# Patient Record
Sex: Female | Born: 1935
Health system: Southern US, Community
[De-identification: ages and names within clinical notes are randomized; demographics above are authoritative.]

## PROBLEM LIST (undated history)

## (undated) DIAGNOSIS — K219 Gastro-esophageal reflux disease without esophagitis: Secondary | ICD-10-CM

## (undated) DIAGNOSIS — R7302 Impaired glucose tolerance (oral): Secondary | ICD-10-CM

## (undated) DIAGNOSIS — E785 Hyperlipidemia, unspecified: Secondary | ICD-10-CM

## (undated) DIAGNOSIS — I639 Cerebral infarction, unspecified: Secondary | ICD-10-CM

## (undated) DIAGNOSIS — M1710 Unilateral primary osteoarthritis, unspecified knee: Secondary | ICD-10-CM

## (undated) DIAGNOSIS — M109 Gout, unspecified: Secondary | ICD-10-CM

## (undated) DIAGNOSIS — E739 Lactose intolerance, unspecified: Secondary | ICD-10-CM

## (undated) DIAGNOSIS — N959 Unspecified menopausal and perimenopausal disorder: Secondary | ICD-10-CM

## (undated) DIAGNOSIS — I10 Essential (primary) hypertension: Secondary | ICD-10-CM

## (undated) DIAGNOSIS — R509 Fever, unspecified: Secondary | ICD-10-CM

## (undated) DIAGNOSIS — Q019 Encephalocele, unspecified: Secondary | ICD-10-CM

## (undated) DIAGNOSIS — E7800 Pure hypercholesterolemia, unspecified: Secondary | ICD-10-CM

## (undated) DIAGNOSIS — J309 Allergic rhinitis, unspecified: Secondary | ICD-10-CM

## (undated) HISTORY — DX: Essential (primary) hypertension: I10

## (undated) HISTORY — DX: Encephalocele, unspecified: Q01.9

## (undated) HISTORY — DX: Pure hypercholesterolemia, unspecified: E78.00

## (undated) HISTORY — DX: Unspecified menopausal and perimenopausal disorder: N95.9

## (undated) HISTORY — DX: Impaired glucose tolerance (oral): R73.02

## (undated) HISTORY — DX: Lactose intolerance, unspecified: E73.9

## (undated) HISTORY — DX: Allergic rhinitis, unspecified: J30.9

## (undated) HISTORY — DX: Fever, unspecified: R50.9

## (undated) HISTORY — DX: Hyperlipidemia, unspecified: E78.5

## (undated) HISTORY — DX: Gastro-esophageal reflux disease without esophagitis: K21.9

## (undated) HISTORY — DX: Gout, unspecified: M10.9

## (undated) HISTORY — DX: Unilateral primary osteoarthritis, unspecified knee: M17.10

---

## 2001-01-01 ENCOUNTER — Inpatient Hospital Stay (HOSPITAL_COMMUNITY): Admission: EM | Admit: 2001-01-01 | Discharge: 2001-01-19 | Payer: Self-pay | Admitting: Emergency Medicine

## 2001-01-01 ENCOUNTER — Encounter: Payer: Self-pay | Admitting: Emergency Medicine

## 2001-01-02 ENCOUNTER — Encounter: Payer: Self-pay | Admitting: Neurology

## 2001-01-03 ENCOUNTER — Encounter: Payer: Self-pay | Admitting: Neurology

## 2001-01-04 ENCOUNTER — Encounter: Payer: Self-pay | Admitting: Critical Care Medicine

## 2001-01-04 ENCOUNTER — Encounter: Payer: Self-pay | Admitting: Neurology

## 2001-01-05 ENCOUNTER — Encounter: Payer: Self-pay | Admitting: Critical Care Medicine

## 2001-01-06 ENCOUNTER — Encounter: Payer: Self-pay | Admitting: Neurology

## 2001-01-07 ENCOUNTER — Encounter: Payer: Self-pay | Admitting: Critical Care Medicine

## 2001-01-08 ENCOUNTER — Encounter: Payer: Self-pay | Admitting: Neurology

## 2001-01-10 ENCOUNTER — Encounter: Payer: Self-pay | Admitting: Neurology

## 2001-01-12 ENCOUNTER — Encounter: Payer: Self-pay | Admitting: Neurology

## 2001-01-13 ENCOUNTER — Encounter (HOSPITAL_COMMUNITY): Payer: Self-pay | Admitting: Dentistry

## 2001-01-13 ENCOUNTER — Encounter: Payer: Self-pay | Admitting: Critical Care Medicine

## 2001-01-14 ENCOUNTER — Encounter: Payer: Self-pay | Admitting: Oral and Maxillofacial Surgery

## 2001-02-03 ENCOUNTER — Ambulatory Visit (HOSPITAL_COMMUNITY): Admission: RE | Admit: 2001-02-03 | Discharge: 2001-02-03 | Payer: Self-pay | Admitting: *Deleted

## 2001-02-03 ENCOUNTER — Encounter: Payer: Self-pay | Admitting: *Deleted

## 2001-04-13 HISTORY — PX: OTHER SURGICAL HISTORY: SHX169

## 2005-01-16 ENCOUNTER — Ambulatory Visit: Payer: Self-pay | Admitting: Internal Medicine

## 2005-01-27 ENCOUNTER — Ambulatory Visit: Payer: Self-pay | Admitting: Internal Medicine

## 2005-02-24 ENCOUNTER — Ambulatory Visit: Payer: Self-pay | Admitting: Internal Medicine

## 2006-01-12 LAB — HM MAMMOGRAPHY: HM Mammogram: NORMAL

## 2006-03-12 ENCOUNTER — Ambulatory Visit: Payer: Self-pay | Admitting: Internal Medicine

## 2006-03-23 ENCOUNTER — Ambulatory Visit: Payer: Self-pay | Admitting: Family Medicine

## 2006-04-14 ENCOUNTER — Ambulatory Visit: Payer: Self-pay | Admitting: Internal Medicine

## 2006-04-14 LAB — CONVERTED CEMR LAB
ALT: 23 units/L (ref 0–40)
AST: 24 units/L (ref 0–37)
Albumin: 3.4 g/dL — ABNORMAL LOW (ref 3.5–5.2)
Alkaline Phosphatase: 62 units/L (ref 39–117)
Bilirubin, Direct: 0.2 mg/dL (ref 0.0–0.3)
Chol/HDL Ratio, serum: 3.9
Cholesterol: 193 mg/dL (ref 0–200)
HDL: 49.2 mg/dL (ref >39.0)
LDL Cholesterol: 128 mg/dL — ABNORMAL HIGH (ref 0–99)
Total Bilirubin: 0.9 mg/dL (ref 0.3–1.2)
Total CK: 96 units/L (ref 7–177)
Total Protein: 7.4 g/dL (ref 6.0–8.3)
Triglyceride fasting, serum: 81 mg/dL (ref 0–149)
VLDL: 16 mg/dL (ref 0–40)

## 2006-05-20 ENCOUNTER — Ambulatory Visit: Payer: Self-pay | Admitting: Internal Medicine

## 2006-06-02 ENCOUNTER — Ambulatory Visit: Payer: Self-pay | Admitting: Internal Medicine

## 2006-06-02 LAB — CONVERTED CEMR LAB
BUN: 13 mg/dL (ref 6–23)
Creatinine, Ser: 0.8 mg/dL (ref 0.4–1.2)

## 2006-06-07 ENCOUNTER — Encounter: Admission: RE | Admit: 2006-06-07 | Discharge: 2006-06-07 | Payer: Self-pay | Admitting: Internal Medicine

## 2006-12-09 ENCOUNTER — Encounter: Payer: Self-pay | Admitting: Internal Medicine

## 2006-12-09 DIAGNOSIS — I10 Essential (primary) hypertension: Secondary | ICD-10-CM

## 2006-12-09 DIAGNOSIS — M109 Gout, unspecified: Secondary | ICD-10-CM

## 2006-12-09 DIAGNOSIS — J309 Allergic rhinitis, unspecified: Secondary | ICD-10-CM | POA: Insufficient documentation

## 2006-12-09 DIAGNOSIS — E7800 Pure hypercholesterolemia, unspecified: Secondary | ICD-10-CM | POA: Insufficient documentation

## 2006-12-09 DIAGNOSIS — K219 Gastro-esophageal reflux disease without esophagitis: Secondary | ICD-10-CM

## 2006-12-09 HISTORY — DX: Gout, unspecified: M10.9

## 2006-12-09 HISTORY — DX: Allergic rhinitis, unspecified: J30.9

## 2006-12-09 HISTORY — DX: Essential (primary) hypertension: I10

## 2006-12-09 HISTORY — DX: Pure hypercholesterolemia, unspecified: E78.00

## 2006-12-09 HISTORY — DX: Gastro-esophageal reflux disease without esophagitis: K21.9

## 2006-12-18 DIAGNOSIS — E785 Hyperlipidemia, unspecified: Secondary | ICD-10-CM

## 2006-12-18 HISTORY — DX: Hyperlipidemia, unspecified: E78.5

## 2007-01-21 ENCOUNTER — Ambulatory Visit: Payer: Self-pay | Admitting: Internal Medicine

## 2007-01-23 ENCOUNTER — Encounter: Payer: Self-pay | Admitting: Internal Medicine

## 2007-01-23 DIAGNOSIS — Q019 Encephalocele, unspecified: Secondary | ICD-10-CM

## 2007-01-23 DIAGNOSIS — IMO0002 Reserved for concepts with insufficient information to code with codable children: Secondary | ICD-10-CM | POA: Insufficient documentation

## 2007-01-23 DIAGNOSIS — M1710 Unilateral primary osteoarthritis, unspecified knee: Secondary | ICD-10-CM

## 2007-01-23 HISTORY — DX: Reserved for concepts with insufficient information to code with codable children: IMO0002

## 2007-01-23 HISTORY — DX: Encephalocele, unspecified: Q01.9

## 2007-02-16 LAB — CONVERTED CEMR LAB
ALT: 17 units/L (ref 0–35)
AST: 20 units/L (ref 0–37)
Albumin: 3.4 g/dL — ABNORMAL LOW (ref 3.5–5.2)
Alkaline Phosphatase: 64 units/L (ref 39–117)
BUN: 13 mg/dL (ref 6–23)
Basophils Absolute: 0.1 10*3/uL (ref 0.0–0.1)
Basophils Relative: 1 % (ref 0.0–1.0)
Bilirubin Urine: NEGATIVE
Bilirubin, Direct: 0.1 mg/dL (ref 0.0–0.3)
CO2: 30 meq/L (ref 19–32)
Calcium: 9.2 mg/dL (ref 8.4–10.5)
Chloride: 106 meq/L (ref 96–112)
Cholesterol: 251 mg/dL (ref 0–200)
Creatinine, Ser: 0.9 mg/dL (ref 0.4–1.2)
Crystals: NEGATIVE
Direct LDL: 177.5 mg/dL
Eosinophils Absolute: 0.3 10*3/uL (ref 0.0–0.6)
Eosinophils Relative: 5 % (ref 0.0–5.0)
GFR calc Af Amer: 78 mL/min
GFR calc non Af Amer: 65 mL/min
Glucose, Bld: 117 mg/dL — ABNORMAL HIGH (ref 70–99)
HCT: 40.4 % (ref 36.0–46.0)
HDL: 48 mg/dL (ref >39.0)
Hemoglobin, Urine: NEGATIVE
Hemoglobin: 13.7 g/dL (ref 12.0–15.0)
Ketones, ur: NEGATIVE mg/dL
Lymphocytes Relative: 35 % (ref 12.0–46.0)
MCHC: 33.8 g/dL (ref 30.0–36.0)
MCV: 86.5 fL (ref 78.0–100.0)
Monocytes Absolute: 0.7 10*3/uL (ref 0.2–0.7)
Monocytes Relative: 11.7 % — ABNORMAL HIGH (ref 3.0–11.0)
Mucus, UA: NEGATIVE
Neutro Abs: 2.9 10*3/uL (ref 1.4–7.7)
Neutrophils Relative %: 47.3 % (ref 43.0–77.0)
Nitrite: NEGATIVE
Platelets: 308 10*3/uL (ref 150–400)
Potassium: 3.7 meq/L (ref 3.5–5.1)
RBC: 4.67 M/uL (ref 3.87–5.11)
RDW: 13.9 % (ref 11.5–14.6)
Sodium: 142 meq/L (ref 135–145)
Specific Gravity, Urine: 1.01 (ref 1.000–1.03)
Squamous Epithelial / LPF: NEGATIVE /lpf
TSH: 2.2 microintl units/mL (ref 0.35–5.50)
Total Bilirubin: 1 mg/dL (ref 0.3–1.2)
Total CHOL/HDL Ratio: 5.2
Total Protein, Urine: NEGATIVE mg/dL
Total Protein: 7.5 g/dL (ref 6.0–8.3)
Triglycerides: 129 mg/dL (ref 0–149)
Urine Glucose: NEGATIVE mg/dL
Urobilinogen, UA: 0.2 (ref 0.0–1.0)
VLDL: 26 mg/dL (ref 0–40)
WBC: 6.1 10*3/uL (ref 4.5–10.5)
pH: 6 (ref 5.0–8.0)

## 2008-01-25 ENCOUNTER — Ambulatory Visit: Payer: Self-pay | Admitting: Internal Medicine

## 2008-01-25 DIAGNOSIS — N959 Unspecified menopausal and perimenopausal disorder: Secondary | ICD-10-CM

## 2008-01-25 HISTORY — DX: Unspecified menopausal and perimenopausal disorder: N95.9

## 2008-04-17 ENCOUNTER — Ambulatory Visit: Payer: Self-pay | Admitting: Internal Medicine

## 2008-04-17 ENCOUNTER — Encounter: Payer: Self-pay | Admitting: Internal Medicine

## 2009-01-24 ENCOUNTER — Ambulatory Visit: Payer: Self-pay | Admitting: Internal Medicine

## 2009-01-24 DIAGNOSIS — E739 Lactose intolerance, unspecified: Secondary | ICD-10-CM

## 2009-01-24 HISTORY — DX: Lactose intolerance, unspecified: E73.9

## 2009-01-24 LAB — HM COLONOSCOPY

## 2009-01-24 LAB — CONVERTED CEMR LAB
ALT: 19 units/L (ref 0–35)
AST: 21 units/L (ref 0–37)
Albumin: 3.4 g/dL — ABNORMAL LOW (ref 3.5–5.2)
Alkaline Phosphatase: 70 units/L (ref 39–117)
BUN: 16 mg/dL (ref 6–23)
Basophils Absolute: 0 10*3/uL (ref 0.0–0.1)
Basophils Relative: 0.5 % (ref 0.0–3.0)
Bilirubin Urine: NEGATIVE
Bilirubin, Direct: 0.1 mg/dL (ref 0.0–0.3)
CO2: 29 meq/L (ref 19–32)
Calcium: 9 mg/dL (ref 8.4–10.5)
Chloride: 102 meq/L (ref 96–112)
Cholesterol: 260 mg/dL — ABNORMAL HIGH (ref 0–200)
Creatinine, Ser: 1 mg/dL (ref 0.4–1.2)
Direct LDL: 187.4 mg/dL
Eosinophils Absolute: 0.3 10*3/uL (ref 0.0–0.7)
Eosinophils Relative: 4.2 % (ref 0.0–5.0)
GFR calc non Af Amer: 68.89 mL/min (ref >60)
Glucose, Bld: 108 mg/dL — ABNORMAL HIGH (ref 70–99)
HCT: 42.6 % (ref 36.0–46.0)
HDL: 45.8 mg/dL (ref >39.00)
Hemoglobin: 13.9 g/dL (ref 12.0–15.0)
Hgb A1c MFr Bld: 6.3 % (ref 4.6–6.5)
Ketones, ur: NEGATIVE mg/dL
Leukocytes, UA: NEGATIVE
Lymphocytes Relative: 34.4 % (ref 12.0–46.0)
Lymphs Abs: 2.1 10*3/uL (ref 0.7–4.0)
MCHC: 32.7 g/dL (ref 30.0–36.0)
MCV: 88.1 fL (ref 78.0–100.0)
Monocytes Absolute: 0.7 10*3/uL (ref 0.1–1.0)
Monocytes Relative: 11 % (ref 3.0–12.0)
Neutro Abs: 2.9 10*3/uL (ref 1.4–7.7)
Neutrophils Relative %: 49.9 % (ref 43.0–77.0)
Nitrite: NEGATIVE
Platelets: 239 10*3/uL (ref 150.0–400.0)
Potassium: 4.3 meq/L (ref 3.5–5.1)
RBC: 4.83 M/uL (ref 3.87–5.11)
RDW: 14 % (ref 11.5–14.6)
Sodium: 139 meq/L (ref 135–145)
Specific Gravity, Urine: 1.025 (ref 1.000–1.030)
TSH: 2.43 microintl units/mL (ref 0.35–5.50)
Total Bilirubin: 1.1 mg/dL (ref 0.3–1.2)
Total CHOL/HDL Ratio: 6
Total Protein, Urine: NEGATIVE mg/dL
Total Protein: 7.3 g/dL (ref 6.0–8.3)
Triglycerides: 148 mg/dL (ref 0.0–149.0)
Uric Acid, Serum: 7.3 mg/dL — ABNORMAL HIGH (ref 2.4–7.0)
Urine Glucose: NEGATIVE mg/dL
Urobilinogen, UA: 0.2 (ref 0.0–1.0)
VLDL: 29.6 mg/dL (ref 0.0–40.0)
WBC: 6 10*3/uL (ref 4.5–10.5)
pH: 5.5 (ref 5.0–8.0)

## 2009-06-17 ENCOUNTER — Encounter (INDEPENDENT_AMBULATORY_CARE_PROVIDER_SITE_OTHER): Payer: Self-pay | Admitting: *Deleted

## 2009-07-26 ENCOUNTER — Ambulatory Visit: Payer: Self-pay | Admitting: Internal Medicine

## 2009-07-26 LAB — CONVERTED CEMR LAB
BUN: 17 mg/dL (ref 6–23)
CO2: 30 meq/L (ref 19–32)
Calcium: 9.6 mg/dL (ref 8.4–10.5)
Chloride: 102 meq/L (ref 96–112)
Cholesterol: 253 mg/dL — ABNORMAL HIGH (ref 0–200)
Creatinine, Ser: 1 mg/dL (ref 0.4–1.2)
Direct LDL: 173.5 mg/dL
GFR calc non Af Amer: 68.8 mL/min (ref >60)
Glucose, Bld: 108 mg/dL — ABNORMAL HIGH (ref 70–99)
HDL: 53.6 mg/dL (ref >39.00)
Hgb A1c MFr Bld: 6.2 % (ref 4.6–6.5)
Potassium: 4 meq/L (ref 3.5–5.1)
Sodium: 141 meq/L (ref 135–145)
Total CHOL/HDL Ratio: 5
Triglycerides: 153 mg/dL — ABNORMAL HIGH (ref 0.0–149.0)
VLDL: 30.6 mg/dL (ref 0.0–40.0)

## 2010-01-21 ENCOUNTER — Ambulatory Visit: Payer: Self-pay | Admitting: Internal Medicine

## 2010-01-28 ENCOUNTER — Ambulatory Visit: Payer: Self-pay | Admitting: Internal Medicine

## 2010-01-28 DIAGNOSIS — R509 Fever, unspecified: Secondary | ICD-10-CM | POA: Insufficient documentation

## 2010-01-28 HISTORY — DX: Fever, unspecified: R50.9

## 2010-01-28 LAB — CONVERTED CEMR LAB
ALT: 13 units/L (ref 0–35)
AST: 17 units/L (ref 0–37)
Albumin: 3.3 g/dL — ABNORMAL LOW (ref 3.5–5.2)
Alkaline Phosphatase: 59 units/L (ref 39–117)
BUN: 14 mg/dL (ref 6–23)
Basophils Absolute: 0.1 10*3/uL (ref 0.0–0.1)
Basophils Relative: 1.1 % (ref 0.0–3.0)
Bilirubin Urine: NEGATIVE
Bilirubin, Direct: 0.1 mg/dL (ref 0.0–0.3)
CO2: 31 meq/L (ref 19–32)
Calcium: 9.1 mg/dL (ref 8.4–10.5)
Chloride: 105 meq/L (ref 96–112)
Cholesterol: 245 mg/dL — ABNORMAL HIGH (ref 0–200)
Creatinine, Ser: 0.8 mg/dL (ref 0.4–1.2)
Direct LDL: 166.2 mg/dL
Eosinophils Absolute: 0.3 10*3/uL (ref 0.0–0.7)
Eosinophils Relative: 4.7 % (ref 0.0–5.0)
GFR calc non Af Amer: 85.19 mL/min (ref >60)
Glucose, Bld: 106 mg/dL — ABNORMAL HIGH (ref 70–99)
HCT: 39.7 % (ref 36.0–46.0)
HDL: 54 mg/dL (ref >39.00)
Hemoglobin, Urine: NEGATIVE
Hemoglobin: 13 g/dL (ref 12.0–15.0)
Hgb A1c MFr Bld: 6.5 % (ref 4.6–6.5)
Ketones, ur: NEGATIVE mg/dL
Lymphocytes Relative: 34.8 % (ref 12.0–46.0)
Lymphs Abs: 2 10*3/uL (ref 0.7–4.0)
MCHC: 32.7 g/dL (ref 30.0–36.0)
MCV: 87.8 fL (ref 78.0–100.0)
Monocytes Absolute: 0.7 10*3/uL (ref 0.1–1.0)
Monocytes Relative: 12.6 % — ABNORMAL HIGH (ref 3.0–12.0)
Neutro Abs: 2.7 10*3/uL (ref 1.4–7.7)
Neutrophils Relative %: 46.8 % (ref 43.0–77.0)
Nitrite: NEGATIVE
Platelets: 264 10*3/uL (ref 150.0–400.0)
Potassium: 3.8 meq/L (ref 3.5–5.1)
RBC: 4.52 M/uL (ref 3.87–5.11)
RDW: 15.5 % — ABNORMAL HIGH (ref 11.5–14.6)
Sodium: 143 meq/L (ref 135–145)
Specific Gravity, Urine: 1.02 (ref 1.000–1.030)
TSH: 1.84 microintl units/mL (ref 0.35–5.50)
Total Bilirubin: 0.9 mg/dL (ref 0.3–1.2)
Total CHOL/HDL Ratio: 5
Total Protein, Urine: NEGATIVE mg/dL
Total Protein: 6.6 g/dL (ref 6.0–8.3)
Triglycerides: 131 mg/dL (ref 0.0–149.0)
Urine Glucose: NEGATIVE mg/dL
Urobilinogen, UA: 0.2 (ref 0.0–1.0)
VLDL: 26.2 mg/dL (ref 0.0–40.0)
WBC: 5.8 10*3/uL (ref 4.5–10.5)
pH: 6 (ref 5.0–8.0)

## 2010-05-04 ENCOUNTER — Encounter: Payer: Self-pay | Admitting: Internal Medicine

## 2010-05-11 LAB — CONVERTED CEMR LAB
ALT: 21 units/L (ref 0–35)
AST: 23 units/L (ref 0–37)
Albumin: 3.6 g/dL (ref 3.5–5.2)
Alkaline Phosphatase: 69 units/L (ref 39–117)
BUN: 14 mg/dL (ref 6–23)
Basophils Absolute: 0 10*3/uL (ref 0.0–0.1)
Basophils Relative: 0.8 % (ref 0.0–3.0)
Bilirubin Urine: NEGATIVE
Bilirubin, Direct: 0.2 mg/dL (ref 0.0–0.3)
CO2: 30 meq/L (ref 19–32)
Calcium: 9.4 mg/dL (ref 8.4–10.5)
Chloride: 103 meq/L (ref 96–112)
Cholesterol: 249 mg/dL (ref 0–200)
Creatinine, Ser: 0.9 mg/dL (ref 0.4–1.2)
Crystals: NEGATIVE
Direct LDL: 153 mg/dL
Eosinophils Absolute: 0.2 10*3/uL (ref 0.0–0.7)
Eosinophils Relative: 4.1 % (ref 0.0–5.0)
GFR calc Af Amer: 78 mL/min
GFR calc non Af Amer: 65 mL/min
Glucose, Bld: 114 mg/dL — ABNORMAL HIGH (ref 70–99)
HCT: 43.6 % (ref 36.0–46.0)
HDL: 47 mg/dL (ref >39.0)
Hemoglobin, Urine: NEGATIVE
Hemoglobin: 14.7 g/dL (ref 12.0–15.0)
Ketones, ur: NEGATIVE mg/dL
Lymphocytes Relative: 38.5 % (ref 12.0–46.0)
MCHC: 33.7 g/dL (ref 30.0–36.0)
MCV: 87.9 fL (ref 78.0–100.0)
Monocytes Absolute: 0.7 10*3/uL (ref 0.1–1.0)
Monocytes Relative: 11 % (ref 3.0–12.0)
Mucus, UA: NEGATIVE
Neutro Abs: 2.8 10*3/uL (ref 1.4–7.7)
Neutrophils Relative %: 45.6 % (ref 43.0–77.0)
Nitrite: NEGATIVE
Platelets: 267 10*3/uL (ref 150–400)
Potassium: 3.7 meq/L (ref 3.5–5.1)
RBC / HPF: NONE SEEN
RBC: 4.96 M/uL (ref 3.87–5.11)
RDW: 13.9 % (ref 11.5–14.6)
Sodium: 140 meq/L (ref 135–145)
Specific Gravity, Urine: 1.01 (ref 1.000–1.03)
Squamous Epithelial / LPF: NEGATIVE /lpf
TSH: 2.09 microintl units/mL (ref 0.35–5.50)
Total Bilirubin: 1 mg/dL (ref 0.3–1.2)
Total CHOL/HDL Ratio: 5.3
Total Protein, Urine: NEGATIVE mg/dL
Total Protein: 7.9 g/dL (ref 6.0–8.3)
Triglycerides: 147 mg/dL (ref 0–149)
Urine Glucose: NEGATIVE mg/dL
Urobilinogen, UA: 0.2 (ref 0.0–1.0)
VLDL: 29 mg/dL (ref 0–40)
Vit D, 1,25-Dihydroxy: 19 — ABNORMAL LOW (ref 30–89)
WBC: 6 10*3/uL (ref 4.5–10.5)
pH: 5 (ref 5.0–8.0)

## 2010-05-15 NOTE — Assessment & Plan Note (Signed)
Vital Signs:  Patient Profile:   77 Years Old Female Weight:      266 pounds Temp:     99.4 degrees F Pulse rate:   69 / minute BP sitting:   158 / 88  Vitals Entered By: Corwin Levins MD (January 23, 2007 9:54 PM)                 Preventive Care Screening  Mammogram:    Date:  01/12/2006    Next Due:  01/2007    Results:  normal     History of Present Illness: BP at home 133-140 per pt, only occasionally higher; not taking the pravachol - ?why; c/o left knee swelling and mild discomfort, o/w doing well  Current Allergies (reviewed today): ! CODEINE ! MEVACOR ! ZOCOR Updated/Current Medications (including changes made in today's visit):  ECOTRIN 325 MG  TBEC (ASPIRIN) 1 by mouth qd BENICAR 40 MG  TABS (OLMESARTAN MEDOXOMIL) 1 by mouth bid CARVEDILOL 25 MG  TABS (CARVEDILOL) 1 by mouth bid FUROSEMIDE 40 MG  TABS (FUROSEMIDE) 1 by mouth qd PRAVACHOL 40 MG  TABS (PRAVASTATIN SODIUM) 2 by mouth qd   Past Medical History:    Reviewed history from 12/09/2006 and no changes required:       Allergic rhinitis       GERD       Gout       Hypertension       Hyperlipidemia       knee djd  Past Surgical History:    Reviewed history and no changes required:       s/p anterior encephalocele repair and craniotomy - 2003   Family History:    Reviewed history and no changes required:       Family History Hypertension  Social History:    Reviewed history and no changes required:       Never Smoked       Alcohol use-no   Risk Factors:  Tobacco use:  never Alcohol use:  no  Mammogram History:     Date of Last Mammogram:  01/12/2006    Results:  normal    Review of Systems  The patient denies anorexia, fever, weight loss, vision loss, decreased hearing, hoarseness, chest pain, syncope, dyspnea on exhertion, peripheral edema, prolonged cough, hemoptysis, abdominal pain, melena, hematochezia, severe indigestion/heartburn, hematuria, incontinence, genital sores,  muscle weakness, suspicious skin lesions, transient blindness, difficulty walking, depression, unusual weight change, abnormal bleeding, enlarged lymph nodes, angioedema, breast masses, and testicular masses.     Physical Exam  General:     overweight-appearing.   Head:     Normocephalic and atraumatic without obvious abnormalities. No apparent alopecia or balding. Eyes:     No corneal or conjunctival inflammation noted. EOMI. Perrla. Ears:     External ear exam shows no significant lesions or deformities.  Otoscopic examination reveals clear canals, tympanic membranes are intact bilaterally without bulging, retraction, inflammation or discharge. Hearing is grossly normal bilaterally. Nose:     External nasal examination shows no deformity or inflammation. Nasal mucosa are pink and moist without lesions or exudates. Mouth:     Oral mucosa and oropharynx without lesions or exudates.  Teeth in good repair. Neck:     No deformities, masses, or tenderness noted. Lungs:     Normal respiratory effort, chest expands symmetrically. Lungs are clear to auscultation, no crackles or wheezes. Heart:     Normal rate and regular rhythm. S1 and S2 normal  without gallop, murmur, click, rub or other extra sounds. Abdomen:     Bowel sounds positive,abdomen soft and non-tender without masses, organomegaly or hernias noted. Msk:     left knee small effusion and creitus noted Extremities:     No clubbing, cyanosis, edema, or deformity noted with normal full range of motion of all joints.   Neurologic:     No cranial nerve deficits noted. Station and gait are normal. Plantar reflexes are down-going bilaterally. DTRs are symmetrical throughout. Sensory, motor and coordinative functions appear intact. Skin:     Intact without suspicious lesions or rashes    Impression & Recommendations:  Problem # 1:  PREVENTIVE HEALTH CARE (ICD-V70.0) Assessment: New overall doing well, up to date, counseled on  routine health matters, will check routine labs today  Problem # 2:  HYPERTENSION (ICD-401.9) Assessment: Unchanged  Her updated medication list for this problem includes:    Benicar 40 Mg Tabs (Olmesartan medoxomil) .Marland Kitchen... 1 by mouth bid    Carvedilol 25 Mg Tabs (Carvedilol) .Marland Kitchen... 1 by mouth bid    Furosemide 40 Mg Tabs (Furosemide) .Marland Kitchen... 1 by mouth once daily  cont same meds  Her updated medication list for this problem includes:    Benicar 40 Mg Tabs (Olmesartan medoxomil) .Marland Kitchen... 1 by mouth bid    Carvedilol 25 Mg Tabs (Carvedilol) .Marland Kitchen... 1 by mouth bid    Furosemide 40 Mg Tabs (Furosemide) .Marland Kitchen... 1 by mouth qd   Problem # 3:  DEGENERATIVE JOINT DISEASE, LEFT KNEE (ICD-715.96)  Her updated medication list for this problem includes:    Ecotrin 325 Mg Tbec (Aspirin) .Marland Kitchen... 1 by mouth once daily  cont meds as is, tylenol prn  Her updated medication list for this problem includes:    Ecotrin 325 Mg Tbec (Aspirin) .Marland Kitchen... 1 by mouth qd   Complete Medication List: 1)  Ecotrin 325 Mg Tbec (Aspirin) .Marland Kitchen.. 1 by mouth qd 2)  Benicar 40 Mg Tabs (Olmesartan medoxomil) .Marland Kitchen.. 1 by mouth bid 3)  Carvedilol 25 Mg Tabs (Carvedilol) .Marland Kitchen.. 1 by mouth bid 4)  Furosemide 40 Mg Tabs (Furosemide) .Marland Kitchen.. 1 by mouth qd 5)  Pravachol 40 Mg Tabs (Pravastatin sodium) .... 2 by mouth qd   Patient Instructions: 1)  Please schedule a follow-up appointment in 4 months.    ]

## 2010-05-15 NOTE — Assessment & Plan Note (Signed)
Summary: FU---$50---STC   Vital Signs:  Patient Profile:   77 Years Old Female Weight:      251 pounds Temp:     99.8 degrees F oral Pulse rate:   80 / minute BP sitting:   140 / 82  (left arm) Cuff size:   large  Vitals Entered By: Payton Spark CMA (January 25, 2008 9:23 AM)                 Preventive Care Screening  Bone Density:    Date:  03/25/2006    Next Due:  03/2008    Results:  abnormal std dev  Colonoscopy:    Date:  01/25/2008    Results:  declined  Last Pneumovax:    Next Due:  03/2011  Last Flu Shot:    Date:  01/25/2008    Results:  Fluvax Non-MCR  Last Tetanus Booster:    Date:  04/14/1995    Results:  given    Chief Complaint:  f/u.  History of Present Illness: wont use her cane, but no falls or other complaints, not taking the statin as much lately    Updated Prior Medication List: ECOTRIN 325 MG  TBEC (ASPIRIN) 1 by mouth qd BENICAR 40 MG  TABS (OLMESARTAN MEDOXOMIL) 1 by mouth once daily CARVEDILOL 25 MG  TABS (CARVEDILOL) 1 by mouth two times a day FUROSEMIDE 40 MG  TABS (FUROSEMIDE) 1 by mouth once daily PRAVACHOL 40 MG  TABS (PRAVASTATIN SODIUM) 2 by mouth once daily  Current Allergies (reviewed today): ! CODEINE ! MEVACOR ! ZOCOR  Past Medical History:    Reviewed history from 01/23/2007 and no changes required:       Allergic rhinitis       GERD       Gout       Hypertension       Hyperlipidemia       knee djd  Past Surgical History:    Reviewed history from 01/23/2007 and no changes required:       s/p anterior encephalocele repair and craniotomy - 2003   Family History:    Reviewed history from 01/23/2007 and no changes required:       Family History Hypertension  Social History:    Reviewed history from 01/23/2007 and no changes required:       Never Smoked       Alcohol use-no       Married       7 children       work - Marketing executive stylist   Risk Factors:  Colonoscopy History:     Date of  Last Colonoscopy:  01/25/2008    Results:  declined   Review of Systems  The patient denies anorexia, fever, weight loss, weight gain, vision loss, decreased hearing, hoarseness, chest pain, syncope, dyspnea on exertion, peripheral edema, prolonged cough, headaches, hemoptysis, abdominal pain, melena, hematochezia, severe indigestion/heartburn, hematuria, incontinence, muscle weakness, suspicious skin lesions, transient blindness, difficulty walking, depression, unusual weight change, abnormal bleeding, enlarged lymph nodes, angioedema, and breast masses.         all otherwise negative  - just puts up with the knee pains - overall stable ; conts to decline the colonoscopy   Physical Exam  General:     alert and overweight-appearing.   Head:     Normocephalic and atraumatic without obvious abnormalities. No apparent alopecia or balding. Eyes:     No corneal or conjunctival inflammation noted. EOMI. Perrla. Ears:  External ear exam shows no significant lesions or deformities.  Otoscopic examination reveals clear canals, tympanic membranes are intact bilaterally without bulging, retraction, inflammation or discharge. Hearing is grossly normal bilaterally. Nose:     External nasal examination shows no deformity or inflammation. Nasal mucosa are pink and moist without lesions or exudates. Mouth:     Oral mucosa and oropharynx without lesions or exudates.  Teeth in good repair. Neck:     No deformities, masses, or tenderness noted. Lungs:     Normal respiratory effort, chest expands symmetrically. Lungs are clear to auscultation, no crackles or wheezes. Heart:     Normal rate and regular rhythm. S1 and S2 normal without gallop, murmur, click, rub or other extra sounds. Abdomen:     Bowel sounds positive,abdomen soft and non-tender without masses, organomegaly or hernias noted. Msk:     no joint tenderness and no joint swelling.  , although there is knee crepitus Extremities:     no  edema, no ulcers  Neurologic:     cranial nerves II-XII intact and strength normal in all extremities.      Impression & Recommendations:  Problem # 1:  Preventive Health Care (ICD-V70.0) Overall doing well, up to date, counseled on routine health concerns for screening and prevention, immunizations up to date or declined, labs ordered, ecg reviewed, to get flu shot today, to schedule DXA wheh she is due, declines colonoscopy again   Orders: EKG w/ Interpretation (93000) T-Vitamin D (25-Hydroxy) (16[address]-60454) TLB-Udip ONLY (81003-UDIP) TLB-BMP (Basic Metabolic Panel-BMET) (80048-METABOL) TLB-CBC Platelet - w/Differential (85025-CBCD) TLB-Hepatic/Liver Function Pnl (80076-HEPATIC) TLB-Lipid Panel (80061-LIPID) TLB-TSH (Thyroid Stimulating Hormone) (84443-TSH) TLB-Udip w/ Micro (81001-URINE)    Problem # 2:  HYPERTENSION (ICD-401.9)  Her updated medication list for this problem includes:    Benicar 40 Mg Tabs (Olmesartan medoxomil) .Marland Kitchen... 1 by mouth once daily    Carvedilol 25 Mg Tabs (Carvedilol) .Marland Kitchen... 1 by mouth two times a day    Furosemide 40 Mg Tabs (Furosemide) .Marland Kitchen... 1 by mouth once daily  BP today: 140/82 Prior BP: 158/88 (01/23/2007)  Labs Reviewed: Creat: 0.9 (01/21/2007) Chol: 251 (01/21/2007)   HDL: 48.0 (01/21/2007)   LDL: DEL (01/21/2007)   TG: 129 (01/21/2007) stable overall by hx and exam, ok to continue meds/tx as is   Problem # 3:  HYPERLIPIDEMIA (ICD-272.4)  Her updated medication list for this problem includes:    Pravachol 40 Mg Tabs (Pravastatin sodium) .Marland Kitchen... 2 by mouth once daily check lipids  - goal ldl   Complete Medication List: 1)  Ecotrin 325 Mg Tbec (Aspirin) .Marland Kitchen.. 1 by mouth qd 2)  Benicar 40 Mg Tabs (Olmesartan medoxomil) .Marland Kitchen.. 1 by mouth once daily 3)  Carvedilol 25 Mg Tabs (Carvedilol) .Marland Kitchen.. 1 by mouth two times a day 4)  Furosemide 40 Mg Tabs (Furosemide) .Marland Kitchen.. 1 by mouth once daily 5)  Pravachol 40 Mg Tabs (Pravastatin sodium) .... 2 by  mouth once daily  Other Orders: Influenza Vaccine NON MCR (09811) T-Bone Densitometry [phone])   Patient Instructions: 1)  your EKG today was good 2)  you received the flu shot today 3)  Please go to the Lab in the basement for your blood tests today 4)  Continue all medications that you may have been taking previously (they were sent to the pharmacy on the computer) 5)  please schedule the next bone density test for Jan 2010 when you are due 6)  please call for your yearly mammogram 7)  Please schedule a follow-up  appointment in 1 year or sooner if needed   Prescriptions: PRAVACHOL 40 MG  TABS (PRAVASTATIN SODIUM) 2 by mouth once daily  #60 x 11   Entered and Authorized by:   Corwin Levins MD   Signed by:   Corwin Levins MD on 01/25/2008   Method used:   Electronically to        [facility] (retail)       [address]       [place], [place]  [address]       Ph: [phone]       Fax: [phone]   RxID:   [id number] FUROSEMIDE 40 MG  TABS (FUROSEMIDE) 1 by mouth once daily  #30 x 11   Entered and Authorized by:   Corwin Levins MD   Signed by:   Corwin Levins MD on 01/25/2008   Method used:   Electronically to        [facility] (retail)       [address]       [place], [place]  [address]       Ph: [phone]       Fax: [phone]   RxID:   [id number] CARVEDILOL 25 MG  TABS (CARVEDILOL) 1 by mouth two times a day  #60 x 11   Entered and Authorized by:   Corwin Levins MD   Signed by:   Corwin Levins MD on 01/25/2008   Method used:   Electronically to        [facility] (retail)       [address]       [place], [place]  [address]       Ph: [phone]       Fax: [phone]   RxID:   [id number] BENICAR 40 MG  TABS (OLMESARTAN MEDOXOMIL) 1 by mouth once daily  #30 x 11   Entered and Authorized by:   Corwin Levins MD   Signed by:   Corwin Levins MD on 01/25/2008    Method used:   Electronically to        [facility] (retail)       [address]       [place], [place]  [address]       Ph: [phone]       Fax: [phone]   RxID:   [id number]  ]  Influenza Vaccine    Vaccine Type: Fluvax Non-MCR    Site: left deltoid    Mfr: GlaxoSmithKline    Dose: 0.5 ml    Route: IM    Given by: Payton Spark CMA    Exp. Date: 10/10/2008    Lot #: [id number]    VIS given: 11/04/06 version given January 25, 2008.  Flu Vaccine Consent Questions    Do you have a history of severe allergic reactions to this vaccine? no    Any prior history of allergic reactions to egg and/or gelatin? no    Do you have a sensitivity to the preservative Thimersol? no    Do you have a past history of Guillan-Barre Syndrome? no    Do you currently have an acute febrile illness? no    Have you ever had a severe reaction  to latex? no    Vaccine information given and explained to patient? yes    Are you currently pregnant? no

## 2010-05-15 NOTE — Assessment & Plan Note (Signed)
Summary: FU Natale Milch  $50   Vital Signs:  Patient profile:   77 year old female Height:      60 inches Weight:      250.25 pounds BMI:     49.05 O2 Sat:      96 % Temp:     97.3 degrees F oral Pulse rate:   81 / minute BP sitting:   201 / 102  (left arm) Cuff size:   large  Preventive Care Screening  Bone Density:    Date:  04/17/2008    Next Due:  04/2010    Results:  abnormal std dev  Colonoscopy:    Date:  01/24/2009    Results:  declined  CC: F/U   CC:  F/U.  History of Present Illness: here with more stress lately - soninlaw just told her recently her daughter is suicidal with hx of bipolar (now on suicide watch at [facility] health);  wt overall stable;  BP recently has been < 140/90;  Pt denies CP, sob, doe, wheezing, orthopnea, pnd, worsening LE edema, palps, dizziness or syncope Pt denies new neuro symptoms such as headache, facial or extremity weakness  .  Overall doing well, good complaince iwth meds.    Problems Prior to Update: 1)  Glucose Intolerance  (ICD-271.3) 2)  Menopausal Disorder  (ICD-627.9) 3)  Preventive Health Care  (ICD-V70.0) 4)  Preventive Health Care  (ICD-V70.0) 5)  Encephalocele  (ICD-742.0) 6)  Degenerative Joint Disease, Left Knee  (ICD-715.96) 7)  Hyperlipidemia  (ICD-272.4) 8)  Hypertension  (ICD-401.9) 9)  Gout  (ICD-274.9) 10)  Gerd  (ICD-530.81) 11)  Allergic Rhinitis  (ICD-477.9) 12)  Hypercholesterolemia  (ICD-272.0)  Medications Prior to Update: 1)  Ecotrin 325 Mg  Tbec (Aspirin) .Marland Kitchen.. 1 By Mouth Qd 2)  Benicar 40 Mg  Tabs (Olmesartan Medoxomil) .Marland Kitchen.. 1 By Mouth Once Daily 3)  Carvedilol 25 Mg  Tabs (Carvedilol) .Marland Kitchen.. 1 By Mouth Two Times A Day 4)  Furosemide 40 Mg  Tabs (Furosemide) .Marland Kitchen.. 1 By Mouth Once Daily 5)  Pravachol 40 Mg  Tabs (Pravastatin Sodium) .... 2 By Mouth Once Daily  Current Medications (verified): 1)  Ecotrin 325 Mg  Tbec (Aspirin) .Marland Kitchen.. 1 By Mouth Qd 2)  Benazepril Hcl 40 Mg Tabs (Benazepril Hcl) .Marland Kitchen.. 1po Once  Daily 3)  Carvedilol 25 Mg  Tabs (Carvedilol) .Marland Kitchen.. 1 By Mouth Two Times A Day 4)  Furosemide 40 Mg  Tabs (Furosemide) .Marland Kitchen.. 1 By Mouth Once Daily 5)  Pravachol 40 Mg  Tabs (Pravastatin Sodium) .... 2 By Mouth Once Daily 6)  Amlodipine Besylate 5 Mg Tabs (Amlodipine Besylate) .Marland Kitchen.. 1 By Mouth Once Daily  Allergies (verified): 1)  ! Codeine 2)  ! Mevacor 3)  ! 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  Risk Factors: Smoking Status: never (01/23/2007)  Past Medical History: Allergic rhinitis GERD Gout Hypertension Hyperlipidemia knee djd glucose intolerance  Review of Systems  The patient denies anorexia, fever, weight loss, weight gain, vision loss, decreased hearing, hoarseness, chest pain, syncope, dyspnea on exertion, peripheral edema, prolonged cough, headaches, hemoptysis, abdominal pain, melena, hematochezia, severe indigestion/heartburn, hematuria, incontinence, muscle weakness, suspicious skin lesions, transient blindness, difficulty walking, depression, unusual weight change, abnormal bleeding, and enlarged lymph nodes.  all otherwise negative per pt  Physical Exam  General:  alert and overweight-appearing.   Head:  normocephalic and atraumatic.   Eyes:  vision grossly intact, pupils equal, and pupils round.   Ears:  R ear normal and L ear normal.   Nose:  no external deformity and no nasal discharge.   Mouth:  no gingival abnormalities and pharynx pink and moist.   Neck:  supple and no masses.   Lungs:  normal respiratory effort and normal breath sounds.   Heart:  normal rate and regular rhythm.   Abdomen:  soft, non-tender, and normal bowel sounds.   Msk:  no joint  tenderness and no joint swelling.   Extremities:  no edema, no erythema  Neurologic:  cranial nerves II-XII intact and strength normal in all extremities.     Impression & Recommendations:  Problem # 1:  Preventive Health Care (ICD-V70.0) Overall doing well, up to date, counseled on routine health concerns for screening and prevention, immunizations up to date or declined, labs ordered  Orders: TLB-BMP (Basic Metabolic Panel-BMET) (80048-METABOL) TLB-CBC Platelet - w/Differential (85025-CBCD) TLB-Hepatic/Liver Function Pnl (80076-HEPATIC) TLB-Lipid Panel (80061-LIPID) TLB-TSH (Thyroid Stimulating Hormone) (84443-TSH) TLB-Udip ONLY (81003-UDIP)  Problem # 2:  GLUCOSE INTOLERANCE (ICD-271.3) asympt - to check a1c Orders: TLB-A1C / Hgb A1C (Glycohemoglobin) (83036-A1C)  Problem # 3:  HYPERTENSION (ICD-401.9)  Her updated medication list for this problem includes:    Benazepril Hcl 40 Mg Tabs (Benazepril hcl) .Marland Kitchen... 1po once daily    Carvedilol 25 Mg Tabs (Carvedilol) .Marland Kitchen... 1 by mouth two times a day    Furosemide 40 Mg Tabs (Furosemide) .Marland Kitchen... 1 by mouth once daily    Amlodipine Besylate 5 Mg Tabs (Amlodipine besylate) .Marland Kitchen... 1 by mouth once daily BP at home on talking to the pt again, is about 150 she says - ok to add admlodipine 5 once daily   Complete Medication List: 1)  Ecotrin 325 Mg Tbec (Aspirin) .Marland Kitchen.. 1 by mouth qd 2)  Benazepril Hcl 40 Mg Tabs (Benazepril hcl) .Marland Kitchen.. 1po once daily 3)  Carvedilol 25 Mg Tabs (Carvedilol) .Marland Kitchen.. 1 by mouth two times a day 4)  Furosemide 40 Mg Tabs (Furosemide) .Marland Kitchen.. 1 by mouth once daily 5)  Pravachol 40 Mg Tabs (Pravastatin sodium) .... 2 by mouth once daily 6)  Amlodipine Besylate 5 Mg Tabs (Amlodipine besylate) .Marland Kitchen.. 1 by mouth once daily  Other Orders: Flu Vaccine 59yrs + (30865) Administration Flu vaccine - MCR (G0008) TD Toxoids IM 7 YR + (78469) Admin 1st Vaccine (62952) Admin of Any Addtl Vaccine (84132) TLB-Uric Acid, Blood  (84550-URIC)  Patient Instructions: 1)  you had the flu shot today, and the tetanus shot today 2)  plesae call for your yearly mammogram 3)  Please go to the Lab in the basement for your blood and/or urine tests today  4)  start the amlodipine 5 mg per day 5)  Continue all previous medications as before this visit  6)  Please schedule a follow-up appointment in 6 months with : 7)  BMP prior to visit, ICD-9: 790.2 8)  Lipid Panel prior to visit, ICD-9: 9)  HbgA1C prior to visit, ICD-9: 10)  Check your Blood Pressure regularly. If it is above 140/90: you should make an appointment. Prescriptions: AMLODIPINE BESYLATE 5 MG TABS (AMLODIPINE BESYLATE) 1 by mouth once daily  #90 x 3   Entered and Authorized by:   Corwin Levins MD   Signed by:   Corwin Levins MD on 01/24/2009  Method used:   [facility] to        [facility]       [address]       [place], [place]  [address]       Ph: [phone]       Fax: [phone]   RxID:   [id number] AMLODIPINE BESYLATE 5 MG TABS (AMLODIPINE BESYLATE) 1 by mouth once daily  #30 x 11   Entered and Authorized by:   Corwin Levins MD   Signed by:   Corwin Levins MD on 01/24/2009   Method used:   Print then Give to Patient   RxID:   [id number] PRAVACHOL 40 MG  TABS (PRAVASTATIN SODIUM) 2 by mouth once daily  #180 x 3   Entered and Authorized by:   Corwin Levins MD   Signed by:   Corwin Levins MD on 01/24/2009   Method used:   Electronically to        [facility] (retail)       [address]       [place], [place]  [address]       Ph: [phone]       Fax: [phone]   RxID:   [id number] FUROSEMIDE 40 MG  TABS (FUROSEMIDE) 1 by mouth once daily  #90 x 3   Entered and Authorized by:   Corwin Levins MD   Signed by:   Corwin Levins MD on 01/24/2009   Method used:   Electronically to        [facility] (retail)       [address]       [place], [place]  [address]       Ph: [phone]       Fax: [phone]   RxID:   [id number] CARVEDILOL 25 MG  TABS (CARVEDILOL) 1 by mouth two times a day  #180 x 3   Entered and Authorized by:   Corwin Levins MD   Signed by:   Corwin Levins MD on 01/24/2009   Method used:   Electronically to        [facility] (retail)       [address]       [place], [place]  [address]       Ph: [phone]       Fax: [phone]   RxID:   [id number] BENAZEPRIL HCL 40 MG TABS (BENAZEPRIL HCL) 1po once daily  #90 x 3   Entered and Authorized by:   Corwin Levins MD   Signed by:   Corwin Levins MD on 01/24/2009   Method used:   Electronically to        [facility] (retail)       [address]       [place], [place]  [address]       Ph: [phone]       Fax: [phone]   RxID:   [id number]  Flu Vaccine Consent Questions     Do you have a history of severe allergic reactions to this vaccine? no    Any prior history of allergic reactions to egg and/or gelatin? no  Do you have a sensitivity to the preservative Thimersol? no    Do you have a past history of Guillan-Barre Syndrome? no    Do you currently have an acute febrile illness? no    Have you ever had a severe reaction to latex? no    Vaccine information given and explained to patient? yes    Are you currently pregnant? no    Lot Number:[id number]   Exp Date:10/10/2009   Site Given  Left Deltoid [id number]    Immunizations Administered:  Tetanus Vaccine:    Vaccine Type: Td    Site: right deltoid    Mfr: Sanofi Pasteur    Dose: 0.5 ml    Route: IM    Given by: Margaret Pyle, CMA    Exp. Date: 07/25/2010    Lot #: [id number]

## 2010-05-15 NOTE — Letter (Signed)
Summary: Primary Care Appointment Letter  [facility]  [address] [place], [place] [address]   Phone: [phone]  Fax: [phone]    06/17/2009 MRN: [id number]  [facility] [address] [place], [place]  [address]  Dear Ms. Laban,   Your Primary Care Physician Corwin Levins MD has indicated that:    _______it is time to schedule an appointment.    _______you missed your appointment on______ and need to call and          reschedule.    _______you need to have lab work done.    _______you need to schedule an appointment discuss lab or test results.    _X_____you need to call to reschedule your appointment that is                       scheduled on 07/25/09 @ 8A w/Dr Jonny Ruiz.     Please call our office as soon as possible. Our phone number is [phone]. Please press option 1. Our office is open 8a-12noon and 1p-5p, Monday through Friday.     Thank you,    [facility] Scheduler

## 2010-05-15 NOTE — Miscellaneous (Signed)
Summary: BONE DENSITY  Clinical Lists Changes  Orders: Added new Test order of T-Lumbar Vertebral Assessment (77082) - Signed 

## 2010-05-15 NOTE — Assessment & Plan Note (Signed)
Summary: 6 mo rov /nws #     Vital Signs:  Patient profile:   77 year old female Height:      61 inches Weight:      242.25 pounds BMI:     45.94 O2 Sat:      95 % on Room air Temp:     98.9 degrees F oral Pulse rate:   70 / minute BP sitting:   164 / 100  (left arm) Cuff size:   large  Vitals Entered ByZella Ball Ewing (July 26, 2009 8:09 AM)  O2 Flow:  Room air  CC: 6 Mo  ROV/RE   CC:  6 Mo  ROV/RE.  History of Present Illness: overall doing well;  lost 8 lbs with better diet, trying to be more active;  Pt denies CP, sob, doe, wheezing, orthopnea, pnd, worsening LE edema, palps, dizziness or syncope  Pt denies new neuro symptoms such as headache, facial or extremity weakness  Pt denies polydipsia, polyuria, or low sugar symptoms such as shakiness improved with eating.  Overall good compliance with meds, trying to follow low chol, DM diet, wt stable, little excercise however   Has ongoing family social stressors but dealing with it ok, without increased depressive symptoms, suicidal ideation, or panic.   Note:  did not take BP med this AM as she thought :"fasting" meant no meds as well.  BP at home most often average about 135/72 per pt.  Had myalgias with the pravastatin so not taking very often, is willing to try the simvastatin again since it is  generic.   No new complaints o/w.  Still due for yearly mammogram.  Problems Prior to Update: 1)  Glucose Intolerance  (ICD-271.3) 2)  Menopausal Disorder  (ICD-627.9) 3)  Preventive Health Care  (ICD-V70.0) 4)  Preventive Health Care  (ICD-V70.0) 5)  Encephalocele  (ICD-742.0) 6)  Degenerative Joint Disease, Left Knee  (ICD-715.96) 7)  Hyperlipidemia  (ICD-272.4) 8)  Hypertension  (ICD-401.9) 9)  Gout  (ICD-274.9) 10)  Gerd  (ICD-530.81) 11)  Allergic Rhinitis  (ICD-477.9) 12)  Hypercholesterolemia  (ICD-272.0)  Medications Prior to Update: 1)  Ecotrin 325 Mg  Tbec (Aspirin) .Marland Kitchen.. 1 By Mouth Qd 2)  Benazepril Hcl 40 Mg Tabs  (Benazepril Hcl) .Marland Kitchen.. 1po Once Daily 3)  Carvedilol 25 Mg  Tabs (Carvedilol) .Marland Kitchen.. 1 By Mouth Two Times A Day 4)  Furosemide 40 Mg  Tabs (Furosemide) .Marland Kitchen.. 1 By Mouth Once Daily 5)  Pravachol 40 Mg  Tabs (Pravastatin Sodium) .... 2 By Mouth Once Daily 6)  Amlodipine Besylate 5 Mg Tabs (Amlodipine Besylate) .Marland Kitchen.. 1 By Mouth Once Daily  Current Medications (verified): 1)  Ecotrin 325 Mg  Tbec (Aspirin) .Marland Kitchen.. 1 By Mouth Qd 2)  Benazepril Hcl 40 Mg Tabs (Benazepril Hcl) .Marland Kitchen.. 1po Once Daily 3)  Carvedilol 25 Mg  Tabs (Carvedilol) .Marland Kitchen.. 1 By Mouth Two Times A Day 4)  Furosemide 40 Mg  Tabs (Furosemide) .Marland Kitchen.. 1 By Mouth Once Daily 5)  Simvastatin 40 Mg Tabs (Simvastatin) .Marland Kitchen.. 1 By Mouth Once Daily 6)  Amlodipine Besylate 5 Mg Tabs (Amlodipine Besylate) .Marland Kitchen.. 1 By Mouth Once Daily  Allergies (verified): 1)  ! Codeine 2)  ! Mevacor 3)  ! Pravachol  Past History:  Past Medical History: Last updated: 01/24/2009 Allergic rhinitis GERD Gout Hypertension Hyperlipidemia knee djd glucose intolerance  Past Surgical History: Last updated: 01/23/2007 s/p anterior encephalocele repair and craniotomy - 2003  Social History: Last updated: 01/25/2008 Never Smoked Alcohol  use-no Married 7 children work - Marketing executive stylist  Risk Factors: Smoking Status: never (01/23/2007)  Review of Systems       all otherwise negative per pt -    Physical Exam  General:  alert and overweight-appearing.   Head:  normocephalic and atraumatic.   Eyes:  vision grossly intact, pupils equal, and pupils round.   Ears:  R ear normal and L ear normal.   Nose:  no external deformity and no nasal discharge.   Mouth:  no gingival abnormalities and pharynx pink and moist.   Neck:  supple and no masses.   Lungs:  normal respiratory effort and normal breath sounds.   Heart:  normal rate and regular rhythm.   Extremities:  no edema, no erythema    Impression & Recommendations:  Problem # 1:   HYPERCHOLESTEROLEMIA (ICD-272.0)  Her updated medication list for this problem includes:    Simvastatin 40 Mg Tabs (Simvastatin) .Marland Kitchen... 1 by mouth once daily ok to try again the simvastatin 40 mg - to call if has myalgias, Pt to continue diet efforts, ; to check labs next visit  - goal LDL less than 70   Labs Reviewed: SGOT: 21 (01/24/2009)   SGPT: 19 (01/24/2009)   HDL:45.80 (01/24/2009), 47.0 (01/25/2008)  LDL:DEL (01/25/2008), DEL (01/21/2007)  Chol:260 (01/24/2009), 249 (01/25/2008)  Trig:148.0 (01/24/2009), 147 (01/25/2008)  Problem # 2:  HYPERTENSION (ICD-401.9)  Her updated medication list for this problem includes:    Benazepril Hcl 40 Mg Tabs (Benazepril hcl) .Marland Kitchen... 1po once daily    Carvedilol 25 Mg Tabs (Carvedilol) .Marland Kitchen... 1 by mouth two times a day    Furosemide 40 Mg Tabs (Furosemide) .Marland Kitchen... 1 by mouth once daily    Amlodipine Besylate 5 Mg Tabs (Amlodipine besylate) .Marland Kitchen... 1 by mouth once daily elev today due to no taking meds - informed pt Ok to take meds even though fasting;  Continue all previous medications as before this visit,  to cont check BP at home and [facility], f/u next visit   BP today: 164/100 Prior BP: 201/102 (01/24/2009)  Labs Reviewed: K+: 4.3 (01/24/2009) Creat: : 1.0 (01/24/2009)   Chol: 260 (01/24/2009)   HDL: 45.80 (01/24/2009)   LDL: DEL (01/25/2008)   TG: 148.0 (01/24/2009)  Problem # 3:  GLUCOSE INTOLERANCE (ICD-271.3) mild, asympt  - most recetn labs reviewed, ok to follow  Complete Medication List: 1)  Ecotrin 325 Mg Tbec (Aspirin) .Marland Kitchen.. 1 by mouth qd 2)  Benazepril Hcl 40 Mg Tabs (Benazepril hcl) .Marland Kitchen.. 1po once daily 3)  Carvedilol 25 Mg Tabs (Carvedilol) .Marland Kitchen.. 1 by mouth two times a day 4)  Furosemide 40 Mg Tabs (Furosemide) .Marland Kitchen.. 1 by mouth once daily 5)  Simvastatin 40 Mg Tabs (Simvastatin) .Marland Kitchen.. 1 by mouth once daily 6)  Amlodipine Besylate 5 Mg Tabs (Amlodipine besylate) .Marland Kitchen.. 1 by mouth once daily  Patient Instructions: 1)  please call for your  yearly mammogram  2)  stop the pravachol 3)  starr the simvastatin 40 mg per day for cholesterol 4)  Continue all previous medications as before this visit  5)  Please keep up the good work with losing wt! 6)  Please schedule a follow-up appointment in 6 months with CPX labs and: 7)  HbgA1C prior to visit, ICD-9: 790.2 Prescriptions: SIMVASTATIN 40 MG TABS (SIMVASTATIN) 1 by mouth once daily  #90 x 3   Entered and Authorized by:   Corwin Levins MD   Signed by:   Corwin Levins  MD on 07/26/2009   Method used:   Electronically to        [facility]       [address]       [place], [place]  [address]       Ph: [phone]       Fax: [phone]   RxID:   [id number]

## 2010-05-15 NOTE — Assessment & Plan Note (Signed)
Summary: 6 MO ROV /NWS  #   Vital Signs:  Patient profile:   77 year old female Height:      62 inches Weight:      251.25 pounds BMI:     46.12 O2 Sat:      98 % on Room air Temp:     99.2 degrees F oral Pulse rate:   73 / minute BP sitting:   160 / 90  (left arm) Cuff size:   large  Vitals Entered By: Zella Ball Ewing CMA (AAMA) (January 28, 2010 8:15 AM)  O2 Flow:  Room air CC: 6 month ROV/Re   CC:  6 month ROV/Re.  History of Present Illness: here for wellneess;  overall doing well;  tolerating the simvastatin no significant myalgias;  Pt denies CP, worsening sob, doe, wheezing, orthopnea, pnd, worsening LE edema, palps, dizziness or syncope  Pt denies new neuro symptoms such as headache, facial or extremity weakness  Pt denies polydipsia, polyuria  Overall good compliance with meds, trying to follow low chol diet, wt stable, little excercise however.  Checks cbgs with husband on occasion  -  usualyly 100 in the am .  [facility] with cane today for staibility as she can get off balance somewhat with standing, but not to walking. No signficinat orthopedic pain except for chronic knee pains intermittent as before, stable.   BP at home usually 140 or less at home.  Unaware of any fever todyay - denies ST, cough, wheezing, dysuria, freq, urgency  Preventive Screening-Counseling & Management      Drug Use:  no.    Problems Prior to Update: 1)  Glucose Intolerance  (ICD-271.3) 2)  Menopausal Disorder  (ICD-627.9) 3)  Preventive Health Care  (ICD-V70.0) 4)  Preventive Health Care  (ICD-V70.0) 5)  Encephalocele  (ICD-742.0) 6)  Degenerative Joint Disease, Left Knee  (ICD-715.96) 7)  Hyperlipidemia  (ICD-272.4) 8)  Hypertension  (ICD-401.9) 9)  Gout  (ICD-274.9) 10)  Gerd  (ICD-530.81) 11)  Allergic Rhinitis  (ICD-477.9) 12)  Hypercholesterolemia  (ICD-272.0)  Medications Prior to Update: 1)  Ecotrin 325 Mg  Tbec (Aspirin) .Marland Kitchen.. 1 By Mouth Qd 2)  Benazepril Hcl 40 Mg Tabs (Benazepril  Hcl) .Marland Kitchen.. 1po Once Daily 3)  Carvedilol 25 Mg  Tabs (Carvedilol) .Marland Kitchen.. 1 By Mouth Two Times A Day 4)  Furosemide 40 Mg  Tabs (Furosemide) .Marland Kitchen.. 1 By Mouth Once Daily 5)  Simvastatin 40 Mg Tabs (Simvastatin) .Marland Kitchen.. 1 By Mouth Once Daily 6)  Amlodipine Besylate 5 Mg Tabs (Amlodipine Besylate) .Marland Kitchen.. 1 By Mouth Once Daily  Current Medications (verified): 1)  Ecotrin 325 Mg  Tbec (Aspirin) .Marland Kitchen.. 1 By Mouth Qd 2)  Benazepril Hcl 40 Mg Tabs (Benazepril Hcl) .Marland Kitchen.. 1po Once Daily 3)  Carvedilol 25 Mg  Tabs (Carvedilol) .Marland Kitchen.. 1 By Mouth Two Times A Day 4)  Furosemide 40 Mg  Tabs (Furosemide) .Marland Kitchen.. 1 By Mouth Once Daily 5)  Simvastatin 40 Mg Tabs (Simvastatin) .Marland Kitchen.. 1 By Mouth Once Daily 6)  Amlodipine Besylate 5 Mg Tabs (Amlodipine Besylate) .Marland Kitchen.. 1 By Mouth Once Daily  Allergies (verified): 1)  ! Codeine 2)  ! Mevacor 3)  ! 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 Family History Hypertension  Social History: Reviewed history from 01/25/2008 and no changes required. 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         all otherwise negative per pt -    Physical Exam  General:  alert and overweight-appearing.   Head:  normocephalic and atraumatic.   Eyes:  vision grossly intact, pupils equal, and pupils round.   Ears:  R ear normal and L ear normal.   Nose:  no external deformity and no nasal discharge.   Mouth:  no gingival abnormalities and pharynx pink and moist.   Neck:  supple and no masses.   Lungs:  normal respiratory effort and normal breath sounds.   Heart:  normal rate and regular rhythm.   Abdomen:  soft, non-tender, and normal bowel sounds.   Msk:  no joint tenderness and no joint swelling.   Extremities:  chronic trace edema bilat to mid calves/venous insufficiency Neurologic:  cranial nerves II-XII intact, strength normal in all extremities, and DTRs symmetrical and normal.     Impression & Recommendations:  Problem # 1:  Preventive Health Care (ICD-V70.0) Overall doing well, age appropriate education and counseling updated, referral for preventive services and immunizations addressed, dietary counseling and smoking status adressed , most recent labs reviewed with pt, ecg reviewed or declined per pt ; had flu shot today, needs to call for mammogram Orders: TLB-BMP (Basic Metabolic Panel-BMET) (80048-METABOL) TLB-CBC Platelet - w/Differential (85025-CBCD) TLB-Hepatic/Liver Function Pnl (80076-HEPATIC) TLB-Lipid Panel (80061-LIPID) TLB-TSH (Thyroid Stimulating Hormone) (84443-TSH)  Problem # 2:  GLUCOSE INTOLERANCE (ICD-271.3)  asympt - for a1c, cont diet as above, Pt to cont DM diet, increased excercise/walkiing, wt loss efforts; to check labs today   Orders: TLB-A1C / Hgb A1C (Glycohemoglobin) (83036-A1C)  Problem # 3:  FEVER UNSPECIFIED  (ICD-780.60)  Orders: TLB-Udip w/ Micro (81001-URINE) T-Culture, Urine ([id number])   exam benign  - to check urine tests given age and cop-morbidities  Problem # 4:  HYPERTENSION (ICD-401.9)  Her updated medication list for this problem includes:    Benazepril Hcl 40 Mg Tabs (Benazepril hcl) .Marland Kitchen... 1po once daily    Carvedilol 25 Mg Tabs (Carvedilol) .Marland Kitchen... 1 by mouth two times a day    Furosemide 40 Mg Tabs (Furosemide) .Marland Kitchen... 1 by mouth once daily    Amlodipine Besylate 5 Mg Tabs (Amlodipine besylate) .Marland Kitchen... 1 by mouth once daily  BP today: 160/90 Prior BP: 164/100 (07/26/2009)  Labs Reviewed: K+: 4.0 (07/26/2009) Creat: : 1.0 (07/26/2009)   Chol: 253 (07/26/2009)   HDL: 53.60 (07/26/2009)   LDL: DEL (01/25/2008)   TG: 153.0 (07/26/2009) stable overall by hx and exam, ok to continue meds/tx as is   Problem # 5:  HYPERLIPIDEMIA (ICD-272.4)  Her updated medication list for this problem includes:    Simvastatin 40 Mg Tabs (Simvastatin) .Marland Kitchen... 1 by mouth once daily  Labs Reviewed: SGOT: 21 (01/24/2009)   SGPT: 19 (01/24/2009)   HDL:53.60 (07/26/2009), 45.80 (01/24/2009)  LDL:DEL (01/25/2008), DEL (01/21/2007)  Chol:253 (07/26/2009), 260 (01/24/2009)  Trig:153.0 (07/26/2009), 148.0 (01/24/2009) on new statin, tolerating well - Pt to continue diet efforts, good med tolerance; to check labs - goal LDL less than 100  Complete Medication List: 1)  Ecotrin 325 Mg Tbec (Aspirin) .Marland Kitchen.. 1 by mouth qd 2)  Benazepril Hcl 40 Mg Tabs (Benazepril hcl) .Marland Kitchen.. 1po once daily 3)  Carvedilol 25 Mg Tabs (Carvedilol) .Marland Kitchen.. 1 by mouth two times a day 4)  Furosemide 40 Mg Tabs (Furosemide) .Marland Kitchen.. 1 by mouth once daily 5)  Simvastatin 40 Mg Tabs (Simvastatin) .Marland Kitchen.. 1 by mouth once daily 6)  Amlodipine Besylate 5 Mg Tabs (Amlodipine besylate) .Marland Kitchen.. 1 by mouth once daily  Other Orders: Flu Vaccine 81yrs + MEDICARE PATIENTS (E4540) Administration Flu vaccine - MCR (J8119)  Patient Instructions: 1)  you had  your flu shot today 2)  Please go to the Lab in the basement for your blood and/or urine tests today 3)  Please call the number on the [facility] Card for results of your testing 4)  please call for your yearly mammogram 5)  Please schedule a follow-up appointment in 6 months with: 6)  BMP prior to visit, ICD-9: 790.2 7)  Lipid Panel prior to visit, ICD-9: 8)  HbgA1C prior to visit, ICD-9: Prescriptions: AMLODIPINE BESYLATE 5 MG TABS (AMLODIPINE BESYLATE) 1 by mouth once daily  #90 x 3   Entered and Authorized by:   Corwin Levins MD   Signed by:   Corwin Levins MD on 01/28/2010   Method used:   Print then Give to Patient   RxID:   [id number] SIMVASTATIN 40 MG TABS (SIMVASTATIN) 1 by mouth once daily  #90 x 3   Entered and Authorized by:   Corwin Levins MD   Signed by:   Corwin Levins MD on 01/28/2010   Method used:   Print then Give to Patient   RxID:   [id number] FUROSEMIDE 40 MG  TABS (FUROSEMIDE) 1 by mouth once daily  #90 x 3   Entered and Authorized by:   Corwin Levins MD   Signed by:   Corwin Levins MD on 01/28/2010   Method used:   Print then Give to Patient   RxID:   [id number] CARVEDILOL 25 MG  TABS (CARVEDILOL) 1 by mouth two times a day  #180 x 3   Entered and Authorized by:   Corwin Levins MD   Signed by:   Corwin Levins MD on 01/28/2010   Method used:   Print then Give to Patient   RxID:   [id number] BENAZEPRIL HCL 40 MG TABS (BENAZEPRIL HCL) 1po once daily  #90 x 3   Entered and Authorized by:   Corwin Levins MD   Signed by:   Corwin Levins MD on 01/28/2010   Method used:   Print then Give to Patient   RxID:   [id number]    Orders Added: 1)  Flu Vaccine 68yrs + MEDICARE PATIENTS [Q2039] 2)  Administration Flu vaccine - MCR [G0008] 3)  TLB-BMP (Basic Metabolic Panel-BMET) [80048-METABOL] 4)  TLB-CBC Platelet - w/Differential [85025-CBCD] 5)  TLB-Hepatic/Liver Function Pnl [80076-HEPATIC] 6)  TLB-Lipid Panel [80061-LIPID] 7)  TLB-TSH (Thyroid  Stimulating Hormone) [84443-TSH] 8)  TLB-Udip w/ Micro [81001-URINE] 9)  T-Culture, Urine [[id number]] 10)  TLB-A1C / Hgb A1C (Glycohemoglobin) [83036-A1C] 11)  Est. Patient 65& > [20254]  Flu Vaccine Consent Questions     Do you have a  history of severe allergic reactions to this vaccine? no    Any prior history of allergic reactions to egg and/or gelatin? no    Do you have a sensitivity to the preservative Thimersol? no    Do you have a past history of Guillan-Barre Syndrome? no    Do you currently have an acute febrile illness? no    Have you ever had a severe reaction to latex? no    Vaccine information given and explained to patient? yes    Are you currently pregnant? no    Lot Number:[id number]   Exp Date:10/11/2010   Site Given  Left Deltoid IM       [email or web address]   Preventive Care Screening     still declines colonoscopy

## 2010-07-23 ENCOUNTER — Other Ambulatory Visit: Payer: Self-pay

## 2010-07-23 ENCOUNTER — Other Ambulatory Visit: Payer: Self-pay | Admitting: Internal Medicine

## 2010-07-24 DIAGNOSIS — Q019 Encephalocele, unspecified: Secondary | ICD-10-CM

## 2010-07-26 ENCOUNTER — Encounter: Payer: Self-pay | Admitting: Internal Medicine

## 2010-07-26 DIAGNOSIS — R7302 Impaired glucose tolerance (oral): Secondary | ICD-10-CM

## 2010-07-26 DIAGNOSIS — Z0000 Encounter for general adult medical examination without abnormal findings: Secondary | ICD-10-CM | POA: Insufficient documentation

## 2010-07-26 HISTORY — DX: Impaired glucose tolerance (oral): R73.02

## 2010-07-30 ENCOUNTER — Other Ambulatory Visit (INDEPENDENT_AMBULATORY_CARE_PROVIDER_SITE_OTHER): Payer: Medicare Other | Admitting: Internal Medicine

## 2010-07-30 ENCOUNTER — Other Ambulatory Visit (INDEPENDENT_AMBULATORY_CARE_PROVIDER_SITE_OTHER): Payer: Medicare Other

## 2010-07-30 ENCOUNTER — Ambulatory Visit (INDEPENDENT_AMBULATORY_CARE_PROVIDER_SITE_OTHER): Payer: Medicare Other | Admitting: Internal Medicine

## 2010-07-30 ENCOUNTER — Encounter: Payer: Self-pay | Admitting: Internal Medicine

## 2010-07-30 VITALS — BP 166/110 | HR 83 | Temp 99.0°F | Ht 62.0 in | Wt 255.1 lb

## 2010-07-30 DIAGNOSIS — E785 Hyperlipidemia, unspecified: Secondary | ICD-10-CM

## 2010-07-30 DIAGNOSIS — R7302 Impaired glucose tolerance (oral): Secondary | ICD-10-CM

## 2010-07-30 DIAGNOSIS — I10 Essential (primary) hypertension: Secondary | ICD-10-CM

## 2010-07-30 DIAGNOSIS — M1710 Unilateral primary osteoarthritis, unspecified knee: Secondary | ICD-10-CM

## 2010-07-30 DIAGNOSIS — Z0000 Encounter for general adult medical examination without abnormal findings: Secondary | ICD-10-CM

## 2010-07-30 DIAGNOSIS — R7309 Other abnormal glucose: Secondary | ICD-10-CM

## 2010-07-30 DIAGNOSIS — IMO0002 Reserved for concepts with insufficient information to code with codable children: Secondary | ICD-10-CM

## 2010-07-30 LAB — BASIC METABOLIC PANEL
BUN: 16 mg/dL (ref 6–23)
CO2: 29 mEq/L (ref 19–32)
Calcium: 9 mg/dL (ref 8.4–10.5)
Chloride: 107 mEq/L (ref 96–112)
Creatinine, Ser: 0.8 mg/dL (ref 0.4–1.2)
GFR: 86.28 mL/min (ref >60.00)
Glucose, Bld: 102 mg/dL — ABNORMAL HIGH (ref 70–99)
Potassium: 4 mEq/L (ref 3.5–5.1)
Sodium: 143 mEq/L (ref 135–145)

## 2010-07-30 LAB — LIPID PANEL
Cholesterol: 219 mg/dL — ABNORMAL HIGH (ref 0–200)
HDL: 58 mg/dL (ref >39.00)
Total CHOL/HDL Ratio: 4
Triglycerides: 104 mg/dL (ref 0.0–149.0)
VLDL: 20.8 mg/dL (ref 0.0–40.0)

## 2010-07-30 LAB — HEMOGLOBIN A1C: Hgb A1c MFr Bld: 6.5 % (ref 4.6–6.5)

## 2010-07-30 LAB — LDL CHOLESTEROL, DIRECT: Direct LDL: 137.9 mg/dL

## 2010-07-30 MED ORDER — AMLODIPINE BESYLATE 5 MG PO TABS: 5.0000 mg | ORAL_TABLET | Freq: Every day | ORAL | Status: DC

## 2010-07-30 MED ORDER — FUROSEMIDE 40 MG PO TABS: 40.0000 mg | ORAL_TABLET | Freq: Every day | ORAL | Status: DC

## 2010-07-30 MED ORDER — BENAZEPRIL HCL 40 MG PO TABS: 40.0000 mg | ORAL_TABLET | Freq: Every day | ORAL | Status: DC

## 2010-07-30 MED ORDER — SIMVASTATIN 40 MG PO TABS: 40.0000 mg | ORAL_TABLET | Freq: Every day | ORAL | Status: DC

## 2010-07-30 MED ORDER — CARVEDILOL 25 MG PO TABS: 25.0000 mg | ORAL_TABLET | Freq: Two times a day (BID) | ORAL | Status: DC

## 2010-07-30 NOTE — Assessment & Plan Note (Signed)
stable overall by hx and exam, most recent lab reviewed with pt, and pt to continue medical treatment as before  Lab Results  Component Value Date   HGBA1C 6.5 07/30/2010

## 2010-07-30 NOTE — Assessment & Plan Note (Signed)
stable overall by hx and exam, most recent lab reviewed with pt, and pt to continue medical treatment as before, again I think she would benefit from change to lipitor but she has been intol of 2 statins so far, and wants to cont the simvastatin as seems to tolerate   Lab Results  Component Value Date   LDLCALC 128* 04/14/2006

## 2010-07-30 NOTE — Patient Instructions (Signed)
Continue all other medications as before Please continue to monitor your Blood Pressure as you do Please go to LAB in the Basement for the blood and/or urine tests to be done today Please call the number on the [facility] Card (the [employer] System) for results of testing in 2-3 days Please return in 6 mo with Lab testing done 3-5 days before

## 2010-07-30 NOTE — Assessment & Plan Note (Signed)
stable overall by hx and exam, most recent lab reviewed with pt, and pt to continue medical treatment as before, I suspect she would benefit from incrased med regimen but she demurs at this time  BP Readings from Last 3 Encounters:  07/30/10 166/110  01/28/10 160/90  07/26/09 164/100

## 2010-07-30 NOTE — Progress Notes (Signed)
Subjective:    Patient ID: Heather [name], female    DOB: 1928/07/02, 77 y.o.   MRN: [id number]  HPI  Here to f/u;  Overall doing ok;  Pt denies chest pain, increased sob or doe, wheezing, orthopnea, PND, increased LE swelling, palpitations, dizziness or syncope. Pt denies new neurological symptoms such as new headache, or facial or extremity weakness or numbness.   Pt denies polydipsia, polyuria, or low sugar symptoms such as weakness or confusion improved with po intake.  Pt states overall good compliance with meds, trying to follow lower cholesterol, diabetic diet, wt overall stable but little exercise however.  Overall good compliance with treatment, and good medicine tolerability. Denies worsening depressive symptoms, suicidal ideation, or panic, though has ongoing anxiety, not increased recently, though has had ongoing left knee DJD pain, as well as mult family stressors recently.  SBP at home < 140 regularly, did not take med yet today. Needs refills meds today.   Past Medical History  Diagnosis Date  . GLUCOSE INTOLERANCE 01/24/2009  . HYPERCHOLESTEROLEMIA 12/09/2006  . HYPERLIPIDEMIA 12/18/2006  . GOUT 12/09/2006  . HYPERTENSION 12/09/2006  . ALLERGIC RHINITIS 12/09/2006  . GERD 12/09/2006  . MENOPAUSAL DISORDER 01/25/2008  . DEGENERATIVE JOINT DISEASE, LEFT KNEE 01/23/2007  . Encephalocele 01/23/2007  . FEVER UNSPECIFIED 01/28/2010  . Impaired glucose tolerance 07/26/2010   Past Surgical History  Procedure Date  . S/p anterior encephalocele repair and craniotomy 2003    reports that she has never smoked. She does not have any smokeless tobacco history on file. She reports that she does not drink alcohol or use illicit drugs. family history includes Hypertension in her other. Allergies  Allergen Reactions  . Codeine   . Lovastatin   . Pravastatin Sodium    Current Outpatient Prescriptions on File Prior to Visit  Medication Sig Dispense Refill  . aspirin 325 MG EC tablet Take 325  mg by mouth daily.        [facility] DISCONTD: amLODipine (NORVASC) 5 MG tablet Take 5 mg by mouth daily.        Marland Kitchen DISCONTD: benazepril (LOTENSIN) 40 MG tablet Take 40 mg by mouth daily.        [facility] DISCONTD: carvedilol (COREG) 25 MG tablet Take 25 mg by mouth 2 (two) times daily.        [facility] DISCONTD: furosemide (LASIX) 40 MG tablet Take 40 mg by mouth daily.        Marland Kitchen DISCONTD: simvastatin (ZOCOR) 40 MG tablet Take 40 mg by mouth at bedtime.         Review of Systems All otherwise neg per pt     Objective:   Physical Exam BP 166/110  Pulse 83  Temp(Src) 99 F (37.2 C) (Oral)  Ht 5\' 2"  (1.575 m)  Wt 255 lb 2 oz (115.724 kg)  BMI 46.66 kg/m2  SpO2 97% Physical Exam  VS noted Constitutional: Pt appears well-developed and well-nourished.  HENT: Head: Normocephalic.  Right Ear: External ear normal.  Left Ear: External ear normal.  Eyes: Conjunctivae and EOM are normal. Pupils are equal, round, and reactive to light.  Neck: Normal range of motion. Neck supple.  Cardiovascular: Normal rate and regular rhythm.   Pulmonary/Chest: Effort normal and breath sounds normal.  Abd:  Soft, NT, non-distended, + BS Neurological: Pt is alert. No cranial nerve deficit.  Skin: Skin is warm. No erythema.  Psychiatric: Pt behavior is normal. Thought content normal. 1+ nervous Left knee without effusion but marked bony  change and crepitus, mild decreased ROM, walks with cane       Assessment & Plan:

## 2010-07-30 NOTE — Assessment & Plan Note (Signed)
stable overall by hx and exam,  and pt to continue medical treatment as before, tylenol prn

## 2010-07-31 NOTE — Progress Notes (Signed)
Quick Note:  Voice message left on [employer] system - lab is negative, normal or otherwise stable, pt to continue same tx ______ 

## 2010-08-29 NOTE — Discharge Summary (Signed)
[place]  Patient:    Heather [name], Heather [name] Visit Number: [id number] MRN: [id number]          Service Type: MED Location: 5000 [id number] Attending Physician:  Fenton Malling Dictated by:   Kelli Hope, M.D. Admit Date:  01/01/2001 Disc. Date: 01/19/01   CC:         Corwin Levins, M.D. [facility]  Danice Goltz, M.D.  Charlcie Cradle Delford Field, M.D. [facility]  Tinnie Gens C. Ninetta Lights, M.D.  Lyndal Pulley Aquilla Hacker., M.D.   Discharge Summary  DATE OF BIRTH:  1928-06-22  ADMISSION DIAGNOSES: 1. Presumed bacterial meningitis. 2. Hypertension.  DISCHARGE DIAGNOSES: 1. Bacterial meningitis, etiologic agent unclear, presumed Pneumococcus. 2. Pneumococcal bacteremia with sepsis. 3. Adult respiratory distress syndrome. 4. Ventilator dependent respiratory failure. 5. Ethmoid and sphenoid sinusitis. 6. Poor dentition with multiple periodontal abscesses. 7. Hypertension. 8. Deconditioning secondary to prolonged hospitalization.  CONDITION AT DISCHARGE:  Greatly improved.  DIET AT DISCHARGE:  Soft, low fat, low salt, and low cholesterol.  ACTIVITY:  Ad lib per home health.  MEDICATIONS AT DISCHARGE: 1. Augmentin 875 mg one p.o. b.i.d. 2. Saline nasal spray to each nostril t.i.d. 3. Nasonex two sprays q.nostril once a day. 4. Peridex oral rinse swish and spit twice a day. 5. Oral saline rinse swish and spit every two hours while awake. 6. Resume Lasix and Toprol at previous doses. 7. Imodium over-the-counter as needed for diarrhea.  SURGERIES:  Full mouth extraction, four quadrant alveoloplasty, and bilateral mandibular tori reduction by Dr. Chales Salmon and Dr. Kristin Bruins on January 14, 2001, no complications.  Panorex on January 13, 2001, revealing periapical lutency about the inferior teeth, approximately #20 and #27, likely abscesses.  PROCEDURES: 1. Fluoroscopic guided lumbar puncture, January 01, 2001, revealing white    cells of 11,750 with 94% ______, 3%  monocytes, 3% lymphs, 254 red blood    cells, protein 648, glucose less than 5.  Gram stain revealing no organism    and culture negative for growth. 2. PICC line placement, January 02, 2001. 3. Intubation, January 03, 2001, with mechanical ventilation through January 11, 2001. 4. EEG, January 03, 2001, revealing low amplitude and excessive beta    activity, likely related to sedative use. 5. Transthoracic echocardiogram performed January 03, 2001, essentially    normal. 6. Multiple CTs of the head revealing minimal atrophy and opacified ethmoid    and sphenoid sinuses. 7. Multiple chest x-rays revealed cardiomegaly with no active disease at    baseline.  Interval development of right upper lobe pneumonia with    subsequent resolution.  There was also interval development of left lower    lobe and eventually bibasilar atelectasis. 8. MRI of the brain, January 04, 2001, revealing high signal CHF, consistent    with meningitis and no focal abnormality. 9. Fluoroscopic-guided PANDA placement, January 03, 2001.  HOSPITAL COURSE:  Please see admission H&P for full details.  Briefly, this is a 77 year old woman who presented to the emergency room with several day history of fever and approximately four hour history of alteration in mental status.  She was noted to have a fever of 104 on admission.  CT of the head was negative and lumbar puncture performed in the ER was strongly suggestive of bacterial meningitis.  She was given Rocephin in the emergency room and admitted to the ICU.  Her initial prognosis is felt to be quite grave, and this was discussed extensively with the patients family.  A critical care consult was obtained by Dr. Danice Goltz who evaluated the patient and assisted with issues.  She remained afebrile and altered over the next two days.  While in radiology, replacement of a PANDA tube, she suffered increased respiratory distress necessitating  intubation.  An infectious disease consult was obtained and on their recommendation, the ceftriaxone, ampicillin, and acyclovir that she had been receiving was discontinued and she was placed on ________ and vancomycin.  Because of her ventilator dependent respiratory failure and sepsis picture, Xigris therapy was initiated in the medical ICU.  MRI was obtained to further rule out acute stroke and was negative.  She did not have any bleeding complications on the Xigris.  She remained intermittently febrile over the next several days.  However, her overall medical and neurologic status remained adequate.  By January 09, 2001 and January 10, 2001, the patient was felt to be showing considerable signs of neurologic improvement.  She was waking up a little bit and able to follow some commands.  Ventilator wean was initiated at that time.  She progressed well with this and was successfully extubated on January 11, 2001.  At that point, she was awake, alert, and able to answer some simple questions.  ENT and oral surgery evaluations were obtained at that time.  Dr. Arletha Grippe evaluated the patient and noted sinusitis, but recommended no surgical intervention at this time and ongoing medical therapy.  Dr. Chales Salmon and Dr. Kristin Bruins evaluated the patient and agreed that with her very poor dentition, she may be at risk for another episode, and with the patients consent, they proceeded with full extraction of all her teeth.  She did very well with this and only minimal pain following the procedure and continues to receive oral care.  She was deemed stable for transfer to the floor on January 13, 2001, although transfer was not accomplished until a couple of days later.   After her teeth were all successfully removed, she was brought to the floor where she did well.  Physical and occupational therapy were consulted and assisted in the patients rehabilitation.  She was fairly deconditioned,  but recovered quickly, and rehab consultants felt that she was too high level for inpatient rehab.  Therefore, arrangements were made for her to go home with home health assistance.  On advice of ID consultants, _______ was discontinued and she was placed on oral Augmentin.  She did have some diarrhea with this, but this was well-controlled with Imodium.  Arrangements were made for discharge on January 19, 2001, and she was successfully discharged to home.  FOLLOWUP:  She will follow up with her primary physician, Dr. Jonny Ruiz at some point next week as well as Dr. Arletha Grippe in ENT.  In approximately two or three weeks, she will need a follow up CT scan of her sinuses.  She will follow up with [facility] as needed. Dictated by:   Kelli Hope, M.D. Attending Physician:  Fenton Malling DD:  01/19/01 TD:  01/19/01 Job: [id number]

## 2010-08-29 NOTE — Consult Note (Signed)
[place]. [facility]  Patient:    Heather, [name] Visit Number: [id number] MRN: [id number]          Service Type: MED Location: MICU 2111 01 Attending Physician:  Fenton Malling Proc. Date: 01/12/01 Admit Date:  01/01/2001   CC:         Kelli Hope, M.D.  Corwin Levins, M.D. [facility]  Luisa Hart E. Delford Field, M.D. [facility]   Consultation Report  I was asked to see this 77 year old black female who was admitted on January 01, 2001, with bacterial meningitis with a history of altered mental status changes and significant headache and head pressure.  Lumbar puncture performed showed cloudy fluid that grew out Pneumococcal pneumoniae.  She has had multiple head CTs which have shown some sinusitis and some mucosal thickening in the mastoid air cells.  She does not give a history of a current sinus disease, double vision at this point, and actually she has recently been extubated and mental status is returning to normal.  She has had no high spiking fevers.  Her white count is actually decreased and she is doing well per family history, after this very appropriate medical therapy by the internal medicine critical care team and infectious disease team at [facility].  PHYSICAL EXAMINATION:  GENERAL:  Well-developed, well-nourished 77 year old white female on the 2100 intensive care unit in no acute distress.  HEENT:  Head is normocephalic and atraumatic.  Eyes are PERRL.  Extraocular muscles are intact.  Facial nerves intact bilaterally.  Ears, external canals appear normal.  TMs are intact without fluid or other abnormalities.  It appears that both middle ear spaces are well ventilated.  NECK:  Supple without mass, tenderness, or thyromegaly.  ORAL CAVITY:  Poor dentition, otherwise normal mucosa.  Gums without lesions.  Nasal examination does show an S-shaped septal deformity with some boggy congestion in the inferior and middle  turbinates and right nasal chamber. There is a Panda tube involving the left nasal chamber.  I did review extensively the latest head CT which is obtained on January 08, 2001, that does show some opacification of the right posterior ethmoid air cells in the right sphenoid sinus with a small air/fluid level involving the right maxillary sinus.  Minimal disease is noted in the right frontal sinus, but the left frontal, left ethmoid, and left maxillary and sphenoid sinus appears normal.  There is some minimal clouding of the mastoid air cells as noted on few cups of the head CT.  ASSESSMENT:  A 77 year old black female with history of bacterial meningitis. Improvement is noted on very aggressive medical therapy including IV antibiotics.  CT scan has revealed some right-sided ethmoid, maxillary, and sphenoid sinusitis.  Clinically it appears that she is doing much better and is improving well with appropriate medical therapy.  The question of mastoiditis on the head CT is not confirmed by my physical examination in that both middle ear spaces and TMs appear normal without signs of active fluid or infection.  RECOMMENDATIONS:  At this point it is very amazing how she has recovered with appropriate medical therapy.  There is no doubt at this point since she is doing well that surgical intervention would not be indicated.  However, if she should take a turn worse or start doing worse clinically, then an emergent endoscopic drainage of the sinusitis, especially on the right side would be appropriate.  However, since she is doing well on current medical therapy, I will hold  off at this time.  There is no need for ventilation tubes based through the tympanic membrane, because there is no sign of active infection at this time.  At this point, I would definitely continue with the antibiotics as you are currently doing per infectious disease recommendations.  I would hold off on any nasogastric  tube feedings or any type of nasogastric tube at all to promote drainage of the sinus cavities naturally.  I have also recommended some Afrin or papillonasal decongestant sprays for about 5 to 7 days and then stop.  She may also use some Flonase or Nasonex as a topical nasal steroid spray as directed for one month.  In addition to this, I would probably hold off on doing any nasal cannula oxygen, because that may dry out the nasal mucosa even further and that probably a face tent humidified oxygen would be a better option.  At this point, I will follow her from a distance, but if she starts having more difficulty or you are concerned with any further progression of her symptoms or further change in altered mental status, I will be available to help anyway that I can, but I would hold off on any surgical intervention at this time.  I can follow up with her on an outpatient basis. We will probably need to get a posttreatment CT scan of the sinuses at some point in the future once she has completed her medical therapy.  Thank you for allowing me to take part in the care of Heather [name].  If you have any further questions or concerns, please feel free to contact me at any time. Attending Physician:  Fenton Malling DD:  01/12/01 TD:  01/12/01 Job: [id number]

## 2010-08-29 NOTE — Discharge Summary (Signed)
[place]. [facility]  Patient:    Heather [name], Heather [name] Visit Number: [id number] MRN: [id number]          Service Type: MED Location: 5000 5014 [id number] Attending Physician:  Fenton Malling Dictated by:   Kelli Hope, M.D. Admit Date:  01/01/2001                             Discharge Summary  There was no dictation on this report. Dictated by:   Kelli Hope, M.D. Attending Physician:  Fenton Malling DD:  01/19/01 TD:  01/19/01 Job: [id number]

## 2010-08-29 NOTE — H&P (Signed)
[place]. [facility]  Patient:    Heather [name], Heather [name] Visit Number: [id number] MRN: [id number]          Service Type: MED Location: MICU 2111 01 Attending Physician:  Fenton Malling Dictated by:   Kelli Hope, M.D. Admit Date:  01/01/2001   CC:         Corwin Levins, M.D. [facility]   History and Physical  DATE OF BIRTH:  May 29, 2028  CHIEF COMPLAINT:  Altered mental status.  PRIMARY CARE PHYSICIAN:  Corwin Levins, M.D.  HISTORY OF PRESENT ILLNESS:  This is the first [place]. [facility] inpatient neurology service admission for this 77 year old woman with a past medical history including hypertension and coronary artery disease, who was brought to the emergency room with the above complaint.  Her family reports that she was able to drive to church this morning, but about 12:30 p.m. seemed to become more lethargic and confused.  She had trouble using the remote control and figuring out how to work things with her hands.  She gradually stopped talking and became less responsive.  She was brought to the emergency room at about 1600 hours.  Per family report, she had complained of a headache about three days prior.  There was no clear history of fever.  They also report that she had an abscessed tooth two or three weeks ago which "burst" and she did not seek attention for this and was not placed on antibiotics at any point.  PAST MEDICAL HISTORY:  Remarkable for hypertension, which is reportedly fairly poorly controlled.  The family reports that her diastolic blood pressures usually greater than 100.  She has coronary artery disease with a history of a PTCA 10 years ago and has had no heart problems since.  The family denies any history of diabetes, CHF, or renal disease.  FAMILY HISTORY:  Unavailable.  SOCIAL HISTORY:  She lives with her husband and is normally independent in her activities of daily living.  She is active outside, including  working in her garden.  It is not clear if she has had any recent mosquito bites.  There is no history of tobacco, alcohol, or illicit drug use.  ALLERGIES:  No known drug allergies.  MEDICATIONS:  Lasix and Toprol.  REVIEW OF SYSTEMS:  Unavailable secondary to the patients mental status. There was some recent ______ per the family.  Otherwise per HPI and ER notes.  PHYSICAL EXAMINATION:  VITAL SIGNS:  Temperature 103.9 degrees rectally on arrival to the ER and down to 95.8 after Tylenol.  Blood pressure 150/75 on arrival and down to 103/73 at 1900 hours.  Heart rate 70s and respirations 20.  GENERAL APPEARANCE:  Mental Status:  She is stuporous.  She does arouse to voice and turn toward to the examiner, but does not speak apart from occasional grunting noises.  She is unable to follow commands.  HEENT:  Head:  The cranium is normocephalic and atraumatic.  The oropharynx is benign.  She has very poor dentition with actively carious and abscessed teeth.  SKIN:  No rash.  NECK:  Stiff.  There are no carotid bruits.  HEART:  Regular rate and rhythm without murmurs.  CHEST:  Clear to auscultation.  ABDOMEN:  Obese, soft.  Normoactive bowel sounds.  EXTREMITIES:  No edema.  Pulses 2+.  NEUROLOGIC:  Mental Status:  As above.  Cranial Nerves:  Pupils are 4 mm and briskly reactive to 3 mm.  Ocular, cephalic,  corneal, and gag reflexes are all present.  It is not clear whether she blinks to threat consistently. Sensory/Motor:  There is increased tone in the upper extremities.  She does withdrawal to pain and localized pain well in all extremities.  Toes are downgoing.  Reflexes are all decreased throughout.  LABORATORY REVIEW:  CBC:  White cells 18.6, hemoglobin 12.2, platelets 330,000.  The CMET is remarkable for a low potassium of 3.2 and an elevated glucose of 177.  The urinalysis is negative.  Coagulation times are normal.  The CT of the head is personally reviewed and  reveals no acute finding in the brain.  There is ethmoid and sphenoid sinusitis.  Lumbar puncture was attempted at the bedside in the ER unsuccessfully.  It was subsequently performed under fluoroscopy with return of thick cloudy fluid and increased pressure.  The final results of the fluid were pending at the time that the H&P was written, but a preliminary white blood cell count was 11,750.  IMPRESSION:  Bacterial meningitis probably due to venous hematology spread related to the tooth abscess or possibly direct spread from sinusitis.  PLAN: 1. Antibiotics to include ampicillin and Rocephin.  She received her first    dose of Rocephin in the ER after the unsuccessful attempt at bedside lumbar    puncture and prior to fluoroscopic-guided lumbar puncture. 2. Admit to ICU.  Will obtain critical evaluation tonight as she may well need    intubation for airway protection and ventilatory support. 3. Neurologic check q.1h. tonight. 4. The severity of the patients condition and the guarded prognosis were    discussed at length with the family. Dictated by:   Kelli Hope, M.D. Attending Physician:  Fenton Malling DD:  01/02/01 TD:  01/02/01 Job: [id number]

## 2010-08-29 NOTE — Consult Note (Signed)
[place]. [facility]  Patient:    Heather [name], Heather [name] Visit Number: [id number] MRN: [id number]          Service Type: MED Location: 5000 5014 [id number] Attending Physician:  Fenton Malling Dictated by:   Cindra Eves, D.D.S. Proc. Date: 01/12/01 Admit Date:  01/01/2001   CC:         Charlcie Cradle. Delford Field, M.D. [facility]  Lyndal Pulley. Aquilla Hacker., M.D.   Consultation Report  DATE OF BIRTH:  [birth year]  HISTORY OF PRESENT ILLNESS:  Heather [name] is a 77 year old African-American female referred by Dr. Shan Levans for dental consultation.  The patient was admitted with altered mental status on January 01, 2001.  The patient was subsequently diagnosed with bacterial meningitis and has been on antibiotic therapy since then.  Hospital course has been complicated by ventilator dependent respiratory failure on January 03, 2001 with an extubation finally on January 11, 2001.  The patient has improved rapidly since then, and a dental consultation was requested for evaluation of dentition to rule out dental infection.  PAST MEDICAL HISTORY: 1. Bacterial meningitis - Strep pneumoniae. 2. Sinusitis - mastoiditis. 3. History of right upper lobe pneumonia. 4. Coronary artery disease - status post PTCA remote, approximately 10 years    ago. 5. Obesity. 6. Hypertension. 7. Acute respiratory failure with ventilator dependent respiratory failure    from January 03, 2001, to January 11, 2001. 8. History of GERD.  ALLERGIES/ADVERSE DRUG REACTIONS:  None known.  MEDICATIONS: 1. Dilantin 100 mg every eight hours IV. 2. Ventolin 2.5 mg every six hours for inhalation therapy. 3. Chlorhexidine rinse 0.12% rinsing three times daily. 4. Normal saline nasal spray three times daily. 5. Beconase two puffs twice daily. 6. Prevacid 30 mg daily. 7. Meropenem 1 g IV every eight hours. 8. Jevity Plus. 9. Afrin nasal spray two squirts each nostril twice daily.  SOCIAL HISTORY:   The patient is married with seven grown children.  The patient denies ever having smoked.  The patient is a nondrinker.  FAMILY HISTORY:  Noncontributory.  FUNCTIONAL ASSESSMENT:  The patient was independent for ADLs prior to this admission.  DENTAL HISTORY:  CHIEF COMPLAINT:  Dental consultation was requested to rule out dental infection.  HISTORY OF PRESENT ILLNESS:  Patient with a history of "abscess" approximately 2-3 weeks ago from an upper left canine area (tooth #11).  This resolved spontaneously.  The patient was last seen by a dentist for a dental extraction.  The patient denies complications from that extraction.  The patient does not seek regular dental care.  The patient denies the presence of dentures at this time.  DENTAL EXAMINATION:  GENERAL:  The patient is a well-developed, obese, African-American female in no acute distress.  VITAL SIGNS:  Blood pressure is 140/70, pulse 85, respirations are 22, temperature is 99.2.  HEENT/NECK:  There is no palpable lymphadenopathy.  There are no acute TMJ symptoms.  INTRAORAL EXAMINATION:  The patient has normal saliva.  The patient has bilateral mandibular lingual tori.  The patient has an upper right buccal exostosis in area #1-2.  There is no evidence of soft tissue abscesses.  PERIODONTAL:  Patient with chronic periodontitis with plaque and calculus accumulations, generalized gingival recession, tooth mobility, and moderate to severe bone loss.  DENTITION:  The patient is missing multiple teeth.  The patient has multiple root segments.  DENTAL CARIES:  There are rampant dental caries as per dental charting form.  ENDODONTIC:  There are  no acute pulpitis symptoms.  There is a history of an abscess formation of the upper left area, #11.  This was approximately 2-3 weeks ago.  The patient has a periapical radiolucency noted at the apices of tooth #10, 11, 18, 19, 22, 26, and 31.  There are no crown or  bridge restorations seen.  PROSTHODONTIC:  There is no history of dentures.  The patient will nee preprosthetic surgery prior to the fabrication of an upper and lower complete denture in the future.  OCCLUSION:  The patient has a poor occlusive scheme secondary to multiple missing teeth, multiple root segments, multiple malpositioned teeth, and lack of replacement of the missing teeth with dental restorations.  RADIOGRAPHIC INTERPRETATION:  A suboptimal panoramic x-ray was taken on January 13, 2001, and supplemented with periapical x-rays in the hospital dental clinic on January 13, 2001.  There are multiple missing teeth, #1, 2, 3, 4, 5, 12, 13, 14, 15, 16, 17, 23, 24, 25, 27, 30, and 32.  There are multiple root segments, #6, 8, 11, 18, 26, and 31.  There is super eruption drifting of the adipose teeth into the edentulous areas.  There is periapical radiolucency noted at the apices of tooth #10, 11, 18, 19, 22, 26, and 31.  There are multiple dental caries noted.  There is super eruption drifting of the adipose teeth in the edentulous areas.  ASSESSMENT:  1. Plaque and calculus accumulations.  2. Gingival recession.  3. Tooth mobility.  4. Chronic periodontitis with bone loss.  5. Dental caries.  6. Chronic apical periodontitis.  7. History of abscess formation approximately 2-3 weeks ago from the area of     #10-11.  8. Multiple missing teeth.  9. Multiple root segments. 10. Super eruption and drifting of the adipose teeth in the edentulous     areas. 11. Lack of replacement of the missing teeth with dentures. 12. Poor occlusive scheme. 13. Need for preprosthetic surgery prior to future denture fabrication.  PLAN/RECOMMENDATIONS:  1. I discussed the risks, benefits, and complications of various treatment     options with the patient and the relationship to her medical and dental      conditions.  We discussed total and subtotal extractions with     alveoloplasty,  bilateral tori reductions, upper right exostosis reduction,     and the fabrication of complete dentures by the private dentist of her     choice after adequate healing.  The patient wishes to proceed with the     removal of all remaining teeth at this time with the alveoloplasty,     bilateral tori reduction, and upper right exostosis removal in the     operating room with monitored anesthesia care or general anesthesia as     indicated.  2. Will refer to patient to Dr. Chales Salmon (oral surgeon) for these procedures     due to the complexity of the case.  He has been contacted and will consult     shortly.  3. Discussion of items with Dr. Shan Levans to ensure the     ability/stability of the patient to proceed with dental procedures as     planned at this time.  We will discuss extensively the use of general     anesthesia if indicated at this time.  4. Follow up with the private dentist of her choice in 4-6 weeks after the     dental extractions for the fabrication of upper and lower complete  dentures as indicated. Dictated by:   Cindra Eves, D.D.S. Attending Physician:  Fenton Malling DD:  01/13/01 TD:  01/13/01 Job: [id number]

## 2010-08-29 NOTE — Op Note (Signed)
[place]. [facility]  Patient:    Heather [name], Heather [name] Visit Number: [id number] MRN: [id number]          Service Type: MED Location: 5000 [id number] Attending Physician:  Fenton Malling Dictated by:   Lyndal Pulley Aquilla Hacker., M.D. Admit Date:  01/01/2001                             Operative Report  PREOPERATIVE DIAGNOSES: 1. Nonrestorable remaining maxillary and mandibular teeth. 2. Numerous abscessed and severely periodontally involved teeth. 3. Bilateral mandibular tori. 4. Multiple maxillary and mandibular exostoses.  POSTOPERATIVE DIAGNOSES: 1. Nonrestorable remaining maxillary and mandibular teeth. 2. Numerous abscessed and severely periodontally involved teeth. 3. Bilateral mandibular tori. 4. Multiple maxillary and mandibular exostoses.  OPERATION PERFORMED: 1. Removal of all remaining maxillary and mandibular teeth.  This includes    #6, 7, 8, 9, 10, 11, 18, 19, 20, 21, 22, 28, 29, and 31. 2. Removal of bilateral mandibular tori. 3. A four-quadrant alveoloplasty.  SURGEON:  Lyndal Pulley. Aquilla Hacker., M.D.  ANESTHESIA:  General via nasal endotracheal tube.  INDICATIONS:  The patient is a 77 year old lady who was initially admitted to the hospital with strep meningitis.  It was discovered on her admission and through her hospital course that she had numerous abscessed teeth.  DESCRIPTION OF PROCEDURE:  The patient was identified in the holding area and taken to the operating room where she was placed supine on the operating room table.  Following intravenous induction of anesthesia, the patient was intubated through the left nares without difficulty.  The nasal endotracheal tube was secured.  Attention was directed intraorally where ultimately 24 cc of 2% lidocaine with 1:100,000 epinephrine was infiltrated along the entire maxillary vestibule, bilateral palatal blocks were given, bilateral inferior alveolar nerve blocks were given and  additional anesthesia injected along the mandibular vestibule areas.  Attention initially was directed to the maxillary ridge where a #15 scalpel was used to make a sulcular incision around all the remaining maxillary teeth.  A periosteal elevator was used to reflect a full-thickness mucoperiosteal flap exposing the maxillary alveolar ridge.  All remaining maxillary teeth were then removed using both an elevator and forceps delivery technique.  An alveoloplasty was completed both from the right and left maxillary areas, using a fine rasp to refine the alveoloplasty.  The operative site was irrigated with copious amounts sterile saline and closed with 3-0 gut suture in a running baseball fashion.  Attention was then directed to the mandibular area where again a #15 scalpel was used to make a sulcular incision around all of the remaining mandibular teeth.  The #9 periosteal elevator again was used to reflect the full-thickness mucoperiosteum from around the mandibular teeth and over the mandibular tori bilaterally.  All remaining mandibular teeth again were the removed surgically using elevator and forceps delivery technique.  An alveoloplasty again was completed in the mandibular left and right sides.  The mandibular tori was then identified and removed with an alveoloplasty bur on a Striker handpiece.  Again, the tori reduction was refined with a fine rasp. The entire operative site was again irrigated with copious amounts sterile saline and the wound closed with 3-0 gut suture in running baseball fashion. The throat pack was removed and bilateral gauze packs were placed.  The patient was then allowed to awaken from the anesthesia, extubated in the operating room and transferred to the post anesthesia care  unit in stable condition having tolerated the procedure well.  Estimated blood loss was less than was less than 50 cc. Dictated by:   Lyndal Pulley Aquilla Hacker., M.D. Attending Physician:   Fenton Malling DD:  01/14/01 TD:  01/15/01 Job: [id number]

## 2010-08-29 NOTE — Discharge Summary (Signed)
[place]. [facility]  Patient:    Heather [name], Heather [name] Visit Number: [id number] MRN: [id number]          Service Type: MED Location: 5000 5014 01 Attending Physician:  Fenton Malling Dictated by:   Kelli Hope, M.D. Admit Date:  01/01/2001                             Discharge Summary  There was no dictation on this report. Dictated by:   Kelli Hope, M.D. Attending Physician:  Fenton Malling DD:  01/19/01 TD:  01/19/01 Job: [id number]

## 2010-08-29 NOTE — Consult Note (Signed)
[place]. [facility]  Patient:    Heather [name], Heather [name] Visit Number: [id number] MRN: [id number]          Service Type: MED Location: MICU 2111 01 Attending Physician:  Fenton Malling Dictated by:   Veverly Fells. Arletha Grippe, M.D. Proc. Date: 01/12/01 Admit Date:  01/01/2001   CC:         Kelli Hope, M.D.  Corwin Levins, M.D. [facility]  Luisa Hart E. Delford Field, M.D. [facility]   Consultation Report  HISTORY OF PRESENT ILLNESS:  I was asked to see this 77 year old black female who was admitted on January 01, 2001, with bacterial meningitis with a history of altered mental status changes and significant headache and head pressure. Lumbar puncture performed which showed cloudy fluid that grew out pneumococcal pneumonia. She has had multiple head CTs which have showed some sinusitis and some mucosal thickening in the mastoid air cells. She does not give a history of her current sinus disease, double vision at this point, and actually she has recently been extubated, and her mental status is returning to normal. She has had no high-spiking fevers. Her white count is actually decreased, and she is doing well per family history, after this very appropriate medical therapy by the internal medicine critical care team and infectious disease team at [facility].  PHYSICAL EXAMINATION:  GENERAL:  Well-developed, well-nourished, 77 year old white female in the 2100 intensive care unit, in no acute distress.  HEENT:  Head is normocephalic, atraumatic. Eyes are PERRLA. Extraocular movements are intact. Facial nerves are tight bilaterally. Ears: External canals appear normal. TMs are intact without fluid or other abnormalities. It appears that both middle ear spaces are well ventilated. Neck is supple. No mass, tenderness, or thyromegaly.  Oral cavity: Poor dentition, otherwise normal mucosa and gums. No lesions. Nasal examination does show an S-shaped septal deformity with some  boggy congestion of the inferior middle turbinates in the right nasal chamber. There is a Panda tube involved in the left nasal chamber. I did review extensively the latest head CT which was obtained on, I believe, January 08, 2001. It does show a small opacification of the right posterior ethmoid air cells in the right sphenoid sinus with a small air fluid level involving the right maxillary sinus. Minimal disease is noted in the right frontal sinus, but the left frontal, left ethmoid, and left maxillary and sphenoid sinus appear normal. There is some minimal clouding of the mastoid air cells as noted on few cuts of the head CT.  ASSESSMENT:  A 77 year old black female with a history of bacterial meningitis. Improvement is noted on very aggressive medical therapy, including intravenous antibiotics. CT scan of the head has revealed some right-sided ethmoid maxillary and sphenoid sinusitis. Clinically appears that she is doing much better and is improving well with appropriate medical therapy. The question of mastoiditis on the head CT is not confirmed by my physical examination and that both middle ear spaces and tympanic membranes appear normal rule out signs of active fluid or infection.  RECOMMENDATIONS:  At this point, it is very amazing how she has recovered with appropriate medical therapy. There is no doubt at this point, since she is doing well that surgical intervention would not be indicated. However, if she should take a turn for the worse or start doing worse clinically, then an emergent endoscopic drainage of the sinusitis, especially on the right side, would be appropriate. However, since she is doing well on current medical therapy, I will  hold off at this time. There is no need for ventilation tube space through the tympanic membrane because there are no signs of active infection at this time. At this point, I would definitely continue with the antibiotics as you are  currently doing per infectious disease recommendations. I would hold off on any nasogastric tube feedings or any type of nasogastric tube at all to promote drainage of the sinus cavities naturally. I have also recommended some Afrin or topical nasal decongestant sprays for about 5-7 days and then stop. She may also use some Flonase or Nasonex as a topical nasal steroid spray as directed for 1 month. In addition to this, I would probably hold off on doing any nasal cannula oxygen because that may ____the nasal mucosa even further and that probably a face tent humidified oxygen would be a better option. At this point, I will follow from a distance, but if she starts having more difficulties or you are concerned with any further progression of her symptoms or further change in altered mental status, I would be available to help in any way that I can, but I would hold off on any surgical intervention at this time. I can follow up with her on an outpatient basis. We will probably need to get a post-treatment CT scan of the sinuses at some point in the future once she has completed her medical therapy. I thank you for allowing me to take part in the care of Ms. Levenhagen. If you have any further questions or concerns, please feel free to contact me. Dictated by:   Veverly Fells. Arletha Grippe, M.D. Attending Physician:  Fenton Malling DD:  01/12/01 TD:  01/12/01 Job: [id number]

## 2011-01-29 ENCOUNTER — Encounter: Payer: Self-pay | Admitting: Internal Medicine

## 2011-01-29 ENCOUNTER — Other Ambulatory Visit: Payer: Self-pay | Admitting: Internal Medicine

## 2011-01-29 ENCOUNTER — Ambulatory Visit (INDEPENDENT_AMBULATORY_CARE_PROVIDER_SITE_OTHER): Payer: Medicare Other | Admitting: Internal Medicine

## 2011-01-29 ENCOUNTER — Other Ambulatory Visit (INDEPENDENT_AMBULATORY_CARE_PROVIDER_SITE_OTHER): Payer: Medicare Other

## 2011-01-29 VITALS — BP 154/100 | HR 65 | Temp 98.8°F | Ht 62.0 in | Wt 253.6 lb

## 2011-01-29 DIAGNOSIS — M25562 Pain in left knee: Secondary | ICD-10-CM

## 2011-01-29 DIAGNOSIS — E785 Hyperlipidemia, unspecified: Secondary | ICD-10-CM

## 2011-01-29 DIAGNOSIS — M25569 Pain in unspecified knee: Secondary | ICD-10-CM

## 2011-01-29 DIAGNOSIS — R7302 Impaired glucose tolerance (oral): Secondary | ICD-10-CM

## 2011-01-29 DIAGNOSIS — Z79899 Other long term (current) drug therapy: Secondary | ICD-10-CM

## 2011-01-29 DIAGNOSIS — R7309 Other abnormal glucose: Secondary | ICD-10-CM

## 2011-01-29 DIAGNOSIS — Z0000 Encounter for general adult medical examination without abnormal findings: Secondary | ICD-10-CM

## 2011-01-29 DIAGNOSIS — I10 Essential (primary) hypertension: Secondary | ICD-10-CM

## 2011-01-29 DIAGNOSIS — Z23 Encounter for immunization: Secondary | ICD-10-CM

## 2011-01-29 LAB — URINALYSIS, ROUTINE W REFLEX MICROSCOPIC
Bilirubin Urine: NEGATIVE
Ketones, ur: NEGATIVE
Leukocytes, UA: NEGATIVE
Nitrite: NEGATIVE
Specific Gravity, Urine: 1.01 (ref 1.000–1.030)
Total Protein, Urine: NEGATIVE
Urine Glucose: NEGATIVE
Urobilinogen, UA: 0.2 (ref 0.0–1.0)
pH: 5.5 (ref 5.0–8.0)

## 2011-01-29 LAB — CBC WITH DIFFERENTIAL/PLATELET
Basophils Absolute: 0.1 10*3/uL (ref 0.0–0.1)
Basophils Relative: 1.3 % (ref 0.0–3.0)
Eosinophils Absolute: 0.3 10*3/uL (ref 0.0–0.7)
Eosinophils Relative: 4.5 % (ref 0.0–5.0)
HCT: 42.5 % (ref 36.0–46.0)
Hemoglobin: 13.7 g/dL (ref 12.0–15.0)
Lymphocytes Relative: 32.9 % (ref 12.0–46.0)
Lymphs Abs: 2.1 10*3/uL (ref 0.7–4.0)
MCHC: 32.3 g/dL (ref 30.0–36.0)
MCV: 88.6 fl (ref 78.0–100.0)
Monocytes Absolute: 0.8 10*3/uL (ref 0.1–1.0)
Monocytes Relative: 11.9 % (ref 3.0–12.0)
Neutro Abs: 3.2 10*3/uL (ref 1.4–7.7)
Neutrophils Relative %: 49.4 % (ref 43.0–77.0)
Platelets: 258 10*3/uL (ref 150.0–400.0)
RBC: 4.79 Mil/uL (ref 3.87–5.11)
RDW: 15.4 % — ABNORMAL HIGH (ref 11.5–14.6)
WBC: 6.4 10*3/uL (ref 4.5–10.5)

## 2011-01-29 LAB — LIPID PANEL
Cholesterol: 262 mg/dL — ABNORMAL HIGH (ref 0–200)
HDL: 50.5 mg/dL (ref >39.00)
Total CHOL/HDL Ratio: 5
Triglycerides: 185 mg/dL — ABNORMAL HIGH (ref 0.0–149.0)
VLDL: 37 mg/dL (ref 0.0–40.0)

## 2011-01-29 LAB — BASIC METABOLIC PANEL
BUN: 19 mg/dL (ref 6–23)
CO2: 30 mEq/L (ref 19–32)
Calcium: 9 mg/dL (ref 8.4–10.5)
Chloride: 104 mEq/L (ref 96–112)
Creatinine, Ser: 1 mg/dL (ref 0.4–1.2)
GFR: 66.99 mL/min (ref >60.00)
Glucose, Bld: 107 mg/dL — ABNORMAL HIGH (ref 70–99)
Potassium: 4.1 mEq/L (ref 3.5–5.1)
Sodium: 141 mEq/L (ref 135–145)

## 2011-01-29 LAB — HEPATIC FUNCTION PANEL
ALT: 15 U/L (ref 0–35)
AST: 19 U/L (ref 0–37)
Albumin: 3.6 g/dL (ref 3.5–5.2)
Alkaline Phosphatase: 60 U/L (ref 39–117)
Bilirubin, Direct: 0.1 mg/dL (ref 0.0–0.3)
Total Bilirubin: 1 mg/dL (ref 0.3–1.2)
Total Protein: 7.4 g/dL (ref 6.0–8.3)

## 2011-01-29 LAB — HEMOGLOBIN A1C: Hgb A1c MFr Bld: 6.5 % (ref 4.6–6.5)

## 2011-01-29 LAB — TSH: TSH: 2.78 u[IU]/mL (ref 0.35–5.50)

## 2011-01-29 LAB — LDL CHOLESTEROL, DIRECT: Direct LDL: 175.2 mg/dL

## 2011-01-29 MED ORDER — FUROSEMIDE 40 MG PO TABS: 40.0000 mg | ORAL_TABLET | Freq: Every day | ORAL | Status: DC

## 2011-01-29 MED ORDER — AMLODIPINE BESYLATE 5 MG PO TABS: 5.0000 mg | ORAL_TABLET | Freq: Every day | ORAL | Status: DC

## 2011-01-29 MED ORDER — CARVEDILOL 25 MG PO TABS: 25.0000 mg | ORAL_TABLET | Freq: Two times a day (BID) | ORAL | Status: DC

## 2011-01-29 MED ORDER — SIMVASTATIN 40 MG PO TABS: 40.0000 mg | ORAL_TABLET | Freq: Every day | ORAL | Status: DC

## 2011-01-29 MED ORDER — ATORVASTATIN CALCIUM 40 MG PO TABS: 40.0000 mg | ORAL_TABLET | Freq: Every day | ORAL | Status: DC

## 2011-01-29 MED ORDER — PNEUMOCOCCAL VAC POLYVALENT 25 MCG/0.5ML IJ INJ: 0.5000 mL | INJECTION | Freq: Once | INTRAMUSCULAR | Status: DC

## 2011-01-29 MED ORDER — BENAZEPRIL HCL 40 MG PO TABS: 40.0000 mg | ORAL_TABLET | Freq: Every day | ORAL | Status: DC

## 2011-01-29 NOTE — Assessment & Plan Note (Signed)
stable overall by hx and exam, most recent data reviewed with pt, and pt to continue medical treatment as before  Lab Results  Component Value Date   HGBA1C 6.5 07/30/2010

## 2011-01-29 NOTE — Progress Notes (Signed)
Subjective:    Patient ID: Heather [name], female    DOB: Jan 14, 1929, 77 y.o.   MRN: [id number]  HPI Here for wellness and f/u;  Overall doing ok;  Pt denies CP, worsening SOB, DOE, wheezing, orthopnea, PND, worsening LE edema, palpitations, dizziness or syncope.  Pt denies neurological change such as new Headache, facial or extremity weakness.  Pt denies polydipsia, polyuria, or low sugar symptoms. Pt states overall good compliance with treatment and medications, good tolerability, and trying to follow lower cholesterol diet.  Pt denies worsening depressive symptoms, suicidal ideation or panic. No fever, wt loss, night sweats, loss of appetite, or other constitutional symptoms.  Pt states good ability with ADL's, low fall risk, home safety reviewed and adequate, no significant changes in hearing or vision, and occasionally active with exercise.  Ran out of BP meds 6 days ago. Has not had mammogram in several yrs.  Due for shots today.  Also states 2 days ago twisted the left knee with some increased swelling and pain, walking with cane today, but mild, no giveaway or fall, and does not want tx today or referral Past Medical History  Diagnosis Date  . GLUCOSE INTOLERANCE 01/24/2009  . HYPERCHOLESTEROLEMIA 12/09/2006  . HYPERLIPIDEMIA 12/18/2006  . GOUT 12/09/2006  . HYPERTENSION 12/09/2006  . ALLERGIC RHINITIS 12/09/2006  . GERD 12/09/2006  . MENOPAUSAL DISORDER 01/25/2008  . DEGENERATIVE JOINT DISEASE, LEFT KNEE 01/23/2007  . Encephalocele 01/23/2007  . FEVER UNSPECIFIED 01/28/2010  . Impaired glucose tolerance 07/26/2010   Past Surgical History  Procedure Date  . S/p anterior encephalocele repair and craniotomy 2003    reports that she has never smoked. She does not have any smokeless tobacco history on file. She reports that she does not drink alcohol or use illicit drugs. family history includes Hypertension in her other. Allergies  Allergen Reactions  . Codeine   . Lovastatin   .  Pravastatin Sodium    Current Outpatient Prescriptions on File Prior to Visit  Medication Sig Dispense Refill  . aspirin 325 MG EC tablet Take 325 mg by mouth daily.         No current facility-administered medications on file prior to visit.   Review of Systems Review of Systems  Constitutional: Negative for diaphoresis, activity change, appetite change and unexpected weight change.  HENT: Negative for hearing loss, ear pain, facial swelling, mouth sores and neck stiffness.   Eyes: Negative for pain, redness and visual disturbance.  Respiratory: Negative for shortness of breath and wheezing.   Cardiovascular: Negative for chest pain and palpitations.  Gastrointestinal: Negative for diarrhea, blood in stool, abdominal distention and rectal pain.  Genitourinary: Negative for hematuria, flank pain and decreased urine volume.  Musculoskeletal: Negative for myalgias and joint swelling.  Skin: Negative for color change and wound.  Neurological: Negative for syncope and numbness.  Hematological: Negative for adenopathy.  Psychiatric/Behavioral: Negative for hallucinations, self-injury, decreased concentration and agitation.      Objective:   Physical Exam BP 154/100  Pulse 65  Temp(Src) 98.8 F (37.1 C) (Oral)  Ht 5\' 2"  (1.575 m)  Wt 253 lb 9.6 oz (115.032 kg)  BMI 46.38 kg/m2  SpO2 99% Physical Exam  VS noted Constitutional: Pt is oriented to person, place, and time. Appears well-developed and well-nourished.  HENT:  Head: Normocephalic and atraumatic.  Right Ear: External ear normal.  Left Ear: External ear normal.  Nose: Nose normal.  Mouth/Throat: Oropharynx is clear and moist.  Eyes: Conjunctivae and EOM are normal.  Pupils are equal, round, and reactive to light.  Neck: Normal range of motion. Neck supple. No JVD present. No tracheal deviation present.  Cardiovascular: Normal rate, regular rhythm, normal heart sounds and intact distal pulses.   Pulmonary/Chest: Effort  normal and breath sounds normal.  Abdominal: Soft. Bowel sounds are normal. There is no tenderness.  Musculoskeletal: Normal range of motion. Exhibits no edema.  Lymphadenopathy:  Has no cervical adenopathy.  Left Knee with mild medial warmth, 1+effusion, decreased mild ROM, NT  Neurological: Pt is alert and oriented to person, place, and time. Pt has normal reflexes. No cranial nerve deficit.  Skin: Skin is warm and dry. No rash noted.  Psychiatric:  Has  normal mood and affect. Behavior is normal.     Assessment & Plan:

## 2011-01-29 NOTE — Patient Instructions (Addendum)
You had the flu shot today, and the pneumonia shot Please remember to followup with your yearly mammogram Please go to LAB in the Basement for the blood and/or urine tests to be done today Please call the phone number [phone] (the [employer]Tree System) for results of testing in 2-3 days;  When calling, simply dial the number, and when prompted enter the MRN number above (the Medical Record Number) and the # key, then the message should start. Please call if your knee does not improve for referral to orthopedic All medications were refilled to the pharmacy today Please return in 6 mo with Lab testing done 3-5 days before

## 2011-01-29 NOTE — Assessment & Plan Note (Signed)
Ran out of BP med 6 days ago and was "waiting for today" for refills; encouraged pt to have pharmacy call if this occurs in the future  BP Readings from Last 3 Encounters:  01/29/11 154/100  07/30/10 166/110  01/28/10 160/90

## 2011-01-29 NOTE — Assessment & Plan Note (Signed)
As of last LDL 6 mo ago she has finally decided to do better with diet, has been taking the zocor all along;  Today declines change of zocor to lipitor for better control, to cont to work on better diet  Last LDL 6 mo ago- 137

## 2011-01-29 NOTE — Assessment & Plan Note (Signed)
With mild effusion - ? DJD flare vs cartilage, ok to follow for now, consider ortho referral, declines for now

## 2011-01-29 NOTE — Assessment & Plan Note (Signed)
Overall doing well, age appropriate education and counseling updated, referrals for preventative services and immunizations addressed, dietary and smoking counseling addressed, most recent labs and ECG reviewed.  I have personally reviewed and have noted: 1) the patient's medical and social history 2) The pt's use of alcohol, tobacco, and illicit drugs 3) The patient's current medications and supplements 4) Functional ability including ADL's, fall risk, home safety risk, hearing and visual impairment 5) Diet and physical activities 6) Evidence for depression or mood disorder 7) The patient's height, weight, and BMI have been recorded in the chart I have made referrals, and provided counseling and education based on review of the above For flu shot, and pneumovax.  Encouraged again to get mammogram, which she has deferred for several yrs

## 2011-07-27 ENCOUNTER — Other Ambulatory Visit (INDEPENDENT_AMBULATORY_CARE_PROVIDER_SITE_OTHER): Payer: Medicare Other

## 2011-07-27 DIAGNOSIS — E039 Hypothyroidism, unspecified: Secondary | ICD-10-CM

## 2011-07-27 DIAGNOSIS — R7302 Impaired glucose tolerance (oral): Secondary | ICD-10-CM

## 2011-07-27 DIAGNOSIS — R7309 Other abnormal glucose: Secondary | ICD-10-CM

## 2011-07-27 LAB — LIPID PANEL
Cholesterol: 143 mg/dL (ref 0–200)
HDL: 53.7 mg/dL (ref >39.00)
LDL Cholesterol: 69 mg/dL (ref 0–99)
Total CHOL/HDL Ratio: 3
Triglycerides: 102 mg/dL (ref 0.0–149.0)
VLDL: 20.4 mg/dL (ref 0.0–40.0)

## 2011-07-27 LAB — BASIC METABOLIC PANEL
BUN: 13 mg/dL (ref 6–23)
CO2: 28 mEq/L (ref 19–32)
Calcium: 9 mg/dL (ref 8.4–10.5)
Chloride: 103 mEq/L (ref 96–112)
Creatinine, Ser: 0.8 mg/dL (ref 0.4–1.2)
GFR: 87.3 mL/min (ref >60.00)
Glucose, Bld: 106 mg/dL — ABNORMAL HIGH (ref 70–99)
Potassium: 3.5 mEq/L (ref 3.5–5.1)
Sodium: 141 mEq/L (ref 135–145)

## 2011-07-27 LAB — HEMOGLOBIN A1C: Hgb A1c MFr Bld: 6.4 % (ref 4.6–6.5)

## 2011-07-30 ENCOUNTER — Encounter: Payer: Self-pay | Admitting: Internal Medicine

## 2011-07-30 ENCOUNTER — Ambulatory Visit (INDEPENDENT_AMBULATORY_CARE_PROVIDER_SITE_OTHER): Payer: Medicare Other | Admitting: Internal Medicine

## 2011-07-30 VITALS — BP 138/100 | HR 75 | Temp 98.1°F | Ht 62.0 in | Wt 259.4 lb

## 2011-07-30 DIAGNOSIS — R7302 Impaired glucose tolerance (oral): Secondary | ICD-10-CM

## 2011-07-30 DIAGNOSIS — I10 Essential (primary) hypertension: Secondary | ICD-10-CM

## 2011-07-30 DIAGNOSIS — R7309 Other abnormal glucose: Secondary | ICD-10-CM

## 2011-07-30 DIAGNOSIS — Z0000 Encounter for general adult medical examination without abnormal findings: Secondary | ICD-10-CM

## 2011-07-30 DIAGNOSIS — E785 Hyperlipidemia, unspecified: Secondary | ICD-10-CM

## 2011-07-30 MED ORDER — AMLODIPINE BESYLATE 10 MG PO TABS: 10.0000 mg | ORAL_TABLET | Freq: Every day | ORAL | Status: DC

## 2011-07-30 NOTE — Assessment & Plan Note (Addendum)
BP Readings from Last 3 Encounters:  07/30/11 138/100  01/29/11 154/100  07/30/10 166/110   Uncontrolled, to increase the amlod to 10 qd,  to f/u any worsening symptoms or concerns

## 2011-07-30 NOTE — Progress Notes (Signed)
Subjective:    Patient ID: Heather [name], female    DOB: 04/03/1929, 78 y.o.   MRN: [id number]  HPI  Here to f/u; overall doing ok,  Pt denies chest pain, increased sob or doe, wheezing, orthopnea, PND, increased LE swelling, palpitations, dizziness or syncope.  Pt denies new neurological symptoms such as new headache, or facial or extremity weakness or numbness   Pt denies polydipsia, polyuria,   Pt states overall good compliance with meds, trying to follow lower cholesterol, diabetic diet, wt overall stable but little exercise however. No other new complaints.  Unable to lose wt.   Pt denies fever, wt loss, night sweats, loss of appetite, or other constitutional symptoms Past Medical History  Diagnosis Date  . GLUCOSE INTOLERANCE 01/24/2009  . HYPERCHOLESTEROLEMIA 12/09/2006  . HYPERLIPIDEMIA 12/18/2006  . GOUT 12/09/2006  . HYPERTENSION 12/09/2006  . ALLERGIC RHINITIS 12/09/2006  . GERD 12/09/2006  . MENOPAUSAL DISORDER 01/25/2008  . DEGENERATIVE JOINT DISEASE, LEFT KNEE 01/23/2007  . Encephalocele 01/23/2007  . FEVER UNSPECIFIED 01/28/2010  . Impaired glucose tolerance 07/26/2010   Past Surgical History  Procedure Date  . S/p anterior encephalocele repair and craniotomy 2003    reports that she has never smoked. She does not have any smokeless tobacco history on file. She reports that she does not drink alcohol or use illicit drugs. family history includes Hypertension in her other. Allergies  Allergen Reactions  . Codeine   . Lovastatin   . Pravastatin Sodium    Current Outpatient Prescriptions on File Prior to Visit  Medication Sig Dispense Refill  . aspirin 325 MG EC tablet Take 325 mg by mouth daily.        Marland Kitchen atorvastatin (LIPITOR) 40 MG tablet Take 1 tablet (40 mg total) by mouth daily.  30 tablet  11  . benazepril (LOTENSIN) 40 MG tablet Take 1 tablet (40 mg total) by mouth daily.  90 tablet  3  . carvedilol (COREG) 25 MG tablet Take 1 tablet (25 mg total) by mouth 2 (two)  times daily.  180 tablet  3  . furosemide (LASIX) 40 MG tablet Take 1 tablet (40 mg total) by mouth daily.  90 tablet  3   Current Facility-Administered Medications on File Prior to Visit  Medication Dose Route Frequency Provider Last Rate Last Dose  . pneumococcal 23 valent vaccine (PNU-IMMUNE) injection 0.5 mL  0.5 mL Intramuscular Once Corwin Levins, MD       Review of Systems Review of Systems  Constitutional: Negative for diaphoresis and unexpected weight change.  Respiratory: Negative for choking and stridor.   Gastrointestinal: Negative for vomiting and blood in stool.  Genitourinary: Negative for hematuria and decreased urine volume.  Musculoskeletal: Negative for gait problem.  Skin: Negative for color change and wound.  Neurological: Negative for tremors and numbness.  Psychiatric/Behavioral: Negative for decreased concentration. The patient is not hyperactive.      Objective:   Physical Exam BP 138/100  Pulse 75  Temp(Src) 98.1 F (36.7 C) (Oral)  Ht 5\' 2"  (1.575 m)  Wt 259 lb 6 oz (117.652 kg)  BMI 47.44 kg/m2  SpO2 98% Physical Exam  VS noted Constitutional: Pt appears well-developed and well-nourished. Lavella Lemons HENT: Head: Normocephalic.  Right Ear: External ear normal.  Left Ear: External ear normal.  Eyes: Conjunctivae and EOM are normal. Pupils are equal, round, and reactive to light.  Neck: Normal range of motion. Neck supple.  Cardiovascular: Normal rate and regular rhythm.   Pulmonary/Chest: Effort  normal and breath sounds normal.  Abd:  Soft, NT, non-distended, + BS Neurological: Pt is alert. No cranial nerve deficit.  Skin: Skin is warm. No erythema.  Psychiatric: Pt behavior is normal. Thought content normal.     Assessment & Plan:

## 2011-07-30 NOTE — Patient Instructions (Signed)
Please increase the amlodipine to 10 mg per day Continue all other medications as before Please have the pharmacy call with any refills you may need. Please return in 6 mo with Lab testing done 3-5 days before

## 2011-08-02 ENCOUNTER — Encounter: Payer: Self-pay | Admitting: Internal Medicine

## 2011-08-02 NOTE — Assessment & Plan Note (Signed)
stable overall by hx and exam, most recent data reviewed with pt, and pt to continue medical treatment as before  Lab Results  Component Value Date   LDLCALC 69 07/27/2011

## 2011-08-02 NOTE — Assessment & Plan Note (Signed)
stable overall by hx and exam, most recent data reviewed with pt, and pt to continue medical treatment as before  Lab Results  Component Value Date   HGBA1C 6.4 07/27/2011

## 2012-01-28 ENCOUNTER — Other Ambulatory Visit (INDEPENDENT_AMBULATORY_CARE_PROVIDER_SITE_OTHER): Payer: Medicare Other

## 2012-01-28 ENCOUNTER — Ambulatory Visit (INDEPENDENT_AMBULATORY_CARE_PROVIDER_SITE_OTHER): Payer: Medicare Other | Admitting: Internal Medicine

## 2012-01-28 ENCOUNTER — Encounter: Payer: Self-pay | Admitting: Internal Medicine

## 2012-01-28 VITALS — BP 150/100 | HR 82 | Temp 98.4°F | Ht 62.0 in | Wt 255.2 lb

## 2012-01-28 DIAGNOSIS — Z23 Encounter for immunization: Secondary | ICD-10-CM

## 2012-01-28 DIAGNOSIS — R7309 Other abnormal glucose: Secondary | ICD-10-CM

## 2012-01-28 DIAGNOSIS — I10 Essential (primary) hypertension: Secondary | ICD-10-CM

## 2012-01-28 DIAGNOSIS — R7302 Impaired glucose tolerance (oral): Secondary | ICD-10-CM

## 2012-01-28 DIAGNOSIS — Z0000 Encounter for general adult medical examination without abnormal findings: Secondary | ICD-10-CM

## 2012-01-28 DIAGNOSIS — E785 Hyperlipidemia, unspecified: Secondary | ICD-10-CM

## 2012-01-28 LAB — CBC WITH DIFFERENTIAL/PLATELET
Basophils Absolute: 0 10*3/uL (ref 0.0–0.1)
Basophils Relative: 0.5 % (ref 0.0–3.0)
Eosinophils Absolute: 0.2 10*3/uL (ref 0.0–0.7)
Eosinophils Relative: 2.4 % (ref 0.0–5.0)
HCT: 41.6 % (ref 36.0–46.0)
Hemoglobin: 13.1 g/dL (ref 12.0–15.0)
Lymphocytes Relative: 28.1 % (ref 12.0–46.0)
Lymphs Abs: 2.1 10*3/uL (ref 0.7–4.0)
MCHC: 31.6 g/dL (ref 30.0–36.0)
MCV: 89 fl (ref 78.0–100.0)
Monocytes Absolute: 0.8 10*3/uL (ref 0.1–1.0)
Monocytes Relative: 11 % (ref 3.0–12.0)
Neutro Abs: 4.4 10*3/uL (ref 1.4–7.7)
Neutrophils Relative %: 58 % (ref 43.0–77.0)
Platelets: 277 10*3/uL (ref 150.0–400.0)
RBC: 4.67 Mil/uL (ref 3.87–5.11)
RDW: 15.2 % — ABNORMAL HIGH (ref 11.5–14.6)
WBC: 7.6 10*3/uL (ref 4.5–10.5)

## 2012-01-28 LAB — URINALYSIS, ROUTINE W REFLEX MICROSCOPIC
Bilirubin Urine: NEGATIVE
Ketones, ur: NEGATIVE
Nitrite: NEGATIVE
Specific Gravity, Urine: 1.015 (ref 1.000–1.030)
Total Protein, Urine: NEGATIVE
Urine Glucose: NEGATIVE
Urobilinogen, UA: 0.2 (ref 0.0–1.0)
pH: 5.5 (ref 5.0–8.0)

## 2012-01-28 LAB — BASIC METABOLIC PANEL
BUN: 14 mg/dL (ref 6–23)
CO2: 30 mEq/L (ref 19–32)
Calcium: 9 mg/dL (ref 8.4–10.5)
Chloride: 106 mEq/L (ref 96–112)
Creatinine, Ser: 0.9 mg/dL (ref 0.4–1.2)
GFR: 77.2 mL/min (ref >60.00)
Glucose, Bld: 114 mg/dL — ABNORMAL HIGH (ref 70–99)
Potassium: 3.9 mEq/L (ref 3.5–5.1)
Sodium: 143 mEq/L (ref 135–145)

## 2012-01-28 LAB — HEMOGLOBIN A1C: Hgb A1c MFr Bld: 6.4 % (ref 4.6–6.5)

## 2012-01-28 LAB — LIPID PANEL
Cholesterol: 133 mg/dL (ref 0–200)
HDL: 47.3 mg/dL (ref >39.00)
LDL Cholesterol: 63 mg/dL (ref 0–99)
Total CHOL/HDL Ratio: 3
Triglycerides: 116 mg/dL (ref 0.0–149.0)
VLDL: 23.2 mg/dL (ref 0.0–40.0)

## 2012-01-28 LAB — HEPATIC FUNCTION PANEL
ALT: 27 U/L (ref 0–35)
AST: 30 U/L (ref 0–37)
Albumin: 3.3 g/dL — ABNORMAL LOW (ref 3.5–5.2)
Alkaline Phosphatase: 66 U/L (ref 39–117)
Bilirubin, Direct: 0.2 mg/dL (ref 0.0–0.3)
Total Bilirubin: 0.8 mg/dL (ref 0.3–1.2)
Total Protein: 7.2 g/dL (ref 6.0–8.3)

## 2012-01-28 LAB — TSH: TSH: 2.75 u[IU]/mL (ref 0.35–5.50)

## 2012-01-28 MED ORDER — FUROSEMIDE 40 MG PO TABS: 40.0000 mg | ORAL_TABLET | Freq: Every day | ORAL | Status: DC

## 2012-01-28 MED ORDER — BENAZEPRIL HCL 40 MG PO TABS: 40.0000 mg | ORAL_TABLET | Freq: Every day | ORAL | Status: DC

## 2012-01-28 MED ORDER — AMLODIPINE BESYLATE 5 MG PO TABS: 5.0000 mg | ORAL_TABLET | Freq: Every day | ORAL | Status: DC

## 2012-01-28 MED ORDER — CARVEDILOL 25 MG PO TABS: 25.0000 mg | ORAL_TABLET | Freq: Two times a day (BID) | ORAL | Status: DC

## 2012-01-28 MED ORDER — ATORVASTATIN CALCIUM 40 MG PO TABS: 40.0000 mg | ORAL_TABLET | Freq: Every day | ORAL | Status: DC

## 2012-01-28 NOTE — Progress Notes (Signed)
Subjective:    Patient ID: Heather [name], female    DOB: December 15, 1928, 78 y.o.   MRN: [id number]  HPI  Here for wellness and f/u;  Overall doing ok;  Pt denies CP, worsening SOB, DOE, wheezing, orthopnea, PND,  palpitations, dizziness or syncope, although did have worsening LE edema with the amolod 10 mg so stopped it.   Pt denies neurological change such as new Headache, facial or extremity weakness.  Pt denies polydipsia, polyuria, or low sugar symptoms. Pt states overall good compliance with treatment and medications, good tolerability, and trying to follow lower cholesterol diet.  Pt denies worsening depressive symptoms, suicidal ideation or panic. No fever, wt loss, night sweats, loss of appetite, or other constitutional symptoms.  Pt states good ability with ADL's, low fall risk, home safety reviewed and adequate, no significant changes in hearing or vision, and occasionally active with exercise.  Walks with cane. Cognition remains very good for age.  Did have a "pimple" near left ear opening and wants looked at, drained a few days ago.  Was taking ibuprofen recently for the pain.   Past Medical History  Diagnosis Date  . GLUCOSE INTOLERANCE 01/24/2009  . HYPERCHOLESTEROLEMIA 12/09/2006  . HYPERLIPIDEMIA 12/18/2006  . GOUT 12/09/2006  . HYPERTENSION 12/09/2006  . ALLERGIC RHINITIS 12/09/2006  . GERD 12/09/2006  . MENOPAUSAL DISORDER 01/25/2008  . DEGENERATIVE JOINT DISEASE, LEFT KNEE 01/23/2007  . Encephalocele 01/23/2007  . FEVER UNSPECIFIED 01/28/2010  . Impaired glucose tolerance 07/26/2010   Past Surgical History  Procedure Date  . S/p anterior encephalocele repair and craniotomy 2003    reports that she has never smoked. She does not have any smokeless tobacco history on file. She reports that she does not drink alcohol or use illicit drugs. family history includes Hypertension in her other. Allergies  Allergen Reactions  . Codeine   . Lovastatin   . Pravastatin Sodium    Current  Outpatient Prescriptions on File Prior to Visit  Medication Sig Dispense Refill  . aspirin 325 MG EC tablet Take 325 mg by mouth daily.        Marland Kitchen atorvastatin (LIPITOR) 40 MG tablet Take 1 tablet (40 mg total) by mouth daily.  90 tablet  3  . benazepril (LOTENSIN) 40 MG tablet Take 1 tablet (40 mg total) by mouth daily.  90 tablet  3  . carvedilol (COREG) 25 MG tablet Take 1 tablet (25 mg total) by mouth 2 (two) times daily.  180 tablet  3  . furosemide (LASIX) 40 MG tablet Take 1 tablet (40 mg total) by mouth daily.  90 tablet  3   Review of Systems Review of Systems  Constitutional: Negative for diaphoresis, activity change, appetite change and unexpected weight change.  HENT: Negative for hearing loss, ear pain, facial swelling, mouth sores and neck stiffness.   Eyes: Negative for pain, redness and visual disturbance.  Respiratory: Negative for shortness of breath and wheezing.   Cardiovascular: Negative for chest pain and palpitations.  Gastrointestinal: Negative for diarrhea, blood in stool, abdominal distention and rectal pain.  Genitourinary: Negative for hematuria, flank pain and decreased urine volume.  Musculoskeletal: Negative for myalgias and joint swelling.  Skin: Negative for color change and wound.  Neurological: Negative for syncope and numbness.  Hematological: Negative for adenopathy.  Psychiatric/Behavioral: Negative for hallucinations, self-injury, decreased concentration and agitation.      Objective:   Physical Exam BP 150/100  Pulse 82  Temp 98.4 F (36.9 C) (Oral)  Ht 5'  2" (1.575 m)  Wt 255 lb 4 oz (115.781 kg)  BMI 46.69 kg/m2  SpO2 98% Physical Exam  VS noted Constitutional: Pt is oriented to person, place, and time. Appears well-developed and well-nourished.  HENT:  Head: Normocephalic and atraumatic.  Right Ear: External ear normal.  Left Ear: External ear normal.  Nose: Nose normal.  Mouth/Throat: Oropharynx is clear and moist.  Eyes:  Conjunctivae and EOM are normal. Pupils are equal, round, and reactive to light.  Neck: Normal range of motion. Neck supple. No JVD present. No tracheal deviation present.  Cardiovascular: Normal rate, regular rhythm, normal heart sounds and intact distal pulses.   Pulmonary/Chest: Effort normal and breath sounds normal.  Abdominal: Soft. Bowel sounds are normal. There is no tenderness.  Musculoskeletal: Normal range of motion. Exhibits no edema.  Lymphadenopathy:  Has no cervical adenopathy.  Neurological: Pt is alert and oriented to person, place, and time. Pt has normal reflexes. No cranial nerve deficit.  Skin: Skin is warm and dry. No rash noted. trace LE edema bilat Psychiatric:  Has  normal mood and affect. Behavior is normal.     Assessment & Plan:

## 2012-01-28 NOTE — Patient Instructions (Addendum)
You had the flu shot today Please re-start the amlodipine at 5 mg per day due to the swelling with the 10 mg Your medications were all refilled today Please go to LAB in the Basement for the blood and/or urine tests to be done today You will be contacted by phone if any changes need to be made immediately.  Otherwise, you will receive a letter about your results with an explanation Please remember to sign up for My Chart at your earliest convenience, as this will be important to you in the future with finding out test results. Please return in 6 mo with Lab testing done 3-5 days before

## 2012-01-30 ENCOUNTER — Encounter: Payer: Self-pay | Admitting: Internal Medicine

## 2012-01-30 NOTE — Assessment & Plan Note (Signed)
stable overall by hx and exam, most recent data reviewed with pt, and pt to continue medical treatment as before Lab Results  Component Value Date   HGBA1C 6.4 01/28/2012

## 2012-01-30 NOTE — Assessment & Plan Note (Addendum)
stable overall by hx and exam, most recent data reviewed with pt, and pt to res-tart amloipdine at 5mg , as the 10 mg caused increased LE edema BP Readings from Last 3 Encounters:  01/28/12 150/100  07/30/11 138/100  01/29/11 154/100

## 2012-01-30 NOTE — Assessment & Plan Note (Signed)

## 2012-07-28 ENCOUNTER — Ambulatory Visit (INDEPENDENT_AMBULATORY_CARE_PROVIDER_SITE_OTHER): Payer: Medicare Other | Admitting: Internal Medicine

## 2012-07-28 ENCOUNTER — Ambulatory Visit (INDEPENDENT_AMBULATORY_CARE_PROVIDER_SITE_OTHER): Payer: Medicare Other

## 2012-07-28 ENCOUNTER — Encounter: Payer: Self-pay | Admitting: Internal Medicine

## 2012-07-28 VITALS — BP 152/100 | HR 73 | Temp 98.0°F | Ht 64.0 in | Wt 253.5 lb

## 2012-07-28 DIAGNOSIS — I10 Essential (primary) hypertension: Secondary | ICD-10-CM

## 2012-07-28 DIAGNOSIS — R7309 Other abnormal glucose: Secondary | ICD-10-CM

## 2012-07-28 DIAGNOSIS — E785 Hyperlipidemia, unspecified: Secondary | ICD-10-CM

## 2012-07-28 DIAGNOSIS — R7302 Impaired glucose tolerance (oral): Secondary | ICD-10-CM

## 2012-07-28 LAB — HEPATIC FUNCTION PANEL
ALT: 16 U/L (ref 0–35)
AST: 19 U/L (ref 0–37)
Albumin: 3.5 g/dL (ref 3.5–5.2)
Alkaline Phosphatase: 59 U/L (ref 39–117)
Bilirubin, Direct: 0.1 mg/dL (ref 0.0–0.3)
Total Bilirubin: 1 mg/dL (ref 0.3–1.2)
Total Protein: 7.3 g/dL (ref 6.0–8.3)

## 2012-07-28 LAB — LIPID PANEL
Cholesterol: 224 mg/dL — ABNORMAL HIGH (ref 0–200)
HDL: 44.8 mg/dL (ref >39.00)
Total CHOL/HDL Ratio: 5
Triglycerides: 131 mg/dL (ref 0.0–149.0)
VLDL: 26.2 mg/dL (ref 0.0–40.0)

## 2012-07-28 LAB — BASIC METABOLIC PANEL
BUN: 13 mg/dL (ref 6–23)
CO2: 33 mEq/L — ABNORMAL HIGH (ref 19–32)
Calcium: 9.1 mg/dL (ref 8.4–10.5)
Chloride: 102 mEq/L (ref 96–112)
Creatinine, Ser: 0.9 mg/dL (ref 0.4–1.2)
GFR: 80.18 mL/min (ref >60.00)
Glucose, Bld: 107 mg/dL — ABNORMAL HIGH (ref 70–99)
Potassium: 3.5 mEq/L (ref 3.5–5.1)
Sodium: 141 mEq/L (ref 135–145)

## 2012-07-28 LAB — HEMOGLOBIN A1C: Hgb A1c MFr Bld: 6.4 % (ref 4.6–6.5)

## 2012-07-28 NOTE — Progress Notes (Signed)
Subjective:    Patient ID: Heather [name], female    DOB: Sep 19, 1928, 79 y.o.   MRN: [id number]  HPI  Here to f/u; overall doing ok,  Pt denies chest pain, increased sob or doe, wheezing, orthopnea, PND, increased LE swelling, palpitations, dizziness or syncope.  Pt denies polydipsia, polyuria, or low sugar symptoms such as weakness or confusion improved with po intake.  Pt denies new neurological symptoms such as new headache, or facial or extremity weakness or numbness.   Pt states overall good compliance with meds, has been trying to follow lower cholesterol, diabetic diet, with wt overall stable,  but little exercise however.  No new complaints Past Medical History  Diagnosis Date  . GLUCOSE INTOLERANCE 01/24/2009  . HYPERCHOLESTEROLEMIA 12/09/2006  . HYPERLIPIDEMIA 12/18/2006  . GOUT 12/09/2006  . HYPERTENSION 12/09/2006  . ALLERGIC RHINITIS 12/09/2006  . GERD 12/09/2006  . MENOPAUSAL DISORDER 01/25/2008  . DEGENERATIVE JOINT DISEASE, LEFT KNEE 01/23/2007  . Encephalocele 01/23/2007  . FEVER UNSPECIFIED 01/28/2010  . Impaired glucose tolerance 07/26/2010   Past Surgical History  Procedure Laterality Date  . S/p anterior encephalocele repair and craniotomy  2003    reports that she has never smoked. She does not have any smokeless tobacco history on file. She reports that she does not drink alcohol or use illicit drugs. family history includes Hypertension in her other. Allergies  Allergen Reactions  . Codeine   . Lovastatin   . Pravastatin Sodium    Current Outpatient Prescriptions on File Prior to Visit  Medication Sig Dispense Refill  . amLODipine (NORVASC) 5 MG tablet Take 1 tablet (5 mg total) by mouth daily.  90 tablet  3  . aspirin 325 MG EC tablet Take 325 mg by mouth daily.        Marland Kitchen atorvastatin (LIPITOR) 40 MG tablet Take 1 tablet (40 mg total) by mouth daily.  90 tablet  3  . benazepril (LOTENSIN) 40 MG tablet Take 1 tablet (40 mg total) by mouth daily.  90 tablet  3  .  carvedilol (COREG) 25 MG tablet Take 1 tablet (25 mg total) by mouth 2 (two) times daily.  180 tablet  3  . furosemide (LASIX) 40 MG tablet Take 1 tablet (40 mg total) by mouth daily.  90 tablet  3   No current facility-administered medications on file prior to visit.   Review of Systems  Constitutional: Negative for unexpected weight change, or unusual diaphoresis  HENT: Negative for tinnitus.   Eyes: Negative for photophobia and visual disturbance.  Respiratory: Negative for choking and stridor.   Gastrointestinal: Negative for vomiting and blood in stool.  Genitourinary: Negative for hematuria and decreased urine volume.  Musculoskeletal: Negative for acute joint swelling Skin: Negative for color change and wound.  Neurological: Negative for tremors and numbness other than noted  Psychiatric/Behavioral: Negative for decreased concentration or  hyperactivity.       Objective:   Physical Exam BP 152/100  Pulse 73  Temp(Src) 98 F (36.7 C) (Oral)  Ht 5\' 4"  (1.626 m)  Wt 253 lb 8 oz (114.987 kg)  BMI 43.49 kg/m2  SpO2 92% VS noted,  Constitutional: Pt appears well-developed and well-nourished.  HENT: Head: NCAT.  Right Ear: External ear normal.  Left Ear: External ear normal.  Eyes: Conjunctivae and EOM are normal. Pupils are equal, round, and reactive to light.  Neck: Normal range of motion. Neck supple.  Cardiovascular: Normal rate and regular rhythm.   Pulmonary/Chest: Effort normal and  breath sounds normal.  Abd:  Soft, NT, non-distended, + BS Neurological: Pt is alert. Not confused  Skin: Skin is warm. No erythema.  Psychiatric: Pt behavior is normal. Thought content normal.     Assessment & Plan:

## 2012-07-28 NOTE — Assessment & Plan Note (Signed)
stable overall by history and exam, recent data reviewed with pt, and pt to continue medical treatment as before,  to f/u any worsening symptoms or concerns Lab Results  Component Value Date   HGBA1C 6.4 01/28/2012

## 2012-07-28 NOTE — Assessment & Plan Note (Signed)
stable overall by history and exam, recent data reviewed with pt, and pt to continue medical treatment as before,  to f/u any worsening symptoms or concerns Lab Results  Component Value Date   LDLCALC 63 01/28/2012

## 2012-07-28 NOTE — Patient Instructions (Addendum)
Please continue all other medications as before, and refills have been done if requested. Please have the pharmacy call with any other refills you may need. Please go to the LAB in the Basement (turn left off the elevator) for the tests to be done today You will be contacted by phone if any changes need to be made immediately.  Otherwise, you will receive a letter about your results with an explanation Please remember to sign up for My Chart if you have not done so, as this will be important to you in the future with finding out test results, communicating by private email, and scheduling acute appointments online when needed. Please return in 6 months, or sooner if needed

## 2012-07-28 NOTE — Assessment & Plan Note (Signed)
Mild elev today but o/w stable overall by history and exam, recent data reviewed with pt, and pt to continue medical treatment as before, declines med change,  to f/u any worsening symptoms or concerns BP Readings from Last 3 Encounters:  07/28/12 152/100  01/28/12 150/100  07/30/11 138/100

## 2012-07-29 ENCOUNTER — Encounter: Payer: Self-pay | Admitting: Internal Medicine

## 2012-07-29 LAB — LDL CHOLESTEROL, DIRECT: Direct LDL: 157.2 mg/dL

## 2013-01-27 ENCOUNTER — Ambulatory Visit: Payer: Medicare Other | Admitting: Internal Medicine

## 2013-01-31 ENCOUNTER — Ambulatory Visit (INDEPENDENT_AMBULATORY_CARE_PROVIDER_SITE_OTHER): Payer: Medicare Other | Admitting: Internal Medicine

## 2013-01-31 ENCOUNTER — Other Ambulatory Visit (INDEPENDENT_AMBULATORY_CARE_PROVIDER_SITE_OTHER): Payer: Medicare Other

## 2013-01-31 ENCOUNTER — Encounter: Payer: Self-pay | Admitting: Internal Medicine

## 2013-01-31 VITALS — BP 142/90 | HR 72 | Temp 98.2°F | Ht 62.0 in | Wt 256.0 lb

## 2013-01-31 DIAGNOSIS — R7309 Other abnormal glucose: Secondary | ICD-10-CM

## 2013-01-31 DIAGNOSIS — E785 Hyperlipidemia, unspecified: Secondary | ICD-10-CM

## 2013-01-31 DIAGNOSIS — Z23 Encounter for immunization: Secondary | ICD-10-CM

## 2013-01-31 DIAGNOSIS — R7302 Impaired glucose tolerance (oral): Secondary | ICD-10-CM

## 2013-01-31 DIAGNOSIS — Z0000 Encounter for general adult medical examination without abnormal findings: Secondary | ICD-10-CM

## 2013-01-31 DIAGNOSIS — I10 Essential (primary) hypertension: Secondary | ICD-10-CM

## 2013-01-31 LAB — URINALYSIS, ROUTINE W REFLEX MICROSCOPIC
Bilirubin Urine: NEGATIVE
Hgb urine dipstick: NEGATIVE
Ketones, ur: NEGATIVE
Nitrite: POSITIVE
Specific Gravity, Urine: 1.025 (ref 1.000–1.030)
Urine Glucose: NEGATIVE
Urobilinogen, UA: 0.2 (ref 0.0–1.0)
pH: 6 (ref 5.0–8.0)

## 2013-01-31 LAB — CBC WITH DIFFERENTIAL/PLATELET
Basophils Absolute: 0.1 10*3/uL (ref 0.0–0.1)
Basophils Relative: 1 % (ref 0.0–3.0)
Eosinophils Absolute: 0.3 10*3/uL (ref 0.0–0.7)
Eosinophils Relative: 3.9 % (ref 0.0–5.0)
HCT: 43.4 % (ref 36.0–46.0)
Hemoglobin: 14.1 g/dL (ref 12.0–15.0)
Lymphocytes Relative: 37.4 % (ref 12.0–46.0)
Lymphs Abs: 2.4 10*3/uL (ref 0.7–4.0)
MCHC: 32.5 g/dL (ref 30.0–36.0)
MCV: 88.1 fl (ref 78.0–100.0)
Monocytes Absolute: 0.9 10*3/uL (ref 0.1–1.0)
Monocytes Relative: 13.3 % — ABNORMAL HIGH (ref 3.0–12.0)
Neutro Abs: 2.9 10*3/uL (ref 1.4–7.7)
Neutrophils Relative %: 44.4 % (ref 43.0–77.0)
Platelets: 279 10*3/uL (ref 150.0–400.0)
RBC: 4.92 Mil/uL (ref 3.87–5.11)
RDW: 15 % — ABNORMAL HIGH (ref 11.5–14.6)
WBC: 6.5 10*3/uL (ref 4.5–10.5)

## 2013-01-31 LAB — LIPID PANEL
Cholesterol: 256 mg/dL — ABNORMAL HIGH (ref 0–200)
HDL: 52.2 mg/dL (ref >39.00)
Total CHOL/HDL Ratio: 5
Triglycerides: 168 mg/dL — ABNORMAL HIGH (ref 0.0–149.0)
VLDL: 33.6 mg/dL (ref 0.0–40.0)

## 2013-01-31 LAB — HEPATIC FUNCTION PANEL
ALT: 16 U/L (ref 0–35)
AST: 24 U/L (ref 0–37)
Albumin: 3.7 g/dL (ref 3.5–5.2)
Alkaline Phosphatase: 59 U/L (ref 39–117)
Bilirubin, Direct: 0.1 mg/dL (ref 0.0–0.3)
Total Bilirubin: 1 mg/dL (ref 0.3–1.2)
Total Protein: 7.4 g/dL (ref 6.0–8.3)

## 2013-01-31 LAB — BASIC METABOLIC PANEL
BUN: 16 mg/dL (ref 6–23)
CO2: 29 mEq/L (ref 19–32)
Calcium: 9.4 mg/dL (ref 8.4–10.5)
Chloride: 100 mEq/L (ref 96–112)
Creatinine, Ser: 1 mg/dL (ref 0.4–1.2)
GFR: 70.63 mL/min (ref >60.00)
Glucose, Bld: 107 mg/dL — ABNORMAL HIGH (ref 70–99)
Potassium: 3.9 mEq/L (ref 3.5–5.1)
Sodium: 138 mEq/L (ref 135–145)

## 2013-01-31 LAB — HEMOGLOBIN A1C: Hgb A1c MFr Bld: 6.3 % (ref 4.6–6.5)

## 2013-01-31 LAB — TSH: TSH: 2.81 u[IU]/mL (ref 0.35–5.50)

## 2013-01-31 LAB — LDL CHOLESTEROL, DIRECT: Direct LDL: 171.8 mg/dL

## 2013-01-31 NOTE — Assessment & Plan Note (Signed)

## 2013-01-31 NOTE — Assessment & Plan Note (Signed)
stable overall by history and exam, recent data reviewed with pt, and pt to continue medical treatment as before,  to f/u any worsening symptoms or concerns  BP Readings from Last 3 Encounters:  01/31/13 142/90  07/28/12 152/100  01/28/12 150/100

## 2013-01-31 NOTE — Assessment & Plan Note (Signed)
Also for a1c 

## 2013-01-31 NOTE — Progress Notes (Signed)
Subjective:    Patient ID: Heather [name], female    DOB: [birth year], 79 y.o.   MRN: [id number]  HPI  Here for wellness and f/u;  Overall doing ok;  Pt denies CP, worsening SOB, DOE, wheezing, orthopnea, PND, worsening LE edema, palpitations, dizziness or syncope.  Pt denies neurological change such as new headache, facial or extremity weakness.  Pt denies polydipsia, polyuria, or low sugar symptoms. Pt states overall good compliance with treatment and medications, good tolerability, and has been trying to follow lower cholesterol diet.  Pt denies worsening depressive symptoms, suicidal ideation or panic. No fever, night sweats, wt loss, loss of appetite, or other constitutional symptoms.  Pt states good ability with ADL's, has low fall risk, home safety reviewed and adequate, no other significant changes in hearing or vision, and only occasionally active with exercise.  Walks with cane today. Has some balance worsening in the past yr, o/w no complaints. No overall uncontrolled pain Past Medical History  Diagnosis Date  . GLUCOSE INTOLERANCE 01/24/2009  . HYPERCHOLESTEROLEMIA 12/09/2006  . HYPERLIPIDEMIA 12/18/2006  . GOUT 12/09/2006  . HYPERTENSION 12/09/2006  . ALLERGIC RHINITIS 12/09/2006  . GERD 12/09/2006  . MENOPAUSAL DISORDER 01/25/2008  . DEGENERATIVE JOINT DISEASE, LEFT KNEE 01/23/2007  . Encephalocele 01/23/2007  . FEVER UNSPECIFIED 01/28/2010  . Impaired glucose tolerance 07/26/2010   Past Surgical History  Procedure Laterality Date  . S/p anterior encephalocele repair and craniotomy  2003    reports that she has never smoked. She does not have any smokeless tobacco history on file. She reports that she does not drink alcohol or use illicit drugs. family history includes Hypertension in her other. Allergies  Allergen Reactions  . Codeine   . Lovastatin   . Pravastatin Sodium    Current Outpatient Prescriptions on File Prior to Visit  Medication Sig Dispense Refill  . amLODipine  (NORVASC) 5 MG tablet Take 1 tablet (5 mg total) by mouth daily.  90 tablet  3  . aspirin 325 MG EC tablet Take 325 mg by mouth daily.        . benazepril (LOTENSIN) 40 MG tablet Take 1 tablet (40 mg total) by mouth daily.  90 tablet  3  . carvedilol (COREG) 25 MG tablet Take 1 tablet (25 mg total) by mouth 2 (two) times daily.  180 tablet  3  . furosemide (LASIX) 40 MG tablet Take 1 tablet (40 mg total) by mouth daily.  90 tablet  3  . atorvastatin (LIPITOR) 40 MG tablet Take 1 tablet (40 mg total) by mouth daily.  90 tablet  3   No current facility-administered medications on file prior to visit.   Review of Systems Constitutional: Negative for diaphoresis, activity change, appetite change or unexpected weight change.  HENT: Negative for hearing loss, ear pain, facial swelling, mouth sores and neck stiffness.   Eyes: Negative for pain, redness and visual disturbance.  Respiratory: Negative for shortness of breath and wheezing.   Cardiovascular: Negative for chest pain and palpitations.  Gastrointestinal: Negative for diarrhea, blood in stool, abdominal distention or other pain Genitourinary: Negative for hematuria, flank pain or change in urine volume.  Musculoskeletal: Negative for myalgias and joint swelling.  Skin: Negative for color change and wound.  Neurological: Negative for syncope and numbness. other than noted Hematological: Negative for adenopathy.  Psychiatric/Behavioral: Negative for hallucinations, self-injury, decreased concentration and agitation.      Objective:   Physical Exam BP 142/90  Pulse 72  Temp(Src) 98.2 F (  36.8 C) (Oral)  Ht 5\' 2"  (1.575 m)  Wt 256 lb (116.121 kg)  BMI 46.81 kg/m2  SpO2 98% VS noted, walks with cane Constitutional: Pt is oriented to person, place, and time. Appears well-developed and well-nourished. Lavella Lemons Head: Normocephalic and atraumatic.  Right Ear: External ear normal.  Left Ear: External ear normal.  Nose: Nose normal.   Mouth/Throat: Oropharynx is clear and moist.  Eyes: Conjunctivae and EOM are normal. Pupils are equal, round, and reactive to light.  Neck: Normal range of motion. Neck supple. No JVD present. No tracheal deviation present.  Cardiovascular: Normal rate, regular rhythm, normal heart sounds and intact distal pulses.   Pulmonary/Chest: Effort normal and breath sounds normal.  Abdominal: Soft. Bowel sounds are normal. There is no tenderness. No HSM  Musculoskeletal: Normal range of motion. Exhibits no edema.  Lymphadenopathy:  Has no cervical adenopathy.  Neurological: Pt is alert and oriented to person, place, and time. Pt has normal reflexes. No cranial nerve deficit.  Skin: Skin is warm and dry. No rash noted.  Psychiatric:  Has  normal mood and affect. Behavior is normal.     Assessment & Plan:

## 2013-01-31 NOTE — Patient Instructions (Addendum)
You had the flu shot today Please continue all other medications as before, and refills have been done if requested. Please have the pharmacy call with any other refills you may need. Please continue your efforts at being more active, low cholesterol diet, and weight control. You are otherwise up to date with prevention measures today. Please go to the LAB in the Basement (turn left off the elevator) for the tests to be done today You will be contacted by phone if any changes need to be made immediately.  Otherwise, you will receive a letter about your results with an explanation, but please check with MyChart first.  Please remember to sign up for My Chart if you have not done so, as this will be important to you in the future with finding out test results, communicating by private email, and scheduling acute appointments online when needed.  Please return in 6 months, or sooner if needed

## 2013-02-01 ENCOUNTER — Encounter: Payer: Self-pay | Admitting: Internal Medicine

## 2013-08-01 ENCOUNTER — Ambulatory Visit (INDEPENDENT_AMBULATORY_CARE_PROVIDER_SITE_OTHER): Payer: Medicare Other | Admitting: Internal Medicine

## 2013-08-01 ENCOUNTER — Encounter: Payer: Self-pay | Admitting: Internal Medicine

## 2013-08-01 ENCOUNTER — Ambulatory Visit (INDEPENDENT_AMBULATORY_CARE_PROVIDER_SITE_OTHER)
Admission: RE | Admit: 2013-08-01 | Discharge: 2013-08-01 | Disposition: A | Discharge status: Home / Self Care | Payer: Medicare Other | Source: Ambulatory Visit | Attending: Internal Medicine | Admitting: Internal Medicine

## 2013-08-01 VITALS — BP 164/104 | HR 104 | Temp 98.7°F | Wt 258.0 lb

## 2013-08-01 DIAGNOSIS — Z0000 Encounter for general adult medical examination without abnormal findings: Secondary | ICD-10-CM

## 2013-08-01 DIAGNOSIS — Y92009 Unspecified place in unspecified non-institutional (private) residence as the place of occurrence of the external cause: Principal | ICD-10-CM

## 2013-08-01 DIAGNOSIS — Z23 Encounter for immunization: Secondary | ICD-10-CM

## 2013-08-01 DIAGNOSIS — M949 Disorder of cartilage, unspecified: Secondary | ICD-10-CM

## 2013-08-01 DIAGNOSIS — E785 Hyperlipidemia, unspecified: Secondary | ICD-10-CM

## 2013-08-01 DIAGNOSIS — M8580 Other specified disorders of bone density and structure, unspecified site: Secondary | ICD-10-CM

## 2013-08-01 DIAGNOSIS — I10 Essential (primary) hypertension: Secondary | ICD-10-CM

## 2013-08-01 DIAGNOSIS — R7302 Impaired glucose tolerance (oral): Secondary | ICD-10-CM

## 2013-08-01 DIAGNOSIS — W19XXXA Unspecified fall, initial encounter: Secondary | ICD-10-CM

## 2013-08-01 DIAGNOSIS — M899 Disorder of bone, unspecified: Secondary | ICD-10-CM

## 2013-08-01 DIAGNOSIS — M25562 Pain in left knee: Secondary | ICD-10-CM

## 2013-08-01 DIAGNOSIS — R7309 Other abnormal glucose: Secondary | ICD-10-CM

## 2013-08-01 MED ORDER — BENAZEPRIL HCL 40 MG PO TABS
40.0000 mg | ORAL_TABLET | Freq: Every day | ORAL | Status: DC
Start: 2013-08-01 — End: 2014-01-30

## 2013-08-01 MED ORDER — CARVEDILOL 25 MG PO TABS: 25.0000 mg | ORAL_TABLET | Freq: Two times a day (BID) | ORAL | Status: DC

## 2013-08-01 MED ORDER — AMLODIPINE BESYLATE 5 MG PO TABS: 5.0000 mg | ORAL_TABLET | Freq: Every day | ORAL | Status: DC

## 2013-08-01 MED ORDER — FUROSEMIDE 40 MG PO TABS: 40.0000 mg | ORAL_TABLET | Freq: Every day | ORAL | Status: DC

## 2013-08-01 NOTE — Progress Notes (Signed)
   Subjective:    Patient ID: Heather [name], female    DOB: 1928/11/15, 80 y.o.   MRN: [id number]  HPI  Here to f/u with daughter, has ongoing left knee instability for which she has put off any evaluation, fell for the first time incidentally yesterday walking with regular cane, today has her 4 prong cane with her for stability, no injury, just lost balance yesterday, no injury.  Pt denies chest pain, increased sob or doe, wheezing, orthopnea, PND, increased LE swelling, palpitations, dizziness or syncope.  Pt denies new neurological symptoms such as new headache, or facial or extremity weakness or numbness   Pt denies polydipsia, polyuria. Declines PT eval, mammogram, sport med referral for now. Accepts prevnar, and f/u dxa with hx of osteopenia by dxa 2010. Past Medical History  Diagnosis Date  . GLUCOSE INTOLERANCE 01/24/2009  . HYPERCHOLESTEROLEMIA 12/09/2006  . HYPERLIPIDEMIA 12/18/2006  . GOUT 12/09/2006  . HYPERTENSION 12/09/2006  . ALLERGIC RHINITIS 12/09/2006  . GERD 12/09/2006  . MENOPAUSAL DISORDER 01/25/2008  . DEGENERATIVE JOINT DISEASE, LEFT KNEE 01/23/2007  . Encephalocele 01/23/2007  . FEVER UNSPECIFIED 01/28/2010  . Impaired glucose tolerance 07/26/2010   Past Surgical History  Procedure Laterality Date  . S/p anterior encephalocele repair and craniotomy  2003    reports that she has never smoked. She does not have any smokeless tobacco history on file. She reports that she does not drink alcohol or use illicit drugs. family history includes Hypertension in her other. Allergies  Allergen Reactions  . Codeine   . Lipitor [Atorvastatin] Other (See Comments)    myalgia  . Lovastatin   . Pravastatin Sodium    Current Outpatient Prescriptions on File Prior to Visit  Medication Sig Dispense Refill  . aspirin 325 MG EC tablet Take 325 mg by mouth daily.         No current facility-administered medications on file prior to visit.   Review of Systems  Constitutional: Negative  for unexpected weight change, or unusual diaphoresis  HENT: Negative for tinnitus.   Eyes: Negative for photophobia and visual disturbance.  Respiratory: Negative for choking and stridor.   Gastrointestinal: Negative for vomiting and blood in stool.  Genitourinary: Negative for hematuria and decreased urine volume.  Musculoskeletal: Negative for acute joint swelling Skin: Negative for color change and wound.  Neurological: Negative for tremors and numbness other than noted  Psychiatric/Behavioral: Negative for decreased concentration or  hyperactivity.  '    Objective:   Physical Exam BP 164/104  Pulse 104  Temp(Src) 98.7 F (37.1 C) (Oral)  Wt 258 lb (117.028 kg)  SpO2 95% Unable to step up to exam table, fearful of left knee giving out during the pivot to sit VS noted,  Constitutional: Pt appears well-developed and well-nourished.  HENT: Head: NCAT.  Right Ear: External ear normal.  Left Ear: External ear normal.  Eyes: Conjunctivae and EOM are normal. Pupils are equal, round, and reactive to light.  Neck: Normal range of motion. Neck supple.  Cardiovascular: Normal rate and regular rhythm.   Pulmonary/Chest: Effort normal and breath sounds normal.  Abd:  Soft, NT, non-distended, + BS Neurological: Pt is alert. Not confused , motor 5/5 Skin: Skin is warm. No erythema.  Psychiatric: Pt behavior is normal. Thought content normal.     Assessment & Plan:

## 2013-08-01 NOTE — Assessment & Plan Note (Signed)
Statin intol, urged to watch diet  Lab Results  Component Value Date   LDLCALC 63 01/28/2012

## 2013-08-01 NOTE — Assessment & Plan Note (Signed)
For f/u dxa as she is due

## 2013-08-01 NOTE — Assessment & Plan Note (Signed)
Declines sport med or ortho referral

## 2013-08-01 NOTE — Patient Instructions (Addendum)
You had the new Prevnar pneumonia shot today  Please schedule the bone density test before leaving today at the scheduling desk (where you check out)  Please use your 4 prong cane all the time for better stability  Please call if you change your mind about the mammogram  Please call or make appt with Dr Smith/sport med in this office for your left knee if you change your mind  Please call if you would like to be referred for Outpatient Physical Therapy if you change your mine  Please continue all other medications as before, and refills have been done if requested. Please have the pharmacy call with any other refills you may need.  Please continue your efforts at being more active, low cholesterol diet, and weight control.  We can hold on more labs today  Please return in 6 months, or sooner if needed, with Lab testing done 3-5 days before if you are able

## 2013-08-01 NOTE — Assessment & Plan Note (Signed)
asympt - stable overall by history and exam, recent data reviewed with pt, and pt to continue medical treatment as before,  to f/u any worsening symptoms or concerns Lab Results  Component Value Date   HGBA1C 6.3 01/31/2013

## 2013-08-01 NOTE — Assessment & Plan Note (Addendum)
Declines PT referral  Note:  Total time for pt hx, exam, review of record with pt in the room, determination of diagnoses and plan for further eval and tx is > 40 min, with over 50% spent in coordination and counseling of patient

## 2013-08-01 NOTE — Assessment & Plan Note (Signed)
elev today, pt states due to stress from fall, declines further med for now BP Readings from Last 3 Encounters:  08/01/13 164/104  01/31/13 142/90  07/28/12 152/100

## 2013-08-03 ENCOUNTER — Encounter: Payer: Self-pay | Admitting: Internal Medicine

## 2014-01-30 ENCOUNTER — Encounter: Payer: Self-pay | Admitting: Internal Medicine

## 2014-01-30 ENCOUNTER — Ambulatory Visit (INDEPENDENT_AMBULATORY_CARE_PROVIDER_SITE_OTHER): Payer: Medicare Other | Admitting: Internal Medicine

## 2014-01-30 ENCOUNTER — Other Ambulatory Visit (INDEPENDENT_AMBULATORY_CARE_PROVIDER_SITE_OTHER): Payer: Medicare Other

## 2014-01-30 VITALS — BP 152/100 | HR 106 | Temp 98.4°F | Wt 249.0 lb

## 2014-01-30 DIAGNOSIS — R7302 Impaired glucose tolerance (oral): Secondary | ICD-10-CM

## 2014-01-30 DIAGNOSIS — M179 Osteoarthritis of knee, unspecified: Secondary | ICD-10-CM

## 2014-01-30 DIAGNOSIS — Z0000 Encounter for general adult medical examination without abnormal findings: Secondary | ICD-10-CM

## 2014-01-30 DIAGNOSIS — Z23 Encounter for immunization: Secondary | ICD-10-CM

## 2014-01-30 DIAGNOSIS — I10 Essential (primary) hypertension: Secondary | ICD-10-CM

## 2014-01-30 DIAGNOSIS — M1710 Unilateral primary osteoarthritis, unspecified knee: Secondary | ICD-10-CM

## 2014-01-30 DIAGNOSIS — IMO0002 Reserved for concepts with insufficient information to code with codable children: Secondary | ICD-10-CM

## 2014-01-30 LAB — HEPATIC FUNCTION PANEL
ALT: 17 U/L (ref 0–35)
AST: 24 U/L (ref 0–37)
Albumin: 3.2 g/dL — ABNORMAL LOW (ref 3.5–5.2)
Alkaline Phosphatase: 69 U/L (ref 39–117)
Bilirubin, Direct: 0.1 mg/dL (ref 0.0–0.3)
Total Bilirubin: 0.9 mg/dL (ref 0.2–1.2)
Total Protein: 7.8 g/dL (ref 6.0–8.3)

## 2014-01-30 LAB — CBC WITH DIFFERENTIAL/PLATELET
Basophils Absolute: 0.1 10*3/uL (ref 0.0–0.1)
Basophils Relative: 0.8 % (ref 0.0–3.0)
Eosinophils Absolute: 0.3 10*3/uL (ref 0.0–0.7)
Eosinophils Relative: 4.3 % (ref 0.0–5.0)
HCT: 44.2 % (ref 36.0–46.0)
Hemoglobin: 14.1 g/dL (ref 12.0–15.0)
Lymphocytes Relative: 32.8 % (ref 12.0–46.0)
Lymphs Abs: 2.1 10*3/uL (ref 0.7–4.0)
MCHC: 31.9 g/dL (ref 30.0–36.0)
MCV: 88.9 fl (ref 78.0–100.0)
Monocytes Absolute: 0.9 10*3/uL (ref 0.1–1.0)
Monocytes Relative: 13.6 % — ABNORMAL HIGH (ref 3.0–12.0)
Neutro Abs: 3.2 10*3/uL (ref 1.4–7.7)
Neutrophils Relative %: 48.5 % (ref 43.0–77.0)
Platelets: 312 10*3/uL (ref 150.0–400.0)
RBC: 4.97 Mil/uL (ref 3.87–5.11)
RDW: 15.1 % (ref 11.5–15.5)
WBC: 6.5 10*3/uL (ref 4.0–10.5)

## 2014-01-30 LAB — LIPID PANEL
Cholesterol: 257 mg/dL — ABNORMAL HIGH (ref 0–200)
HDL: 47.1 mg/dL (ref >39.00)
LDL Cholesterol: 177 mg/dL — ABNORMAL HIGH (ref 0–99)
NonHDL: 209.9
Total CHOL/HDL Ratio: 5
Triglycerides: 164 mg/dL — ABNORMAL HIGH (ref 0.0–149.0)
VLDL: 32.8 mg/dL (ref 0.0–40.0)

## 2014-01-30 LAB — URINALYSIS, ROUTINE W REFLEX MICROSCOPIC
Bilirubin Urine: NEGATIVE
Hgb urine dipstick: NEGATIVE
Ketones, ur: NEGATIVE
Leukocytes, UA: NEGATIVE
Nitrite: NEGATIVE
Specific Gravity, Urine: 1.02 (ref 1.000–1.030)
Urine Glucose: NEGATIVE
Urobilinogen, UA: 0.2 (ref 0.0–1.0)
pH: 7 (ref 5.0–8.0)

## 2014-01-30 LAB — TSH: TSH: 2.81 u[IU]/mL (ref 0.35–4.50)

## 2014-01-30 LAB — BASIC METABOLIC PANEL
BUN: 16 mg/dL (ref 6–23)
CO2: 32 mEq/L (ref 19–32)
Calcium: 9.3 mg/dL (ref 8.4–10.5)
Chloride: 102 mEq/L (ref 96–112)
Creatinine, Ser: 0.7 mg/dL (ref 0.4–1.2)
GFR: 96.29 mL/min (ref >60.00)
Glucose, Bld: 109 mg/dL — ABNORMAL HIGH (ref 70–99)
Potassium: 3.6 mEq/L (ref 3.5–5.1)
Sodium: 139 mEq/L (ref 135–145)

## 2014-01-30 LAB — HEMOGLOBIN A1C: Hgb A1c MFr Bld: 6.1 % (ref 4.6–6.5)

## 2014-01-30 MED ORDER — FUROSEMIDE 40 MG PO TABS: 40.0000 mg | ORAL_TABLET | Freq: Every day | ORAL | Status: DC

## 2014-01-30 MED ORDER — AMLODIPINE BESYLATE 5 MG PO TABS: 5.0000 mg | ORAL_TABLET | Freq: Every day | ORAL | Status: DC

## 2014-01-30 MED ORDER — BENAZEPRIL HCL 40 MG PO TABS: 40.0000 mg | ORAL_TABLET | Freq: Every day | ORAL | Status: DC

## 2014-01-30 MED ORDER — CARVEDILOL 25 MG PO TABS: 25.0000 mg | ORAL_TABLET | Freq: Two times a day (BID) | ORAL | Status: DC

## 2014-01-30 NOTE — Assessment & Plan Note (Signed)
Mild elevated, for f/u bp at home and next visit, already on 4 max BP meds, consider change to hydralazine, declines today, cont same tx

## 2014-01-30 NOTE — Patient Instructions (Addendum)
You had the flu shot today  Please continue to monitor your Blood Pressure as you do, and return for persistent < 140/90  Please continue all other medications as before, and refills have been done if requested.  Please have the pharmacy call with any other refills you may need.  Please continue your efforts at being more active, low cholesterol diet, and weight control.  You are otherwise up to date with prevention measures today.  Please keep your appointments with your specialists as you may have planned  Please go to the LAB in the Basement (turn left off the elevator) for the tests to be done today  You will be contacted by phone if any changes need to be made immediately.  Otherwise, you will receive a letter about your results with an explanation, but please check with MyChart first.  Please remember to sign up for MyChart if you have not done so, as this will be important to you in the future with finding out test results, communicating by private email, and scheduling acute appointments online when needed.  Please return in 6 months, or sooner if needed

## 2014-01-30 NOTE — Progress Notes (Signed)
Pre visit review using our clinic review tool, if applicable. No additional management support is needed unless otherwise documented below in the visit note. 

## 2014-01-30 NOTE — Assessment & Plan Note (Signed)
Overall stable, pain control, walking with walker for stability, declines need for PT for now

## 2014-01-30 NOTE — Progress Notes (Signed)
Subjective:    Patient ID: Heather [name], female    DOB: 1928/10/16, 79 y.o.   MRN: [id number]  HPI  Here for wellness and f/u with daughter,  Overall doing ok;  Pt denies CP, worsening SOB, DOE, wheezing, orthopnea, PND, worsening LE edema, palpitations, dizziness or syncope.  Pt denies neurological change such as new headache, facial or extremity weakness.  Pt denies polydipsia, polyuria, or low sugar symptoms. Pt states overall good compliance with treatment and medications, good tolerability, and has been trying to follow lower cholesterol diet.  Pt denies worsening depressive symptoms, suicidal ideation or panic. No fever, night sweats, wt loss, loss of appetite, or other constitutional symptoms.  Pt states good ability with ADL's, has low fall risk, home safety reviewed and adequate, no other significant changes in hearing or vision, and only occasionally active with exercise.,  Walking with walker for the past 2 wks for safetry and stability, walks room to room at home, does ADL's ok, no recent falls. States BP at home usually < 145/90.  No other complaints Past Medical History  Diagnosis Date  . GLUCOSE INTOLERANCE 01/24/2009  . HYPERCHOLESTEROLEMIA 12/09/2006  . HYPERLIPIDEMIA 12/18/2006  . GOUT 12/09/2006  . HYPERTENSION 12/09/2006  . ALLERGIC RHINITIS 12/09/2006  . GERD 12/09/2006  . MENOPAUSAL DISORDER 01/25/2008  . DEGENERATIVE JOINT DISEASE, LEFT KNEE 01/23/2007  . Encephalocele 01/23/2007  . FEVER UNSPECIFIED 01/28/2010  . Impaired glucose tolerance 07/26/2010   Past Surgical History  Procedure Laterality Date  . S/p anterior encephalocele repair and craniotomy  2003    reports that she has never smoked. She does not have any smokeless tobacco history on file. She reports that she does not drink alcohol or use illicit drugs. family history includes Hypertension in her other. Allergies  Allergen Reactions  . Codeine   . Lipitor [Atorvastatin] Other (See Comments)    myalgia  .  Lovastatin   . Pravastatin Sodium    Current Outpatient Prescriptions on File Prior to Visit  Medication Sig Dispense Refill  . aspirin 325 MG EC tablet Take 325 mg by mouth daily.         No current facility-administered medications on file prior to visit.   Review of Systems Constitutional: Negative for increased diaphoresis, other activity, appetite or other siginficant weight change  HENT: Negative for worsening hearing loss, ear pain, facial swelling, mouth sores and neck stiffness.   Eyes: Negative for other worsening pain, redness or visual disturbance.  Respiratory: Negative for shortness of breath and wheezing.   Cardiovascular: Negative for chest pain and palpitations.  Gastrointestinal: Negative for diarrhea, blood in stool, abdominal distention or other pain Genitourinary: Negative for hematuria, flank pain or change in urine volume.  Musculoskeletal: Negative for myalgias or other joint complaints.  Skin: Negative for color change and wound.  Neurological: Negative for syncope and numbness. other than noted Hematological: Negative for adenopathy. or other swelling Psychiatric/Behavioral: Negative for hallucinations, self-injury, decreased concentration or other worsening agitation.      Objective:   Physical Exam BP 152/100  Pulse 106  Temp(Src) 98.4 F (36.9 C) (Oral)  Wt 249 lb (112.946 kg)  SpO2 96% VS noted, more frail appearing and mild general weak, walks with walker Constitutional: Pt is oriented to person, place, and time. Appears well-developed and well-nourished. [name] Head: Normocephalic and atraumatic.  Right Ear: External ear normal.  Left Ear: External ear normal.  Nose: Nose normal.  Mouth/Throat: Oropharynx is clear and moist.  Eyes: Conjunctivae and  EOM are normal. Pupils are equal, round, and reactive to light.  Neck: Normal range of motion. Neck supple. No JVD present. No tracheal deviation present.  Cardiovascular: Normal rate, regular  rhythm, normal heart sounds and intact distal pulses.   Pulmonary/Chest: Effort normal and breath sounds without rales or wheezing  Abdominal: Soft. Bowel sounds are normal. NT. No HSM  Musculoskeletal: Normal range of motion. Exhibits no edema.  Lymphadenopathy:  Has no cervical adenopathy.  Neurological: Pt is alert and oriented to person, place, and time. Pt has normal reflexes. No cranial nerve deficit. Motor grossly intact Skin: Skin is warm and dry. No rash noted. tace chronic LE edema Psychiatric:  Has normal mood and affect. Behavior is normal.     Assessment & Plan:

## 2014-01-30 NOTE — Assessment & Plan Note (Signed)

## 2014-01-30 NOTE — Assessment & Plan Note (Signed)
Asympt, for f/u a1c 

## 2014-01-31 ENCOUNTER — Telehealth: Payer: Self-pay | Admitting: Internal Medicine

## 2014-01-31 NOTE — Telephone Encounter (Signed)
emmi mailed  °

## 2014-08-01 ENCOUNTER — Encounter: Payer: Self-pay | Admitting: Internal Medicine

## 2014-08-01 ENCOUNTER — Ambulatory Visit (INDEPENDENT_AMBULATORY_CARE_PROVIDER_SITE_OTHER): Payer: Medicare Other | Admitting: Internal Medicine

## 2014-08-01 VITALS — BP 128/82 | HR 75 | Temp 98.7°F | Resp 20 | Ht 62.0 in | Wt 254.0 lb

## 2014-08-01 DIAGNOSIS — R7302 Impaired glucose tolerance (oral): Secondary | ICD-10-CM | POA: Diagnosis not present

## 2014-08-01 DIAGNOSIS — E785 Hyperlipidemia, unspecified: Secondary | ICD-10-CM | POA: Diagnosis not present

## 2014-08-01 DIAGNOSIS — I872 Venous insufficiency (chronic) (peripheral): Secondary | ICD-10-CM | POA: Diagnosis not present

## 2014-08-01 DIAGNOSIS — I10 Essential (primary) hypertension: Secondary | ICD-10-CM | POA: Diagnosis not present

## 2014-08-01 MED ORDER — CARVEDILOL 25 MG PO TABS: 25.0000 mg | ORAL_TABLET | Freq: Two times a day (BID) | ORAL | Status: DC

## 2014-08-01 MED ORDER — BENAZEPRIL HCL 40 MG PO TABS: 40.0000 mg | ORAL_TABLET | Freq: Every day | ORAL | Status: DC

## 2014-08-01 MED ORDER — FUROSEMIDE 40 MG PO TABS: 40.0000 mg | ORAL_TABLET | Freq: Every day | ORAL | Status: DC

## 2014-08-01 MED ORDER — AMLODIPINE BESYLATE 5 MG PO TABS: 5.0000 mg | ORAL_TABLET | Freq: Every day | ORAL | Status: DC

## 2014-08-01 NOTE — Assessment & Plan Note (Signed)
stable overall by history and exam, recent data reviewed with pt, and pt to continue medical treatment as before,  to f/u any worsening symptoms or concerns Lab Results  Component Value Date   HGBA1C 6.1 01/30/2014

## 2014-08-01 NOTE — Progress Notes (Signed)
   Subjective:    Patient ID: Heather [name], female    DOB: 22-Apr-1928, 81 y.o.   MRN: [id number]  HPI  Here to f/u; overall doing ok,  Pt denies chest pain, increasing sob or doe, wheezing, orthopnea, PND, increased LE swelling, palpitations, dizziness or syncope.  Pt denies new neurological symptoms such as new headache, or facial or extremity weakness or numbness.  Pt denies polydipsia, polyuria, or low sugar episode.   Pt denies new neurological symptoms such as new headache, or facial or extremity weakness or numbness.   Pt states overall good compliance with meds, mostly trying to follow appropriate diet, with wt overall stable,  but little exercise however.  No new complaints except feet swell at end of day, less in the am again. Past Medical History  Diagnosis Date  . GLUCOSE INTOLERANCE 01/24/2009  . HYPERCHOLESTEROLEMIA 12/09/2006  . HYPERLIPIDEMIA 12/18/2006  . GOUT 12/09/2006  . HYPERTENSION 12/09/2006  . ALLERGIC RHINITIS 12/09/2006  . GERD 12/09/2006  . MENOPAUSAL DISORDER 01/25/2008  . DEGENERATIVE JOINT DISEASE, LEFT KNEE 01/23/2007  . Encephalocele 01/23/2007  . FEVER UNSPECIFIED 01/28/2010  . Impaired glucose tolerance 07/26/2010   Past Surgical History  Procedure Laterality Date  . S/p anterior encephalocele repair and craniotomy  2003    reports that she has never smoked. She does not have any smokeless tobacco history on file. She reports that she does not drink alcohol or use illicit drugs. family history includes Hypertension in her other. Allergies  Allergen Reactions  . Codeine   . Lipitor [Atorvastatin] Other (See Comments)    myalgia  . Lovastatin   . Pravastatin Sodium    Current Outpatient Prescriptions on File Prior to Visit  Medication Sig Dispense Refill  . aspirin 325 MG EC tablet Take 325 mg by mouth daily.       No current facility-administered medications on file prior to visit.   Review of Systems  Constitutional: Negative for unusual diaphoresis  or night sweats HENT: Negative for ringing in ear or discharge Eyes: Negative for double vision or worsening visual disturbance.  Respiratory: Negative for choking and stridor.   Gastrointestinal: Negative for vomiting or other signifcant bowel change Genitourinary: Negative for hematuria or change in urine volume.  Musculoskeletal: Negative for other MSK pain or swelling Skin: Negative for color change and worsening wound.  Neurological: Negative for tremors and numbness other than noted  Psychiatric/Behavioral: Negative for decreased concentration or agitation other than above       Objective:   Physical Exam BP 128/82 mmHg  Pulse 75  Temp(Src) 98.7 F (37.1 C) (Oral)  Resp 20  Ht 5\' 2"  (1.575 m)  Wt 254 lb 0.6 oz (115.232 kg)  BMI 46.45 kg/m2  SpO2 98% VS noted,  Constitutional: Pt appears in no significant distress HENT: Head: NCAT.  Right Ear: External ear normal.  Left Ear: External ear normal.  Eyes: . Pupils are equal, round, and reactive to light. Conjunctivae and EOM are normal Neck: Normal range of motion. Neck supple.  Cardiovascular: Normal rate and regular rhythm.   Pulmonary/Chest: Effort normal and breath sounds without rales or wheezing.  Abd:  Soft, NT, ND, + BS Neurological: Pt is alert. Not confused , motor grossly intact Skin: Skin is warm. No rash, trace to 1+ edema to mid legs LE edema Psychiatric: Pt behavior is normal. No agitation.      Assessment & Plan:

## 2014-08-01 NOTE — Assessment & Plan Note (Signed)
Uncontroled, likely genetic component, statin intolerant, declines further tx, mild worsening recenlty - for lower chol diet

## 2014-08-01 NOTE — Assessment & Plan Note (Signed)
stable overall by history and exam, recent data reviewed with pt, and pt to continue medical treatment as before,  to f/u any worsening symptoms or concerns BP Readings from Last 3 Encounters:  08/01/14 128/82  01/30/14 152/100  08/01/13 164/104

## 2014-08-01 NOTE — Assessment & Plan Note (Signed)
D/w pt, for leg elevation, low salt diet, wt loss, but declines compression stockings for now

## 2014-08-01 NOTE — Progress Notes (Signed)
Pre visit review using our clinic review tool, if applicable. No additional management support is needed unless otherwise documented below in the visit note. 

## 2014-08-01 NOTE — Patient Instructions (Signed)
Please continue all other medications as before, and refills have been done if requested.  Please have the pharmacy call with any other refills you may need.  Please continue your efforts at being more active, low cholesterol diet, and weight control.  You are otherwise up to date with prevention measures today.  Please keep your appointments with your specialists as you may have planned  Please return in 6 months, or sooner if needed 

## 2015-01-31 ENCOUNTER — Ambulatory Visit: Payer: Medicare Other | Admitting: Internal Medicine

## 2015-03-19 ENCOUNTER — Ambulatory Visit (INDEPENDENT_AMBULATORY_CARE_PROVIDER_SITE_OTHER): Payer: Medicare Other | Admitting: Internal Medicine

## 2015-03-19 ENCOUNTER — Encounter: Payer: Self-pay | Admitting: Internal Medicine

## 2015-03-19 ENCOUNTER — Other Ambulatory Visit (INDEPENDENT_AMBULATORY_CARE_PROVIDER_SITE_OTHER): Payer: Medicare Other

## 2015-03-19 VITALS — BP 160/100 | HR 67 | Temp 98.4°F

## 2015-03-19 DIAGNOSIS — E785 Hyperlipidemia, unspecified: Secondary | ICD-10-CM | POA: Diagnosis not present

## 2015-03-19 DIAGNOSIS — Z0000 Encounter for general adult medical examination without abnormal findings: Secondary | ICD-10-CM | POA: Diagnosis not present

## 2015-03-19 DIAGNOSIS — R7302 Impaired glucose tolerance (oral): Secondary | ICD-10-CM

## 2015-03-19 DIAGNOSIS — I10 Essential (primary) hypertension: Secondary | ICD-10-CM

## 2015-03-19 DIAGNOSIS — Z23 Encounter for immunization: Secondary | ICD-10-CM | POA: Diagnosis not present

## 2015-03-19 LAB — HEPATIC FUNCTION PANEL
ALT: 13 U/L (ref 0–35)
AST: 19 U/L (ref 0–37)
Albumin: 3.4 g/dL — ABNORMAL LOW (ref 3.5–5.2)
Alkaline Phosphatase: 71 U/L (ref 39–117)
Bilirubin, Direct: 0.1 mg/dL (ref 0.0–0.3)
Total Bilirubin: 0.6 mg/dL (ref 0.2–1.2)
Total Protein: 7 g/dL (ref 6.0–8.3)

## 2015-03-19 LAB — URINALYSIS, ROUTINE W REFLEX MICROSCOPIC
Bilirubin Urine: NEGATIVE
Hgb urine dipstick: NEGATIVE
Ketones, ur: NEGATIVE
Nitrite: NEGATIVE
RBC / HPF: NONE SEEN (ref 0–?)
Specific Gravity, Urine: 1.02 (ref 1.000–1.030)
Total Protein, Urine: NEGATIVE
Urine Glucose: NEGATIVE
Urobilinogen, UA: 0.2 (ref 0.0–1.0)
pH: 6.5 (ref 5.0–8.0)

## 2015-03-19 LAB — BASIC METABOLIC PANEL
BUN: 17 mg/dL (ref 6–23)
CO2: 31 mEq/L (ref 19–32)
Calcium: 9.1 mg/dL (ref 8.4–10.5)
Chloride: 101 mEq/L (ref 96–112)
Creatinine, Ser: 0.77 mg/dL (ref 0.40–1.20)
GFR: 91.73 mL/min (ref >60.00)
Glucose, Bld: 100 mg/dL — ABNORMAL HIGH (ref 70–99)
Potassium: 3.5 mEq/L (ref 3.5–5.1)
Sodium: 140 mEq/L (ref 135–145)

## 2015-03-19 LAB — LIPID PANEL
Cholesterol: 226 mg/dL — ABNORMAL HIGH (ref 0–200)
HDL: 44.6 mg/dL (ref >39.00)
LDL Cholesterol: 153 mg/dL — ABNORMAL HIGH (ref 0–99)
NonHDL: 180.98
Total CHOL/HDL Ratio: 5
Triglycerides: 139 mg/dL (ref 0.0–149.0)
VLDL: 27.8 mg/dL (ref 0.0–40.0)

## 2015-03-19 LAB — CBC WITH DIFFERENTIAL/PLATELET
Basophils Absolute: 0.1 10*3/uL (ref 0.0–0.1)
Basophils Relative: 0.9 % (ref 0.0–3.0)
Eosinophils Absolute: 0.3 10*3/uL (ref 0.0–0.7)
Eosinophils Relative: 4.7 % (ref 0.0–5.0)
HCT: 38.8 % (ref 36.0–46.0)
Hemoglobin: 12.4 g/dL (ref 12.0–15.0)
Lymphocytes Relative: 28.8 % (ref 12.0–46.0)
Lymphs Abs: 1.9 10*3/uL (ref 0.7–4.0)
MCHC: 32.1 g/dL (ref 30.0–36.0)
MCV: 86.8 fl (ref 78.0–100.0)
Monocytes Absolute: 0.8 10*3/uL (ref 0.1–1.0)
Monocytes Relative: 12.1 % — ABNORMAL HIGH (ref 3.0–12.0)
Neutro Abs: 3.5 10*3/uL (ref 1.4–7.7)
Neutrophils Relative %: 53.5 % (ref 43.0–77.0)
Platelets: 275 10*3/uL (ref 150.0–400.0)
RBC: 4.47 Mil/uL (ref 3.87–5.11)
RDW: 14.9 % (ref 11.5–15.5)
WBC: 6.5 10*3/uL (ref 4.0–10.5)

## 2015-03-19 LAB — HEMOGLOBIN A1C: Hgb A1c MFr Bld: 6.2 % (ref 4.6–6.5)

## 2015-03-19 LAB — TSH: TSH: 2.59 u[IU]/mL (ref 0.35–4.50)

## 2015-03-19 NOTE — Assessment & Plan Note (Addendum)
elev today but stable overall by history and exam, recent data reviewed with pt, and pt to continue medical treatment as before,  to f/u any worsening symptoms or concerns BP Readings from Last 3 Encounters:  03/19/15 160/100  08/01/14 128/82  01/30/14 152/100   For med restart, cont to monitor at home and next visit

## 2015-03-19 NOTE — Progress Notes (Signed)
Pre visit review using our clinic review tool, if applicable. No additional management support is needed unless otherwise documented below in the visit note. 

## 2015-03-19 NOTE — Assessment & Plan Note (Signed)
stable overall by history and exam, recent data reviewed with pt, and pt to continue medical treatment as before,  to f/u any worsening symptoms or concerns Lab Results  Component Value Date   HGBA1C 6.1 01/30/2014    

## 2015-03-19 NOTE — Assessment & Plan Note (Signed)
Has been statin intolerant in past, for cont'd low chol diet, pt declines even low dose crestor 5 mg

## 2015-03-19 NOTE — Patient Instructions (Addendum)

## 2015-03-19 NOTE — Assessment & Plan Note (Signed)

## 2015-03-19 NOTE — Progress Notes (Signed)
Subjective:    Patient ID: Heather [name], female    DOB: 01/10/1929, 81 y.o.   MRN: [id number]  HPI   Here for wellness and f/u;  Overall doing ok;  Pt denies Chest pain, worsening SOB, DOE, wheezing, orthopnea, PND, worsening LE edema, palpitations, dizziness or syncope.  Pt denies neurological change such as new headache, facial or extremity weakness.  Pt denies polydipsia, polyuria, or low sugar symptoms. Pt states overall good compliance with treatment and medications, good tolerability, and has been trying to follow appropriate diet.  Pt denies worsening depressive symptoms, suicidal ideation or panic. No fever, night sweats, wt loss, loss of appetite, or other constitutional symptoms.  Pt states good ability with ADL's, has low fall risk, home safety reviewed and adequate, no other significant changes in hearing or vision, and only occasionally active with exercise.  In wheelchair today in exam room, but can walk short distances with walker. No recent falls.  Ran out of BP meds in the last wk, BP usually < 140/90 at home.. Past Medical History  Diagnosis Date  . GLUCOSE INTOLERANCE 01/24/2009  . HYPERCHOLESTEROLEMIA 12/09/2006  . HYPERLIPIDEMIA 12/18/2006  . GOUT 12/09/2006  . HYPERTENSION 12/09/2006  . ALLERGIC RHINITIS 12/09/2006  . GERD 12/09/2006  . MENOPAUSAL DISORDER 01/25/2008  . DEGENERATIVE JOINT DISEASE, LEFT KNEE 01/23/2007  . Encephalocele ([place]) 01/23/2007  . FEVER UNSPECIFIED 01/28/2010  . Impaired glucose tolerance 07/26/2010   Past Surgical History  Procedure Laterality Date  . S/p anterior encephalocele repair and craniotomy  2003    reports that she has never smoked. She does not have any smokeless tobacco history on file. She reports that she does not drink alcohol or use illicit drugs. family history includes Hypertension in her other. Allergies  Allergen Reactions  . Codeine   . Lipitor [Atorvastatin] Other (See Comments)    myalgia  . Lovastatin   . Pravastatin  Sodium    Current Outpatient Prescriptions on File Prior to Visit  Medication Sig Dispense Refill  . amLODipine (NORVASC) 5 MG tablet Take 1 tablet (5 mg total) by mouth daily. 90 tablet 3  . aspirin 325 MG EC tablet Take 325 mg by mouth daily.      . benazepril (LOTENSIN) 40 MG tablet Take 1 tablet (40 mg total) by mouth daily. 90 tablet 3  . carvedilol (COREG) 25 MG tablet Take 1 tablet (25 mg total) by mouth 2 (two) times daily. 180 tablet 3  . furosemide (LASIX) 40 MG tablet Take 1 tablet (40 mg total) by mouth daily. 90 tablet 3   No current facility-administered medications on file prior to visit.    Review of Systems Constitutional: Negative for increased diaphoresis, other activity, appetite or siginficant weight change other than noted HENT: Negative for worsening hearing loss, ear pain, facial swelling, mouth sores and neck stiffness.   Eyes: Negative for other worsening pain, redness or visual disturbance.  Respiratory: Negative for shortness of breath and wheezing  Cardiovascular: Negative for chest pain and palpitations.  Gastrointestinal: Negative for diarrhea, blood in stool, abdominal distention or other pain Genitourinary: Negative for hematuria, flank pain or change in urine volume.  Musculoskeletal: Negative for myalgias or other joint complaints.  Skin: Negative for color change and wound or drainage.  Neurological: Negative for syncope and numbness. other than noted Hematological: Negative for adenopathy. or other swelling Psychiatric/Behavioral: Negative for hallucinations, SI, self-injury, decreased concentration or other worsening agitation.      Objective:   Physical Exam  BP 160/100 mmHg  Pulse 67  Temp(Src) 98.4 F (36.9 C) (Oral)  Wt   SpO2 95% VS noted, morbid obese Constitutional: Pt is oriented to person, place, and time. Appears well-developed and well-nourished, in no significant distress Head: Normocephalic and atraumatic.  Right Ear: External  ear normal.  Left Ear: External ear normal.  Nose: Nose normal.  Mouth/Throat: Oropharynx is clear and moist.  Eyes: Conjunctivae and EOM are normal. Pupils are equal, round, and reactive to light.  Neck: Normal range of motion. Neck supple. No JVD present. No tracheal deviation present or significant neck LA or mass Cardiovascular: Normal rate, regular rhythm, normal heart sounds and intact distal pulses.   Pulmonary/Chest: Effort normal and breath sounds without rales or wheezing  Abdominal: Soft. Bowel sounds are normal. NT. No HSM  Musculoskeletal: Normal range of motion. Exhibits trace bilat ankle edema.  Lymphadenopathy:  Has no cervical adenopathy.  Neurological: Pt is alert and oriented to person, place, and time. Pt has normal reflexes. No cranial nerve deficit. Motor grossly intact Skin: Skin is warm and dry. No rash noted.  Psychiatric:  Has normal mood and affect. Behavior is normal.     Assessment & Plan:

## 2015-03-20 ENCOUNTER — Encounter: Payer: Self-pay | Admitting: Internal Medicine

## 2015-09-17 ENCOUNTER — Ambulatory Visit: Payer: Medicare Other | Admitting: Internal Medicine

## 2015-10-24 ENCOUNTER — Ambulatory Visit: Payer: Medicare Other | Admitting: Internal Medicine

## 2015-11-05 NOTE — Progress Notes (Signed)
Opened in error

## 2015-12-28 ENCOUNTER — Telehealth: Payer: Self-pay | Admitting: Family Medicine

## 2015-12-28 ENCOUNTER — Other Ambulatory Visit: Payer: Self-pay | Admitting: Internal Medicine

## 2015-12-28 ENCOUNTER — Telehealth: Payer: Self-pay | Admitting: Internal Medicine

## 2015-12-28 MED ORDER — FUROSEMIDE 40 MG PO TABS: 40.0000 mg | ORAL_TABLET | Freq: Every day | ORAL | 0 refills | Status: DC

## 2015-12-28 MED ORDER — BENAZEPRIL HCL 40 MG PO TABS: 40.0000 mg | ORAL_TABLET | Freq: Every day | ORAL | 0 refills | Status: DC

## 2015-12-28 MED ORDER — AMLODIPINE BESYLATE 5 MG PO TABS
5.0000 mg | ORAL_TABLET | Freq: Every day | ORAL | 0 refills | Status: DC
Start: 2015-12-28 — End: 2016-01-01

## 2015-12-28 MED ORDER — CARVEDILOL 25 MG PO TABS: 25.0000 mg | ORAL_TABLET | Freq: Two times a day (BID) | ORAL | 0 refills | Status: DC

## 2015-12-28 NOTE — Telephone Encounter (Signed)
Received call from team health. Patient's daughter called the on-call line and notes that her bilateral lower extremities have been swollen. She reports that her mother has been out of her medications for 3 weeks without her knowing. The patient has been cared for by another daughter and somebody came to clean the house and threw out all her medicines. Notes she has been off of them for one week. She's not had any chest pain or shortness of breath. The team health nurse does note that they complained of left-sided grip strength being decreased and some numbness in her left hand. She reports that the patient's daughter states this is due to a growth on her left hand. I advised that this is concerning for some other cause and an 82 year old patient with her comorbidities and advised they be evaluated today for this at an emergency department or urgent care. The team health RN will advise them of this. Medications have been reviewed in the chart. I will send in a one-week supply of medication for the patient. The team health RN did schedule the patient an appointment on Wednesday of next week. Advised that she will need to keep this appointment. FYI to patient's PCP.

## 2015-12-28 NOTE — Telephone Encounter (Signed)
[place]  Patient Name: Heather [name]  DOB: 04-12-1929    Initial Comment Caller says her mother rearranged her house and somebody helping to clean tossed her pills. Has not had water pill in 3 weeks, and her feet are swollen. Also needs BP med. pharmacy says refills expired.    Nurse Assessment  Nurse: Wynetta Emery, RN, Baker Janus Date/Time Eilene Ghazi Time): 12/28/2015 4:50:43 PM  Confirm and document reason for call. If symptomatic, describe symptoms. You must click the next button to save text entered. ---Stanton Kidney has legs and feet are swollen having short term memory is going; missed appt in April had someone rearrange house and pills were in a bag and they were put in a bag and thrown away Having trouble with grip and numbness in left arm d/t growth on finger; 3 weeks without out medications;  Has the patient traveled out of the country within the last 30 days? ---No  Does the patient have any new or worsening symptoms? ---Yes  Will a triage be completed? ---Yes  Related visit to physician within the last 2 weeks? ---No  Does the PT have any chronic conditions? (i.e. diabetes, asthma, etc.) ---Unknown  Is this a behavioral health or substance abuse call? ---No     Guidelines    Guideline Title Affirmed Question Affirmed Notes  Leg Swelling and Edema [1] MODERATE leg swelling (e.g., swelling extends up to knees) AND [2] new onset or worsening    Final Disposition User   See Physician within 24 Hours [place], RN, Baker Janus    Comments  NOTE: Ennis Forts stopped by to see mother and found out she missed appt in April and has been out of the following medications x 3 weeks; Carvedilol 25mg  one po bid; Furosemide 40mg  one po daily; benazepril 40mg  one po QD; and Amlodipine 5mg  one po QD Can they get these medications called in to [facility] [phone]  NOTE: due the severity of the call; Nurse made an appointment on Saturday for this  patient; [facility]: 815am with [name] MD   Referrals  REFERRED TO PCP OFFICE   Disagree/Comply: Leta Baptist

## 2016-01-01 ENCOUNTER — Encounter: Payer: Self-pay | Admitting: Internal Medicine

## 2016-01-01 ENCOUNTER — Other Ambulatory Visit (INDEPENDENT_AMBULATORY_CARE_PROVIDER_SITE_OTHER): Payer: Medicare Other

## 2016-01-01 ENCOUNTER — Ambulatory Visit (INDEPENDENT_AMBULATORY_CARE_PROVIDER_SITE_OTHER): Payer: Medicare Other | Admitting: Internal Medicine

## 2016-01-01 VITALS — BP 142/82 | HR 76 | Temp 98.6°F | Resp 20 | Wt 256.0 lb

## 2016-01-01 DIAGNOSIS — R6889 Other general symptoms and signs: Secondary | ICD-10-CM | POA: Diagnosis not present

## 2016-01-01 DIAGNOSIS — Z91148 Patient's other noncompliance with medication regimen for other reason: Secondary | ICD-10-CM | POA: Insufficient documentation

## 2016-01-01 DIAGNOSIS — R531 Weakness: Secondary | ICD-10-CM | POA: Diagnosis not present

## 2016-01-01 DIAGNOSIS — Z0001 Encounter for general adult medical examination with abnormal findings: Secondary | ICD-10-CM

## 2016-01-01 DIAGNOSIS — I10 Essential (primary) hypertension: Secondary | ICD-10-CM

## 2016-01-01 DIAGNOSIS — Z23 Encounter for immunization: Secondary | ICD-10-CM | POA: Diagnosis not present

## 2016-01-01 DIAGNOSIS — R413 Other amnesia: Secondary | ICD-10-CM | POA: Diagnosis not present

## 2016-01-01 DIAGNOSIS — F0391 Unspecified dementia with behavioral disturbance: Secondary | ICD-10-CM | POA: Insufficient documentation

## 2016-01-01 DIAGNOSIS — R609 Edema, unspecified: Secondary | ICD-10-CM

## 2016-01-01 DIAGNOSIS — Z9114 Patient's other noncompliance with medication regimen: Secondary | ICD-10-CM | POA: Insufficient documentation

## 2016-01-01 DIAGNOSIS — R600 Localized edema: Secondary | ICD-10-CM | POA: Insufficient documentation

## 2016-01-01 DIAGNOSIS — R7302 Impaired glucose tolerance (oral): Secondary | ICD-10-CM

## 2016-01-01 DIAGNOSIS — F03918 Unspecified dementia, unspecified severity, with other behavioral disturbance: Secondary | ICD-10-CM | POA: Insufficient documentation

## 2016-01-01 LAB — URINALYSIS, ROUTINE W REFLEX MICROSCOPIC
Bilirubin Urine: NEGATIVE
Hgb urine dipstick: NEGATIVE
Ketones, ur: NEGATIVE
Nitrite: NEGATIVE
Specific Gravity, Urine: 1.02 (ref 1.000–1.030)
Urine Glucose: NEGATIVE
Urobilinogen, UA: 0.2 (ref 0.0–1.0)
pH: 6 (ref 5.0–8.0)

## 2016-01-01 LAB — HEPATIC FUNCTION PANEL
ALT: 13 U/L (ref 0–35)
AST: 17 U/L (ref 0–37)
Albumin: 3.2 g/dL — ABNORMAL LOW (ref 3.5–5.2)
Alkaline Phosphatase: 61 U/L (ref 39–117)
Bilirubin, Direct: 0 mg/dL (ref 0.0–0.3)
Total Bilirubin: 0.5 mg/dL (ref 0.2–1.2)
Total Protein: 6.6 g/dL (ref 6.0–8.3)

## 2016-01-01 LAB — BASIC METABOLIC PANEL
BUN: 18 mg/dL (ref 6–23)
CO2: 29 mEq/L (ref 19–32)
Calcium: 8.8 mg/dL (ref 8.4–10.5)
Chloride: 103 mEq/L (ref 96–112)
Creatinine, Ser: 0.9 mg/dL (ref 0.40–1.20)
GFR: 76.47 mL/min (ref >60.00)
Glucose, Bld: 125 mg/dL — ABNORMAL HIGH (ref 70–99)
Potassium: 3.4 mEq/L — ABNORMAL LOW (ref 3.5–5.1)
Sodium: 139 mEq/L (ref 135–145)

## 2016-01-01 LAB — CBC WITH DIFFERENTIAL/PLATELET
Basophils Absolute: 0 10*3/uL (ref 0.0–0.1)
Basophils Relative: 0.7 % (ref 0.0–3.0)
Eosinophils Absolute: 0.2 10*3/uL (ref 0.0–0.7)
Eosinophils Relative: 2.7 % (ref 0.0–5.0)
HCT: 38.7 % (ref 36.0–46.0)
Hemoglobin: 12.8 g/dL (ref 12.0–15.0)
Lymphocytes Relative: 24.4 % (ref 12.0–46.0)
Lymphs Abs: 1.5 10*3/uL (ref 0.7–4.0)
MCHC: 33.2 g/dL (ref 30.0–36.0)
MCV: 85.6 fl (ref 78.0–100.0)
Monocytes Absolute: 0.7 10*3/uL (ref 0.1–1.0)
Monocytes Relative: 11.4 % (ref 3.0–12.0)
Neutro Abs: 3.8 10*3/uL (ref 1.4–7.7)
Neutrophils Relative %: 60.8 % (ref 43.0–77.0)
Platelets: 265 10*3/uL (ref 150.0–400.0)
RBC: 4.52 Mil/uL (ref 3.87–5.11)
RDW: 15.1 % (ref 11.5–15.5)
WBC: 6.3 10*3/uL (ref 4.0–10.5)

## 2016-01-01 LAB — LIPID PANEL
Cholesterol: 202 mg/dL — ABNORMAL HIGH (ref 0–200)
HDL: 44.8 mg/dL (ref >39.00)
NonHDL: 157.12
Total CHOL/HDL Ratio: 5
Triglycerides: 208 mg/dL — ABNORMAL HIGH (ref 0.0–149.0)
VLDL: 41.6 mg/dL — ABNORMAL HIGH (ref 0.0–40.0)

## 2016-01-01 LAB — VITAMIN D 25 HYDROXY (VIT D DEFICIENCY, FRACTURES): VITD: 36.97 ng/mL (ref 30.00–100.00)

## 2016-01-01 LAB — HEMOGLOBIN A1C: Hgb A1c MFr Bld: 6.2 % (ref 4.6–6.5)

## 2016-01-01 LAB — LDL CHOLESTEROL, DIRECT: Direct LDL: 139 mg/dL

## 2016-01-01 LAB — VITAMIN B12: Vitamin B-12: 636 pg/mL (ref 211–911)

## 2016-01-01 LAB — TSH: TSH: 3.59 u[IU]/mL (ref 0.35–4.50)

## 2016-01-01 MED ORDER — FUROSEMIDE 40 MG PO TABS: 40.0000 mg | ORAL_TABLET | Freq: Every day | ORAL | 3 refills | Status: DC

## 2016-01-01 MED ORDER — BENAZEPRIL HCL 40 MG PO TABS: 40.0000 mg | ORAL_TABLET | Freq: Every day | ORAL | 3 refills | Status: DC

## 2016-01-01 MED ORDER — AMLODIPINE BESYLATE 5 MG PO TABS: 5.0000 mg | ORAL_TABLET | Freq: Every day | ORAL | 3 refills | Status: DC

## 2016-01-01 MED ORDER — CARVEDILOL 25 MG PO TABS: 25.0000 mg | ORAL_TABLET | Freq: Two times a day (BID) | ORAL | 3 refills | Status: DC

## 2016-01-01 NOTE — Patient Instructions (Addendum)
You had the flu shot today  Please continue all other medications as before, and refills have been done if requested - all of your current medications  Please have the pharmacy call with any other refills you may need.  Please continue your efforts at being more active, low cholesterol diet, and weight control.  You are otherwise up to date with prevention measures today.  Please keep your appointments with your specialists as you may have planned  You will be contacted regarding the referral for: MRI head, Neurology, Echocardiogram, as well as Home Health with home Physical Therapy  Please go to the LAB in the Basement (turn left off the elevator) for the tests to be done today  You will be contacted by phone if any changes need to be made immediately.  Otherwise, you will receive a letter about your results with an explanation, but please check with MyChart first.  Please remember to sign up for MyChart if you have not done so, as this will be important to you in the future with finding out test results, communicating by private email, and scheduling acute appointments online when needed.  Please return in 1 months, or sooner if needed

## 2016-01-01 NOTE — Progress Notes (Signed)
Subjective:    Patient ID: Heather [name], female    DOB: 1929-01-21, 82 y.o.   MRN: [id number]  HPI  Here for wellness and f/u;  Overall doing ok;  Pt denies Chest pain, worsening SOB, DOE, wheezing, orthopnea, PND, worsening LE edema, palpitations, dizziness or syncope.  Pt denies neurological change such as new headache, facial or extremity weakness.  Pt denies polydipsia, polyuria, or low sugar symptoms. Pt states overall good compliance with treatment and medications, good tolerability, and has been trying to follow appropriate diet.  Pt denies worsening depressive symptoms, suicidal ideation or panic, but has been more anxious per family with her.. No fever, night sweats, wt loss, loss of appetite, or other constitutional symptoms.  Pt states good ability with ADL's, has low fall risk, home safety reviewed and adequate, no other significant changes in hearing or vision, now sedentary. Uses cane and walker, with only fall with left leg weakness one time, several months ago, none since, has BSC at home.Marland Kitchen   Has been statin intolerant in the past.   Wt Readings from Last 3 Encounters:  01/01/16 256 lb (116.1 kg)  08/01/14 254 lb 0.6 oz (115.2 kg)  01/30/14 249 lb (112.9 kg)   Out of meds for 3 wks.  Has some ST memory loss worsening in the past few months per daughter.  Primarily noted with mult conversations about the same topic several times per day.  Getting days and nights mixed up staying up all night and next day.   Past Medical History:  Diagnosis Date  . ALLERGIC RHINITIS 12/09/2006  . DEGENERATIVE JOINT DISEASE, LEFT KNEE 01/23/2007  . Encephalocele ([place]) 01/23/2007  . FEVER UNSPECIFIED 01/28/2010  . GERD 12/09/2006  . GLUCOSE INTOLERANCE 01/24/2009  . GOUT 12/09/2006  . HYPERCHOLESTEROLEMIA 12/09/2006  . HYPERLIPIDEMIA 12/18/2006  . HYPERTENSION 12/09/2006  . Impaired glucose tolerance 07/26/2010  . MENOPAUSAL DISORDER 01/25/2008   Past Surgical History:  Procedure Laterality Date  .  s/p anterior encephalocele repair and craniotomy  2003    reports that she has never smoked. She does not have any smokeless tobacco history on file. She reports that she does not drink alcohol or use drugs. family history includes Hypertension in her other. Allergies  Allergen Reactions  . Codeine   . Lipitor [Atorvastatin] Other (See Comments)    myalgia  . Lovastatin   . Pravastatin Sodium     Review of Systems Constitutional: Negative for increased diaphoresis, or other activity, appetite or siginficant weight change other than noted HENT: Negative for worsening hearing loss, ear pain, facial swelling, mouth sores and neck stiffness.   Eyes: Negative for other worsening pain, redness or visual disturbance.  Respiratory: Negative for choking or stridor Cardiovascular: Negative for other chest pain and palpitations.  Gastrointestinal: Negative for worsening diarrhea, blood in stool, or abdominal distention Genitourinary: Negative for hematuria, flank pain or change in urine volume.  Musculoskeletal: Negative for myalgias or other joint complaints.  Skin: Negative for other color change and wound or drainage.  Neurological: Negative for syncope and numbness. other than noted Hematological: Negative for adenopathy. or other swelling Psychiatric/Behavioral: Negative for hallucinations, SI, self-injury, decreased concentration or other worsening agitation.      Objective:   Physical Exam BP (!) 142/82   Pulse 76   Temp 98.6 F (37 C) (Oral)   Resp 20   Wt 256 lb (116.1 kg)   SpO2 96%   BMI 46.82 kg/m  VS noted,  Constitutional: Pt is  oriented to person, place, and time. Appears well-developed and well-nourished, in no significant distress Head: Normocephalic and atraumatic  Eyes: Conjunctivae and EOM are normal. Pupils are equal, round, and reactive to light Right Ear: External ear normal.  Left Ear: External ear normal Nose: Nose normal.  Mouth/Throat: Oropharynx is clear  and moist  Neck: Normal range of motion. Neck supple. No JVD present. No tracheal deviation present or significant neck LA or mass Cardiovascular: Normal rate, regular rhythm, normal heart sounds and intact distal pulses.   Pulmonary/Chest: Effort normal and breath sounds without rales or wheezing  Abdominal: Soft. Bowel sounds are normal. NT. No HSM  Musculoskeletal: Normal range of motion. Exhibits 1+ edema bilat LE's Lymphadenopathy: Has no cervical adenopathy.  Neurological: Pt is alert and oriented to person, place but not date or president. Pt has normal reflexes. No cranial nerve deficit. Motor grossly intact Skin: Skin is warm and dry. No rash noted or new ulcers Psychiatric:  Has normal mood and affect. Behavior is normal.     Assessment & Plan:

## 2016-01-01 NOTE — Progress Notes (Signed)
Pre visit review using our clinic review tool, if applicable. No additional management support is needed unless otherwise documented below in the visit note. 

## 2016-01-04 NOTE — Assessment & Plan Note (Signed)
For [facility] and PT

## 2016-01-04 NOTE — Assessment & Plan Note (Signed)
Likely due to newly worsening dementia, I asked family to take over med admin

## 2016-01-04 NOTE — Assessment & Plan Note (Signed)
stable overall by history and exam, recent data reviewed with pt, and pt to continue medical treatment as before,  to f/u any worsening symptoms or concerns BP Readings from Last 3 Encounters:  01/01/16 (!) 142/82  03/19/15 (!) 160/100  08/01/14 128/82

## 2016-01-04 NOTE — Assessment & Plan Note (Signed)
stable overall by history and exam, recent data reviewed with pt, and pt to continue medical treatment as before,  to f/u any worsening symptoms or concerns Lab Results  Component Value Date   HGBA1C 6.2 01/01/2016

## 2016-01-04 NOTE — Assessment & Plan Note (Signed)

## 2016-01-04 NOTE — Assessment & Plan Note (Addendum)
Mild worsening, for Vit b12, head mri, family reqeusts neurology referral  In addition to the time spent performing CPE, I spent an additional 40 minutes face to face,in which greater than 50% of this time was spent in counseling and coordination of care for patient's acute illness as documented.

## 2016-01-04 NOTE — Assessment & Plan Note (Signed)
Recent worsening, for lasix asd, echo,  to f/u any worsening symptoms or concerns

## 2016-01-14 DIAGNOSIS — R413 Other amnesia: Secondary | ICD-10-CM | POA: Diagnosis not present

## 2016-01-14 DIAGNOSIS — R531 Weakness: Secondary | ICD-10-CM | POA: Diagnosis not present

## 2016-01-14 DIAGNOSIS — I10 Essential (primary) hypertension: Secondary | ICD-10-CM | POA: Diagnosis not present

## 2016-01-20 DIAGNOSIS — R413 Other amnesia: Secondary | ICD-10-CM | POA: Diagnosis not present

## 2016-01-20 DIAGNOSIS — R531 Weakness: Secondary | ICD-10-CM | POA: Diagnosis not present

## 2016-01-20 DIAGNOSIS — I10 Essential (primary) hypertension: Secondary | ICD-10-CM | POA: Diagnosis not present

## 2016-01-24 ENCOUNTER — Emergency Department (HOSPITAL_COMMUNITY): Payer: Medicare Other

## 2016-01-24 ENCOUNTER — Telehealth: Payer: Self-pay | Admitting: Internal Medicine

## 2016-01-24 ENCOUNTER — Encounter (HOSPITAL_COMMUNITY): Payer: Self-pay | Admitting: Emergency Medicine

## 2016-01-24 ENCOUNTER — Inpatient Hospital Stay (HOSPITAL_COMMUNITY)
Admission: EM | Admit: 2016-01-24 | Discharge: 2016-01-28 | DRG: 175 | Disposition: A | Discharge status: Home Health | Payer: Medicare Other | Attending: Internal Medicine | Admitting: Internal Medicine

## 2016-01-24 DIAGNOSIS — I10 Essential (primary) hypertension: Secondary | ICD-10-CM | POA: Diagnosis present

## 2016-01-24 DIAGNOSIS — R069 Unspecified abnormalities of breathing: Secondary | ICD-10-CM | POA: Diagnosis not present

## 2016-01-24 DIAGNOSIS — J9601 Acute respiratory failure with hypoxia: Secondary | ICD-10-CM | POA: Diagnosis not present

## 2016-01-24 DIAGNOSIS — E876 Hypokalemia: Secondary | ICD-10-CM | POA: Diagnosis present

## 2016-01-24 DIAGNOSIS — F0390 Unspecified dementia without behavioral disturbance: Secondary | ICD-10-CM | POA: Diagnosis present

## 2016-01-24 DIAGNOSIS — J9690 Respiratory failure, unspecified, unspecified whether with hypoxia or hypercapnia: Secondary | ICD-10-CM | POA: Diagnosis not present

## 2016-01-24 DIAGNOSIS — E861 Hypovolemia: Secondary | ICD-10-CM | POA: Diagnosis present

## 2016-01-24 DIAGNOSIS — E7800 Pure hypercholesterolemia, unspecified: Secondary | ICD-10-CM | POA: Diagnosis not present

## 2016-01-24 DIAGNOSIS — E785 Hyperlipidemia, unspecified: Secondary | ICD-10-CM | POA: Diagnosis not present

## 2016-01-24 DIAGNOSIS — Z6841 Body Mass Index (BMI) 40.0 and over, adult: Secondary | ICD-10-CM | POA: Diagnosis not present

## 2016-01-24 DIAGNOSIS — Z7982 Long term (current) use of aspirin: Secondary | ICD-10-CM | POA: Diagnosis not present

## 2016-01-24 DIAGNOSIS — J810 Acute pulmonary edema: Secondary | ICD-10-CM

## 2016-01-24 DIAGNOSIS — N179 Acute kidney failure, unspecified: Secondary | ICD-10-CM | POA: Diagnosis present

## 2016-01-24 DIAGNOSIS — R531 Weakness: Secondary | ICD-10-CM | POA: Diagnosis not present

## 2016-01-24 DIAGNOSIS — Z79899 Other long term (current) drug therapy: Secondary | ICD-10-CM | POA: Diagnosis not present

## 2016-01-24 DIAGNOSIS — I82409 Acute embolism and thrombosis of unspecified deep veins of unspecified lower extremity: Secondary | ICD-10-CM | POA: Diagnosis not present

## 2016-01-24 DIAGNOSIS — R0902 Hypoxemia: Secondary | ICD-10-CM | POA: Diagnosis not present

## 2016-01-24 DIAGNOSIS — F03918 Unspecified dementia, unspecified severity, with other behavioral disturbance: Secondary | ICD-10-CM | POA: Diagnosis present

## 2016-01-24 DIAGNOSIS — I509 Heart failure, unspecified: Secondary | ICD-10-CM | POA: Diagnosis not present

## 2016-01-24 DIAGNOSIS — R0602 Shortness of breath: Secondary | ICD-10-CM | POA: Diagnosis not present

## 2016-01-24 DIAGNOSIS — I2782 Chronic pulmonary embolism: Secondary | ICD-10-CM | POA: Diagnosis not present

## 2016-01-24 DIAGNOSIS — J189 Pneumonia, unspecified organism: Secondary | ICD-10-CM

## 2016-01-24 DIAGNOSIS — I5032 Chronic diastolic (congestive) heart failure: Secondary | ICD-10-CM | POA: Diagnosis present

## 2016-01-24 DIAGNOSIS — I110 Hypertensive heart disease with heart failure: Secondary | ICD-10-CM | POA: Diagnosis not present

## 2016-01-24 DIAGNOSIS — Z86718 Personal history of other venous thrombosis and embolism: Secondary | ICD-10-CM | POA: Diagnosis present

## 2016-01-24 DIAGNOSIS — I2699 Other pulmonary embolism without acute cor pulmonale: Principal | ICD-10-CM | POA: Diagnosis present

## 2016-01-24 DIAGNOSIS — F0391 Unspecified dementia with behavioral disturbance: Secondary | ICD-10-CM | POA: Diagnosis present

## 2016-01-24 LAB — BASIC METABOLIC PANEL
Anion gap: 14 (ref 5–15)
BUN: 25 mg/dL — ABNORMAL HIGH (ref 6–20)
CO2: 29 mmol/L (ref 22–32)
Calcium: 9.9 mg/dL (ref 8.9–10.3)
Chloride: 98 mmol/L — ABNORMAL LOW (ref 101–111)
Creatinine, Ser: 1.69 mg/dL — ABNORMAL HIGH (ref 0.44–1.00)
GFR calc Af Amer: 31 mL/min — ABNORMAL LOW (ref >60)
GFR calc non Af Amer: 26 mL/min — ABNORMAL LOW (ref >60)
Glucose, Bld: 174 mg/dL — ABNORMAL HIGH (ref 65–99)
Potassium: 3.4 mmol/L — ABNORMAL LOW (ref 3.5–5.1)
Sodium: 141 mmol/L (ref 135–145)

## 2016-01-24 LAB — CBC WITH DIFFERENTIAL/PLATELET
Basophils Absolute: 0 10*3/uL (ref 0.0–0.1)
Basophils Relative: 0 %
Eosinophils Absolute: 0.1 10*3/uL (ref 0.0–0.7)
Eosinophils Relative: 1 %
HCT: 39.9 % (ref 36.0–46.0)
Hemoglobin: 12.9 g/dL (ref 12.0–15.0)
Lymphocytes Relative: 18 %
Lymphs Abs: 1.5 10*3/uL (ref 0.7–4.0)
MCH: 28.6 pg (ref 26.0–34.0)
MCHC: 32.3 g/dL (ref 30.0–36.0)
MCV: 88.5 fL (ref 78.0–100.0)
Monocytes Absolute: 0.9 10*3/uL (ref 0.1–1.0)
Monocytes Relative: 11 %
Neutro Abs: 5.8 10*3/uL (ref 1.7–7.7)
Neutrophils Relative %: 70 %
Platelets: 163 10*3/uL (ref 150–400)
RBC: 4.51 MIL/uL (ref 3.87–5.11)
RDW: 15.9 % — ABNORMAL HIGH (ref 11.5–15.5)
WBC: 8.3 10*3/uL (ref 4.0–10.5)

## 2016-01-24 LAB — I-STAT VENOUS BLOOD GAS, ED
Acid-Base Excess: 7 mmol/L — ABNORMAL HIGH (ref 0.0–2.0)
Bicarbonate: 31.7 mmol/L — ABNORMAL HIGH (ref 20.0–28.0)
O2 Saturation: 33 %
TCO2: 33 mmol/L (ref 0–100)
pCO2, Ven: 43.3 mmHg — ABNORMAL LOW (ref 44.0–60.0)
pH, Ven: 7.472 — ABNORMAL HIGH (ref 7.250–7.430)
pO2, Ven: 19 mmHg — CL (ref 32.0–45.0)

## 2016-01-24 LAB — I-STAT TROPONIN, ED: Troponin i, poc: 0.06 ng/mL (ref 0.00–0.08)

## 2016-01-24 LAB — BRAIN NATRIURETIC PEPTIDE: B Natriuretic Peptide: 429.5 pg/mL — ABNORMAL HIGH (ref 0.0–100.0)

## 2016-01-24 MED ORDER — DEXTROSE 5 % IV SOLN
500.0000 mg | Freq: Once | INTRAVENOUS | Status: AC
  Administered 2016-01-24: 500 mg via INTRAVENOUS
  Filled 2016-01-24: qty 500

## 2016-01-24 MED ORDER — FUROSEMIDE 10 MG/ML IJ SOLN
40.0000 mg | Freq: Once | INTRAMUSCULAR | Status: AC
  Administered 2016-01-24: 40 mg via INTRAVENOUS
  Filled 2016-01-24: qty 4

## 2016-01-24 MED ORDER — CEFTRIAXONE SODIUM 1 G IJ SOLR
1.0000 g | Freq: Once | INTRAMUSCULAR | Status: AC
Start: 2016-01-24 — End: 2016-01-24
  Administered 2016-01-24: 1 g via INTRAVENOUS
  Filled 2016-01-24: qty 10

## 2016-01-24 NOTE — ED Provider Notes (Signed)
[facility] DEPT Provider Note   CSN: [id number] Arrival date & time: 01/24/16  2102     History   Chief Complaint Chief Complaint  Patient presents with  . Shortness of Breath    HPI Heather [name] is a 82 y.o. female.  The history is provided by the patient and the EMS personnel.  Shortness of Breath  This is a new problem. The average episode lasts 1 week. The problem occurs rarely.The current episode started more than 2 days ago. The problem has been gradually worsening. It is unknown what precipitated the problem. She has tried ipratropium inhalers, oral steroids and beta-agonist inhalers for the symptoms. The treatment provided moderate relief. She has had no prior hospitalizations. She has had no prior ED visits. She has had no prior ICU admissions. Associated medical issues do not include asthma, COPD, pneumonia, PE, CAD, heart failure or DVT.   82 year old female who presents with shortness of breath. She has a history of hypertension and hyperlipidemia. History is provided by both the patient and EMS. She states onset of gradually progressively worsening shortness of breath over the course of this past week. Initially with dyspnea on exertion, and today with dyspnea at rest. EMS was called out by family, and on their arrival, patient with audible wheezing, labored breathing, and appearing tired with almost near syncopal episode. She was placed on CPAP, given Solu-Medrol, albuterol, and Atrovent. Was subsequently brought to ED for evaluation. Patient denies any fever, coughing, congestion. Has noted some lower extremity edema but no abdominal distention, orthopnea or PND. She denies any chest pain. She has no underlying lung problems and does not wear oxygen at baseline. No smoking   Past Medical History:  Diagnosis Date  . ALLERGIC RHINITIS 12/09/2006  . DEGENERATIVE JOINT DISEASE, LEFT KNEE 01/23/2007  . Encephalocele ([place]) 01/23/2007  . FEVER UNSPECIFIED 01/28/2010  .  GERD 12/09/2006  . GLUCOSE INTOLERANCE 01/24/2009  . GOUT 12/09/2006  . HYPERCHOLESTEROLEMIA 12/09/2006  . HYPERLIPIDEMIA 12/18/2006  . HYPERTENSION 12/09/2006  . Impaired glucose tolerance 07/26/2010  . MENOPAUSAL DISORDER 01/25/2008    Patient Active Problem List   Diagnosis Date Noted  . Acute respiratory failure with hypoxia ([place]) 01/24/2016  . Memory dysfunction 01/01/2016  . Non compliance w medication regimen 01/01/2016  . General weakness 01/01/2016  . Peripheral edema 01/01/2016  . Chronic venous insufficiency 08/01/2014  . Fall at home 08/01/2013  . Osteopenia 08/01/2013  . Left knee pain 01/29/2011  . Encounter for well adult exam with abnormal findings 07/26/2010  . Impaired glucose tolerance 07/26/2010  . MENOPAUSAL DISORDER 01/25/2008  . Osteoarthrosis, unspecified whether generalized or localized, involving lower leg 01/23/2007  . Encephalocele ([place]) 01/23/2007  . Hyperlipidemia 12/18/2006  . GOUT 12/09/2006  . Essential hypertension 12/09/2006  . ALLERGIC RHINITIS 12/09/2006  . GERD 12/09/2006    Past Surgical History:  Procedure Laterality Date  . s/p anterior encephalocele repair and craniotomy  2003    OB History    No data available       Home Medications    Prior to Admission medications   Medication Sig Start Date End Date Taking? Authorizing Provider  amLODipine (NORVASC) 5 MG tablet Take 1 tablet (5 mg total) by mouth daily. 01/01/16  Yes Biagio Borg, MD  aspirin 325 MG EC tablet Take 325 mg by mouth daily.     Yes Historical Provider, MD  benazepril (LOTENSIN) 40 MG tablet Take 1 tablet (40 mg total) by mouth daily. 01/01/16  Yes  Biagio Borg, MD  carvedilol (COREG) 25 MG tablet Take 1 tablet (25 mg total) by mouth 2 (two) times daily with a meal. 01/01/16  Yes Biagio Borg, MD  furosemide (LASIX) 40 MG tablet Take 1 tablet (40 mg total) by mouth daily. 01/01/16  Yes Biagio Borg, MD  ibuprofen (ADVIL,MOTRIN) 200 MG tablet Take 400 mg by mouth every  6 (six) hours as needed for moderate pain.   Yes Historical Provider, MD    Family History Family History  Problem Relation Age of Onset  . Hypertension Other     Social History Social History  Substance Use Topics  . Smoking status: Never Smoker  . Smokeless tobacco: Never Used  . Alcohol use No     Allergies   Lipitor [atorvastatin]; Codeine; Lovastatin; and Pravastatin sodium   Review of Systems Review of Systems  Respiratory: Positive for shortness of breath.   10/14 systems reviewed and are negative other than those stated in the HPI    Physical Exam Updated Vital Signs BP 102/77   Pulse (!) 53   Temp 98 F (36.7 C) (Oral)   Resp 20   Ht 5\' 2"  (1.575 m)   Wt 252 lb (114.3 kg)   SpO2 100%   BMI 46.09 kg/m   Physical Exam Physical Exam  Nursing note and vitals reviewed. Constitutional: non-toxic, and in no acute distress Head: Normocephalic and atraumatic.  Mouth/Throat: Oropharynx is clear and moist.  Neck: Normal range of motion. Neck supple.  Cardiovascular: Normal rate and regular rhythm.   Pulmonary/Chest: Effort normal, breath sounds clear but slightly diminished over left lung bases, no conversational dyspnea Abdominal: Soft. There is no tenderness. There is no rebound and no guarding.  Musculoskeletal: Normal range of motion.  Neurological: Alert, no facial droop, fluent speech, moves all extremities symmetrically Skin: Skin is warm and dry.  Psychiatric: Cooperative   ED Treatments / Results  Labs (all labs ordered are listed, but only abnormal results are displayed) Labs Reviewed  CBC WITH DIFFERENTIAL/PLATELET - Abnormal; Notable for the following:       Result Value   RDW 15.9 (*)    All other components within normal limits  BASIC METABOLIC PANEL - Abnormal; Notable for the following:    Potassium 3.4 (*)    Chloride 98 (*)    Glucose, Bld 174 (*)    BUN 25 (*)    Creatinine, Ser 1.69 (*)    GFR calc non Af Amer 26 (*)    GFR  calc Af Amer 31 (*)    All other components within normal limits  BRAIN NATRIURETIC PEPTIDE - Abnormal; Notable for the following:    B Natriuretic Peptide 429.5 (*)    All other components within normal limits  I-STAT VENOUS BLOOD GAS, ED - Abnormal; Notable for the following:    pH, Ven 7.472 (*)    pCO2, Ven 43.3 (*)    pO2, Ven 19.0 (*)    Bicarbonate 31.7 (*)    Acid-Base Excess 7.0 (*)    All other components within normal limits  I-STAT TROPOININ, ED    EKG  EKG Interpretation None       Radiology Dg Chest 2 View  Result Date: 01/24/2016 CLINICAL DATA:  One week of shortness of breath EXAM: CHEST  2 VIEW COMPARISON:  None available FINDINGS: AP and lateral views of the chest. Low lung volumes. Eventration of right diaphragm. Hazy left upper lobe infiltrate. There is mild cardiomegaly with  atherosclerosis of the aorta. Linear scar atelectasis in the right mid lung zone. Double density overlying the lower mediastinum, could relate to a large hiatal hernia. IMPRESSION: 1. Mild cardiomegaly. 2. Hazy opacity in the left lung could relate to mild infiltrate or asymmetric edema. 3. Oval opacity projects over lower mediastinum, this could relate to a large hiatal hernia. CT or swallow study could be obtained to confirm. 4. Atherosclerotic vascular disease of the aorta. Electronically Signed   By: Donavan Foil M.D.   On: 01/24/2016 22:11    Procedures Procedures (including critical care time)  Medications Ordered in ED Medications  furosemide (LASIX) injection 40 mg (40 mg Intravenous Given 01/24/16 2316)  cefTRIAXone (ROCEPHIN) 1 g in dextrose 5 % 50 mL IVPB (1 g Intravenous Transfusing/Transfer 01/25/16 0041)  azithromycin (ZITHROMAX) 500 mg in dextrose 5 % 250 mL IVPB (500 mg Intravenous Rate/Dose Change 01/25/16 0041)     Initial Impression / Assessment and Plan / ED Course  I have reviewed the triage vital signs and the nursing notes.  Pertinent labs & imaging results  that were available during my care of the patient were reviewed by me and considered in my medical decision making (see chart for details).  Clinical Course    82 year old female Who presents with respiratory distress. Differential initially includes pneumonia, pulmonary edema, atypical ACS presentation, asthma/copd exacerbation (although later on chart review, no prior history of this).  On presentation she was trialed off of her CPAP and placed on 2 L nasal cannula with normal work of breathing and no conversational dyspnea.  She does have a persistent oxygen requirement while here in the emergency department. With mild lower extremity edema, and with chest x-ray that shows left lung opacities concerning for pneumonia versus asymmetric pulmonary edema. Presentation seem likely more consistent with CHF and pulmonary edema, and BNP elevate 400s. Troponin is negative, but she does have some new T-wave inversions in the inferior and anterolateral leads. She is given IV Lasix and also empirically treated for potential community acquired pneumonia with ceftriaxone and azithromycin. Discussed with Dr. Hal Hope who will admit her.    Final Clinical Impressions(s) / ED Diagnoses   Final diagnoses:  Community acquired pneumonia of left lung, unspecified part of lung  Acute pulmonary edema ([place])    New Prescriptions New Prescriptions   No medications on file     Forde Dandy, MD 01/25/16 0045

## 2016-01-24 NOTE — Telephone Encounter (Signed)
PLEASE NOTE: All timestamps contained within this report are represented as [place] Standard Time. CONFIDENTIALTY NOTICE: This fax transmission is intended only for the addressee. It contains information that is legally privileged, confidential or otherwise protected from use or disclosure. If you are not the intended recipient, you are strictly prohibited from reviewing, disclosing, copying using or disseminating any of this information or taking any action in reliance on or regarding this information. If you have received this fax in error, please notify us immediately by telephone so that we can arrange for its return to [place]. Phone: [phone], Toll-Free: [phone], Fax: [phone] Page: 1 of 1 Call Id: [id number] [place] [facility] - Client [place] Patient Name: [facility] DOB: [birth year] Nurse Assessment Nurse: Germain Osgood, RN, Jane Date/Time (Eastern Time): 01/24/2016 4:20:15 PM Confirm and document reason for call. If symptomatic, describe symptoms. You must click the next button to save text entered. ---Caller states mother took her medication twice by accident. Benzapril 40 mg, Amlodipine 5 mg , Furosemide 40 mg Takes BID, Carvedilol 25 mg BP 160/41 - 71, NOw 115/73 pulse 63 Breathing Shallow No eating. Has the patient traveled out of the country within the last 30 days? ---No Does the patient have any new or worsening symptoms? ---Yes Will a triage be completed? ---Yes Related visit to physician within the last 2 weeks? ---No Does the PT have any chronic conditions? (i.e. diabetes, asthma, etc.) ---Yes List chronic conditions. ---Hypertension, Is this a behavioral health or substance abuse call? ---No Guidelines Guideline Title Affirmed Question Affirmed Notes Poisoning [1] DOUBLE DOSE (an extra dose or lesser amount) of over-the-counter (OTC) drug AND [2] any symptoms (e.g., dizziness, nausea, pain,  sleepiness) Final Disposition User Call [place] Now Germain Osgood, RN, Opal Sidles Disagree/Comply: Leta Baptist

## 2016-01-24 NOTE — ED Triage Notes (Signed)
PT coming from home. Pt presedn with SOB. Pt reports being SOB for approximately 1 week now and it is wrse with movement. Upon arriving pt lungs were lungs were diminished and audible wheezing. While enroute pt appeared to be losing consciouness. Pt lung sounds were now barely adible and . Pt was given 125 solumedrol, 15 albuterol, and 1 of atrovent. Pt was 92% on CPAP and 93% originally on RA. 138/90 28R 65HR 200CG

## 2016-01-25 ENCOUNTER — Encounter (HOSPITAL_COMMUNITY): Payer: Self-pay | Admitting: Internal Medicine

## 2016-01-25 ENCOUNTER — Inpatient Hospital Stay (HOSPITAL_COMMUNITY): Payer: Medicare Other

## 2016-01-25 DIAGNOSIS — E785 Hyperlipidemia, unspecified: Secondary | ICD-10-CM

## 2016-01-25 DIAGNOSIS — J9601 Acute respiratory failure with hypoxia: Secondary | ICD-10-CM

## 2016-01-25 DIAGNOSIS — I10 Essential (primary) hypertension: Secondary | ICD-10-CM

## 2016-01-25 DIAGNOSIS — I2699 Other pulmonary embolism without acute cor pulmonale: Principal | ICD-10-CM

## 2016-01-25 DIAGNOSIS — N179 Acute kidney failure, unspecified: Secondary | ICD-10-CM

## 2016-01-25 LAB — EXPECTORATED SPUTUM ASSESSMENT W GRAM STAIN, RFLX TO RESP C

## 2016-01-25 LAB — CREATININE, SERUM
Creatinine, Ser: 1.69 mg/dL — ABNORMAL HIGH (ref 0.44–1.00)
GFR calc Af Amer: 31 mL/min — ABNORMAL LOW (ref >60)
GFR calc non Af Amer: 26 mL/min — ABNORMAL LOW (ref >60)

## 2016-01-25 LAB — CBC WITH DIFFERENTIAL/PLATELET
Basophils Absolute: 0 10*3/uL (ref 0.0–0.1)
Basophils Relative: 0 %
Eosinophils Absolute: 0 10*3/uL (ref 0.0–0.7)
Eosinophils Relative: 0 %
HCT: 39.4 % (ref 36.0–46.0)
Hemoglobin: 12.5 g/dL (ref 12.0–15.0)
Lymphocytes Relative: 13 %
Lymphs Abs: 1.1 10*3/uL (ref 0.7–4.0)
MCH: 28 pg (ref 26.0–34.0)
MCHC: 31.7 g/dL (ref 30.0–36.0)
MCV: 88.3 fL (ref 78.0–100.0)
Monocytes Absolute: 0.3 10*3/uL (ref 0.1–1.0)
Monocytes Relative: 4 %
Neutro Abs: 6.8 10*3/uL (ref 1.7–7.7)
Neutrophils Relative %: 83 %
Platelets: 155 10*3/uL (ref 150–400)
RBC: 4.46 MIL/uL (ref 3.87–5.11)
RDW: 15.6 % — ABNORMAL HIGH (ref 11.5–15.5)
WBC: 8.2 10*3/uL (ref 4.0–10.5)

## 2016-01-25 LAB — D-DIMER, QUANTITATIVE: D-Dimer, Quant: >20 ug/mL-FEU — ABNORMAL HIGH (ref 0.00–0.50)

## 2016-01-25 LAB — CBC
HCT: 39.5 % (ref 36.0–46.0)
Hemoglobin: 12.8 g/dL (ref 12.0–15.0)
MCH: 28.3 pg (ref 26.0–34.0)
MCHC: 32.4 g/dL (ref 30.0–36.0)
MCV: 87.4 fL (ref 78.0–100.0)
Platelets: 160 10*3/uL (ref 150–400)
RBC: 4.52 MIL/uL (ref 3.87–5.11)
RDW: 15.5 % (ref 11.5–15.5)
WBC: 8.3 10*3/uL (ref 4.0–10.5)

## 2016-01-25 LAB — COMPREHENSIVE METABOLIC PANEL
ALT: 17 U/L (ref 14–54)
AST: 23 U/L (ref 15–41)
Albumin: 3.1 g/dL — ABNORMAL LOW (ref 3.5–5.0)
Alkaline Phosphatase: 62 U/L (ref 38–126)
Anion gap: 13 (ref 5–15)
BUN: 30 mg/dL — ABNORMAL HIGH (ref 6–20)
CO2: 28 mmol/L (ref 22–32)
Calcium: 9.7 mg/dL (ref 8.9–10.3)
Chloride: 99 mmol/L — ABNORMAL LOW (ref 101–111)
Creatinine, Ser: 1.73 mg/dL — ABNORMAL HIGH (ref 0.44–1.00)
GFR calc Af Amer: 30 mL/min — ABNORMAL LOW (ref >60)
GFR calc non Af Amer: 26 mL/min — ABNORMAL LOW (ref >60)
Glucose, Bld: 149 mg/dL — ABNORMAL HIGH (ref 65–99)
Potassium: 3.8 mmol/L (ref 3.5–5.1)
Sodium: 140 mmol/L (ref 135–145)
Total Bilirubin: 1 mg/dL (ref 0.3–1.2)
Total Protein: 6.8 g/dL (ref 6.5–8.1)

## 2016-01-25 LAB — TROPONIN I
Troponin I: 0.05 ng/mL (ref <0.03)
Troponin I: 0.05 ng/mL (ref <0.03)
Troponin I: 0.04 ng/mL (ref <0.03)

## 2016-01-25 LAB — HEPARIN LEVEL (UNFRACTIONATED): Heparin Unfractionated: 1.22 IU/mL — ABNORMAL HIGH (ref 0.30–0.70)

## 2016-01-25 LAB — TSH: TSH: 1.485 u[IU]/mL (ref 0.350–4.500)

## 2016-01-25 MED ORDER — ONDANSETRON HCL 4 MG/2ML IJ SOLN: 4.0000 mg | Freq: Four times a day (QID) | INTRAMUSCULAR | Status: DC | PRN

## 2016-01-25 MED ORDER — DEXTROSE 5 % IV SOLN
1.0000 g | INTRAVENOUS | Status: DC
  Filled 2016-01-25: qty 10

## 2016-01-25 MED ORDER — AMLODIPINE BESYLATE 5 MG PO TABS
5.0000 mg | ORAL_TABLET | Freq: Every day | ORAL | Status: DC
  Administered 2016-01-25 – 2016-01-28 (×4): 5 mg via ORAL
  Filled 2016-01-25 (×4): qty 1

## 2016-01-25 MED ORDER — TECHNETIUM TC 99M DIETHYLENETRIAME-PENTAACETIC ACID: 31.4000 | Freq: Once | INTRAVENOUS | Status: DC | PRN

## 2016-01-25 MED ORDER — ACETAMINOPHEN 650 MG RE SUPP: 650.0000 mg | Freq: Four times a day (QID) | RECTAL | Status: DC | PRN

## 2016-01-25 MED ORDER — POTASSIUM CHLORIDE CRYS ER 10 MEQ PO TBCR
10.0000 meq | EXTENDED_RELEASE_TABLET | Freq: Every day | ORAL | Status: DC
  Administered 2016-01-25 – 2016-01-28 (×5): 10 meq via ORAL
  Filled 2016-01-25 (×5): qty 1

## 2016-01-25 MED ORDER — ASPIRIN EC 325 MG PO TBEC
325.0000 mg | DELAYED_RELEASE_TABLET | Freq: Every day | ORAL | Status: DC
  Administered 2016-01-25 – 2016-01-28 (×4): 325 mg via ORAL
  Filled 2016-01-25 (×4): qty 1

## 2016-01-25 MED ORDER — ONDANSETRON HCL 4 MG PO TABS: 4.0000 mg | ORAL_TABLET | Freq: Four times a day (QID) | ORAL | Status: DC | PRN

## 2016-01-25 MED ORDER — ENOXAPARIN SODIUM 30 MG/0.3ML ~~LOC~~ SOLN
30.0000 mg | SUBCUTANEOUS | Status: DC
  Administered 2016-01-25: 30 mg via SUBCUTANEOUS
  Filled 2016-01-25 (×2): qty 0.3

## 2016-01-25 MED ORDER — ACETAMINOPHEN 325 MG PO TABS
650.0000 mg | ORAL_TABLET | Freq: Four times a day (QID) | ORAL | Status: DC | PRN
  Administered 2016-01-25 – 2016-01-27 (×2): 650 mg via ORAL
  Filled 2016-01-25 (×2): qty 2

## 2016-01-25 MED ORDER — HEPARIN BOLUS VIA INFUSION
4000.0000 [IU] | Freq: Once | INTRAVENOUS | Status: AC
  Administered 2016-01-25: 4000 [IU] via INTRAVENOUS
  Filled 2016-01-25: qty 4000

## 2016-01-25 MED ORDER — TECHNETIUM TO 99M ALBUMIN AGGREGATED
4.3000 | Freq: Once | INTRAVENOUS | Status: AC | PRN
  Administered 2016-01-25: 4.3 via INTRAVENOUS

## 2016-01-25 MED ORDER — ONDANSETRON HCL 4 MG/2ML IJ SOLN
4.0000 mg | Freq: Once | INTRAMUSCULAR | Status: AC
  Administered 2016-01-25: 4 mg via INTRAVENOUS
  Filled 2016-01-25: qty 2

## 2016-01-25 MED ORDER — SODIUM CHLORIDE 0.9% FLUSH
3.0000 mL | Freq: Two times a day (BID) | INTRAVENOUS | Status: DC
  Administered 2016-01-25 – 2016-01-28 (×8): 3 mL via INTRAVENOUS

## 2016-01-25 MED ORDER — ALPRAZOLAM 0.25 MG PO TABS
0.2500 mg | ORAL_TABLET | Freq: Once | ORAL | Status: AC
  Administered 2016-01-25: 0.25 mg via ORAL
  Filled 2016-01-25: qty 1

## 2016-01-25 MED ORDER — DEXTROSE 5 % IV SOLN
500.0000 mg | INTRAVENOUS | Status: DC
  Filled 2016-01-25: qty 500

## 2016-01-25 MED ORDER — CARVEDILOL 25 MG PO TABS
25.0000 mg | ORAL_TABLET | Freq: Two times a day (BID) | ORAL | Status: DC
  Administered 2016-01-25 – 2016-01-28 (×7): 25 mg via ORAL
  Filled 2016-01-25 (×7): qty 1

## 2016-01-25 MED ORDER — FUROSEMIDE 10 MG/ML IJ SOLN
40.0000 mg | Freq: Every day | INTRAMUSCULAR | Status: DC
  Administered 2016-01-25: 40 mg via INTRAVENOUS
  Filled 2016-01-25: qty 4

## 2016-01-25 MED ORDER — ALBUTEROL SULFATE (2.5 MG/3ML) 0.083% IN NEBU
2.5000 mg | INHALATION_SOLUTION | RESPIRATORY_TRACT | Status: DC | PRN
  Administered 2016-01-25 – 2016-01-28 (×6): 2.5 mg via RESPIRATORY_TRACT
  Filled 2016-01-25 (×6): qty 3

## 2016-01-25 MED ORDER — HEPARIN (PORCINE) IN NACL 100-0.45 UNIT/ML-% IJ SOLN
1000.0000 [IU]/h | INTRAMUSCULAR | Status: DC
  Administered 2016-01-25: 1300 [IU]/h via INTRAVENOUS
  Filled 2016-01-25: qty 250

## 2016-01-25 NOTE — Progress Notes (Signed)
[place] for heparin Indication: pulmonary embolus  Allergies  Allergen Reactions  . Lipitor [Atorvastatin] Other (See Comments)    myalgia  . Codeine Other (See Comments)  . Lovastatin Other (See Comments)  . Pravastatin Sodium Other (See Comments)    Patient Measurements: Height: 5\' 4"  (162.6 cm) Weight: 251 lb 9.6 oz (114.1 kg) IBW/kg (Calculated) : 54.7 Heparin Dosing Weight: 82 kg  Vital Signs: Temp: 98.4 F (36.9 C) (10/14 2354) Temp Source: Oral (10/14 1427) BP: 131/54 (10/14 2354) Pulse Rate: 57 (10/14 2354)  Labs:  Recent Labs  01/24/16 2122 01/25/16 0330 01/25/16 0736 01/25/16 1356 01/25/16 2258  HGB 12.9 12.8 12.5  --   --   HCT 39.9 39.5 39.4  --   --   PLT 163 160 155  --   --   HEPARINUNFRC  --   --   --   --  1.22*  CREATININE 1.69* 1.69* 1.73*  --   --   TROPONINI  --  0.05* 0.05* 0.04*  --     Estimated Creatinine Clearance: 29.5 mL/min (by C-G formula based on SCr of 1.73 mg/dL (H)).  Assessment: 82 y.o. female with PE for heparin  Goal of Therapy:  Heparin level 0.3-0.7 units/ml Monitor platelets by anticoagulation protocol: Yes   Plan:  Decrease Heparin 1000 units/hr Follow-up am labs.  Phillis Knack, PharmD, BCPS  01/25/2016

## 2016-01-25 NOTE — Progress Notes (Signed)
V/Q scan shows high probability for PE, pt is stabel right now sat in the 90's with oxygen 3l nasal canula. MD paged.  Ferdinand Lango, RN

## 2016-01-25 NOTE — Progress Notes (Signed)
Patient not coming

## 2016-01-25 NOTE — Progress Notes (Signed)
PROGRESS NOTE                                                                                                                                                                                                             Patient Demographics:    Heather [name], is a 82 y.o. female, DOB - [birth year], [id number]  Admit date - 01/24/2016   Admitting Physician Rise Patience, MD  Outpatient Primary MD for the patient is [name], MD  LOS - 1  Outpatient Specialists:None   Chief Complaint  Patient presents with  . Shortness of Breath       Brief Narrative   82 year old female with hypertension, peripheral edema, mild dementia presented to the ER with worsening shortness of breath for past few days worsened on the day of admission. She denied any chest pain or productive cough. In the ED vitals were stable with chest x-ray showing possible congestion versus infiltrate. BNP was elevated to 400. Patient also reported generalized weakness with difficulty ambulating. Patient admitted for possible CHF versus pneumonia. D-dimer was found to be significantly elevated. A CT scan done showed high probability for PE.    Subjective:   Patient informs her breathing to be better. Denies chest pain or productive cough.   Assessment  & Plan :    Principal Problem:   Acute respiratory failure with hypoxia (HCC) Suspect this is due to his acute PE. VQ scan with high probability for PE Start on IV heparin.  once renal function normalizes I can transition her to new anticoagulant. Follow 2-D echo and Doppler lower extremities. - O2 via nasal cannula. When necessary albuterol nebs for wheezing. Pneumonia seems unlikely. I will discontinue antibiotics.   Active Problems: Acute kidney injury (HCC) Hold ARB and Lasix. Check urine lites. Monitor in a.m. labs.  Hypokalemia Replenish  Essential hypertension   continue  amlodipine   Generalized weakness PT evaluation      Code Status : Full code  Family Communication  : Family at bedside  Disposition Plan  : Home once symptoms better, hopefully in the next 48 hours  Barriers For Discharge : Active symptoms  Consults  : None  Procedures  :  VQ scan  DVT Prophylaxis  :  IV heparin  Lab Results  Component Value Date   PLT 155 01/25/2016  Antibiotics  :    Anti-infectives    Start     Dose/Rate Route Frequency Ordered Stop   01/25/16 2200  cefTRIAXone (ROCEPHIN) 1 g in dextrose 5 % 50 mL IVPB     1 g 100 mL/hr over 30 Minutes Intravenous Every 24 hours 01/25/16 0044 02/01/16 2159   01/25/16 2200  azithromycin (ZITHROMAX) 500 mg in dextrose 5 % 250 mL IVPB     500 mg 250 mL/hr over 60 Minutes Intravenous Every 24 hours 01/25/16 0044 02/01/16 2159   01/24/16 2245  cefTRIAXone (ROCEPHIN) 1 g in dextrose 5 % 50 mL IVPB     1 g 100 mL/hr over 30 Minutes Intravenous  Once 01/24/16 2244 01/24/16 2345   01/24/16 2245  azithromycin (ZITHROMAX) 500 mg in dextrose 5 % 250 mL IVPB     500 mg 250 mL/hr over 60 Minutes Intravenous  Once 01/24/16 2244 01/25/16 0132        Objective:   Vitals:   01/25/16 0800 01/25/16 0844 01/25/16 1018 01/25/16 1211  BP: 135/71 (!) 144/70 121/64   Pulse: (!) 59 61    Resp: 17     Temp:  97.8 F (36.6 C)    TempSrc:  Oral    SpO2: 99% 92%  92%  Weight:  114.1 kg (251 lb 9.6 oz)    Height:  5\' 4"  (1.626 m)      Wt Readings from Last 3 Encounters:  01/25/16 114.1 kg (251 lb 9.6 oz)  01/01/16 116.1 kg (256 lb)  08/01/14 115.2 kg (254 lb 0.6 oz)     Intake/Output Summary (Last 24 hours) at 01/25/16 1403 Last data filed at 01/25/16 0214  Gross per 24 hour  Intake              256 ml  Output                0 ml  Net              256 ml     Physical Exam  Gen: not in distress HEENT: No pallor, moist, supple neck Chest: Extremity wheezing bilaterally CVS: N S1&S2, no murmurs, rubs or  gallop GI: soft, NT, ND, BS+ Musculoskeletal: warm, trace edema,Lt leg>Rt leg CNS: Alert and oriented    Data Review:    CBC  Recent Labs Lab 01/24/16 2122 01/25/16 0330 01/25/16 0736  WBC 8.3 8.3 8.2  HGB 12.9 12.8 12.5  HCT 39.9 39.5 39.4  PLT 163 160 155  MCV 88.5 87.4 88.3  MCH 28.6 28.3 28.0  MCHC 32.3 32.4 31.7  RDW 15.9* 15.5 15.6*  LYMPHSABS 1.5  --  1.1  MONOABS 0.9  --  0.3  EOSABS 0.1  --  0.0  BASOSABS 0.0  --  0.0    Chemistries   Recent Labs Lab 01/24/16 2122 01/25/16 0330 01/25/16 0736  NA 141  --  140  K 3.4*  --  3.8  CL 98*  --  99*  CO2 29  --  28  GLUCOSE 174*  --  149*  BUN 25*  --  30*  CREATININE 1.69* 1.69* 1.73*  CALCIUM 9.9  --  9.7  AST  --   --  23  ALT  --   --  17  ALKPHOS  --   --  62  BILITOT  --   --  1.0   ------------------------------------------------------------------------------------------------------------------ No results for input(s): CHOL, HDL, LDLCALC, TRIG, CHOLHDL, LDLDIRECT in the last  72 hours.  Lab Results  Component Value Date   HGBA1C 6.2 01/01/2016   ------------------------------------------------------------------------------------------------------------------  Recent Labs  01/25/16 0042  TSH 1.485   ------------------------------------------------------------------------------------------------------------------ No results for input(s): VITAMINB12, FOLATE, FERRITIN, TIBC, IRON, RETICCTPCT in the last 72 hours.  Coagulation profile No results for input(s): INR, PROTIME in the last 168 hours.   Recent Labs  01/24/16 0110  DDIMER >20.00*    Cardiac Enzymes  Recent Labs Lab 01/25/16 0330 01/25/16 0736  TROPONINI 0.05* 0.05*   ------------------------------------------------------------------------------------------------------------------    Component Value Date/Time   BNP 429.5 (H) 01/24/2016 2122    Inpatient Medications  Scheduled Meds: . amLODipine  5 mg Oral Daily   . aspirin  325 mg Oral Daily  . azithromycin  500 mg Intravenous Q24H  . carvedilol  25 mg Oral BID WC  . cefTRIAXone (ROCEPHIN)  IV  1 g Intravenous Q24H  . enoxaparin (LOVENOX) injection  30 mg Subcutaneous Q24H  . furosemide  40 mg Intravenous Daily  . potassium chloride  10 mEq Oral Daily  . sodium chloride flush  3 mL Intravenous Q12H   Continuous Infusions:  PRN Meds:.acetaminophen **OR** acetaminophen, albuterol, ondansetron **OR** ondansetron (ZOFRAN) IV, technetium TC 49M diethylenetriame-pentaacetic acid  Micro Results Recent Results (from the past 240 hour(s))  Culture, sputum-assessment     Status: None   Collection Time: 01/25/16  8:28 AM  Result Value Ref Range Status   Specimen Description SPUTUM  Final   Special Requests NONE  Final   Sputum evaluation   Final    THIS SPECIMEN IS ACCEPTABLE. RESPIRATORY CULTURE REPORT TO FOLLOW.   Report Status 01/25/2016 FINAL  Final    Radiology Reports Dg Chest 2 View  Result Date: 01/24/2016 CLINICAL DATA:  One week of shortness of breath EXAM: CHEST  2 VIEW COMPARISON:  None available FINDINGS: AP and lateral views of the chest. Low lung volumes. Eventration of right diaphragm. Hazy left upper lobe infiltrate. There is mild cardiomegaly with atherosclerosis of the aorta. Linear scar atelectasis in the right mid lung zone. Double density overlying the lower mediastinum, could relate to a large hiatal hernia. IMPRESSION: 1. Mild cardiomegaly. 2. Hazy opacity in the left lung could relate to mild infiltrate or asymmetric edema. 3. Oval opacity projects over lower mediastinum, this could relate to a large hiatal hernia. CT or swallow study could be obtained to confirm. 4. Atherosclerotic vascular disease of the aorta. Electronically Signed   By: Donavan Foil M.D.   On: 01/24/2016 22:11   Nm Pulmonary Perf And Vent  Result Date: 01/25/2016 CLINICAL DATA:  Hypoxia. EXAM: NUCLEAR MEDICINE VENTILATION - PERFUSION LUNG SCAN TECHNIQUE:  Ventilation images were obtained in multiple projections using inhaled aerosol Tc-75m DTPA. Perfusion images were obtained in multiple projections after intravenous injection of Tc-62m MAA. RADIOPHARMACEUTICALS:  31.4 mCi Technetium-65m DTPA aerosol inhalation and 4.3 mCi Technetium-28m MAA IV COMPARISON:  Chest x-ray from yesterday FINDINGS: There are multiple matched ventilation and perfusion defects. There are at least 2 large wedge-shaped defects on the right which are more prominent on perfusion than ventilation imaging. IMPRESSION: High probability V/Q scan. The multiple matched defects suggest underlying airways disease as well. Findings called to the patient's nurse, Caesar. He will inform the referring physician. Electronically Signed   By: Dorise Bullion III M.D   On: 01/25/2016 13:47    Time Spent in minutes  25   Louellen Molder M.D on 01/25/2016 at 2:03 PM  Between 7am to 7pm - Pager - [phone]  After 7pm go to [email or web address] - password [facility]  [facility] -  Office  [phone]

## 2016-01-25 NOTE — Progress Notes (Signed)
Called to ED for Report on patient.

## 2016-01-25 NOTE — Progress Notes (Signed)
PT Cancellation Note  Patient Details Name: Heather [name] MRN: [id number] DOB: [birth year]   Cancelled Treatment:    Reason Eval/Treat Not Completed: Patient at procedure or test/unavailable (patient OTF, workup underway, will follow) Elevated troponin noted   Duncan Dull 01/25/2016, 6:36 AM Alben Deeds, PT DPT  [phone]

## 2016-01-25 NOTE — Progress Notes (Signed)
ANTICOAGULATION CONSULT NOTE - Initial Consult  Pharmacy Consult for heparin Indication: pulmonary embolus  Allergies  Allergen Reactions  . Lipitor [Atorvastatin] Other (See Comments)    myalgia  . Codeine Other (See Comments)  . Lovastatin Other (See Comments)  . Pravastatin Sodium Other (See Comments)    Patient Measurements: Height: 5\' 4"  (162.6 cm) Weight: 251 lb 9.6 oz (114.1 kg) IBW/kg (Calculated) : 54.7 Heparin Dosing Weight: 82 kg  Vital Signs: Temp: 97.8 F (36.6 C) (10/14 0844) Temp Source: Oral (10/14 0844) BP: 121/64 (10/14 1018) Pulse Rate: 61 (10/14 0844)  Labs:  Recent Labs  01/24/16 2122 01/25/16 0330 01/25/16 0736  HGB 12.9 12.8 12.5  HCT 39.9 39.5 39.4  PLT 163 160 155  CREATININE 1.69* 1.69* 1.73*  TROPONINI  --  0.05* 0.05*    Estimated Creatinine Clearance: 29.5 mL/min (by C-G formula based on SCr of 1.73 mg/dL (H)).   Medical History: Past Medical History:  Diagnosis Date  . ALLERGIC RHINITIS 12/09/2006  . DEGENERATIVE JOINT DISEASE, LEFT KNEE 01/23/2007  . Encephalocele ([place]) 01/23/2007  . FEVER UNSPECIFIED 01/28/2010  . GERD 12/09/2006  . GLUCOSE INTOLERANCE 01/24/2009  . GOUT 12/09/2006  . HYPERCHOLESTEROLEMIA 12/09/2006  . HYPERLIPIDEMIA 12/18/2006  . HYPERTENSION 12/09/2006  . Impaired glucose tolerance 07/26/2010  . MENOPAUSAL DISORDER 01/25/2008    Assessment: 27 yoF presenting w/ SOB and acute hypoxia now with VQ scan with high likelihood for PE. Previously on enoxaparin for DVT prophylaxis (last dose 10/14 ~1000), now to start on heparin. CBC stable and wnl. D-dimer significantly elevated, no S/Sx bleeding noted.  Goal of Therapy:  Heparin level 0.3-0.7 units/ml Monitor platelets by anticoagulation protocol: Yes   Plan:  -Discontinue enoxaparin -Heparin 4000 units x1 (low-end of nomogram due to enoxaparin dose this morning) -Heparin 1300 units/hr -Follow-up 8-hr anti-Xa level -Daily anti-Xa, CBC, S/Sx  bleeding  Arrie Senate, PharmD PGY-1 Pharmacy Resident Pager: [phone] 01/25/2016

## 2016-01-25 NOTE — ED Notes (Addendum)
Critical tropin 0.05 told to MD [facility]. No new orders

## 2016-01-25 NOTE — Progress Notes (Signed)
Received report on patient from [place], [place]. Doctor is in with patient now. Will be coming to floor shortly.

## 2016-01-25 NOTE — H&P (Signed)
History and Physical    Heather [name] [id number] DOB: 10/17/1928 DOA: 01/24/2016  PCP: [name], MD  Patient coming from: Home.  Chief Complaint: Shortness of breath.  HPI: Heather [name] is a 82 y.o. female with hypertension, peripheral edema, memory issues presents to the ER because of worsening shortness of breath. History was provided by patient's daughter. As per the daughter patient has been short of breath since night of October 12. Shortness of breath worsened yesterday morning. Did not have any chest pain or productive cough. In the ER chest x-ray shows possible congestion versus infiltrates. BNP was elevated at 400. In addition patient has been feeling weak and difficulty to ambulate. On exam patient was mildly short of breath but not in distress and appears nonfocal. Patient was given Lasix and antibiotics for CHF versus pneumonia and admitted for further management.   ED Course: Lasix 40 mg was given and ceftriaxone and Zithromax was started.  Review of Systems: As per HPI, rest all negative.   Past Medical History:  Diagnosis Date  . ALLERGIC RHINITIS 12/09/2006  . DEGENERATIVE JOINT DISEASE, LEFT KNEE 01/23/2007  . Encephalocele ([place]) 01/23/2007  . FEVER UNSPECIFIED 01/28/2010  . GERD 12/09/2006  . GLUCOSE INTOLERANCE 01/24/2009  . GOUT 12/09/2006  . HYPERCHOLESTEROLEMIA 12/09/2006  . HYPERLIPIDEMIA 12/18/2006  . HYPERTENSION 12/09/2006  . Impaired glucose tolerance 07/26/2010  . MENOPAUSAL DISORDER 01/25/2008    Past Surgical History:  Procedure Laterality Date  . s/p anterior encephalocele repair and craniotomy  2003     reports that she has never smoked. She has never used smokeless tobacco. She reports that she does not drink alcohol or use drugs.  Allergies  Allergen Reactions  . Lipitor [Atorvastatin] Other (See Comments)    myalgia  . Codeine Other (See Comments)  . Lovastatin Other (See Comments)  . Pravastatin Sodium Other (See Comments)     Family History  Problem Relation Age of Onset  . Hypertension Other     Prior to Admission medications   Medication Sig Start Date End Date Taking? Authorizing Provider  amLODipine (NORVASC) 5 MG tablet Take 1 tablet (5 mg total) by mouth daily. 01/01/16  Yes Biagio Borg, MD  aspirin 325 MG EC tablet Take 325 mg by mouth daily.     Yes Historical Provider, MD  benazepril (LOTENSIN) 40 MG tablet Take 1 tablet (40 mg total) by mouth daily. 01/01/16  Yes Biagio Borg, MD  carvedilol (COREG) 25 MG tablet Take 1 tablet (25 mg total) by mouth 2 (two) times daily with a meal. 01/01/16  Yes Biagio Borg, MD  furosemide (LASIX) 40 MG tablet Take 1 tablet (40 mg total) by mouth daily. 01/01/16  Yes Biagio Borg, MD  ibuprofen (ADVIL,MOTRIN) 200 MG tablet Take 400 mg by mouth every 6 (six) hours as needed for moderate pain.   Yes Historical Provider, MD    Physical Exam: Vitals:   01/24/16 2315 01/24/16 2345 01/25/16 0000 01/25/16 0015  BP: (!) 133/49 107/85 114/91 102/77  Pulse: (!) 57 (!) 58 (!) 58 (!) 53  Resp: 20  20   Temp:      TempSrc:      SpO2: 100% 100% 100% 100%  Weight:      Height:          Constitutional: Obese and not in distress. Vitals:   01/24/16 2315 01/24/16 2345 01/25/16 0000 01/25/16 0015  BP: (!) 133/49 107/85 114/91 102/77  Pulse: (!) 57 Marland Kitchen)  58 (!) 58 (!) 53  Resp: 20  20   Temp:      TempSrc:      SpO2: 100% 100% 100% 100%  Weight:      Height:       Eyes: Anicteric no pallor. ENMT: No discharge from the ears eyes nose or mouth. Neck: No obvious JVD no mass felt. No neck rigidity. Respiratory: No rhonchi or crepitations. Cardiovascular: S1 and S2 heard. No murmurs appreciated. Abdomen: Soft nontender bowel sounds present. No guarding or rigidity. Musculoskeletal: No edema. No joint effusions. Skin: No rash. Skin appears mildly warm. Neurologic: Alert awake oriented to time place and person and moves all extremities. Psychiatric: Appears normal  normal affect has mild difficulty in calling recent events.   Labs on Admission: I have personally reviewed following labs and imaging studies  CBC:  Recent Labs Lab 01/24/16 2122  WBC 8.3  NEUTROABS 5.8  HGB 12.9  HCT 39.9  MCV 88.5  PLT [id number]   Basic Metabolic Panel:  Recent Labs Lab 01/24/16 2122  NA 141  K 3.4*  CL 98*  CO2 29  GLUCOSE 174*  BUN 25*  CREATININE 1.69*  CALCIUM 9.9   GFR: Estimated Creatinine Clearance: 29.1 mL/min (by C-G formula based on SCr of 1.69 mg/dL (H)). Liver Function Tests: No results for input(s): AST, ALT, ALKPHOS, BILITOT, PROT, ALBUMIN in the last 168 hours. No results for input(s): LIPASE, AMYLASE in the last 168 hours. No results for input(s): AMMONIA in the last 168 hours. Coagulation Profile: No results for input(s): INR, PROTIME in the last 168 hours. Cardiac Enzymes: No results for input(s): CKTOTAL, CKMB, CKMBINDEX, TROPONINI in the last 168 hours. BNP (last 3 results) No results for input(s): PROBNP in the last 8760 hours. HbA1C: No results for input(s): HGBA1C in the last 72 hours. CBG: No results for input(s): GLUCAP in the last 168 hours. Lipid Profile: No results for input(s): CHOL, HDL, LDLCALC, TRIG, CHOLHDL, LDLDIRECT in the last 72 hours. Thyroid Function Tests: No results for input(s): TSH, T4TOTAL, FREET4, T3FREE, THYROIDAB in the last 72 hours. Anemia Panel: No results for input(s): VITAMINB12, FOLATE, FERRITIN, TIBC, IRON, RETICCTPCT in the last 72 hours. Urine analysis:    Component Value Date/Time   COLORURINE YELLOW 01/01/2016 [place] 01/01/2016 0958   LABSPEC 1.020 01/01/2016 0958   PHURINE 6.0 01/01/2016 0958   GLUCOSEU NEGATIVE 01/01/2016 0958   HGBUR NEGATIVE 01/01/2016 0958   BILIRUBINUR NEGATIVE 01/01/2016 0958   KETONESUR NEGATIVE 01/01/2016 0958   UROBILINOGEN 0.2 01/01/2016 0958   NITRITE NEGATIVE 01/01/2016 0958   LEUKOCYTESUR TRACE (A) 01/01/2016 0958   Sepsis  Labs: @LABRCNTIP (procalcitonin:4,lacticidven:4) )No results found for this or any previous visit (from the past 240 hour(s)).   Radiological Exams on Admission: Dg Chest 2 View  Result Date: 01/24/2016 CLINICAL DATA:  One week of shortness of breath EXAM: CHEST  2 VIEW COMPARISON:  None available FINDINGS: AP and lateral views of the chest. Low lung volumes. Eventration of right diaphragm. Hazy left upper lobe infiltrate. There is mild cardiomegaly with atherosclerosis of the aorta. Linear scar atelectasis in the right mid lung zone. Double density overlying the lower mediastinum, could relate to a large hiatal hernia. IMPRESSION: 1. Mild cardiomegaly. 2. Hazy opacity in the left lung could relate to mild infiltrate or asymmetric edema. 3. Oval opacity projects over lower mediastinum, this could relate to a large hiatal hernia. CT or swallow study could be obtained to confirm. 4. Atherosclerotic vascular  disease of the aorta. Electronically Signed   By: Donavan Foil M.D.   On: 01/24/2016 22:11    EKG: Independently reviewed. Normal sinus rhythm with QT prolongation at 505 ms.  Assessment/Plan Principal Problem:   Acute respiratory failure with hypoxia (HCC) Active Problems:   Hyperlipidemia   Essential hypertension   Memory dysfunction   General weakness   CHF (congestive heart failure) ([place])    1. Acute respiratory failure with hypoxia - differentials include CHF vs pneumonia. Patient has been placed on Lasix 40 mg IV daily and check 2-D echo cardiac markers and d-dimer. For possible pneumonia patient is on ceftriaxone and azithromycin. Check urine for Legionella and strep antigen. Closely follow intake and output and metabolic panel. 2. Hypertension - continue amlodipine and Coreg. We'll hold benazepril secondary to acute renal failure. Patient receiving Lasix. 3. Acute renal failure - check UA check FENa hold benazepril for now. Closely follow intake and output and metabolic  panel. 4. Generalized weakness - check TSH cardiac markers get physical therapy. Appears nonfocal. 5. History of memory issues - not on any medications at this time.   DVT prophylaxis: Lovenox. Code Status: Full code.  Family Communication: Patient's daughter.  Disposition Plan: Home.  Consults called: None.  Admission status: Inpatient and likely stay 2-3 days.    Rise Patience MD [facility] Pager [phone].  If 7PM-7AM, please contact night-coverage [email or web address] Password [email or web address]  01/25/2016, 12:46 AM

## 2016-01-25 NOTE — ED Notes (Signed)
Attempted report 

## 2016-01-26 ENCOUNTER — Inpatient Hospital Stay (HOSPITAL_COMMUNITY): Payer: Medicare Other

## 2016-01-26 DIAGNOSIS — I2782 Chronic pulmonary embolism: Secondary | ICD-10-CM

## 2016-01-26 DIAGNOSIS — I2699 Other pulmonary embolism without acute cor pulmonale: Secondary | ICD-10-CM

## 2016-01-26 DIAGNOSIS — I509 Heart failure, unspecified: Secondary | ICD-10-CM

## 2016-01-26 LAB — BASIC METABOLIC PANEL
Anion gap: 12 (ref 5–15)
BUN: 42 mg/dL — ABNORMAL HIGH (ref 6–20)
CO2: 27 mmol/L (ref 22–32)
Calcium: 9 mg/dL (ref 8.9–10.3)
Chloride: 100 mmol/L — ABNORMAL LOW (ref 101–111)
Creatinine, Ser: 2.14 mg/dL — ABNORMAL HIGH (ref 0.44–1.00)
GFR calc Af Amer: 23 mL/min — ABNORMAL LOW (ref >60)
GFR calc non Af Amer: 20 mL/min — ABNORMAL LOW (ref >60)
Glucose, Bld: 124 mg/dL — ABNORMAL HIGH (ref 65–99)
Potassium: 3.5 mmol/L (ref 3.5–5.1)
Sodium: 139 mmol/L (ref 135–145)

## 2016-01-26 LAB — CBC
HCT: 38 % (ref 36.0–46.0)
Hemoglobin: 12.2 g/dL (ref 12.0–15.0)
MCH: 28.1 pg (ref 26.0–34.0)
MCHC: 32.1 g/dL (ref 30.0–36.0)
MCV: 87.6 fL (ref 78.0–100.0)
Platelets: 174 10*3/uL (ref 150–400)
RBC: 4.34 MIL/uL (ref 3.87–5.11)
RDW: 15.4 % (ref 11.5–15.5)
WBC: 14.8 10*3/uL — ABNORMAL HIGH (ref 4.0–10.5)

## 2016-01-26 LAB — HEPARIN LEVEL (UNFRACTIONATED)
Heparin Unfractionated: 1.19 IU/mL — ABNORMAL HIGH (ref 0.30–0.70)
Heparin Unfractionated: 0.51 IU/mL (ref 0.30–0.70)

## 2016-01-26 LAB — ECHOCARDIOGRAM COMPLETE
Height: 64 in
Weight: 3776 oz

## 2016-01-26 MED ORDER — ALPRAZOLAM 0.25 MG PO TABS
0.2500 mg | ORAL_TABLET | Freq: Two times a day (BID) | ORAL | Status: DC | PRN
  Administered 2016-01-26 – 2016-01-28 (×4): 0.25 mg via ORAL
  Filled 2016-01-26 (×4): qty 1

## 2016-01-26 MED ORDER — SODIUM CHLORIDE 0.9 % IV SOLN
INTRAVENOUS | Status: AC
  Administered 2016-01-26: 19:00:00 via INTRAVENOUS
  Administered 2016-01-26: 100 mL/h via INTRAVENOUS

## 2016-01-26 MED ORDER — POLYETHYLENE GLYCOL 3350 17 G PO PACK
17.0000 g | PACK | Freq: Every day | ORAL | Status: DC
  Administered 2016-01-26 – 2016-01-27 (×2): 17 g via ORAL
  Filled 2016-01-26 (×2): qty 1

## 2016-01-26 MED ORDER — HEPARIN (PORCINE) IN NACL 100-0.45 UNIT/ML-% IJ SOLN
700.0000 [IU]/h | INTRAMUSCULAR | Status: DC
  Administered 2016-01-26: 700 [IU]/h via INTRAVENOUS
  Filled 2016-01-26 (×2): qty 250

## 2016-01-26 NOTE — Progress Notes (Addendum)
PROGRESS NOTE                                                                                                                                                                                                             Patient Demographics:    Heather [name], is a 82 y.o. female, DOB - 11-16-1928, [id number]  Admit date - 01/24/2016   Admitting Physician Rise Patience, MD  Outpatient Primary MD for the patient is [name], MD  LOS - 2  Outpatient Specialists:None   Chief Complaint  Patient presents with  . Shortness of Breath       Brief Narrative   82 year old female with hypertension, peripheral edema, mild dementia presented to the ER with worsening shortness of breath for past few days worsened on the day of admission. She denied any chest pain or productive cough. In the ED vitals were stable with chest x-ray showing possible congestion versus infiltrate. BNP was elevated to 400. Patient also reported generalized weakness with difficulty ambulating. Patient admitted for possible CHF versus pneumonia. D-dimer was found to be significantly elevated. A CT scan done showed high probability for PE.    Subjective:   Breathing unchanged. Still has some discomfort in her left leg.  Assessment  & Plan :    Principal Problem:   Acute respiratory failure with hypoxia (HCC) Suspect due to his acute PE. VQ scan with high probability for PE Started on IV heparin, switched to NOAC once renal function improves. -Follow 2-D echo and Doppler lower extremities. - O2 via nasal cannula. Wheezing improved with nebs. Pneumonia seems unlikely. Antibiotic discontinued.   Active Problems: Acute kidney injury (HCC) Hold ARB and Lasix. Further worsened in a.m. labs. Check urine lites. Will resume IV fluids.  Hypokalemia Replenished  Essential hypertension   continue amlodipine   Generalized weakness PT  evaluation      Code Status : Full code  Family Communication  : Daughter at bedside  Disposition Plan  : Home once dyspnea and renal function improved  Barriers For Discharge : Active symptoms  Consults  : None  Procedures  :  VQ scan  DVT Prophylaxis  :  IV heparin  Lab Results  Component Value Date   PLT 174 01/26/2016    Antibiotics  :    Anti-infectives    Start  Dose/Rate Route Frequency Ordered Stop   01/25/16 2200  cefTRIAXone (ROCEPHIN) 1 g in dextrose 5 % 50 mL IVPB  Status:  Discontinued     1 g 100 mL/hr over 30 Minutes Intravenous Every 24 hours 01/25/16 0044 01/25/16 1409   01/25/16 2200  azithromycin (ZITHROMAX) 500 mg in dextrose 5 % 250 mL IVPB  Status:  Discontinued     500 mg 250 mL/hr over 60 Minutes Intravenous Every 24 hours 01/25/16 0044 01/25/16 1409   01/24/16 2245  cefTRIAXone (ROCEPHIN) 1 g in dextrose 5 % 50 mL IVPB     1 g 100 mL/hr over 30 Minutes Intravenous  Once 01/24/16 2244 01/24/16 2345   01/24/16 2245  azithromycin (ZITHROMAX) 500 mg in dextrose 5 % 250 mL IVPB     500 mg 250 mL/hr over 60 Minutes Intravenous  Once 01/24/16 2244 01/25/16 0132        Objective:   Vitals:   01/25/16 1800 01/25/16 2037 01/25/16 2354 01/26/16 0339  BP: (!) 151/88 (!) 140/91 (!) 131/54 (!) 159/84  Pulse: 63 60 (!) 57 (!) 54  Resp: (!) 25 (!) 28 (!) 25 20  Temp:  97.5 F (36.4 C) 98.4 F (36.9 C) 98.3 F (36.8 C)  TempSrc:      SpO2: 95% 100% 92% 98%  Weight:    107 kg (236 lb)  Height:        Wt Readings from Last 3 Encounters:  01/26/16 107 kg (236 lb)  01/01/16 116.1 kg (256 lb)  08/01/14 115.2 kg (254 lb 0.6 oz)     Intake/Output Summary (Last 24 hours) at 01/26/16 1127 Last data filed at 01/26/16 0341  Gross per 24 hour  Intake           264.72 ml  Output              200 ml  Net            64.72 ml     Physical Exam  Gen: not in distress HEENT: West mucosa, supple neck Chest: Clear breath sounds bilaterally CVS:  N S1&S2, no murmurs, rubs or gallop GI: soft, NT, ND, BS+ Musculoskeletal: warm, trace edema,Lt leg>Rt leg     Data Review:    CBC  Recent Labs Lab 01/24/16 2122 01/25/16 0330 01/25/16 0736 01/26/16 0748  WBC 8.3 8.3 8.2 14.8*  HGB 12.9 12.8 12.5 12.2  HCT 39.9 39.5 39.4 38.0  PLT 163 160 155 174  MCV 88.5 87.4 88.3 87.6  MCH 28.6 28.3 28.0 28.1  MCHC 32.3 32.4 31.7 32.1  RDW 15.9* 15.5 15.6* 15.4  LYMPHSABS 1.5  --  1.1  --   MONOABS 0.9  --  0.3  --   EOSABS 0.1  --  0.0  --   BASOSABS 0.0  --  0.0  --     Chemistries   Recent Labs Lab 01/24/16 2122 01/25/16 0330 01/25/16 0736 01/26/16 0748  NA 141  --  140 139  K 3.4*  --  3.8 3.5  CL 98*  --  99* 100*  CO2 29  --  28 27  GLUCOSE 174*  --  149* 124*  BUN 25*  --  30* 42*  CREATININE 1.69* 1.69* 1.73* 2.14*  CALCIUM 9.9  --  9.7 9.0  AST  --   --  23  --   ALT  --   --  17  --   ALKPHOS  --   --  62  --   BILITOT  --   --  1.0  --    ------------------------------------------------------------------------------------------------------------------ No results for input(s): CHOL, HDL, LDLCALC, TRIG, CHOLHDL, LDLDIRECT in the last 72 hours.  Lab Results  Component Value Date   HGBA1C 6.2 01/01/2016   ------------------------------------------------------------------------------------------------------------------  Recent Labs  01/25/16 0042  TSH 1.485   ------------------------------------------------------------------------------------------------------------------ No results for input(s): VITAMINB12, FOLATE, FERRITIN, TIBC, IRON, RETICCTPCT in the last 72 hours.  Coagulation profile No results for input(s): INR, PROTIME in the last 168 hours.   Recent Labs  01/24/16 0110  DDIMER >20.00*    Cardiac Enzymes  Recent Labs Lab 01/25/16 0330 01/25/16 0736 01/25/16 1356  TROPONINI 0.05* 0.05* 0.04*    ------------------------------------------------------------------------------------------------------------------    Component Value Date/Time   BNP 429.5 (H) 01/24/2016 2122    Inpatient Medications  Scheduled Meds: . amLODipine  5 mg Oral Daily  . aspirin  325 mg Oral Daily  . carvedilol  25 mg Oral BID WC  . polyethylene glycol  17 g Oral Daily  . potassium chloride  10 mEq Oral Daily  . sodium chloride flush  3 mL Intravenous Q12H   Continuous Infusions: . sodium chloride 100 mL/hr (01/26/16 0924)  . heparin 700 Units/hr (01/26/16 1038)   PRN Meds:.acetaminophen **OR** acetaminophen, albuterol, ondansetron **OR** ondansetron (ZOFRAN) IV, technetium TC 21M diethylenetriame-pentaacetic acid  Micro Results Recent Results (from the past 240 hour(s))  Culture, sputum-assessment     Status: None   Collection Time: 01/25/16  8:28 AM  Result Value Ref Range Status   Specimen Description SPUTUM  Final   Special Requests NONE  Final   Sputum evaluation   Final    THIS SPECIMEN IS ACCEPTABLE. RESPIRATORY CULTURE REPORT TO FOLLOW.   Report Status 01/25/2016 FINAL  Final  Culture, respiratory (NON-Expectorated)     Status: None (Preliminary result)   Collection Time: 01/25/16  8:28 AM  Result Value Ref Range Status   Specimen Description SPUTUM  Final   Special Requests NONE  Final   Gram Stain   Final    FEW WBC PRESENT,BOTH PMN AND MONONUCLEAR RARE SQUAMOUS EPITHELIAL CELLS PRESENT RARE GRAM POSITIVE COCCI IN PAIRS RARE GRAM VARIABLE ROD    Culture PENDING  Incomplete   Report Status PENDING  Incomplete    Radiology Reports Dg Chest 2 View  Result Date: 01/24/2016 CLINICAL DATA:  One week of shortness of breath EXAM: CHEST  2 VIEW COMPARISON:  None available FINDINGS: AP and lateral views of the chest. Low lung volumes. Eventration of right diaphragm. Hazy left upper lobe infiltrate. There is mild cardiomegaly with atherosclerosis of the aorta. Linear scar atelectasis  in the right mid lung zone. Double density overlying the lower mediastinum, could relate to a large hiatal hernia. IMPRESSION: 1. Mild cardiomegaly. 2. Hazy opacity in the left lung could relate to mild infiltrate or asymmetric edema. 3. Oval opacity projects over lower mediastinum, this could relate to a large hiatal hernia. CT or swallow study could be obtained to confirm. 4. Atherosclerotic vascular disease of the aorta. Electronically Signed   By: Donavan Foil M.D.   On: 01/24/2016 22:11   Nm Pulmonary Perf And Vent  Result Date: 01/25/2016 CLINICAL DATA:  Hypoxia. EXAM: NUCLEAR MEDICINE VENTILATION - PERFUSION LUNG SCAN TECHNIQUE: Ventilation images were obtained in multiple projections using inhaled aerosol Tc-63m DTPA. Perfusion images were obtained in multiple projections after intravenous injection of Tc-55m MAA. RADIOPHARMACEUTICALS:  31.4 mCi Technetium-20m DTPA aerosol inhalation and 4.3 mCi Technetium-46m MAA  IV COMPARISON:  Chest x-ray from yesterday FINDINGS: There are multiple matched ventilation and perfusion defects. There are at least 2 large wedge-shaped defects on the right which are more prominent on perfusion than ventilation imaging. IMPRESSION: High probability V/Q scan. The multiple matched defects suggest underlying airways disease as well. Findings called to the patient's nurse, Caesar. He will inform the referring physician. Electronically Signed   By: Dorise Bullion III M.D   On: 01/25/2016 13:47    Time Spent in minutes  35   Louellen Molder M.D on 01/26/2016 at 11:27 AM  Between 7am to 7pm - Pager - [phone]  After 7pm go to [email or web address] - password [facility]  [facility] -  Office  [phone]

## 2016-01-26 NOTE — Progress Notes (Signed)
ANTICOAGULATION CONSULT NOTE - [place] for heparin Indication: pulmonary embolus  Allergies  Allergen Reactions  . Lipitor [Atorvastatin] Other (See Comments)    myalgia  . Codeine Other (See Comments)  . Lovastatin Other (See Comments)  . Pravastatin Sodium Other (See Comments)    Patient Measurements: Height: 5\' 4"  (162.6 cm) Weight: 236 lb (107 kg) IBW/kg (Calculated) : 54.7 Heparin Dosing Weight: 82 kg  Vital Signs:    Labs:  Recent Labs  01/25/16 0330 01/25/16 0736 01/25/16 1356 01/25/16 2258 01/26/16 0748 01/26/16 1830  HGB 12.8 12.5  --   --  12.2  --   HCT 39.5 39.4  --   --  38.0  --   PLT 160 155  --   --  174  --   HEPARINUNFRC  --   --   --  1.22* 1.19* 0.51  CREATININE 1.69* 1.73*  --   --  2.14*  --   TROPONINI 0.05* 0.05* 0.04*  --   --   --     Estimated Creatinine Clearance: 22.9 mL/min (by C-G formula based on SCr of 2.14 mg/dL (H)).   Medical History: Past Medical History:  Diagnosis Date  . ALLERGIC RHINITIS 12/09/2006  . DEGENERATIVE JOINT DISEASE, LEFT KNEE 01/23/2007  . Encephalocele ([place]) 01/23/2007  . FEVER UNSPECIFIED 01/28/2010  . GERD 12/09/2006  . GLUCOSE INTOLERANCE 01/24/2009  . GOUT 12/09/2006  . HYPERCHOLESTEROLEMIA 12/09/2006  . HYPERLIPIDEMIA 12/18/2006  . HYPERTENSION 12/09/2006  . Impaired glucose tolerance 07/26/2010  . MENOPAUSAL DISORDER 01/25/2008    Assessment:  Enox for DVT ppx > VQ scan high probability of PE so switch to heparin. D-dimer >20. HL 0.51 now in goal.  Goal of Therapy:  Heparin level 0.3-0.7 units/ml Monitor platelets by anticoagulation protocol: Yes   Plan:  Continue IV heparin at 700 units/hr Will confirm level in 6 hrs.  Crystal S. Alford Highland, PharmD, [facility] Clinical Staff Pharmacist Pager [phone]  01/26/2016

## 2016-01-26 NOTE — Progress Notes (Signed)
ANTICOAGULATION CONSULT NOTE - [place] for heparin Indication: pulmonary embolus  Allergies  Allergen Reactions  . Lipitor [Atorvastatin] Other (See Comments)    myalgia  . Codeine Other (See Comments)  . Lovastatin Other (See Comments)  . Pravastatin Sodium Other (See Comments)    Patient Measurements: Height: 5\' 4"  (162.6 cm) Weight: 236 lb (107 kg) IBW/kg (Calculated) : 54.7 Heparin Dosing Weight: 82 kg  Vital Signs: Temp: 98.3 F (36.8 C) (10/15 0339) BP: 159/84 (10/15 0339) Pulse Rate: 54 (10/15 0339)  Labs:  Recent Labs  01/25/16 0330 01/25/16 0736 01/25/16 1356 01/25/16 2258 01/26/16 0748  HGB 12.8 12.5  --   --  12.2  HCT 39.5 39.4  --   --  38.0  PLT 160 155  --   --  174  HEPARINUNFRC  --   --   --  1.22* 1.19*  CREATININE 1.69* 1.73*  --   --  2.14*  TROPONINI 0.05* 0.05* 0.04*  --   --     Estimated Creatinine Clearance: 22.9 mL/min (by C-G formula based on SCr of 2.14 mg/dL (H)).   Medical History: Past Medical History:  Diagnosis Date  . ALLERGIC RHINITIS 12/09/2006  . DEGENERATIVE JOINT DISEASE, LEFT KNEE 01/23/2007  . Encephalocele ([place]) 01/23/2007  . FEVER UNSPECIFIED 01/28/2010  . GERD 12/09/2006  . GLUCOSE INTOLERANCE 01/24/2009  . GOUT 12/09/2006  . HYPERCHOLESTEROLEMIA 12/09/2006  . HYPERLIPIDEMIA 12/18/2006  . HYPERTENSION 12/09/2006  . Impaired glucose tolerance 07/26/2010  . MENOPAUSAL DISORDER 01/25/2008    Assessment: 18 yoF presenting w/ SOB and acute hypoxia now with elevated D-dimer and VQ scan with high likelihood for PE. Previously on enoxaparin for DVT prophylaxis (last dose 10/14 ~1000), transitioned to heparin infusion. Supratherapeutic overnight and remains supratherapeutic this morning despite dose reduction. CBC stable and wnl. Per RN, pt has had ongoing hemoptysis for a few days but no new S/Sx bleeding.  Goal of Therapy:  Heparin level 0.3-0.7 units/ml Monitor platelets by anticoagulation  protocol: Yes   Plan:  -Hold heparin x1 hour -Restart heparin at 700 units/hr at 1030 -Follow-up 8-hr anti-Xa level at 1830 -Daily anti-Xa, CBC, S/Sx bleeding  Arrie Senate, PharmD PGY-1 Pharmacy Resident Pager: [phone] 01/26/2016

## 2016-01-26 NOTE — Evaluation (Signed)
Physical Therapy Evaluation Patient Details Name: Heather [name] MRN: [id number] DOB: Aug 18, 1928 Today's Date: 01/26/2016   History of Present Illness  Patient is a 82 yo female admitted 01/24/16 with acute resp failure with hypoxia.  VQ scan - PE.  Patient also with AKI and weakness.  PMH:  HTN, peripheral edema, mild dementia  Clinical Impression  Patient presents with problems listed below.  Will benefit from acute PT to maximize functional mobility prior to discharge.  Patient will need to function at min assist to min guard assist level to return home with family.  Recommend HHPT for continued therapy at d/c.   If unable to reach this level, may need to consider SNF.    Follow Up Recommendations Home health PT;Supervision/Assistance - 24 hour    Equipment Recommendations  Wheelchair (measurements PT);Wheelchair cushion (measurements PT)    Recommendations for Other Services       Precautions / Restrictions Precautions Precautions: Fall Restrictions Weight Bearing Restrictions: No      Mobility  Bed Mobility Overal bed mobility: Needs Assistance Bed Mobility: Supine to Sit;Sit to Supine     Supine to sit: Mod assist;HOB elevated Sit to supine: Max assist;+2 for physical assistance   General bed mobility comments: Patient able to move toward EOB.  Assist to scoot forward in sitting to EOB.  Good sitting balance.  Patient required +2 max assist to control trunk and bring LE's onto bed to return to supine.  Transfers Overall transfer level: Needs assistance Equipment used: 2 person hand held assist Transfers: Sit to/from [facility] Sit to Stand: Mod assist;+2 physical assistance Stand pivot transfers: Mod assist;Max assist;+2 physical assistance       General transfer comment: Verbal cues for hand placement.  Patient required assist to rise to partial standing - trunk flexed forward.  Able to stand for pericare following BM.  Patient transferred bed <>  BSC with +2 mod to max assist.  Maintained flexed trunk.  Rt knee remained in extension to support patient in stance, but then unable to move for transfer.  Required max assist to complete pivot with hips.  Ambulation/Gait             General Gait Details: NT  Stairs            Wheelchair Mobility    Modified Rankin (Stroke Patients Only)       Balance Overall balance assessment: Needs assistance Sitting-balance support: No upper extremity supported;Feet supported Sitting balance-Leahy Scale: Fair     Standing balance support: Bilateral upper extremity supported Standing balance-Leahy Scale: Poor                               Pertinent Vitals/Pain Pain Assessment: Faces Faces Pain Scale: Hurts even more Pain Location: Rt knee - chronic Pain Descriptors / Indicators: Aching;Sore Pain Intervention(s): Limited activity within patient's tolerance;Monitored during session;Repositioned    Home Living Family/patient expects to be discharged to:: Private residence Living Arrangements: Children;Other relatives (Husband, 2 daughters, son-in-law) Available Help at Discharge: Family;Available 24 hours/day Type of Home: House Home Access: Stairs to enter   [employer] of Steps: 1 Home Layout: One level Home Equipment: Walker - 2 wheels;Bedside commode      Prior Function Level of Independence: Needs assistance   Gait / Transfers Assistance Needed: Ambulates with RW with min guard assist.  Daughter reports minimal ambulation during day.  ADL's / Homemaking Assistance Needed: Assist with  bathing, meal prep, housekeeping.  Patient able to assist with dressing        Hand Dominance        Extremity/Trunk Assessment   Upper Extremity Assessment: Generalized weakness           Lower Extremity Assessment: RLE deficits/detail;LLE deficits/detail RLE Deficits / Details: Strength grossly 3+/5; Pain Rt knee per patient due to OA - limits  strength and ROM LLE Deficits / Details: Strength grossly 4-/5     Communication   Communication: No difficulties  Cognition Arousal/Alertness: Awake/alert Behavior During Therapy: Flat affect;Anxious Overall Cognitive Status: History of cognitive impairments - at baseline (Difficulty following commands and seqencing)       Memory: Decreased short-term memory              General Comments      Exercises     Assessment/Plan    PT Assessment Patient needs continued PT services  PT Problem List Decreased strength;Decreased activity tolerance;Decreased balance;Decreased mobility;Decreased cognition;Decreased knowledge of use of DME;Cardiopulmonary status limiting activity;Obesity;Pain          PT Treatment Interventions DME instruction;Gait training;Functional mobility training;Therapeutic activities;Therapeutic exercise;Patient/family education    PT Goals (Current goals can be found in the Care Plan section)  Acute Rehab PT Goals Patient Stated Goal: None stated PT Goal Formulation: With patient/family Time For Goal Achievement: 02/02/16 Potential to Achieve Goals: Good    Frequency Min 3X/week   Barriers to discharge        Co-evaluation               End of Session Equipment Utilized During Treatment: Gait belt;Oxygen Activity Tolerance: Patient limited by pain;Patient limited by fatigue Patient left: in bed;with call bell/phone within reach;with family/visitor present;with nursing/sitter in room Nurse Communication: Mobility status         Time: [id number] PT Time Calculation (min) (ACUTE ONLY): 32 min   Charges:   PT Evaluation $PT Eval Moderate Complexity: 1 Procedure PT Treatments $Therapeutic Activity: 8-22 mins   PT G Codes:        Despina Pole 02/16/2016, 9:30 PM Carita Pian. Sanjuana Kava, [place] Pager [phone]

## 2016-01-26 NOTE — Progress Notes (Signed)
  Echocardiogram 2D Echocardiogram has been performed.  Jennette Dubin 01/26/2016, 2:18 PM

## 2016-01-26 NOTE — Progress Notes (Addendum)
VASCULAR LAB PRELIMINARY  PRELIMINARY  PRELIMINARY  PRELIMINARY  Bilateral lower extremity venous duplex completed.    Preliminary report:  Findings consistent with acute deep vein thrombosis involving the femoral, popliteal, and posterior tibial veins of the right lower extremity and the popliteal, posterior tibial, and peroneal veins of the left lower extremity.    Called results to Remy, Therapist, sports.  KANADY, CANDACE, RVT 01/26/2016, 2:17 PM

## 2016-01-27 DIAGNOSIS — R531 Weakness: Secondary | ICD-10-CM

## 2016-01-27 DIAGNOSIS — I82403 Acute embolism and thrombosis of unspecified deep veins of lower extremity, bilateral: Secondary | ICD-10-CM

## 2016-01-27 LAB — URINALYSIS, ROUTINE W REFLEX MICROSCOPIC
Bilirubin Urine: NEGATIVE
Glucose, UA: NEGATIVE mg/dL
Hgb urine dipstick: NEGATIVE
Ketones, ur: NEGATIVE mg/dL
Leukocytes, UA: NEGATIVE
Nitrite: NEGATIVE
Protein, ur: NEGATIVE mg/dL
Specific Gravity, Urine: 1.016 (ref 1.005–1.030)
pH: 5 (ref 5.0–8.0)

## 2016-01-27 LAB — BASIC METABOLIC PANEL
Anion gap: 10 (ref 5–15)
BUN: 47 mg/dL — ABNORMAL HIGH (ref 6–20)
CO2: 28 mmol/L (ref 22–32)
Calcium: 8.4 mg/dL — ABNORMAL LOW (ref 8.9–10.3)
Chloride: 101 mmol/L (ref 101–111)
Creatinine, Ser: 2.06 mg/dL — ABNORMAL HIGH (ref 0.44–1.00)
GFR calc Af Amer: 24 mL/min — ABNORMAL LOW (ref >60)
GFR calc non Af Amer: 21 mL/min — ABNORMAL LOW (ref >60)
Glucose, Bld: 114 mg/dL — ABNORMAL HIGH (ref 65–99)
Potassium: 3.9 mmol/L (ref 3.5–5.1)
Sodium: 139 mmol/L (ref 135–145)

## 2016-01-27 LAB — CBC
HCT: 37.5 % (ref 36.0–46.0)
Hemoglobin: 11.5 g/dL — ABNORMAL LOW (ref 12.0–15.0)
MCH: 27.4 pg (ref 26.0–34.0)
MCHC: 30.7 g/dL (ref 30.0–36.0)
MCV: 89.3 fL (ref 78.0–100.0)
Platelets: 152 10*3/uL (ref 150–400)
RBC: 4.2 MIL/uL (ref 3.87–5.11)
RDW: 15.9 % — ABNORMAL HIGH (ref 11.5–15.5)
WBC: 9.8 10*3/uL (ref 4.0–10.5)

## 2016-01-27 LAB — CULTURE, RESPIRATORY W GRAM STAIN: Culture: NORMAL

## 2016-01-27 LAB — CREATININE, URINE, RANDOM: Creatinine, Urine: 131.45 mg/dL

## 2016-01-27 LAB — SODIUM, URINE, RANDOM: Sodium, Ur: 31 mmol/L

## 2016-01-27 LAB — HEPARIN LEVEL (UNFRACTIONATED): Heparin Unfractionated: 0.45 IU/mL (ref 0.30–0.70)

## 2016-01-27 MED ORDER — ALBUTEROL SULFATE (2.5 MG/3ML) 0.083% IN NEBU
2.5000 mg | INHALATION_SOLUTION | Freq: Three times a day (TID) | RESPIRATORY_TRACT | Status: DC
  Administered 2016-01-27 – 2016-01-28 (×3): 2.5 mg via RESPIRATORY_TRACT
  Filled 2016-01-27 (×3): qty 3

## 2016-01-27 NOTE — Progress Notes (Signed)
Physical Therapy Treatment Patient Details Name: Heather [name] MRN: [id number] DOB: May 10, 1928 Today's Date: 01/27/2016    History of Present Illness Patient is an 82 yo female admitted 01/24/16 with acute resp failure with hypoxia.  VQ scan - PE.  Patient also with AKI and weakness.  PMH:  HTN, peripheral edema, mild dementia    PT Comments    Patient seen for OOB to chair and mobility progression. At this time, patient continues to require +2 increased physical assist for basic mobility. Patient with desaturation on 2 liters to 88%, increased to 3 liters with activity. Patient tolerated transfer x3 with 2 person assist. Nsg called to room for bleeding noted in right IV site. At this time, continue to recommend wheel chair for discharge, will continue current POC.  Follow Up Recommendations  Home health PT;Supervision/Assistance - 24 hour     Equipment Recommendations  Wheelchair (measurements PT);Wheelchair cushion (measurements PT)    Recommendations for Other Services       Precautions / Restrictions Precautions Precautions: Fall Restrictions Weight Bearing Restrictions: No    Mobility  Bed Mobility Overal bed mobility: Needs Assistance Bed Mobility: Rolling;Supine to Sit Rolling: Mod assist (rolling for pericare in bed)   Supine to sit: Mod assist;HOB elevated     General bed mobility comments: Increased assist to come to EOB, 2 person assist to elevate to upright use of chuck pad to scoot to EOB  Transfers Overall transfer level: Needs assistance Equipment used: Rolling walker (2 wheeled);2 person hand held assist (use of RW to chair 1st transfer, then HHA from chair <> BSC) Transfers: Sit to/from Stand Sit to Stand: Mod assist;+2 physical assistance Stand pivot transfers: Mod assist;Max assist;+2 physical assistance       General transfer comment: VCs for hand placement.  Pt required increased assist to rise to standing - trunk flexed forward. Pt transferred  from bed to chair then chair to [facility] with +2 mod to max assist.  Maintained flexed trunk.  Manual assist to rotate hips around to commode.  Required max assist to complete pivot with hips when returning to chair.  Ambulation/Gait             General Gait Details: NT   Stairs            Wheelchair Mobility    Modified Rankin (Stroke Patients Only)       Balance   Sitting-balance support: No upper extremity supported Sitting balance-Leahy Scale: Fair     Standing balance support: Bilateral upper extremity supported Standing balance-Leahy Scale: Poor                      Cognition Arousal/Alertness: Awake/alert Behavior During Therapy: Flat affect;Anxious Overall Cognitive Status: History of cognitive impairments - at baseline (Difficulty following commands and seqencing)       Memory: Decreased short-term memory              Exercises      General Comments        Pertinent Vitals/Pain Pain Assessment: Faces Faces Pain Scale: Hurts little more Pain Location: right knee Pain Descriptors / Indicators: Aching Pain Intervention(s): Limited activity within patient's tolerance    Home Living                      Prior Function Level of Independence: Needs assistance  Gait / Transfers Assistance Needed: Ambulates with RW with min guard assist.  Daughter reports minimal ambulation during  day. ADL's / Homemaking Assistance Needed: Assist with bathing, meal prep, housekeeping.  Patient able to assist with dressing     PT Goals (current goals can now be found in the care plan section) Acute Rehab PT Goals Patient Stated Goal: None stated PT Goal Formulation: With patient/family Time For Goal Achievement: 02/02/16 Potential to Achieve Goals: Good Progress towards PT goals: Progressing toward goals    Frequency    Min 3X/week      PT Plan Current plan remains appropriate    Co-evaluation             End of Session  Equipment Utilized During Treatment: Gait belt;Oxygen Activity Tolerance: Patient limited by pain;Patient limited by fatigue Patient left: in chair;with call bell/phone within reach;with chair alarm set;with family/visitor present     Time: [id number] PT Time Calculation (min) (ACUTE ONLY): 26 min  Charges:  $Therapeutic Activity: 23-37 mins                    G CodesDuncan Dull February 20, 2016, 2:18 PM Alben Deeds, [place] DPT  [phone]

## 2016-01-27 NOTE — Progress Notes (Signed)
PROGRESS NOTE                                                                                                                                                                                                             Patient Demographics:    Heather [name], is a 82 y.o. female, DOB - [birth year], [id number]  Admit date - 01/24/2016   Admitting Physician Rise Patience, MD  Outpatient Primary MD for the patient is [name], MD  LOS - 3  Outpatient Specialists:None   Chief Complaint  Patient presents with  . Shortness of Breath       Brief Narrative   82 year old female with hypertension, peripheral edema, mild dementia presented to the ER with worsening shortness of breath for past few days worsened on the day of admission. She denied any chest pain or productive cough. In the ED vitals were stable with chest x-ray showing possible congestion versus infiltrate. BNP was elevated to 400. Patient also reported generalized weakness with difficulty ambulating. Patient admitted for possible CHF versus pneumonia. However her d-dimer was significantly elevated and VQ scan done showed hypermobility for PE. Doppler lower images showed bilateral DVT.    Subjective:   Still reports some pain in her left leg. Dyspnea better  Assessment  & Plan :    Principal Problem:   Acute respiratory failure with hypoxia (HCC) Suspect due to his acute PE. VQ scan with high probability for PE, bilateral leg DVT on Doppler. - IV heparin, switched to NOAC once renal function improves. -2-D echo with normal EF, some diastolic dysfunction but no wall motion abnormality. Shows moderately increased pulmonary artery pressure. - O2 via nasal cannula. Wheezing improved with nebs. -Antibiotic discontinued.   Active Problems: Acute kidney injury (HCC) Prerenal (FeNa 0.4) Hold ARB and Lasix. Continue  fluids.   Hypokalemia Replenished  Essential hypertension   continue amlodipine   Generalized weakness PT evaluation      Code Status : Full code  Family Communication  : Daughter at bedside  Disposition Plan  : Home once dyspnea and renal function improved  Barriers For Discharge : Active symptoms  Consults  : None  Procedures  :  VQ scan 2-D echo Doppler lower extremities  DVT Prophylaxis  :  IV heparin  Lab Results  Component Value Date   PLT 152  01/27/2016    Antibiotics  :    Anti-infectives    Start     Dose/Rate Route Frequency Ordered Stop   01/25/16 2200  cefTRIAXone (ROCEPHIN) 1 g in dextrose 5 % 50 mL IVPB  Status:  Discontinued     1 g 100 mL/hr over 30 Minutes Intravenous Every 24 hours 01/25/16 0044 01/25/16 1409   01/25/16 2200  azithromycin (ZITHROMAX) 500 mg in dextrose 5 % 250 mL IVPB  Status:  Discontinued     500 mg 250 mL/hr over 60 Minutes Intravenous Every 24 hours 01/25/16 0044 01/25/16 1409   01/24/16 2245  cefTRIAXone (ROCEPHIN) 1 g in dextrose 5 % 50 mL IVPB     1 g 100 mL/hr over 30 Minutes Intravenous  Once 01/24/16 2244 01/24/16 2345   01/24/16 2245  azithromycin (ZITHROMAX) 500 mg in dextrose 5 % 250 mL IVPB     500 mg 250 mL/hr over 60 Minutes Intravenous  Once 01/24/16 2244 01/25/16 0132        Objective:   Vitals:   01/27/16 0500 01/27/16 0612 01/27/16 1002 01/27/16 1027  BP:  (!) 133/52 (!) 132/59   Pulse: (!) 44 (!) 49    Resp: 17 20    Temp:  97.5 F (36.4 C)    TempSrc:      SpO2: 100% 99%  95%  Weight:      Height:        Wt Readings from Last 3 Encounters:  01/27/16 111.8 kg (246 lb 6.4 oz)  01/01/16 116.1 kg (256 lb)  08/01/14 115.2 kg (254 lb 0.6 oz)     Intake/Output Summary (Last 24 hours) at 01/27/16 1126 Last data filed at 01/27/16 0903  Gross per 24 hour  Intake          2353.86 ml  Output              650 ml  Net          1703.86 ml     Physical Exam  Gen: not in distress HEENT:  Moist mucosa, supple neck Chest: Clear breath sounds bilaterally CVS: N S1&S2, no murmurs, rubs or gallop GI: soft, NT, ND,  Musculoskeletal: warm, Lt leg>Rt leg     Data Review:    CBC  Recent Labs Lab 01/24/16 2122 01/25/16 0330 01/25/16 0736 01/26/16 0748 01/27/16 0022  WBC 8.3 8.3 8.2 14.8* 9.8  HGB 12.9 12.8 12.5 12.2 11.5*  HCT 39.9 39.5 39.4 38.0 37.5  PLT 163 160 155 174 152  MCV 88.5 87.4 88.3 87.6 89.3  MCH 28.6 28.3 28.0 28.1 27.4  MCHC 32.3 32.4 31.7 32.1 30.7  RDW 15.9* 15.5 15.6* 15.4 15.9*  LYMPHSABS 1.5  --  1.1  --   --   MONOABS 0.9  --  0.3  --   --   EOSABS 0.1  --  0.0  --   --   BASOSABS 0.0  --  0.0  --   --     Chemistries   Recent Labs Lab 01/24/16 2122 01/25/16 0330 01/25/16 0736 01/26/16 0748 01/27/16 0022  NA 141  --  140 139 139  K 3.4*  --  3.8 3.5 3.9  CL 98*  --  99* 100* 101  CO2 29  --  28 27 28   GLUCOSE 174*  --  149* 124* 114*  BUN 25*  --  30* 42* 47*  CREATININE 1.69* 1.69* 1.73* 2.14* 2.06*  CALCIUM 9.9  --  9.7 9.0 8.4*  AST  --   --  23  --   --   ALT  --   --  17  --   --   ALKPHOS  --   --  62  --   --   BILITOT  --   --  1.0  --   --    ------------------------------------------------------------------------------------------------------------------ No results for input(s): CHOL, HDL, LDLCALC, TRIG, CHOLHDL, LDLDIRECT in the last 72 hours.  Lab Results  Component Value Date   HGBA1C 6.2 01/01/2016   ------------------------------------------------------------------------------------------------------------------  Recent Labs  01/25/16 0042  TSH 1.485   ------------------------------------------------------------------------------------------------------------------ No results for input(s): VITAMINB12, FOLATE, FERRITIN, TIBC, IRON, RETICCTPCT in the last 72 hours.  Coagulation profile No results for input(s): INR, PROTIME in the last 168 hours.  No results for input(s): DDIMER in the last 72  hours.  Cardiac Enzymes  Recent Labs Lab 01/25/16 0330 01/25/16 0736 01/25/16 1356  TROPONINI 0.05* 0.05* 0.04*   ------------------------------------------------------------------------------------------------------------------    Component Value Date/Time   BNP 429.5 (H) 01/24/2016 2122    Inpatient Medications  Scheduled Meds: . amLODipine  5 mg Oral Daily  . aspirin  325 mg Oral Daily  . carvedilol  25 mg Oral BID WC  . polyethylene glycol  17 g Oral Daily  . potassium chloride  10 mEq Oral Daily  . sodium chloride flush  3 mL Intravenous Q12H   Continuous Infusions: . sodium chloride 100 mL/hr at 01/27/16 [id number]  . heparin 700 Units/hr (01/27/16 0614)   PRN Meds:.acetaminophen **OR** acetaminophen, albuterol, ALPRAZolam, ondansetron **OR** ondansetron (ZOFRAN) IV, technetium TC 20M diethylenetriame-pentaacetic acid  Micro Results Recent Results (from the past 240 hour(s))  Culture, sputum-assessment     Status: None   Collection Time: 01/25/16  8:28 AM  Result Value Ref Range Status   Specimen Description SPUTUM  Final   Special Requests NONE  Final   Sputum evaluation   Final    THIS SPECIMEN IS ACCEPTABLE. RESPIRATORY CULTURE REPORT TO FOLLOW.   Report Status 01/25/2016 FINAL  Final  Culture, respiratory (NON-Expectorated)     Status: None (Preliminary result)   Collection Time: 01/25/16  8:28 AM  Result Value Ref Range Status   Specimen Description SPUTUM  Final   Special Requests NONE  Final   Gram Stain   Final    FEW WBC PRESENT,BOTH PMN AND MONONUCLEAR RARE SQUAMOUS EPITHELIAL CELLS PRESENT RARE GRAM POSITIVE COCCI IN PAIRS RARE GRAM VARIABLE ROD    Culture CULTURE REINCUBATED FOR BETTER GROWTH  Final   Report Status PENDING  Incomplete    Radiology Reports Dg Chest 2 View  Result Date: 01/24/2016 CLINICAL DATA:  One week of shortness of breath EXAM: CHEST  2 VIEW COMPARISON:  None available FINDINGS: AP and lateral views of the chest. Low lung  volumes. Eventration of right diaphragm. Hazy left upper lobe infiltrate. There is mild cardiomegaly with atherosclerosis of the aorta. Linear scar atelectasis in the right mid lung zone. Double density overlying the lower mediastinum, could relate to a large hiatal hernia. IMPRESSION: 1. Mild cardiomegaly. 2. Hazy opacity in the left lung could relate to mild infiltrate or asymmetric edema. 3. Oval opacity projects over lower mediastinum, this could relate to a large hiatal hernia. CT or swallow study could be obtained to confirm. 4. Atherosclerotic vascular disease of the aorta. Electronically Signed   By: Donavan Foil M.D.   On: 01/24/2016 22:11   Nm Pulmonary Perf And Vent  Result Date:  01/25/2016 CLINICAL DATA:  Hypoxia. EXAM: NUCLEAR MEDICINE VENTILATION - PERFUSION LUNG SCAN TECHNIQUE: Ventilation images were obtained in multiple projections using inhaled aerosol Tc-21m DTPA. Perfusion images were obtained in multiple projections after intravenous injection of Tc-19m MAA. RADIOPHARMACEUTICALS:  31.4 mCi Technetium-34m DTPA aerosol inhalation and 4.3 mCi Technetium-67m MAA IV COMPARISON:  Chest x-ray from yesterday FINDINGS: There are multiple matched ventilation and perfusion defects. There are at least 2 large wedge-shaped defects on the right which are more prominent on perfusion than ventilation imaging. IMPRESSION: High probability V/Q scan. The multiple matched defects suggest underlying airways disease as well. Findings called to the patient's nurse, Caesar. He will inform the referring physician. Electronically Signed   By: Dorise Bullion III M.D   On: 01/25/2016 13:47    Time Spent in minutes  35   Louellen Molder M.D on 01/27/2016 at 11:26 AM  Between 7am to 7pm - Pager - [phone]  After 7pm go to [email or web address] - password [facility]  [facility] -  Office  [phone]

## 2016-01-27 NOTE — Progress Notes (Signed)
[place] for heparin Indication: pulmonary embolus  Allergies  Allergen Reactions  . Lipitor [Atorvastatin] Other (See Comments)    myalgia  . Codeine Other (See Comments)  . Lovastatin Other (See Comments)  . Pravastatin Sodium Other (See Comments)    Patient Measurements: Height: 5\' 4"  (162.6 cm) Weight: 236 lb (107 kg) IBW/kg (Calculated) : 54.7 Heparin Dosing Weight: 82 kg  Vital Signs: Temp: 97.5 F (36.4 C) (10/15 2117) BP: 106/47 (10/15 2117) Pulse Rate: 43 (10/15 2300)  Labs:  Recent Labs  01/25/16 0330 01/25/16 0736 01/25/16 1356  01/26/16 0748 01/26/16 1830 01/27/16 0022  HGB 12.8 12.5  --   --  12.2  --  11.5*  HCT 39.5 39.4  --   --  38.0  --  37.5  PLT 160 155  --   --  174  --  152  HEPARINUNFRC  --   --   --   < > 1.19* 0.51 0.45  CREATININE 1.69* 1.73*  --   --  2.14*  --  2.06*  TROPONINI 0.05* 0.05* 0.04*  --   --   --   --   < > = values in this interval not displayed.  Estimated Creatinine Clearance: 23.8 mL/min (by C-G formula based on SCr of 2.06 mg/dL (H)).  Assessment: 82 y.o. female with PE for heparin  Goal of Therapy:  Heparin level 0.3-0.7 units/ml Monitor platelets by anticoagulation protocol: Yes   Plan:  Continue Heparin at current rate   Phillis Knack, PharmD, BCPS  01/27/2016

## 2016-01-28 DIAGNOSIS — Z86718 Personal history of other venous thrombosis and embolism: Secondary | ICD-10-CM | POA: Diagnosis present

## 2016-01-28 DIAGNOSIS — I5032 Chronic diastolic (congestive) heart failure: Secondary | ICD-10-CM

## 2016-01-28 LAB — BASIC METABOLIC PANEL
Anion gap: 7 (ref 5–15)
BUN: 32 mg/dL — ABNORMAL HIGH (ref 6–20)
CO2: 24 mmol/L (ref 22–32)
Calcium: 7.9 mg/dL — ABNORMAL LOW (ref 8.9–10.3)
Chloride: 108 mmol/L (ref 101–111)
Creatinine, Ser: 1.22 mg/dL — ABNORMAL HIGH (ref 0.44–1.00)
GFR calc Af Amer: 45 mL/min — ABNORMAL LOW (ref >60)
GFR calc non Af Amer: 39 mL/min — ABNORMAL LOW (ref >60)
Glucose, Bld: 90 mg/dL (ref 65–99)
Potassium: 3.7 mmol/L (ref 3.5–5.1)
Sodium: 139 mmol/L (ref 135–145)

## 2016-01-28 LAB — CBC
HCT: 37.9 % (ref 36.0–46.0)
Hemoglobin: 11.8 g/dL — ABNORMAL LOW (ref 12.0–15.0)
MCH: 28.2 pg (ref 26.0–34.0)
MCHC: 31.1 g/dL (ref 30.0–36.0)
MCV: 90.7 fL (ref 78.0–100.0)
Platelets: 169 10*3/uL (ref 150–400)
RBC: 4.18 MIL/uL (ref 3.87–5.11)
RDW: 16.1 % — ABNORMAL HIGH (ref 11.5–15.5)
WBC: 7 10*3/uL (ref 4.0–10.5)

## 2016-01-28 LAB — HEPARIN LEVEL (UNFRACTIONATED): Heparin Unfractionated: 0.33 IU/mL (ref 0.30–0.70)

## 2016-01-28 MED ORDER — RIVAROXABAN (XARELTO) VTE STARTER PACK (15 & 20 MG): ORAL_TABLET | ORAL | 0 refills | Status: DC

## 2016-01-28 MED ORDER — AMLODIPINE BESYLATE 10 MG PO TABS: 10.0000 mg | ORAL_TABLET | Freq: Every day | ORAL | Status: DC

## 2016-01-28 MED ORDER — RIVAROXABAN 15 MG PO TABS
15.0000 mg | ORAL_TABLET | Freq: Two times a day (BID) | ORAL | Status: DC
  Administered 2016-01-28: 15 mg via ORAL
  Filled 2016-01-28: qty 1

## 2016-01-28 MED ORDER — RIVAROXABAN 15 MG PO TABS: 15.0000 mg | ORAL_TABLET | Freq: Two times a day (BID) | ORAL | Status: DC

## 2016-01-28 MED ORDER — RIVAROXABAN 20 MG PO TABS: 20.0000 mg | ORAL_TABLET | Freq: Every day | ORAL | Status: DC

## 2016-01-28 MED ORDER — AMLODIPINE BESYLATE 5 MG PO TABS: 5.0000 mg | ORAL_TABLET | Freq: Every day | ORAL | Status: DC

## 2016-01-28 MED FILL — XARELTO STARTER PACK: 15 & 20 | 26 days supply | Qty: 51 | Fill #0

## 2016-01-28 NOTE — Progress Notes (Signed)
Patient will be discharged home per MD order, All discharge given to patient and family in written format, along with prescription.

## 2016-01-28 NOTE — Care Management Note (Addendum)
Case Management Note  Patient Details  Name: Heather [name] MRN: [id number] Date of Birth: June 03, 1928  Subjective/Objective: Pt presented for Acute Respiratory Failure. Pt is from home with daughter Neoma Laming. Plan will be to return home once stable.                   Action/Plan: CM did speak with daughter and pt is active with [facility] for [place]. CM did call [facility] and resumption orders to be faxed to company. DME orders for [facility], Oxygen and WC/Cushion to be placed and will be delivered by [facility]. 30 day free Xarelto card provided to patient. Co pay will be $47.00 the following month. Pt is aware. CM did call [facility] on [address] and starter pack not available. Pt will need the starter pack ordered. CM- did call the [place] and the starter pack is available. No further needs from CM at this time.   Expected Discharge Date:                  Expected Discharge Plan:  [place]  In-House Referral:  NA  Discharge planning Services  CM Consult  Post Acute Care Choice:  Home Health Choice offered to:  Adult Children  DME Arranged:  Wheelchair manual, Bedside commode, Oxygen DME Agency:  [facility] Arranged:  RN, PT [facility] Agency:  [facility]  Status of Service:  Completed, signed off  If discussed at [place] of Stay Meetings, dates discussed:    Additional Comments:  Bethena Roys, RN 01/28/2016, 10:59 AM

## 2016-01-28 NOTE — Progress Notes (Signed)
[place] for heparin>>xarelto Indication: pulmonary embolus   Assessment: 82 y.o. female with PE. Patient being transitioned from IV heparin to xarelto this am after IV access was lost. Heparin levels have been at goal, cbc has remained stable.  Goal of Therapy:  Heparin level 0.3-0.7 units/ml Monitor platelets by anticoagulation protocol: Yes   Plan:  Transition to xarelto 15mg  bid x 21 days then 20mg  daily starting on 11/7   Allergies  Allergen Reactions  . Lipitor [Atorvastatin] Other (See Comments)    myalgia  . Codeine Other (See Comments)  . Lovastatin Other (See Comments)  . Pravastatin Sodium Other (See Comments)    Patient Measurements: Height: 5\' 4"  (162.6 cm) Weight: 264 lb 1.6 oz (119.8 kg) IBW/kg (Calculated) : 54.7 Heparin Dosing Weight: 82 kg  Vital Signs: Temp: 97.5 F (36.4 C) (10/17 0406) Temp Source: Oral (10/17 0406) BP: 148/74 (10/17 0812) Pulse Rate: 54 (10/17 0406)  Labs:  Recent Labs  01/25/16 1356  01/26/16 0748 01/26/16 1830 01/27/16 0022 01/28/16 0339  HGB  --   < > 12.2  --  11.5* 11.8*  HCT  --   --  38.0  --  37.5 37.9  PLT  --   --  174  --  152 169  HEPARINUNFRC  --   < > 1.19* 0.51 0.45 0.33  CREATININE  --   --  2.14*  --  2.06* 1.22*  TROPONINI 0.04*  --   --   --   --   --   < > = values in this interval not displayed.  Estimated Creatinine Clearance: 42.9 mL/min (by C-G formula based on SCr of 1.22 mg/dL (H)).   Erin Hearing PharmD., BCPS Clinical Pharmacist Pager [phone] 01/28/2016 9:01 AM

## 2016-01-28 NOTE — Progress Notes (Signed)
SATURATION QUALIFICATIONS: (This note is used to comply with regulatory documentation for home oxygen)  Patient Saturations on Room Air at Rest = 91%  Patient Saturations on Room Air while Ambulating = 88%  Patient Saturations on 2 Liters of oxygen while Ambulating = 94%  Please briefly explain why patient needs home oxygen: 

## 2016-01-28 NOTE — Discharge Instructions (Signed)
Rivaroxaban oral tablets What is this medicine? RIVAROXABAN (ri va ROX a ban) is an anticoagulant (blood thinner). It is used to treat blood clots in the lungs or in the veins. It is also used after knee or hip surgeries to prevent blood clots. It is also used to lower the chance of stroke in people with a medical condition called atrial fibrillation. This medicine may be used for other purposes; ask your health care provider or pharmacist if you have questions. What should I tell my health care provider before I take this medicine? They need to know if you have any of these conditions: -bleeding disorders -bleeding in the brain -blood in your stools (black or tarry stools) or if you have blood in your vomit -history of stomach bleeding -kidney disease -liver disease -low blood counts, like low white cell, platelet, or red cell counts -recent or planned spinal or epidural procedure -take medicines that treat or prevent blood clots -an unusual or allergic reaction to rivaroxaban, other medicines, foods, dyes, or preservatives -pregnant or trying to get pregnant -breast-feeding How should I use this medicine? Take this medicine by mouth with a glass of water. Follow the directions on the prescription label. Take your medicine at regular intervals. Do not take it more often than directed. Do not stop taking except on your doctor's advice. Stopping this medicine may increase your risk of a blood clot. Be sure to refill your prescription before you run out of medicine. If you are taking this medicine after hip or knee replacement surgery, take it with or without food. If you are taking this medicine for atrial fibrillation, take it with your evening meal. If you are taking this medicine to treat blood clots, take it with food at the same time each day. If you are unable to swallow your tablet, you may crush the tablet and mix it in applesauce. Then, immediately eat the applesauce. You should eat more  food right after you eat the applesauce containing the crushed tablet. Talk to your pediatrician regarding the use of this medicine in children. Special care may be needed. Overdosage: If you think you have taken too much of this medicine contact a poison control center or emergency room at once. NOTE: This medicine is only for you. Do not share this medicine with others. What if I miss a dose? If you take your medicine once a day and miss a dose, take the missed dose as soon as you remember. If you take your medicine twice a day and miss a dose, take the missed dose immediately. In this instance, 2 tablets may be taken at the same time. The next day you should take 1 tablet twice a day as directed. What may interact with this medicine? -aspirin and aspirin-like medicines -certain antibiotics like erythromycin, azithromycin, and clarithromycin -certain medicines for fungal infections like ketoconazole and itraconazole -certain medicines for irregular heart beat like amiodarone, quinidine, dronedarone -certain medicines for seizures like carbamazepine, phenytoin -certain medicines that treat or prevent blood clots like warfarin, enoxaparin, and dalteparin -conivaptan -diltiazem -felodipine -indinavir -lopinavir; ritonavir -NSAIDS, medicines for pain and inflammation, like ibuprofen or naproxen -ranolazine -rifampin -ritonavir -St. John's wort -verapamil This list may not describe all possible interactions. Give your health care provider a list of all the medicines, herbs, non-prescription drugs, or dietary supplements you use. Also tell them if you smoke, drink alcohol, or use illegal drugs. Some items may interact with your medicine. What should I watch for while using this   medicine? Visit your doctor or health care professional for regular checks on your progress. Your condition will be monitored carefully while you are receiving this medicine. Notify your doctor or health care  professional and seek emergency treatment if you develop breathing problems; changes in vision; chest pain; severe, sudden headache; pain, swelling, warmth in the leg; trouble speaking; sudden numbness or weakness of the face, arm, or leg. These can be signs that your condition has gotten worse. If you are going to have surgery, tell your doctor or health care professional that you are taking this medicine. Tell your health care professional that you use this medicine before you have a spinal or epidural procedure. Sometimes people who take this medicine have bleeding problems around the spine when they have a spinal or epidural procedure. This bleeding is very rare. If you have a spinal or epidural procedure while on this medicine, call your health care professional immediately if you have back pain, numbness or tingling (especially in your legs and feet), muscle weakness, paralysis, or loss of bladder or bowel control. Avoid sports and activities that might cause injury while you are using this medicine. Severe falls or injuries can cause unseen bleeding. Be careful when using sharp tools or knives. Consider using an Copy. Take special care brushing or flossing your teeth. Report any injuries, bruising, or red spots on the skin to your doctor or health care professional. What side effects may I notice from receiving this medicine? Side effects that you should report to your doctor or health care professional as soon as possible: -allergic reactions like skin rash, itching or hives, swelling of the face, lips, or tongue -back pain -redness, blistering, peeling or loosening of the skin, including inside the mouth -signs and symptoms of bleeding such as bloody or black, tarry stools; red or dark-brown urine; spitting up blood or brown material that looks like coffee grounds; red spots on the skin; unusual bruising or bleeding from the eye, gums, or nose Side effects that usually do not require  medical attention (Report these to your doctor or health care professional if they continue or are bothersome.): -dizziness -muscle pain This list may not describe all possible side effects. Call your doctor for medical advice about side effects. You may report side effects to FDA at 1-800-FDA-1088. Where should I keep my medicine? Keep out of the reach of children. Store at room temperature between 15 and 30 degrees C (59 and 86 degrees F). Throw away any unused medicine after the expiration date. NOTE: This sheet is a summary. It may not cover all possible information. If you have questions about this medicine, talk to your doctor, pharmacist, or health care provider.    2016, Elsevier/[employer]. (2014-03-28 12:45:34)    Pulmonary Embolism A pulmonary embolism (PE) is a sudden blockage or decrease of blood flow in one lung or both lungs. Most blockages come from a blood clot that travels from the legs or the pelvis to the lungs. PE is a dangerous and potentially life-threatening condition if it is not treated right away. CAUSES A pulmonary embolism occurs most commonly when a blood clot travels from one of your veins to your lungs. Rarely, PE is caused by air, fat, amniotic fluid, or part of a tumor traveling through your veins to your lungs. RISK FACTORS A PE is more likely to develop in:  People who smoke.  People who areolder, especially over 19 years of age.  People who are overweight (obese).  People who sit or lie still for a long time, such as during long-distance travel (over 4 hours), bed rest, hospitalization, or during recovery from certain medical conditions like a stroke.  People who do not engage in much physical activity (sedentary lifestyle).  People who have chronic breathing disorders.  People whohave a personal or family history of blood clots or blood clotting disease.  People whohave peripheral vascular disease (PVD), diabetes, or some types of  cancer.  People who haveheart disease, especially if the person had a recent heart attack or has congestive heart failure.  People who have neurological diseases that affect the legs (leg paresis).  People who have had a traumatic injury, such as breaking a hip or leg.  People whohave recently had major or lengthy surgery, especially on the hip, knee, or abdomen.  People who have hada central line placed inside a large vein.  People who takemedicines that contain the hormone estrogen. These include birth control pills and hormone replacement therapy.  Pregnancy or during childbirth or the postpartum period. SIGNS AND SYMPTOMS  The symptoms of a PE usually start suddenly and include:  Shortness of breath while active or at rest.  Coughing or coughing up blood or blood-tinged mucus.  Chest pain that is often worse with deep breaths.  Rapid or irregular heartbeat.  Feeling light-headed or dizzy.  Fainting.  Feelinganxious.  Sweating. There may also be pain and swelling in a leg if that is where the blood clot started. These symptoms may represent a serious problem that is an emergency. Do not wait to see if the symptoms will go away. Get medical help right away. Call your local emergency services (911 in the [place] Do not drive yourself to the hospital. DIAGNOSIS Your health care provider will take a medical history and perform a physical exam. You may also have other tests, including:  Blood tests to assess the clotting properties of your blood, assess oxygen levels in your blood, and find blood clots.  Imaging tests, such as CT, ultrasound, MRI, X-ray, and other tests to see if you have clots anywhere in your body.  An electrocardiogram (ECG) to look for heart strain from blood clots in the lungs. TREATMENT The main goals of PE treatment are:  To stop a blood clot from growing larger.  To stop new blood clots from forming. The type of treatment that you receive  depends on many factors, such as the cause of your PE, your risk for bleeding or developing more clots, and other medical conditions that you have. Sometimes, a combination of treatments is necessary. This condition may be treated with:  Medicines, including newer oral blood thinners (anticoagulants), warfarin, low molecular weight heparins, thrombolytics, or heparins.  Wearing compression stockings or using different types of devices.  Surgery (rare) to remove the blood clot or to place a filter in your abdomen to stop the blood clot from traveling to your lungs. Treatments for a PE are often divided into immediate treatment, long-term treatment (up to 3 months after PE), and extended treatment (more than 3 months after PE). Your treatment may continue for several months. This is called maintenance therapy, and it is used to prevent the forming of new blood clots. You can work with your health care provider to choose the treatment program that is best for you. What are anticoagulants? Anticoagulants are medicines that treat PEs. They can stop current blood clots from growing and stop new clots from forming. They cannot dissolve existing clots.  Your body dissolves clots by itself over time. Anticoagulants are given by mouth, by injection, or through an IV tube. What are thrombolytics? Thrombolytics are clot-dissolving medicines that are used to dissolve a PE. They carry a high risk of bleeding, so they tend to be used only in severe cases or if you have very low blood pressure. HOME CARE INSTRUCTIONS If you are taking a newer oral anticoagulant:  Take the medicine every single day at the same time each day.  Understand what foods and drugs interact with this medicine.  Understand that there are no regular blood tests required when using this medicine.  Understandthe side effects of this medicine, including excessive bruising or bleeding. Ask your health care provider or pharmacist about  other possible side effects. If you are taking warfarin:  Understand how to take warfarin and know which foods can affect how warfarin works in Veterinary surgeon.  Understand that it is dangerous to taketoo much or too little warfarin. Too much warfarin increases the risk of bleeding. Too little warfarin continues to allow the risk for blood clots.  Follow your PT and INR blood testing schedule. The PT and INR results allow your health care provider to adjust your dose of warfarin. It is very important that you have your PT and INR tested as often as told by your health care provider.  Avoid major changes in your diet, or tell your health care provider before you change your diet. Arrange a visit with a registered dietitian to answer your questions. Many foods, especially foods that are high in vitamin K, can interfere with warfarin and affect the PT and INR results. Eat a consistent amount of foods that are high in vitamin K, such as:  Spinach, kale, broccoli, cabbage, collard greens, turnip greens, Brussels sprouts, peas, cauliflower, seaweed, and parsley.  Beef liver and pork liver.  Green tea.  Soybean oil.  Tell your health care provider about any and all medicines, vitamins, and supplements that you take, including aspirin and other over-the-counter anti-inflammatory medicines. Be especially cautious with aspirin and anti-inflammatory medicines. Do not take those before you ask your health care provider if it is safe to do so. This is important because many medicines can interfere with warfarin and affect the PT and INR results.  Do not start or stop taking any over-the-counter or prescription medicine unless your health care provider or pharmacist tells you to do so. If you take warfarin, you will also need to do these things:  Hold pressure over cuts for longer than usual.  Tell your dentist and other health care providers that you are taking warfarin before you have any procedures in  which bleeding may occur.  Avoid alcohol or drink very small amounts. Tell your health care provider if you change your alcohol intake.  Do not use tobacco products, including cigarettes, chewing tobacco, and e-cigarettes. If you need help quitting, ask your health care provider.  Avoid contact sports. General Instructions  Take over-the-counter and prescription medicines only as told by your health care provider. Anticoagulant medicines can have side effects, including easy bruising and difficulty stopping bleeding. If you are prescribed an anticoagulant, you will also need to do these things:  Hold pressure over cuts for longer than usual.  Tell your dentist and other health care providers that you are taking anticoagulants before you have any procedures in which bleeding may occur.  Avoid contact sports.  Wear a medical alert bracelet or carry a medical alert card that  says you have had a PE.  Ask your health care provider how soon you can go back to your normal activities. Stay active to prevent new blood clots from forming.  Make sure to exercise while traveling or when you have been sitting or standing for a long period of time. It is very important to exercise. Exercise your legs by walking or by tightening and relaxing your leg muscles often. Take frequent walks.  Wear compression stockings as told by your health care provider to help prevent more blood clots from forming.  Do not use tobacco products, including cigarettes, chewing tobacco, and e-cigarettes. If you need help quitting, ask your health care provider.  Keep all follow-up appointments with your health care provider. This is important. PREVENTION Take these actions to decrease your risk of developing another PE:  Exercise regularly. For at least 30 minutes every day, engage in:  Activity that involves moving your arms and legs.  Activity that encourages good blood flow through your body by increasing your heart  rate.  Exercise your arms and legs every hour during long-distance travel (over 4 hours). Drink plenty of water and avoid drinking alcohol while traveling.  Avoid sitting or lying in bed for long periods of time without moving your legs.  Maintain a weight that is appropriate for your height. Ask your health care provider what weight is healthy for you.  If you are a woman who is over 52 years of age, avoid unnecessary use of medicines that contain estrogen. These include birth control pills.  Do not smoke, especially if you take estrogen medicines. If you need help quitting, ask your health care provider.  If you are at very high risk for PE, wear compression stockings.  If you recently had a PE, have regularly scheduled ultrasound testing on your legs to check for new blood clots. If you are hospitalized, prevention measures may include:  Early walking after surgery, as soon as your health care provider says that it is safe.  Receiving anticoagulants to prevent blood clots. If you cannot take anticoagulants, other options may be available, such as wearing compression stockings or using different types of devices. SEEK IMMEDIATE MEDICAL CARE IF:  You have new or increased pain, swelling, or redness in an arm or leg.  You have numbness or tingling in an arm or leg.  You have shortness of breath while active or at rest.  You have chest pain.  You have a rapid or irregular heartbeat.  You feel light-headed or dizzy.  You cough up blood.  You notice blood in your vomit, bowel movement, or urine.  You have a fever. These symptoms may represent a serious problem that is an emergency. Do not wait to see if the symptoms will go away. Get medical help right away. Call your local emergency services (911 in the [place] Do not drive yourself to the hospital.   This information is not intended to replace advice given to you by your health care provider. Make sure you discuss any  questions you have with your health care provider.   Document Released: 03/27/2000 Document Revised: 12/19/2014 Document Reviewed: 07/25/2014 Elsevier Interactive Patient Education 2016 [place].   Deep Vein Thrombosis A deep vein thrombosis (DVT) is a blood clot (thrombus) that usually occurs in a deep, larger vein of the lower leg or the pelvis, or in an upper extremity such as the arm. These are dangerous and can lead to serious and even life-threatening complications if the  clot travels to the lungs. A DVT can damage the valves in your leg veins so that instead of flowing upward, the blood pools in the lower leg. This is called post-thrombotic syndrome, and it can result in pain, swelling, discoloration, and sores on the leg. CAUSES A DVT is caused by the formation of a blood clot in your leg, pelvis, or arm. Usually, several things contribute to the formation of blood clots. A clot may develop when:  Your blood flow slows down.  Your vein becomes damaged in some way.  You have a condition that makes your blood clot more easily. RISK FACTORS A DVT is more likely to develop in:  People who are older, especially over 45 years of age.  People who are overweight (obese).  People who sit or lie still for a long time, such as during long-distance travel (over 4 hours), bed rest, hospitalization, or during recovery from certain medical conditions like a stroke.  People who do not engage in much physical activity (sedentary lifestyle).  People who have chronic breathing disorders.  People who have a personal or family history of blood clots or blood clotting disease.  People who have peripheral vascular disease (PVD), diabetes, or some types of cancer.  People who have heart disease, especially if the person had a recent heart attack or has congestive heart failure.  People who have neurological diseases that affect the legs (leg paresis).  People who have had a traumatic  injury, such as breaking a hip or leg.  People who have recently had major or lengthy surgery, especially on the hip, knee, or abdomen.  People who have had a central line placed inside a large vein.  People who take medicines that contain the hormone estrogen. These include birth control pills and hormone replacement therapy.  Pregnancy or during childbirth or the postpartum period.  Long plane flights (over 8 hours). SIGNS AND SYMPTOMS Symptoms of a DVT can include:   Swelling of your leg or arm, especially if one side is much worse.  Warmth and redness of your leg or arm, especially if one side is much worse.  Pain in your arm or leg. If the clot is in your leg, symptoms may be more noticeable or worse when you stand or walk.  A feeling of pins and needles, if the clot is in the arm. The symptoms of a DVT that has traveled to the lungs (pulmonary embolism, PE) usually start suddenly and include:  Shortness of breath while active or at rest.  Coughing or coughing up blood or blood-tinged mucus.  Chest pain that is often worse with deep breaths.  Rapid or irregular heartbeat.  Feeling light-headed or dizzy.  Fainting.  Feeling anxious.  Sweating. There may also be pain and swelling in a leg if that is where the blood clot started. These symptoms may represent a serious problem that is an emergency. Do not wait to see if the symptoms will go away. Get medical help right away. Call your local emergency services (911 in the [place] Do not drive yourself to the hospital. DIAGNOSIS Your health care provider will take a medical history and perform a physical exam. You may also have other tests, including:  Blood tests to assess the clotting properties of your blood.  Imaging tests, such as CT, ultrasound, MRI, X-ray, and other tests to see if you have clots anywhere in your body. TREATMENT After a DVT is identified, it can be treated. The type of treatment  that you receive  depends on many factors, such as the cause of your DVT, your risk for bleeding or developing more clots, and other medical conditions that you have. Sometimes, a combination of treatments is necessary. Treatment options may be combined and include:  Monitoring the blood clot with ultrasound.  Taking medicines by mouth, such as newer blood thinners (anticoagulants), thrombolytics, or warfarin.  Taking anticoagulant medicine by injection or through an IV tube.  Wearing compression stockings or using different types ofdevices.  Surgery (rare) to remove the blood clot or to place a filter in your abdomen to stop the blood clot from traveling to your lungs. Treatments for a DVT are often divided into immediate treatment and long-term treatment (up to 3 months after DVT). You can work with your health care provider to choose the treatment program that is best for you. HOME CARE INSTRUCTIONS If you are taking a newer oral anticoagulant:  Take the medicine every single day at the same time each day.  Understand what foods and drugs interact with this medicine.  Understand that there are no regular blood tests required when using this medicine.  Understand the side effects of this medicine, including excessive bruising or bleeding. Ask your health care provider or pharmacist about other possible side effects. If you are taking warfarin:  Understand how to take warfarin and know which foods can affect how warfarin works in Veterinary surgeon.  Understand that it is dangerous to take too much or too little warfarin. Too much warfarin increases the risk of bleeding. Too little warfarin continues to allow the risk for blood clots.  Follow your PT and INR blood testing schedule. The PT and INR results allow your health care provider to adjust your dose of warfarin. It is very important that you have your PT and INR tested as often as told by your health care provider.  Avoid major changes in your diet, or  tell your health care provider before you change your diet. Arrange a visit with a registered dietitian to answer your questions. Many foods, especially foods that are high in vitamin K, can interfere with warfarin and affect the PT and INR results. Eat a consistent amount of foods that are high in vitamin K, such as:  Spinach, kale, broccoli, cabbage, collard greens, turnip greens, Brussels sprouts, peas, cauliflower, seaweed, and parsley.  Beef liver and pork liver.  Green tea.  Soybean oil.  Tell your health care provider about any and all medicines, vitamins, and supplements that you take, including aspirin and other over-the-counter anti-inflammatory medicines. Be especially cautious with aspirin and anti-inflammatory medicines. Do not take those before you ask your health care provider if it is safe to do so. This is important because many medicines can interfere with warfarin and affect the PT and INR results.  Do not start or stop taking any over-the-counter or prescription medicine unless your health care provider or pharmacist tells you to do so. If you take warfarin, you will also need to do these things:  Hold pressure over cuts for longer than usual.  Tell your dentist and other health care providers that you are taking warfarin before you have any procedures in which bleeding may occur.  Avoid alcohol or drink very small amounts. Tell your health care provider if you change your alcohol intake.  Do not use tobacco products, including cigarettes, chewing tobacco, and e-cigarettes. If you need help quitting, ask your health care provider.  Avoid contact sports. General Instructions  Take over-the-counter and prescription medicines only as told by your health care provider. Anticoagulant medicines can have side effects, including easy bruising and difficulty stopping bleeding. If you are prescribed an anticoagulant, you will also need to do these things:  Hold pressure over  cuts for longer than usual.  Tell your dentist and other health care providers that you are taking anticoagulants before you have any procedures in which bleeding may occur.  Avoid contact sports.  Wear a medical alert bracelet or carry a medical alert card that says you have had a PE.  Ask your health care provider how soon you can go back to your normal activities. Stay active to prevent new blood clots from forming.  Make sure to exercise while traveling or when you have been sitting or standing for a long period of time. It is very important to exercise. Exercise your legs by walking or by tightening and relaxing your leg muscles often. Take frequent walks.  Wear compression stockings as told by your health care provider to help prevent more blood clots from forming.  Do not use tobacco products, including cigarettes, chewing tobacco, and e-cigarettes. If you need help quitting, ask your health care provider.  Keep all follow-up appointments with your health care provider. This is important. PREVENTION Take these actions to decrease your risk of developing another DVT:  Exercise regularly. For at least 30 minutes every day, engage in:  Activity that involves moving your arms and legs.  Activity that encourages good blood flow through your body by increasing your heart rate.  Exercise your arms and legs every hour during long-distance travel (over 4 hours). Drink plenty of water and avoid drinking alcohol while traveling.  Avoid sitting or lying in bed for long periods of time without moving your legs.  Maintain a weight that is appropriate for your height. Ask your health care provider what weight is healthy for you.  If you are a woman who is over 20 years of age, avoid unnecessary use of medicines that contain estrogen. These include birth control pills.  Do not smoke, especially if you take estrogen medicines. If you need help quitting, ask your health care provider. If you  are hospitalized, prevention measures may include:  Early walking after surgery, as soon as your health care provider says that it is safe.  Receiving anticoagulants to prevent blood clots.If you cannot take anticoagulants, other options may be available, such as wearing compression stockings or using different types of devices. SEEK IMMEDIATE MEDICAL CARE IF:  You have new or increased pain, swelling, or redness in an arm or leg.  You have numbness or tingling in an arm or leg.  You have shortness of breath while active or at rest.  You have chest pain.  You have a rapid or irregular heartbeat.  You feel light-headed or dizzy.  You cough up blood.  You notice blood in your vomit, bowel movement, or urine. These symptoms may represent a serious problem that is an emergency. Do not wait to see if the symptoms will go away. Get medical help right away. Call your local emergency services (911 in the [place] Do not drive yourself to the hospital.   This information is not intended to replace advice given to you by your health care provider. Make sure you discuss any questions you have with your health care provider.   Document Released: 03/30/2005 Document Revised: 12/19/2014 Document Reviewed: 07/25/2014 Elsevier Interactive Patient Education [employer].

## 2016-01-28 NOTE — Progress Notes (Signed)
Patient was off O2 at rest Sats maintained 90-92%, if and when patient moved O2 levels dropped to 88 %. Spoke with MD about sending pt home with O2. ,. Family aware.

## 2016-01-28 NOTE — Discharge Summary (Signed)
Physician Discharge Summary  Heather [name] [id number] DOB: May 30, 1928 DOA: 01/24/2016  PCP: [name], MD  Admit date: 01/24/2016 Discharge date: 01/28/2016  Admitted From: Home Disposition: Home with home health  Recommendations for Outpatient Follow-up:  1. Follow up with PCP in 1 weeks.  2. Patient is being discharged on Xarelto for bilateral leg DVT and PE (total duration of at least 6 months, until 07/30/2015) 3. Please obtain H&H and renal function during outpatient follow-up. If renal function is normal please resume patient's Lasix and ARB.  Home Health: PT and RN Equipment/Devices: O2 via nasal cannula (2 L/m, on ambulation), wheelchair  Discharge Condition: Fair CODE STATUS: Full code Diet recommendation: Heart Healthy   Discharge Diagnoses:  Principal Problem:   Acute respiratory failure with hypoxia ([place])   Active Problems:   Pulmonary embolus (HCC)   DVT of lower extremity, bilateral (HCC)   General weakness   Acute kidney injury ([place])   Diastolic CHF, chronic (HCC)   Hyperlipidemia   Essential hypertension   Memory dysfunction   Brief narrative/history of present illness Please refer to admission H&P for details, in brief,82 year old female with hypertension, peripheral edema, mild dementia presented to the ER with worsening shortness of breath for past few days worsened on the day of admission. She denied any chest pain or productive cough. In the ED vitals were stable with chest x-ray showing possible congestion versus infiltrate. BNP was elevated to 400. Patient also reported generalized weakness with difficulty ambulating. Patient admitted for possible CHF versus pneumonia. However her d-dimer was significantly elevated and VQ scan done showed hypermobility for PE. Doppler lower images showed bilateral DVT.  Hospital course Principal Problem:   Acute respiratory failure with hypoxia ([place]) Suspect due to his acute PE. VQ scan with high probability  for PE, bilateral leg DVT on Doppler. -Patient placed on IV heparin. Switched to [employer] today since renal function has improved. -Discussed anticoagulation duration and options with patient and her daughter and they preferred to be on Xarelto. -2-D echo with normal EF, some diastolic dysfunction but no wall motion abnormality. Shows moderately increased pulmonary artery pressure. -  Wheezing improved with nebs. -Oxygen desaturation on ambulation. Needs oxygen via nasal cannula on ambulation (2 L/m)   Active Problems: Acute kidney injury (HCC) -Prerenal (FeNa 0.4) Held ARB and Lasix. It appears that patient was taking ibuprofen at home regularly for pain. Renal function improved with IV hydration. (Creatinine 1.22 today). -Patient instructed clearly on avoiding NSAIDs. -I will hold her Lasix and ARB upon discharge and should have her renal function checked during PCP follow-up next week. If it is normal her Lasix in a week and be resumed.   Hypokalemia Replenished  Essential hypertension   continue amlodipine   Generalized weakness PT evaluation recommends home health.   CHF Patient hypovolemic and received IV fluids. Continue aspirin and beta blocker.  Morbid obesity   Family Communication  : Daughter at bedside  Disposition Plan  : Home   Consults  : None  Procedures  :  VQ scan 2-D echo Doppler lower extremities    Discharge Instructions     Medication List    STOP taking these medications   benazepril 40 MG tablet Commonly known as:  LOTENSIN   furosemide 40 MG tablet Commonly known as:  LASIX   ibuprofen 200 MG tablet Commonly known as:  ADVIL,MOTRIN     TAKE these medications   amLODipine 5 MG tablet Commonly known as:  NORVASC Take 1 tablet (  5 mg total) by mouth daily.   aspirin 325 MG EC tablet Take 325 mg by mouth daily.   carvedilol 25 MG tablet Commonly known as:  COREG Take 1 tablet (25 mg total) by mouth 2 (two) times  daily with a meal.   Rivaroxaban 15 & 20 MG Tbpk Take as directed on package: Start with one 15mg  tablet by mouth twice a day with food. On Day 22, switch to one 20mg  tablet once a day with food.      Follow-up Information    [name], MD. Schedule an appointment as soon as possible for a visit in 1 week(s).   Specialties:  Internal Medicine, Radiology Contact information: [address] [place] [address] [phone]          Allergies  Allergen Reactions  . Lipitor [Atorvastatin] Other (See Comments)    myalgia  . Codeine Other (See Comments)  . Lovastatin Other (See Comments)  . Pravastatin Sodium Other (See Comments)      Procedures/Studies: Dg Chest 2 View  Result Date: 01/24/2016 CLINICAL DATA:  One week of shortness of breath EXAM: CHEST  2 VIEW COMPARISON:  None available FINDINGS: AP and lateral views of the chest. Low lung volumes. Eventration of right diaphragm. Hazy left upper lobe infiltrate. There is mild cardiomegaly with atherosclerosis of the aorta. Linear scar atelectasis in the right mid lung zone. Double density overlying the lower mediastinum, could relate to a large hiatal hernia. IMPRESSION: 1. Mild cardiomegaly. 2. Hazy opacity in the left lung could relate to mild infiltrate or asymmetric edema. 3. Oval opacity projects over lower mediastinum, this could relate to a large hiatal hernia. CT or swallow study could be obtained to confirm. 4. Atherosclerotic vascular disease of the aorta. Electronically Signed   By: Donavan Foil M.D.   On: 01/24/2016 22:11   Nm Pulmonary Perf And Vent  Result Date: 01/25/2016 CLINICAL DATA:  Hypoxia. EXAM: NUCLEAR MEDICINE VENTILATION - PERFUSION LUNG SCAN TECHNIQUE: Ventilation images were obtained in multiple projections using inhaled aerosol Tc-38m DTPA. Perfusion images were obtained in multiple projections after intravenous injection of Tc-84m MAA. RADIOPHARMACEUTICALS:  31.4 mCi Technetium-37m DTPA  aerosol inhalation and 4.3 mCi Technetium-15m MAA IV COMPARISON:  Chest x-ray from yesterday FINDINGS: There are multiple matched ventilation and perfusion defects. There are at least 2 large wedge-shaped defects on the right which are more prominent on perfusion than ventilation imaging. IMPRESSION: High probability V/Q scan. The multiple matched defects suggest underlying airways disease as well. Findings called to the patient's nurse, Caesar. He will inform the referring physician. Electronically Signed   By: Dorise Bullion III M.D   On: 01/25/2016 13:47    2-D echo Study Conclusions  - Left ventricle: The cavity size was normal. Systolic function was   normal. The estimated ejection fraction was in the range of 55%   to 60%. Wall motion was normal; there were no regional wall   motion abnormalities. Doppler parameters are consistent with high   ventricular filling pressure. Diastolic dysfunction, grade   indeterminate. - Aortic valve: There was mild regurgitation. - Mitral valve: Calcified annulus. There was mild regurgitation. - Left atrium: The atrium was moderately dilated. - Right ventricle: The cavity size was mildly dilated. Systolic   function was mildly reduced. - Right atrium: The atrium was mildly dilated. - Tricuspid valve: There was mild-moderate regurgitation. - Pulmonary arteries: Systolic pressure was moderately increased.   Subjective:   Discharge Exam: Vitals:  01/28/16 0812 01/28/16 1007  BP: (!) 148/74   Pulse:  (!) 47  Resp:    Temp:     Vitals:   01/28/16 0328 01/28/16 0406 01/28/16 0812 01/28/16 1007  BP:  124/79 (!) 148/74   Pulse:  (!) 54  (!) 47  Resp:  (!) 25    Temp:  97.5 F (36.4 C)    TempSrc:  Oral    SpO2: 96% 90%  94%  Weight:  119.8 kg (264 lb 1.6 oz)    Height:  5\' 4"  (1.626 m)       Gen: not in distress HEENT: Moist mucosa, supple neck Chest: Clear breath sounds bilaterally CVS: N S1&S2, no murmurs, rubs or gallop GI:  soft, NT, ND, bowel sounds present Musculoskeletal: warm, no edema, left leg >right leg   The results of significant diagnostics from this hospitalization (including imaging, microbiology, ancillary and laboratory) are listed below for reference.     Microbiology: Recent Results (from the past 240 hour(s))  Culture, sputum-assessment     Status: None   Collection Time: 01/25/16  8:28 AM  Result Value Ref Range Status   Specimen Description SPUTUM  Final   Special Requests NONE  Final   Sputum evaluation   Final    THIS SPECIMEN IS ACCEPTABLE. RESPIRATORY CULTURE REPORT TO FOLLOW.   Report Status 01/25/2016 FINAL  Final  Culture, respiratory (NON-Expectorated)     Status: None   Collection Time: 01/25/16  8:28 AM  Result Value Ref Range Status   Specimen Description SPUTUM  Final   Special Requests NONE  Final   Gram Stain   Final    FEW WBC PRESENT,BOTH PMN AND MONONUCLEAR RARE SQUAMOUS EPITHELIAL CELLS PRESENT RARE GRAM POSITIVE COCCI IN PAIRS RARE GRAM VARIABLE ROD    Culture Consistent with normal respiratory flora.  Final   Report Status 01/27/2016 FINAL  Final     Labs: BNP (last 3 results)  Recent Labs  01/24/16 2122  BNP [id number]*   Basic Metabolic Panel:  Recent Labs Lab 01/24/16 2122 01/25/16 0330 01/25/16 0736 01/26/16 0748 01/27/16 0022 01/28/16 0339  NA 141  --  140 139 139 139  K 3.4*  --  3.8 3.5 3.9 3.7  CL 98*  --  99* 100* 101 108  CO2 29  --  28 27 28 24   GLUCOSE 174*  --  149* 124* 114* 90  BUN 25*  --  30* 42* 47* 32*  CREATININE 1.69* 1.69* 1.73* 2.14* 2.06* 1.22*  CALCIUM 9.9  --  9.7 9.0 8.4* 7.9*   Liver Function Tests:  Recent Labs Lab 01/25/16 0736  AST 23  ALT 17  ALKPHOS 62  BILITOT 1.0  PROT 6.8  ALBUMIN 3.1*   No results for input(s): LIPASE, AMYLASE in the last 168 hours. No results for input(s): AMMONIA in the last 168 hours. CBC:  Recent Labs Lab 01/24/16 2122 01/25/16 0330 01/25/16 0736 01/26/16 0748  01/27/16 0022 01/28/16 0339  WBC 8.3 8.3 8.2 14.8* 9.8 7.0  NEUTROABS 5.8  --  6.8  --   --   --   HGB 12.9 12.8 12.5 12.2 11.5* 11.8*  HCT 39.9 39.5 39.4 38.0 37.5 37.9  MCV 88.5 87.4 88.3 87.6 89.3 90.7  PLT 163 160 155 174 152 169   Cardiac Enzymes:  Recent Labs Lab 01/25/16 0330 01/25/16 0736 01/25/16 1356  TROPONINI 0.05* 0.05* 0.04*   BNP: Invalid input(s): POCBNP CBG: No results for input(s): GLUCAP in  the last 168 hours. D-Dimer No results for input(s): DDIMER in the last 72 hours. Hgb A1c No results for input(s): HGBA1C in the last 72 hours. Lipid Profile No results for input(s): CHOL, HDL, LDLCALC, TRIG, CHOLHDL, LDLDIRECT in the last 72 hours. Thyroid function studies No results for input(s): TSH, T4TOTAL, T3FREE, THYROIDAB in the last 72 hours.  Invalid input(s): FREET3 Anemia work up No results for input(s): VITAMINB12, FOLATE, FERRITIN, TIBC, IRON, RETICCTPCT in the last 72 hours. Urinalysis    Component Value Date/Time   COLORURINE YELLOW 01/27/2016 0611   APPEARANCEUR CLEAR 01/27/2016 0611   LABSPEC 1.016 01/27/2016 0611   PHURINE 5.0 01/27/2016 0611   GLUCOSEU NEGATIVE 01/27/2016 0611   GLUCOSEU NEGATIVE 01/01/2016 0958   HGBUR NEGATIVE 01/27/2016 0611   BILIRUBINUR NEGATIVE 01/27/2016 0611   KETONESUR NEGATIVE 01/27/2016 0611   PROTEINUR NEGATIVE 01/27/2016 0611   UROBILINOGEN 0.2 01/01/2016 0958   NITRITE NEGATIVE 01/27/2016 0611   LEUKOCYTESUR NEGATIVE 01/27/2016 0611   Sepsis Labs Invalid input(s): PROCALCITONIN,  WBC,  LACTICIDVEN Microbiology Recent Results (from the past 240 hour(s))  Culture, sputum-assessment     Status: None   Collection Time: 01/25/16  8:28 AM  Result Value Ref Range Status   Specimen Description SPUTUM  Final   Special Requests NONE  Final   Sputum evaluation   Final    THIS SPECIMEN IS ACCEPTABLE. RESPIRATORY CULTURE REPORT TO FOLLOW.   Report Status 01/25/2016 FINAL  Final  Culture, respiratory  (NON-Expectorated)     Status: None   Collection Time: 01/25/16  8:28 AM  Result Value Ref Range Status   Specimen Description SPUTUM  Final   Special Requests NONE  Final   Gram Stain   Final    FEW WBC PRESENT,BOTH PMN AND MONONUCLEAR RARE SQUAMOUS EPITHELIAL CELLS PRESENT RARE GRAM POSITIVE COCCI IN PAIRS RARE GRAM VARIABLE ROD    Culture Consistent with normal respiratory flora.  Final   Report Status 01/27/2016 FINAL  Final     Time coordinating discharge: Over 30 minutes  SIGNED:   Louellen Molder, MD  [facility] 01/28/2016, 11:28 AM Pager   If 7PM-7AM, please contact night-coverage [email or web address] Password [email or web address]

## 2016-01-28 NOTE — Care Management Important Message (Signed)
Important Message  Patient Details  Name: Heather [name] MRN: [id number] Date of Birth: 02/27/1929   Medicare Important Message Given:  Yes    [name], [name] 01/28/2016, 10:33 AM

## 2016-01-29 ENCOUNTER — Telehealth: Payer: Self-pay | Admitting: *Deleted

## 2016-01-29 ENCOUNTER — Ambulatory Visit: Payer: Medicare Other | Admitting: Internal Medicine

## 2016-01-29 NOTE — Telephone Encounter (Signed)
Transition Care Management Follow-up Telephone Call   Date discharged? 01/28/16   How have you been since you were released from the hospital? Pt states she is doing fine   Do you understand why you were in the hospital? YES   Do you understand the discharge instructions? YES   Where were you discharged to? Home   Items Reviewed:  Medications reviewed: YES  Allergies reviewed: YES  Dietary changes reviewed: NO  Referrals reviewed: NO   Functional Questionnaire:   Activities of Daily Living (ADLs):   She states She are independent in the following: ambulation, bathing and hygiene, feeding, continence, grooming, toileting and dressing States She doesn't require assistance    Any transportation issues/concerns?: NO   Any patient concerns? NO   Confirmed importance and date/time of follow-up visits scheduled YES, appt made 02/05/16  Provider Appointment booked with Dr Jenny Reichmann  Confirmed with patient if condition begins to worsen call PCP or go to the ER.  Patient was given the office number and encouraged to call back with question or concerns.  : YES

## 2016-01-30 ENCOUNTER — Ambulatory Visit: Payer: Medicare Other | Admitting: Internal Medicine

## 2016-02-02 ENCOUNTER — Ambulatory Visit
Admission: RE | Admit: 2016-02-02 | Discharge: 2016-02-02 | Disposition: A | Discharge status: Home / Self Care | Payer: Medicare Other | Source: Ambulatory Visit | Attending: Internal Medicine | Admitting: Internal Medicine

## 2016-02-02 DIAGNOSIS — I639 Cerebral infarction, unspecified: Secondary | ICD-10-CM | POA: Diagnosis not present

## 2016-02-02 DIAGNOSIS — R7302 Impaired glucose tolerance (oral): Secondary | ICD-10-CM

## 2016-02-02 DIAGNOSIS — R413 Other amnesia: Secondary | ICD-10-CM

## 2016-02-02 DIAGNOSIS — R531 Weakness: Secondary | ICD-10-CM

## 2016-02-02 DIAGNOSIS — R609 Edema, unspecified: Secondary | ICD-10-CM

## 2016-02-02 DIAGNOSIS — Z91148 Patient's other noncompliance with medication regimen for other reason: Secondary | ICD-10-CM

## 2016-02-02 DIAGNOSIS — I10 Essential (primary) hypertension: Secondary | ICD-10-CM

## 2016-02-02 DIAGNOSIS — R600 Localized edema: Secondary | ICD-10-CM

## 2016-02-02 DIAGNOSIS — Z9114 Patient's other noncompliance with medication regimen: Secondary | ICD-10-CM

## 2016-02-03 ENCOUNTER — Telehealth: Payer: Self-pay

## 2016-02-03 NOTE — Telephone Encounter (Signed)
Sierra from [facility] called about patient. MRI of head acute/subacute small left periatrial infarct without associated hemorrhage. Please advise

## 2016-02-03 NOTE — Telephone Encounter (Signed)
The acute/subacute stroke is likely incidental but still very serious  OK to call pt, needs to go to ER  - NOW

## 2016-02-03 NOTE — Telephone Encounter (Signed)
Home Health Cert/Plan of Care received (01/14/2016 - 03/13/2016) and placed on MD's desk for signature

## 2016-02-04 ENCOUNTER — Encounter (HOSPITAL_COMMUNITY): Payer: Self-pay | Admitting: Emergency Medicine

## 2016-02-04 ENCOUNTER — Telehealth: Payer: Self-pay

## 2016-02-04 ENCOUNTER — Emergency Department (HOSPITAL_COMMUNITY)
Admission: EM | Admit: 2016-02-04 | Discharge: 2016-02-04 | Disposition: A | Discharge status: Home / Self Care | Payer: Medicare Other | Attending: Physician Assistant | Admitting: Physician Assistant

## 2016-02-04 DIAGNOSIS — I110 Hypertensive heart disease with heart failure: Secondary | ICD-10-CM | POA: Insufficient documentation

## 2016-02-04 DIAGNOSIS — I639 Cerebral infarction, unspecified: Secondary | ICD-10-CM

## 2016-02-04 DIAGNOSIS — Z7982 Long term (current) use of aspirin: Secondary | ICD-10-CM | POA: Insufficient documentation

## 2016-02-04 DIAGNOSIS — Z79899 Other long term (current) drug therapy: Secondary | ICD-10-CM | POA: Insufficient documentation

## 2016-02-04 DIAGNOSIS — I5032 Chronic diastolic (congestive) heart failure: Secondary | ICD-10-CM | POA: Diagnosis not present

## 2016-02-04 DIAGNOSIS — R69 Illness, unspecified: Secondary | ICD-10-CM | POA: Diagnosis not present

## 2016-02-04 NOTE — ED Triage Notes (Signed)
Per EMS, patient was notified by cardiologist today that MRI showed stroke.  MRI was done on Sunday. Patient having no deficits, no weakness, no numbness, no vision issues, no symptoms of stroke.  Patient was admitted in hospital two weeks ago for respiratory distress from a PE.  Patient on Xarelto for DVT and PE. VS 136/74, 78 HR, 18 RR, 98% on 2 L.  Patient on 2L of O2 at home.  Patient denies pain.

## 2016-02-04 NOTE — ED Notes (Signed)
Pt's VS updated. Pt advised to change back into clothes and prepare for D/C. Pt is requesting wheelchair.

## 2016-02-04 NOTE — Addendum Note (Signed)
Addended by: Biagio Borg on: 02/04/2016 07:05 PM   Modules accepted: Orders

## 2016-02-04 NOTE — ED Notes (Signed)
Pt states that reason doctor did MRI was due to left leg weakness.

## 2016-02-04 NOTE — Discharge Instructions (Signed)
Please follow up tomorrow with her primary care physician. Please return with any concerns. You will also need follow-up with a neurologist. We have given you the number.

## 2016-02-04 NOTE — ED Notes (Signed)
Pt has three red spots on bottom, one is near pubic area and two on backside.  Daughter has been taking care of these at home.

## 2016-02-04 NOTE — Telephone Encounter (Signed)
Referral done

## 2016-02-04 NOTE — Telephone Encounter (Signed)
Spoke to doctor who saw patient in the hospital. No new findings were present. Patient was discharged from hospital and has been advised to see PCP in outpatient workup. Please advise they would also like patient to have referral for neurology.

## 2016-02-04 NOTE — ED Notes (Signed)
Dr in room with patient at this time.

## 2016-02-04 NOTE — ED Provider Notes (Addendum)
[facility] DEPT Provider Note   CSN: [id number] Arrival date & time: 02/04/16  [id number]     History   Chief Complaint Chief Complaint  Patient presents with  . Cerebrovascular Accident    HPI Heather [name] is a 82 y.o. female.  HPI   Patient is an 82 year old female with past medical history significant for encephalocele, hypertension hyperlipidemia mild dementia presenting today with stroke found on outpatient MRI.n patient had recent hospitalization and was discharged on the 17th this month. At that time she was found to have a pulmonary embolism and started xarlto, discontinued aspirin. Patient's PT and OT at the time. At that time she was feeling unsteady on her feet. However no MRI was done at that time. Outpatient MRI shows subacute, stroke. This likely originates from the time of original admission  Patient has had no new symptoms since discharge on the 17th of this month. She is been taking xarlto  Past Medical History:  Diagnosis Date  . ALLERGIC RHINITIS 12/09/2006  . DEGENERATIVE JOINT DISEASE, LEFT KNEE 01/23/2007  . Encephalocele ([place]) 01/23/2007  . FEVER UNSPECIFIED 01/28/2010  . GERD 12/09/2006  . GLUCOSE INTOLERANCE 01/24/2009  . GOUT 12/09/2006  . HYPERCHOLESTEROLEMIA 12/09/2006  . HYPERLIPIDEMIA 12/18/2006  . HYPERTENSION 12/09/2006  . Impaired glucose tolerance 07/26/2010  . MENOPAUSAL DISORDER 01/25/2008    Patient Active Problem List   Diagnosis Date Noted  . DVT of lower extremity, bilateral ([place]) 01/28/2016  . Diastolic CHF, chronic ([place]) 01/28/2016  . Acute kidney injury ([place])   . Pulmonary embolus ([place])   . Acute respiratory failure with hypoxia ([place]) 01/24/2016  . Memory dysfunction 01/01/2016  . Non compliance w medication regimen 01/01/2016  . General weakness 01/01/2016  . Peripheral edema 01/01/2016  . Chronic venous insufficiency 08/01/2014  . Fall at home 08/01/2013  . Osteopenia 08/01/2013  . Left knee pain 01/29/2011  . Encounter for  well adult exam with abnormal findings 07/26/2010  . Impaired glucose tolerance 07/26/2010  . MENOPAUSAL DISORDER 01/25/2008  . Osteoarthrosis, unspecified whether generalized or localized, involving lower leg 01/23/2007  . Encephalocele ([place]) 01/23/2007  . Hyperlipidemia 12/18/2006  . GOUT 12/09/2006  . Essential hypertension 12/09/2006  . ALLERGIC RHINITIS 12/09/2006  . GERD 12/09/2006    Past Surgical History:  Procedure Laterality Date  . s/p anterior encephalocele repair and craniotomy  2003    OB History    No data available       Home Medications    Prior to Admission medications   Medication Sig Start Date End Date Taking? Authorizing Provider  amLODipine (NORVASC) 5 MG tablet Take 1 tablet (5 mg total) by mouth daily. 01/01/16   Biagio Borg, MD  aspirin 325 MG EC tablet Take 325 mg by mouth daily.      Historical Provider, MD  carvedilol (COREG) 25 MG tablet Take 1 tablet (25 mg total) by mouth 2 (two) times daily with a meal. 01/01/16   Biagio Borg, MD  Rivaroxaban 15 & 20 MG TBPK Take as directed on package: Start with one 15mg  tablet by mouth twice a day with food. On Day 22, switch to one 20mg  tablet once a day with food. 01/28/16 07/28/16  Nishant Dhungel, MD    Family History Family History  Problem Relation Age of Onset  . Hypertension Other     Social History Social History  Substance Use Topics  . Smoking status: Never Smoker  . Smokeless tobacco: Never Used  . Alcohol use No  Allergies   Lipitor [atorvastatin]; Codeine; Lovastatin; and Pravastatin sodium   Review of Systems Review of Systems  Constitutional: Negative for activity change.  Respiratory: Negative for shortness of breath.   Cardiovascular: Positive for leg swelling. Negative for chest pain.  Gastrointestinal: Negative for abdominal pain.  All other systems reviewed and are negative.    Physical Exam Updated Vital Signs BP 153/61 (BP Location: Left Arm)   Pulse (!) 58    Temp 98.1 F (36.7 C) (Oral)   Resp 20   Ht 5\' 4"  (1.626 m)   Wt 264 lb (119.7 kg)   SpO2 100%   BMI 45.32 kg/m   Physical Exam  Constitutional: She is oriented to person, place, and time. She appears well-developed and well-nourished.  HENT:  Head: Normocephalic and atraumatic.  Eyes: Right eye exhibits no discharge.  Cardiovascular: Normal rate, regular rhythm and normal heart sounds.   No murmur heard. Pulmonary/Chest: Effort normal. She has no wheezes. She has no rales.  On home o2  Abdominal: Soft. She exhibits no distension. There is no tenderness.  Musculoskeletal: She exhibits edema.  Chronic edema bilateral LE  Neurological: She is alert and oriented to person, place, and time. No cranial nerve deficit. Coordination normal.  Left leg may be subtlely weaker than R   Skin: Skin is warm and dry. She is not diaphoretic.  Psychiatric: She has a normal mood and affect.  Nursing note and vitals reviewed.    ED Treatments / Results  Labs (all labs ordered are listed, but only abnormal results are displayed) Labs Reviewed - No data to display  EKG  EKG Interpretation None       Radiology Mr Brain Wo Contrast  Result Date: 02/02/2016 CLINICAL DATA:  82 year old female with worsening memory loss. Prior anterior encephalocele repair and craniotomy 2003. Initial encounter. EXAM: MRI HEAD WITHOUT CONTRAST TECHNIQUE: Multiplanar, multiecho pulse sequences of the brain and surrounding structures were obtained without intravenous contrast. COMPARISON:  None. FINDINGS: Exam is motion degraded. Brain: Acute/subacute small left periatrial infarct without associated hemorrhage. Remote moderate-size right parietal lobe infarct with encephalomalacia. Marked chronic microvascular changes. Remote frontal craniotomy for repair of anterior encephalocele. Significant surrounding encephalomalacia. CSF collection projection into the left ethmoid sinus region. This may represent residua of  prior encephalocele rather than recurrence. There is a structure superior to the right ethmoid sinus air cells which may represent expected postoperative changes with tethering of brain parenchyma to this region. Comparison with prior postoperative exams would be necessary to determine if this represents a change. Moderate global atrophy without hydrocephalus. Separate from the above described findings no intracranial mass is noted. Extended slightly partially empty sella. Vascular: Major intracranial vascular structures are patent. Narrowing of the right vertebral artery. Skull and upper cervical spine: Negative. Sinuses/Orbits: No acute orbital abnormality. Minimal mucosal thickening maxillary sinuses. Other: Negative. IMPRESSION: Acute/subacute small left periatrial infarct without associated hemorrhage. Remote moderate-size right parietal lobe infarct. Marked chronic microvascular changes. Remote frontal craniotomy for repair of anterior encephalocele. Significant surrounding encephalomalacia. CSF collection projection into the left ethmoid sinus region. This may represent residua of prior encephalocele rather than recurrence. There is a structure superior to the right ethmoid sinus air cells which may represent expected postoperative changes with tethering of brain parenchyma to this region. Comparison with prior postoperative exams would be necessary to determine if this represents a change. Moderate global atrophy without hydrocephalus. These results will be called to the ordering clinician or representative by the Radiologist Assistant, and  communication documented in the PACS or zVision Dashboard. Electronically Signed   By: Genia Del M.D.   On: 02/02/2016 17:01    Procedures Procedures (including critical care time)  Medications Ordered in ED Medications - No data to display   Initial Impression / Assessment and Plan / ED Course  I have reviewed the triage vital signs and the nursing  notes.  Pertinent labs & imaging results that were available during my care of the patient were reviewed by me and considered in my medical decision making (see chart for details).  Clinical Course    Patient is a pleasant 82 year old female with history of hypertension hyperlipidemia, pulmonary embolism recently started on Xarlto discontinued aspirin. Patient found to have subacute stroke on MRI as an outpatient. This likely predates her last hospitalization and 10/14. We will touch base with neurology to see if they would change her medical regimen and whether there would be any utility in inpatient stay. She has already had PT OT and has no new neurologic symptoms.   11:32 AM Got in touch with Dr. Shon Hale. He does not recommend any change to medicine (already on xarlto).  Dr. [name] on page- he sent her here for evaulation, but given the work up has been completed- we will touch base with admission vs discharge.   11:50 AM Discussed with his assistant.  Will discharge patietn with neurology follow up and follow up in Dr. Judi Cong office soon.    Final Clinical Impressions(s) / ED Diagnoses   Final diagnoses:  None    New Prescriptions New Prescriptions   No medications on file     Courteney Julio Alm, MD 02/04/16 Elizabeth, MD 02/04/16 1220

## 2016-02-04 NOTE — ED Notes (Signed)
Pt showing weakness in both legs. Daughter states that she has had the same weakness for over a year.

## 2016-02-05 ENCOUNTER — Ambulatory Visit (INDEPENDENT_AMBULATORY_CARE_PROVIDER_SITE_OTHER): Payer: Medicare Other | Admitting: Internal Medicine

## 2016-02-05 ENCOUNTER — Encounter: Payer: Self-pay | Admitting: Internal Medicine

## 2016-02-05 ENCOUNTER — Other Ambulatory Visit (INDEPENDENT_AMBULATORY_CARE_PROVIDER_SITE_OTHER): Payer: Medicare Other

## 2016-02-05 VITALS — BP 136/82 | HR 80 | Temp 98.3°F | Resp 20 | Wt 250.0 lb

## 2016-02-05 DIAGNOSIS — I5032 Chronic diastolic (congestive) heart failure: Secondary | ICD-10-CM

## 2016-02-05 DIAGNOSIS — R21 Rash and other nonspecific skin eruption: Secondary | ICD-10-CM | POA: Diagnosis not present

## 2016-02-05 DIAGNOSIS — I639 Cerebral infarction, unspecified: Secondary | ICD-10-CM

## 2016-02-05 DIAGNOSIS — N183 Chronic kidney disease, stage 3 unspecified: Secondary | ICD-10-CM | POA: Insufficient documentation

## 2016-02-05 DIAGNOSIS — I10 Essential (primary) hypertension: Secondary | ICD-10-CM

## 2016-02-05 LAB — URINALYSIS, ROUTINE W REFLEX MICROSCOPIC
Bilirubin Urine: NEGATIVE
Ketones, ur: NEGATIVE
Nitrite: NEGATIVE
Specific Gravity, Urine: 1.01 (ref 1.000–1.030)
Total Protein, Urine: NEGATIVE
Urine Glucose: NEGATIVE
Urobilinogen, UA: 0.2 (ref 0.0–1.0)
pH: 6 (ref 5.0–8.0)

## 2016-02-05 LAB — BASIC METABOLIC PANEL
BUN: 15 mg/dL (ref 6–23)
CO2: 32 mEq/L (ref 19–32)
Calcium: 9.6 mg/dL (ref 8.4–10.5)
Chloride: 102 mEq/L (ref 96–112)
Creatinine, Ser: 0.98 mg/dL (ref 0.40–1.20)
GFR: 69.3 mL/min (ref >60.00)
Glucose, Bld: 107 mg/dL — ABNORMAL HIGH (ref 70–99)
Potassium: 3.5 mEq/L (ref 3.5–5.1)
Sodium: 141 mEq/L (ref 135–145)

## 2016-02-05 LAB — CBC WITH DIFFERENTIAL/PLATELET
Basophils Absolute: 0 10*3/uL (ref 0.0–0.1)
Basophils Relative: 0.7 % (ref 0.0–3.0)
Eosinophils Absolute: 0.2 10*3/uL (ref 0.0–0.7)
Eosinophils Relative: 4 % (ref 0.0–5.0)
HCT: 38.4 % (ref 36.0–46.0)
Hemoglobin: 12.4 g/dL (ref 12.0–15.0)
Lymphocytes Relative: 24.1 % (ref 12.0–46.0)
Lymphs Abs: 1.5 10*3/uL (ref 0.7–4.0)
MCHC: 32.4 g/dL (ref 30.0–36.0)
MCV: 87.1 fl (ref 78.0–100.0)
Monocytes Absolute: 1.1 10*3/uL — ABNORMAL HIGH (ref 0.1–1.0)
Monocytes Relative: 17.8 % — ABNORMAL HIGH (ref 3.0–12.0)
Neutro Abs: 3.2 10*3/uL (ref 1.4–7.7)
Neutrophils Relative %: 53.4 % (ref 43.0–77.0)
Platelets: 364 10*3/uL (ref 150.0–400.0)
RBC: 4.41 Mil/uL (ref 3.87–5.11)
RDW: 16.4 % — ABNORMAL HIGH (ref 11.5–15.5)
WBC: 6.1 10*3/uL (ref 4.0–10.5)

## 2016-02-05 MED ORDER — RIVAROXABAN 20 MG PO TABS: 20.0000 mg | ORAL_TABLET | Freq: Every day | ORAL | 5 refills | Status: DC

## 2016-02-05 MED ORDER — NYSTATIN 100000 UNIT/GM EX POWD: CUTANEOUS | 1 refills | Status: DC

## 2016-02-05 NOTE — Patient Instructions (Addendum)
.  Please take all new medication as prescribed - the powder  Please continue all other medications as before, including the lasix and benazepril  Please have the pharmacy call with any other refills you may need.  Please keep your appointments with your specialists as you may have planned  Please go to the LAB in the Basement (turn left off the elevator) for the tests to be done today  You will be contacted by phone if any changes need to be made immediately.  Otherwise, you will receive a letter about your results with an explanation, but please check with MyChart first.  Please remember to sign up for MyChart if you have not done so, as this will be important to you in the future with finding out test results, communicating by private email, and scheduling acute appointments online when needed.

## 2016-02-05 NOTE — Assessment & Plan Note (Signed)
C/w intertrigo, for nystatin powder asd,  to f/u any worsening symptoms or concerns

## 2016-02-05 NOTE — Assessment & Plan Note (Addendum)
Stable, with element of venous insufficiency as well to LE's,  to f/u any worsening symptoms or concerns, to restart lasix, for f/u labs today as ordered  Note:  Total time for pt hx, exam, review of record with pt in the room, determination of diagnoses and plan for further eval and tx is > 40 min, with over 50% spent in coordination and counseling of patient

## 2016-02-05 NOTE — Progress Notes (Signed)
Pre visit review using our clinic review tool, if applicable. No additional management support is needed unless otherwise documented below in the visit note. 

## 2016-02-05 NOTE — Assessment & Plan Note (Signed)
Stable, to restart ace, cont all other meds BP Readings from Last 3 Encounters:  02/05/16 136/82  02/04/16 156/67  01/28/16 (!) 148/74

## 2016-02-05 NOTE — Progress Notes (Signed)
Subjective:    Patient ID: Heather [name], female    DOB: [birth year], 82 y.o.   MRN: [id number]  HPI  Here to f/u with daughter for hospn 10/13- 10/17 with bilat LE DVT and PE on new xarelto with planned tx x 6 mo to apr 18/2018; lasix and ACE held at d/c, pt getting [facility] PT and RN, now on chronic home o2 2L for persistent hypoxic resp failure felt due to PE.Marland Kitchen Pt denies chest pain, increased sob or doe, wheezing, orthopnea, PND, increased LE swelling, palpitations, dizziness or syncope, but does have trace blood with cough several few occasions in the past wk.  No other overt bleeding. Still has persistent LE swelling post DVT's, o/w no new complaints.  Did have MRI done as outpt 10/23 with incidental finding acute/subacute MRI non hemorrhagic, seen at ED without neuro changes.Pt cont's to deny neuro complaints today - Pt denies new neurological symptoms such as new headache, or facial or extremity weakness or numbness  AKI improved at last d/c, to cont avoid nsaids, to consider restart lasix/ace today. Denies urinary symptoms such as dysuria, frequency, urgency, flank pain, hematuria or n/v, fever, chills. Does also have intertigro like rash below right breast and right groin, current cream helps but messy. Past Medical History:  Diagnosis Date  . ALLERGIC RHINITIS 12/09/2006  . DEGENERATIVE JOINT DISEASE, LEFT KNEE 01/23/2007  . Encephalocele ([place]) 01/23/2007  . FEVER UNSPECIFIED 01/28/2010  . GERD 12/09/2006  . GLUCOSE INTOLERANCE 01/24/2009  . GOUT 12/09/2006  . HYPERCHOLESTEROLEMIA 12/09/2006  . HYPERLIPIDEMIA 12/18/2006  . HYPERTENSION 12/09/2006  . Impaired glucose tolerance 07/26/2010  . MENOPAUSAL DISORDER 01/25/2008   Past Surgical History:  Procedure Laterality Date  . s/p anterior encephalocele repair and craniotomy  2003    reports that she has never smoked. She has never used smokeless tobacco. She reports that she does not drink alcohol or use drugs. family history includes  Hypertension in her other. Allergies  Allergen Reactions  . Lipitor [Atorvastatin] Other (See Comments)    myalgia  . Codeine Other (See Comments)  . Lovastatin Other (See Comments)  . Pravastatin Sodium Other (See Comments)   Current Outpatient Prescriptions on File Prior to Visit  Medication Sig Dispense Refill  . acetaminophen (TYLENOL) 500 MG tablet Take 500 mg by mouth every 6 (six) hours as needed for moderate pain (knee, leg, stomach).    Marland Kitchen amLODipine (NORVASC) 5 MG tablet Take 1 tablet (5 mg total) by mouth daily. 900 tablet 3  . benazepril (LOTENSIN) 40 MG tablet Take 40 mg by mouth daily.  0  . carvedilol (COREG) 25 MG tablet Take 1 tablet (25 mg total) by mouth 2 (two) times daily with a meal. 180 tablet 3  . furosemide (LASIX) 40 MG tablet Take 40 mg by mouth daily.  0  . Menthol-Zinc Oxide (CALMOSEPTINE) 0.44-20.625 % OINT Apply 1 application topically daily.     No current facility-administered medications on file prior to visit.    Review of Systems Limited due to dementia -   Constitutional: Negative for unusual diaphoresis or night sweats HENT: Negative for ear swelling or discharge Eyes: Negative for worsening visual haziness  Respiratory: Negative for choking and stridor.   Gastrointestinal: Negative for distension or worsening eructation Genitourinary: Negative for retention or change in urine volume.  Musculoskeletal: Negative for other MSK pain Skin: Negative for color change and worsening wound Neurological: Negative for tremors and numbness other than noted  Psychiatric/Behavioral: Negative for decreased concentration  or agitation other than above   All o/w neg per pt    Objective:   Physical Exam BP 136/82   Pulse 80   Temp 98.3 F (36.8 C) (Oral)   Resp 20   Wt 250 lb (113.4 kg)   SpO2 94%   BMI 42.91 kg/m , on 2L contineuou Home o2 VS noted,  Constitutional: Pt appears in no apparent distress HENT: Head: NCAT.  Right Ear: External ear normal.   Left Ear: External ear normal.  Eyes: . Pupils are equal, round, and reactive to light. Conjunctivae and EOM are normal Neck: Normal range of motion. Neck supple.  Cardiovascular: Normal rate and regular rhythm.   Pulmonary/Chest: Effort normal and breath sounds without rales or wheezing.  Abd:  Soft, NT, ND, + BS Neurological: Pt is alert. Not confused , motor 5/5 intact though general weakness Skin: Skin is warm. + intertrigo like rash to area under right breast and right groin Psychiatric: Pt behavior is normal. No agitation.  1+ left > right LE edema    Assessment & Plan:

## 2016-02-05 NOTE — Assessment & Plan Note (Signed)
Likely small vessel, on xarelto, will defer antiplt decision to neurology, has been statin intolerant, for neurology referral

## 2016-02-06 ENCOUNTER — Other Ambulatory Visit: Payer: Self-pay | Admitting: Internal Medicine

## 2016-02-06 ENCOUNTER — Encounter: Payer: Self-pay | Admitting: Internal Medicine

## 2016-02-06 MED ORDER — CEPHALEXIN 500 MG PO CAPS: 500.0000 mg | ORAL_CAPSULE | Freq: Four times a day (QID) | ORAL | 0 refills | Status: AC

## 2016-02-07 DIAGNOSIS — R531 Weakness: Secondary | ICD-10-CM | POA: Diagnosis not present

## 2016-02-07 DIAGNOSIS — R413 Other amnesia: Secondary | ICD-10-CM | POA: Diagnosis not present

## 2016-02-07 DIAGNOSIS — I10 Essential (primary) hypertension: Secondary | ICD-10-CM | POA: Diagnosis not present

## 2016-02-11 DIAGNOSIS — R531 Weakness: Secondary | ICD-10-CM | POA: Diagnosis not present

## 2016-02-11 DIAGNOSIS — R413 Other amnesia: Secondary | ICD-10-CM | POA: Diagnosis not present

## 2016-02-11 DIAGNOSIS — I10 Essential (primary) hypertension: Secondary | ICD-10-CM | POA: Diagnosis not present

## 2016-02-17 ENCOUNTER — Other Ambulatory Visit (HOSPITAL_COMMUNITY): Payer: Medicare Other

## 2016-02-17 ENCOUNTER — Encounter (HOSPITAL_COMMUNITY): Payer: Self-pay | Admitting: Radiology

## 2016-02-17 NOTE — CV Procedure (Signed)
Spoke with Dr. Jenny Reichmann, patient does not need echocardiogram at this time. Patient was made aware.

## 2016-02-18 DIAGNOSIS — R531 Weakness: Secondary | ICD-10-CM | POA: Diagnosis not present

## 2016-02-18 DIAGNOSIS — R413 Other amnesia: Secondary | ICD-10-CM | POA: Diagnosis not present

## 2016-02-18 DIAGNOSIS — I10 Essential (primary) hypertension: Secondary | ICD-10-CM | POA: Diagnosis not present

## 2016-02-19 ENCOUNTER — Telehealth: Payer: Self-pay | Admitting: Emergency Medicine

## 2016-02-19 NOTE — Telephone Encounter (Signed)
Health Care called and wants to know if the pt need to get a follow urine test after taking antibotic? Please advise thanks.

## 2016-02-19 NOTE — Telephone Encounter (Signed)
This is not normally needed unless there are symptoms such as fever or pain that would warrant the testing

## 2016-02-24 ENCOUNTER — Telehealth: Payer: Self-pay | Admitting: Internal Medicine

## 2016-02-24 MED ORDER — RIVAROXABAN 20 MG PO TABS: 20.0000 mg | ORAL_TABLET | Freq: Every day | ORAL | 5 refills | Status: DC

## 2016-02-24 NOTE — Telephone Encounter (Signed)
Refill sent to pharmacy.   

## 2016-02-24 NOTE — Telephone Encounter (Signed)
Please send renewal of xarelto to [facility] on [address]. Patient is down to 2 more pills

## 2016-02-25 DIAGNOSIS — R413 Other amnesia: Secondary | ICD-10-CM | POA: Diagnosis not present

## 2016-02-25 DIAGNOSIS — R531 Weakness: Secondary | ICD-10-CM | POA: Diagnosis not present

## 2016-02-25 DIAGNOSIS — I10 Essential (primary) hypertension: Secondary | ICD-10-CM | POA: Diagnosis not present

## 2016-02-26 DIAGNOSIS — R413 Other amnesia: Secondary | ICD-10-CM | POA: Diagnosis not present

## 2016-02-26 DIAGNOSIS — I10 Essential (primary) hypertension: Secondary | ICD-10-CM | POA: Diagnosis not present

## 2016-02-26 DIAGNOSIS — R531 Weakness: Secondary | ICD-10-CM | POA: Diagnosis not present

## 2016-02-28 DIAGNOSIS — I5032 Chronic diastolic (congestive) heart failure: Secondary | ICD-10-CM | POA: Diagnosis not present

## 2016-02-28 DIAGNOSIS — J9601 Acute respiratory failure with hypoxia: Secondary | ICD-10-CM | POA: Diagnosis not present

## 2016-02-28 DIAGNOSIS — I2699 Other pulmonary embolism without acute cor pulmonale: Secondary | ICD-10-CM | POA: Diagnosis not present

## 2016-03-03 DIAGNOSIS — R413 Other amnesia: Secondary | ICD-10-CM | POA: Diagnosis not present

## 2016-03-03 DIAGNOSIS — I10 Essential (primary) hypertension: Secondary | ICD-10-CM | POA: Diagnosis not present

## 2016-03-03 DIAGNOSIS — R531 Weakness: Secondary | ICD-10-CM | POA: Diagnosis not present

## 2016-03-09 ENCOUNTER — Encounter: Payer: Self-pay | Admitting: Neurology

## 2016-03-09 ENCOUNTER — Ambulatory Visit (INDEPENDENT_AMBULATORY_CARE_PROVIDER_SITE_OTHER): Payer: Medicare Other | Admitting: Neurology

## 2016-03-09 VITALS — BP 144/82 | HR 97 | Wt 249.1 lb

## 2016-03-09 DIAGNOSIS — IMO0002 Reserved for concepts with insufficient information to code with codable children: Secondary | ICD-10-CM | POA: Insufficient documentation

## 2016-03-09 DIAGNOSIS — F0390 Unspecified dementia without behavioral disturbance: Secondary | ICD-10-CM

## 2016-03-09 DIAGNOSIS — I6309 Cerebral infarction due to thrombosis of other precerebral artery: Secondary | ICD-10-CM

## 2016-03-09 DIAGNOSIS — F03A0 Unspecified dementia, mild, without behavioral disturbance, psychotic disturbance, mood disturbance, and anxiety: Secondary | ICD-10-CM | POA: Insufficient documentation

## 2016-03-09 MED ORDER — DONEPEZIL HCL 10 MG PO TABS: ORAL_TABLET | ORAL | 11 refills | Status: DC

## 2016-03-09 NOTE — Progress Notes (Signed)
NEUROLOGY CONSULTATION NOTE  Heather [name] MRN: [id number] DOB: 1928/07/26  Referring provider: Dr. [name] Primary care provider: Dr. [name]  Reason for consult:  Memory loss, recent stroke  Dear Dr Jenny Reichmann:  Thank you for your kind referral of KERRA MERCURI for consultation of the above symptoms. Although her history is well known to you, please allow me to reiterate it for the purpose of our medical record. The patient was accompanied to the clinic by her daughter who also provides collateral information. Records and images were personally reviewed where available.  HISTORY OF PRESENT ILLNESS: This is an 82 year old right-handed woman with a history of hypertension, hyperlipidemia, encephalocele s/p surgery in 2003, presenting for evaluation of memory loss and recent stroke. She had seen her PCP on 01/01/16 for memory problems. An MRI brain was ordered, however while waiting for the scan, she started having shortness of breath and was admitted 01/24/16 and found to have DVTs in both lower extremities. She was also complaining of generalized weakness with difficulty ambulating. She was discharged home on Xarelto and home PT. She went for outpatient previously scheduled MRI brain on 02/03/16, which I personally reviewed, showing an acute/subacute small left periatrial infarct without associated hemorrhage. There was a remote moderate-sized right parietal lobe infarct. There was marked chronic microvascular change. Remote frontal craniotomy for repair of anterior encephalocele was seen, with significant surrounding encephalomalacia. There was moderate global atrophy without hydrocephalus. She was instructed to go to the ER, but since she had recent echocardiogram and was started on Xarelto, and had no clear deficits on exam, and she was discharged home with outpatient follow-up.   She reports that her memory has been "on and off" for conversations. She can hold a conversation then just  "slacks off." Her daughter has noticed this and feels that she loses interest, cutting off conversations. She endorses some word-finding difficulties. Her daughter states that sometimes it is hard to say about her memory because of her "quirky sense of humor." For instance, this morning she said she did not want breakfast. After her daughter came home, she told her she was hungry, so her daughter fixed food. Once made, she stated she did not want food. She only repeats herself when they are having conversations in the bathroom at 3am. She had previously been living with her husband until hospital admission, and has been living with her daughter for the past 2 months. She denied any missed bill payments when she was living with her husband. She is pretty good with remembering to take her morning medications. She stopped driving due to mobility issues 3 years ago. She has been using a wheelchair since recent hospital discharge, however her daughter reports that for the past 2 years, she has not walked a lot because of bilateral leg weakness. She would use a walker and hold on to furniture. She feels her left arm and leg are weaker. She has occasional tingling in both hands. She needs assistance with dressing and bathing. Her daughter reports a couple of falls in the past 2 years, she would tell her daughter about them 1-2 weeks later. She denies any prior history of stroke. She denies any headaches, dizziness, diplopia, dysarthria/dysphagia. No personality changes or hallucinations. She is having some constipation, but her bigger issue is urinary urgency. She has the urge to urinate 4 to 6 times an hour at night, but once she sits on the commode, she does not urinate. During the day, she  has the urge around 3 times an hour. She has recently been treated for a UTI.   Laboratory Data: Lab Results  Component Value Date   WBC 6.1 02/05/2016   HGB 12.4 02/05/2016   HCT 38.4 02/05/2016   MCV 87.1 02/05/2016   PLT  364.0 02/05/2016     Chemistry      Component Value Date/Time   NA 141 02/05/2016 1535   K 3.5 02/05/2016 1535   CL 102 02/05/2016 1535   CO2 32 02/05/2016 1535   BUN 15 02/05/2016 1535   CREATININE 0.98 02/05/2016 1535      Component Value Date/Time   CALCIUM 9.6 02/05/2016 1535   ALKPHOS 62 01/25/2016 0736   AST 23 01/25/2016 0736   ALT 17 01/25/2016 0736   BILITOT 1.0 01/25/2016 0736     Lab Results  Component Value Date   TSH 1.485 01/25/2016   Lab Results  Component Value Date   [id number] 01/01/2016     PAST MEDICAL HISTORY: Past Medical History:  Diagnosis Date  . ALLERGIC RHINITIS 12/09/2006  . DEGENERATIVE JOINT DISEASE, LEFT KNEE 01/23/2007  . Encephalocele ([place]) 01/23/2007  . FEVER UNSPECIFIED 01/28/2010  . GERD 12/09/2006  . GLUCOSE INTOLERANCE 01/24/2009  . GOUT 12/09/2006  . HYPERCHOLESTEROLEMIA 12/09/2006  . HYPERLIPIDEMIA 12/18/2006  . HYPERTENSION 12/09/2006  . Impaired glucose tolerance 07/26/2010  . MENOPAUSAL DISORDER 01/25/2008    PAST SURGICAL HISTORY: Past Surgical History:  Procedure Laterality Date  . s/p anterior encephalocele repair and craniotomy  2003    MEDICATIONS: Current Outpatient Prescriptions on File Prior to Visit  Medication Sig Dispense Refill  . acetaminophen (TYLENOL) 500 MG tablet Take 500 mg by mouth every 6 (six) hours as needed for moderate pain (knee, leg, stomach).    Marland Kitchen amLODipine (NORVASC) 5 MG tablet Take 1 tablet (5 mg total) by mouth daily. 900 tablet 3  . benazepril (LOTENSIN) 40 MG tablet Take 40 mg by mouth daily.  0  . carvedilol (COREG) 25 MG tablet Take 1 tablet (25 mg total) by mouth 2 (two) times daily with a meal. 180 tablet 3  . furosemide (LASIX) 40 MG tablet Take 40 mg by mouth daily.  0  . Menthol-Zinc Oxide (CALMOSEPTINE) 0.44-20.625 % OINT Apply 1 application topically daily.    Marland Kitchen nystatin (MYCOSTATIN/NYSTOP) powder Use as directed twice per day as needed 45 g 1  . rivaroxaban (XARELTO) 20 MG  TABS tablet Take 1 tablet (20 mg total) by mouth daily with supper. 30 tablet 5   No current facility-administered medications on file prior to visit.     ALLERGIES: Allergies  Allergen Reactions  . Lipitor [Atorvastatin] Other (See Comments)    myalgia  . Codeine Other (See Comments)  . Lovastatin Other (See Comments)  . Pravastatin Sodium Other (See Comments)    FAMILY HISTORY: Family History  Problem Relation Age of Onset  . Hypertension Other     SOCIAL HISTORY: Social History   Social History  . Marital status: Married    Spouse name: N/A  . Number of children: 7  . Years of education: N/A   Occupational History  . Beauty operateo/hair stylist    Social History Main Topics  . Smoking status: Never Smoker  . Smokeless tobacco: Never Used  . Alcohol use No  . Drug use: No  . Sexual activity: Not on file   Other Topics Concern  . Not on file   Social History Narrative  . No narrative on  file    REVIEW OF SYSTEMS: Constitutional: No fevers, chills, or sweats, no generalized fatigue, change in appetite Eyes: No visual changes, double vision, eye pain Ear, nose and throat: No hearing loss, ear pain, nasal congestion, sore throat Cardiovascular: No chest pain, palpitations Respiratory:  No shortness of breath at rest or with exertion, wheezes GastrointestinaI: No nausea, vomiting, diarrhea, abdominal pain, fecal incontinence Genitourinary:  No dysuria, urinary retention or frequency Musculoskeletal:  No neck pain,+ back pain Integumentary: No rash, pruritus, skin lesions Neurological: as above Psychiatric: No depression, insomnia, anxiety Endocrine: No palpitations, fatigue, diaphoresis, mood swings, change in appetite, change in weight, increased thirst Hematologic/Lymphatic:  No anemia, purpura, petechiae. Allergic/Immunologic: no itchy/runny eyes, nasal congestion, recent allergic reactions, rashes  PHYSICAL EXAM: Vitals:   03/09/16 1049  BP: (!)  144/82  Pulse: 97   General: No acute distress, sitting on wheelchair Head:  Normocephalic/atraumatic Eyes: Fundoscopic exam shows bilateral sharp discs, no vessel changes, exudates, or hemorrhages Neck: supple, no paraspinal tenderness, full range of motion Back: No paraspinal tenderness Heart: regular rate and rhythm Lungs: Clear to auscultation bilaterally. Vascular: No carotid bruits. Skin/Extremities: No rash, no edema Neurological Exam: Mental status: alert and oriented to person, place, and month/year, no dysarthria or aphasia, Fund of knowledge is appropriate.  Recent and remote memory are intact.  Attention and concentration are normal.    Able to name objects and repeat phrases. CDT 1/5  MMSE - Mini Mental State Exam 03/09/2016  Orientation to time 3  Orientation to Place 5  Registration 3  Attention/ Calculation 0  Recall 1  Language- name 2 objects 2  Language- repeat 1  Language- follow 3 step command 3  Language- read & follow direction 1  Write a sentence 0  Copy design 0  Total score 19   Cranial nerves: CN I: not tested CN II: pupils equal, round and reactive to light, visual fields intact, fundi unremarkable. CN III, IV, VI:  full range of motion, no nystagmus, no ptosis CN V: facial sensation intact CN VII: upper and lower face symmetric CN VIII: hearing intact to finger rub CN IX, X: gag intact, uvula midline CN XI: sternocleidomastoid and trapezius muscles intact CN XII: tongue midline Bulk & Tone: normal, no fasciculations. Motor: 5/5 throughout with no pronator drift. Sensation: decreased pin on right UE and LE, decreased cold on left LE, intact vibration and joint position sense.  Deep Tendon Reflexes: unable to elicit reflexes throughout, no ankle clonus Plantar responses: downgoing bilaterally Cerebellar: no incoordination on finger to nose testing Gait: not tested, patient in wheelchair Tremor: none  IMPRESSION: This is an 82 year old  right-handed woman with a history of hypertension, hyperlipidemia, encephalocele s/p surgery in 2003, presenting for evaluation of memory loss and recent stroke. Her MMSE today is 19/30, indicating mild dementia. Neurological exam shows subjective sensory changes, no clear focal weakness. MRI brain showed acute/subacute left periatrial stroke, and remote right parietal stroke. There was encephalomalacia in the bilateral frontal lobes, as well as significant microvascular disease. Dementia likely vascular in etiology, she is agreeable to starting Aricept, side effects and expectations from the medication were discussed. Stroke likely due to small vessel disease, echo unremarkable. She is now on anticoagulation with Xarelto for DVTs, we discussed control of vascular risk factors for secondary stroke prevention. They know to go to the ER immediately for any change in symptoms. We discussed the importance of control of vascular risk factors, physical exercise, and brain stimulation exercises for  brain health. She is currently doing PT twice a week, continue HEP. She will follow-up in 6 months and knows to call for any changes.   Thank you for allowing me to participate in the care of this patient. Please do not hesitate to call for any questions or concerns.   Ellouise Newer, M.D.  CC: Dr. Jenny Reichmann

## 2016-03-09 NOTE — Patient Instructions (Signed)
1. Start Aricept 10mg : Take 1/2 tablet daily for 1 month, then increase to 1 tablet daily 2. Continue with all your medications 3. Continue physical therapy 4. If symptoms change, go to ER immediately 5. Follow-up in 6 months, call for any changes

## 2016-03-11 DIAGNOSIS — I10 Essential (primary) hypertension: Secondary | ICD-10-CM | POA: Diagnosis not present

## 2016-03-11 DIAGNOSIS — R531 Weakness: Secondary | ICD-10-CM | POA: Diagnosis not present

## 2016-03-11 DIAGNOSIS — R413 Other amnesia: Secondary | ICD-10-CM | POA: Diagnosis not present

## 2016-03-13 ENCOUNTER — Telehealth: Payer: Self-pay | Admitting: Internal Medicine

## 2016-03-13 NOTE — Telephone Encounter (Signed)
Please consider ROV

## 2016-03-13 NOTE — Telephone Encounter (Signed)
Patient's caretaker [name] walked in...  - regarding the referral to [facility] for an EKG; this was cancelled because it was done in her hospital visit on 02/04/2016. Order was placed in September. Should the patient have another one?   - patient is experiencing extreme urgency urinating at night. [name] describes it as 15-20 times per night regularly. She is not doing this during the day, and only goes 5-6 normally. The night urgency is keeping both of them from sleeping. Has been occurring for 1 mo+. Does the patient need a visit, or will you prescribe on this?

## 2016-03-13 NOTE — Telephone Encounter (Signed)
Daughter will call back to schedule.

## 2016-03-17 ENCOUNTER — Ambulatory Visit (INDEPENDENT_AMBULATORY_CARE_PROVIDER_SITE_OTHER): Payer: Medicare Other | Admitting: Internal Medicine

## 2016-03-17 ENCOUNTER — Encounter: Payer: Self-pay | Admitting: Internal Medicine

## 2016-03-17 ENCOUNTER — Other Ambulatory Visit (INDEPENDENT_AMBULATORY_CARE_PROVIDER_SITE_OTHER): Payer: Medicare Other

## 2016-03-17 VITALS — BP 140/80 | HR 90 | Resp 20 | Wt 249.0 lb

## 2016-03-17 DIAGNOSIS — R351 Nocturia: Secondary | ICD-10-CM | POA: Diagnosis not present

## 2016-03-17 DIAGNOSIS — R7302 Impaired glucose tolerance (oral): Secondary | ICD-10-CM | POA: Diagnosis not present

## 2016-03-17 DIAGNOSIS — I10 Essential (primary) hypertension: Secondary | ICD-10-CM

## 2016-03-17 DIAGNOSIS — E785 Hyperlipidemia, unspecified: Secondary | ICD-10-CM

## 2016-03-17 MED ORDER — NYSTATIN 100000 UNIT/GM EX POWD: CUTANEOUS | 5 refills | Status: DC

## 2016-03-17 NOTE — Progress Notes (Signed)
Pre visit review using our clinic review tool, if applicable. No additional management support is needed unless otherwise documented below in the visit note. 

## 2016-03-17 NOTE — Progress Notes (Signed)
Subjective:    Patient ID: Heather [name], female    DOB: 1928-06-18, 82 y.o.   MRN: [id number]  HPI  Here to f/u; overall doing ok,  Pt denies chest pain, increasing sob or doe, wheezing, orthopnea, PND, increased LE swelling, palpitations, dizziness or syncope.  Pt denies new neurological symptoms such as new headache, or facial or extremity weakness or numbness.  Pt denies polydipsia, polyuria, or low sugar episode.   Pt denies new neurological symptoms such as new headache, or facial or extremity weakness or numbness.   Pt states overall good compliance with meds, mostly trying to follow appropriate diet, with wt overall stable,  but little exercise however/.  No other new hx. Wt Readings from Last 3 Encounters:  03/17/16 249 lb (112.9 kg)  03/09/16 249 lb 1 oz (113 kg)  02/05/16 250 lb (113.4 kg)  Does also have 2 wks onset nocturia 1-2 per night that is unsusual for her. Denies urinary symptoms such as dysuria, frequency, urgency, flank pain, hematuria or n/v, fever, chills. Past Medical History:  Diagnosis Date  . ALLERGIC RHINITIS 12/09/2006  . DEGENERATIVE JOINT DISEASE, LEFT KNEE 01/23/2007  . Encephalocele ([place]) 01/23/2007  . FEVER UNSPECIFIED 01/28/2010  . GERD 12/09/2006  . GLUCOSE INTOLERANCE 01/24/2009  . GOUT 12/09/2006  . HYPERCHOLESTEROLEMIA 12/09/2006  . HYPERLIPIDEMIA 12/18/2006  . HYPERTENSION 12/09/2006  . Impaired glucose tolerance 07/26/2010  . MENOPAUSAL DISORDER 01/25/2008   Past Surgical History:  Procedure Laterality Date  . s/p anterior encephalocele repair and craniotomy  2003    reports that she has never smoked. She has never used smokeless tobacco. She reports that she does not drink alcohol or use drugs. family history includes Hypertension in her other. Allergies  Allergen Reactions  . Lipitor [Atorvastatin] Other (See Comments)    myalgia  . Codeine Other (See Comments)  . Lovastatin Other (See Comments)  . Pravastatin Sodium Other (See Comments)    Current Outpatient Prescriptions on File Prior to Visit  Medication Sig Dispense Refill  . acetaminophen (TYLENOL) 500 MG tablet Take 500 mg by mouth every 6 (six) hours as needed for moderate pain (knee, leg, stomach).    Marland Kitchen amLODipine (NORVASC) 5 MG tablet Take 1 tablet (5 mg total) by mouth daily. 900 tablet 3  . benazepril (LOTENSIN) 40 MG tablet Take 40 mg by mouth daily.  0  . carvedilol (COREG) 25 MG tablet Take 1 tablet (25 mg total) by mouth 2 (two) times daily with a meal. 180 tablet 3  . donepezil (ARICEPT) 10 MG tablet Take 1/2 tablet daily for 1 month, then increase to 1 tablet daily 30 tablet 11  . furosemide (LASIX) 40 MG tablet Take 40 mg by mouth daily.  0  . Menthol-Zinc Oxide (CALMOSEPTINE) 0.44-20.625 % OINT Apply 1 application topically daily.    . rivaroxaban (XARELTO) 20 MG TABS tablet Take 1 tablet (20 mg total) by mouth daily with supper. 30 tablet 5   No current facility-administered medications on file prior to visit.    Review of Systems  Constitutional: Negative for unusual diaphoresis or night sweats HENT: Negative for ear swelling or discharge Eyes: Negative for worsening visual haziness  Respiratory: Negative for choking and stridor.   Gastrointestinal: Negative for distension or worsening eructation Genitourinary: Negative for retention or change in urine volume.  Musculoskeletal: Negative for other MSK pain or swelling Skin: Negative for color change and worsening wound Neurological: Negative for tremors and numbness other than noted  Psychiatric/Behavioral: Negative  for decreased concentration or agitation other than above   All other system neg per pt    Objective:   Physical Exam BP 140/80   Pulse 90   Resp 20   Wt 249 lb (112.9 kg)   SpO2 96%   BMI 42.74 kg/m  VS noted,  Constitutional: Pt appears in no apparent distress HENT: Head: NCAT.  Right Ear: External ear normal.  Left Ear: External ear normal.  Eyes: . Pupils are equal, round,  and reactive to light. Conjunctivae and EOM are normal Neck: Normal range of motion. Neck supple.  Cardiovascular: Normal rate and regular rhythm.   Pulmonary/Chest: Effort normal and breath sounds without rales or wheezing.  Abd:  Soft, NT, ND, + BS, no flank tender Neurological: Pt is alert. Not confused , motor grossly intact Skin: Skin is warm. No rash, no LE edema Psychiatric: Pt behavior is normal. No agitation.  No other new exam findings    Assessment & Plan:

## 2016-03-17 NOTE — Patient Instructions (Signed)

## 2016-03-18 ENCOUNTER — Encounter: Payer: Self-pay | Admitting: Internal Medicine

## 2016-03-18 ENCOUNTER — Other Ambulatory Visit: Payer: Self-pay | Admitting: Internal Medicine

## 2016-03-18 LAB — URINALYSIS, ROUTINE W REFLEX MICROSCOPIC
Bilirubin Urine: NEGATIVE
Hgb urine dipstick: NEGATIVE
Ketones, ur: NEGATIVE
Nitrite: NEGATIVE
RBC / HPF: NONE SEEN (ref 0–?)
Specific Gravity, Urine: 1.02 (ref 1.000–1.030)
Total Protein, Urine: NEGATIVE
Urine Glucose: NEGATIVE
Urobilinogen, UA: 0.2 (ref 0.0–1.0)
pH: 5.5 (ref 5.0–8.0)

## 2016-03-18 LAB — URINE CULTURE

## 2016-03-18 MED ORDER — CEPHALEXIN 500 MG PO CAPS: 500.0000 mg | ORAL_CAPSULE | Freq: Four times a day (QID) | ORAL | 0 refills | Status: AC

## 2016-03-22 NOTE — Assessment & Plan Note (Signed)
Uncontrolled but o/w stable overall by history and exam, recent data reviewed with pt, and pt to continue medical treatment as before with diet as has been statin intolerant,  to f/u any worsening symptoms or concerns

## 2016-03-22 NOTE — Assessment & Plan Note (Signed)
stable overall by history and exam, recent data reviewed with pt, and pt to continue medical treatment as before,  to f/u any worsening symptoms or concerns Lab Results  Component Value Date   HGBA1C 6.2 01/01/2016

## 2016-03-22 NOTE — Assessment & Plan Note (Signed)
stable overall by history and exam, recent data reviewed with pt, and pt to continue medical treatment as before,  to f/u any worsening symptoms or concerns BP Readings from Last 3 Encounters:  03/17/16 140/80  03/09/16 (!) 144/82  02/05/16 136/82

## 2016-03-22 NOTE — Assessment & Plan Note (Signed)
?   uTI - for Urine studies,  to f/u any worsening symptoms or concerns

## 2016-03-29 DIAGNOSIS — J9601 Acute respiratory failure with hypoxia: Secondary | ICD-10-CM | POA: Diagnosis not present

## 2016-03-29 DIAGNOSIS — I2699 Other pulmonary embolism without acute cor pulmonale: Secondary | ICD-10-CM | POA: Diagnosis not present

## 2016-03-29 DIAGNOSIS — I5032 Chronic diastolic (congestive) heart failure: Secondary | ICD-10-CM | POA: Diagnosis not present

## 2016-04-16 DIAGNOSIS — R413 Other amnesia: Secondary | ICD-10-CM | POA: Diagnosis not present

## 2016-04-16 DIAGNOSIS — J810 Acute pulmonary edema: Secondary | ICD-10-CM | POA: Diagnosis not present

## 2016-04-16 DIAGNOSIS — I10 Essential (primary) hypertension: Secondary | ICD-10-CM | POA: Diagnosis not present

## 2016-04-16 DIAGNOSIS — I5032 Chronic diastolic (congestive) heart failure: Secondary | ICD-10-CM | POA: Diagnosis not present

## 2016-04-16 DIAGNOSIS — M1710 Unilateral primary osteoarthritis, unspecified knee: Secondary | ICD-10-CM | POA: Diagnosis not present

## 2016-04-16 DIAGNOSIS — R531 Weakness: Secondary | ICD-10-CM | POA: Diagnosis not present

## 2016-04-17 ENCOUNTER — Telehealth: Payer: Self-pay | Admitting: Internal Medicine

## 2016-04-17 MED ORDER — CITALOPRAM HYDROBROMIDE 10 MG PO TABS: 10.0000 mg | ORAL_TABLET | Freq: Every day | ORAL | 3 refills | Status: DC

## 2016-04-17 NOTE — Telephone Encounter (Signed)
Spoke to patient's daughter she is aware

## 2016-04-17 NOTE — Telephone Encounter (Signed)
Pt is not sleeping at night and is crying a lot though out the day.  What can be done about this ?

## 2016-04-17 NOTE — Telephone Encounter (Signed)
Ok for celexa 10 qd 

## 2016-04-21 DIAGNOSIS — R413 Other amnesia: Secondary | ICD-10-CM | POA: Diagnosis not present

## 2016-04-21 DIAGNOSIS — M1710 Unilateral primary osteoarthritis, unspecified knee: Secondary | ICD-10-CM | POA: Diagnosis not present

## 2016-04-21 DIAGNOSIS — I5032 Chronic diastolic (congestive) heart failure: Secondary | ICD-10-CM | POA: Diagnosis not present

## 2016-04-21 DIAGNOSIS — J810 Acute pulmonary edema: Secondary | ICD-10-CM | POA: Diagnosis not present

## 2016-04-21 DIAGNOSIS — I10 Essential (primary) hypertension: Secondary | ICD-10-CM | POA: Diagnosis not present

## 2016-04-21 DIAGNOSIS — R531 Weakness: Secondary | ICD-10-CM | POA: Diagnosis not present

## 2016-04-28 DIAGNOSIS — I5032 Chronic diastolic (congestive) heart failure: Secondary | ICD-10-CM | POA: Diagnosis not present

## 2016-04-28 DIAGNOSIS — R413 Other amnesia: Secondary | ICD-10-CM | POA: Diagnosis not present

## 2016-04-28 DIAGNOSIS — M1710 Unilateral primary osteoarthritis, unspecified knee: Secondary | ICD-10-CM | POA: Diagnosis not present

## 2016-04-28 DIAGNOSIS — I10 Essential (primary) hypertension: Secondary | ICD-10-CM | POA: Diagnosis not present

## 2016-04-28 DIAGNOSIS — R531 Weakness: Secondary | ICD-10-CM | POA: Diagnosis not present

## 2016-04-28 DIAGNOSIS — J810 Acute pulmonary edema: Secondary | ICD-10-CM | POA: Diagnosis not present

## 2016-04-29 DIAGNOSIS — I2699 Other pulmonary embolism without acute cor pulmonale: Secondary | ICD-10-CM | POA: Diagnosis not present

## 2016-04-29 DIAGNOSIS — I5032 Chronic diastolic (congestive) heart failure: Secondary | ICD-10-CM | POA: Diagnosis not present

## 2016-04-29 DIAGNOSIS — J9601 Acute respiratory failure with hypoxia: Secondary | ICD-10-CM | POA: Diagnosis not present

## 2016-05-04 ENCOUNTER — Telehealth: Payer: Self-pay | Admitting: Internal Medicine

## 2016-05-04 NOTE — Telephone Encounter (Addendum)
Pt was sent home from the hospital with oxygen, she no longer needs it. [facility] will not pick it up until they have a discontinue order from you. Could we fax?  Also you rx'd something recently for anxiety and Neuro started her on Aricept. Daughter is concerned with her taking both and wants to be sure you are comfortable with this. Pt seems to be very focused on her disabilities, daughter had a difficult time trying to express her concerns with any specifics.  [name] is going to contact pts insurance about possible future Home Health.  Debra/daughter [phone]

## 2016-05-05 DIAGNOSIS — I5032 Chronic diastolic (congestive) heart failure: Secondary | ICD-10-CM | POA: Diagnosis not present

## 2016-05-05 DIAGNOSIS — I10 Essential (primary) hypertension: Secondary | ICD-10-CM | POA: Diagnosis not present

## 2016-05-05 DIAGNOSIS — R413 Other amnesia: Secondary | ICD-10-CM | POA: Diagnosis not present

## 2016-05-05 DIAGNOSIS — J810 Acute pulmonary edema: Secondary | ICD-10-CM | POA: Diagnosis not present

## 2016-05-05 DIAGNOSIS — R531 Weakness: Secondary | ICD-10-CM | POA: Diagnosis not present

## 2016-05-05 DIAGNOSIS — M1710 Unilateral primary osteoarthritis, unspecified knee: Secondary | ICD-10-CM | POA: Diagnosis not present

## 2016-05-05 NOTE — Telephone Encounter (Signed)
We normally cannot stop oxygen until tested off the oxygen while at rest and with walking  Has this been done?  If not, then I cannot order the stop oxygen, and pt will need ROV

## 2016-05-06 NOTE — Telephone Encounter (Signed)
Left message for daughter with MD response.

## 2016-05-07 DIAGNOSIS — R413 Other amnesia: Secondary | ICD-10-CM | POA: Diagnosis not present

## 2016-05-07 DIAGNOSIS — J810 Acute pulmonary edema: Secondary | ICD-10-CM

## 2016-05-07 DIAGNOSIS — R531 Weakness: Secondary | ICD-10-CM | POA: Diagnosis not present

## 2016-05-07 DIAGNOSIS — I5032 Chronic diastolic (congestive) heart failure: Secondary | ICD-10-CM | POA: Diagnosis not present

## 2016-05-07 DIAGNOSIS — I10 Essential (primary) hypertension: Secondary | ICD-10-CM | POA: Diagnosis not present

## 2016-05-12 DIAGNOSIS — J810 Acute pulmonary edema: Secondary | ICD-10-CM | POA: Diagnosis not present

## 2016-05-12 DIAGNOSIS — M1710 Unilateral primary osteoarthritis, unspecified knee: Secondary | ICD-10-CM | POA: Diagnosis not present

## 2016-05-12 DIAGNOSIS — R413 Other amnesia: Secondary | ICD-10-CM | POA: Diagnosis not present

## 2016-05-12 DIAGNOSIS — I5032 Chronic diastolic (congestive) heart failure: Secondary | ICD-10-CM | POA: Diagnosis not present

## 2016-05-12 DIAGNOSIS — R531 Weakness: Secondary | ICD-10-CM | POA: Diagnosis not present

## 2016-05-12 DIAGNOSIS — I10 Essential (primary) hypertension: Secondary | ICD-10-CM | POA: Diagnosis not present

## 2016-05-12 DIAGNOSIS — R2681 Unsteadiness on feet: Secondary | ICD-10-CM | POA: Diagnosis not present

## 2016-05-19 DIAGNOSIS — R531 Weakness: Secondary | ICD-10-CM | POA: Diagnosis not present

## 2016-05-19 DIAGNOSIS — M1710 Unilateral primary osteoarthritis, unspecified knee: Secondary | ICD-10-CM | POA: Diagnosis not present

## 2016-05-19 DIAGNOSIS — J810 Acute pulmonary edema: Secondary | ICD-10-CM | POA: Diagnosis not present

## 2016-05-19 DIAGNOSIS — I5032 Chronic diastolic (congestive) heart failure: Secondary | ICD-10-CM | POA: Diagnosis not present

## 2016-05-19 DIAGNOSIS — R2681 Unsteadiness on feet: Secondary | ICD-10-CM | POA: Diagnosis not present

## 2016-05-19 DIAGNOSIS — R413 Other amnesia: Secondary | ICD-10-CM | POA: Diagnosis not present

## 2016-05-19 DIAGNOSIS — I10 Essential (primary) hypertension: Secondary | ICD-10-CM | POA: Diagnosis not present

## 2016-05-21 DIAGNOSIS — I10 Essential (primary) hypertension: Secondary | ICD-10-CM | POA: Diagnosis not present

## 2016-05-21 DIAGNOSIS — I5032 Chronic diastolic (congestive) heart failure: Secondary | ICD-10-CM | POA: Diagnosis not present

## 2016-05-21 DIAGNOSIS — M1710 Unilateral primary osteoarthritis, unspecified knee: Secondary | ICD-10-CM | POA: Diagnosis not present

## 2016-05-21 DIAGNOSIS — R413 Other amnesia: Secondary | ICD-10-CM | POA: Diagnosis not present

## 2016-05-21 DIAGNOSIS — J810 Acute pulmonary edema: Secondary | ICD-10-CM | POA: Diagnosis not present

## 2016-05-21 DIAGNOSIS — R531 Weakness: Secondary | ICD-10-CM | POA: Diagnosis not present

## 2016-05-21 DIAGNOSIS — R2681 Unsteadiness on feet: Secondary | ICD-10-CM | POA: Diagnosis not present

## 2016-05-26 DIAGNOSIS — I5032 Chronic diastolic (congestive) heart failure: Secondary | ICD-10-CM | POA: Diagnosis not present

## 2016-05-26 DIAGNOSIS — M1710 Unilateral primary osteoarthritis, unspecified knee: Secondary | ICD-10-CM | POA: Diagnosis not present

## 2016-05-26 DIAGNOSIS — I10 Essential (primary) hypertension: Secondary | ICD-10-CM | POA: Diagnosis not present

## 2016-05-26 DIAGNOSIS — R531 Weakness: Secondary | ICD-10-CM | POA: Diagnosis not present

## 2016-05-26 DIAGNOSIS — R2681 Unsteadiness on feet: Secondary | ICD-10-CM | POA: Diagnosis not present

## 2016-05-26 DIAGNOSIS — J810 Acute pulmonary edema: Secondary | ICD-10-CM | POA: Diagnosis not present

## 2016-05-26 DIAGNOSIS — R413 Other amnesia: Secondary | ICD-10-CM | POA: Diagnosis not present

## 2016-05-30 DIAGNOSIS — I5032 Chronic diastolic (congestive) heart failure: Secondary | ICD-10-CM | POA: Diagnosis not present

## 2016-05-30 DIAGNOSIS — J9601 Acute respiratory failure with hypoxia: Secondary | ICD-10-CM | POA: Diagnosis not present

## 2016-05-30 DIAGNOSIS — I2699 Other pulmonary embolism without acute cor pulmonale: Secondary | ICD-10-CM | POA: Diagnosis not present

## 2016-06-02 DIAGNOSIS — M1710 Unilateral primary osteoarthritis, unspecified knee: Secondary | ICD-10-CM | POA: Diagnosis not present

## 2016-06-02 DIAGNOSIS — R531 Weakness: Secondary | ICD-10-CM | POA: Diagnosis not present

## 2016-06-02 DIAGNOSIS — I5032 Chronic diastolic (congestive) heart failure: Secondary | ICD-10-CM | POA: Diagnosis not present

## 2016-06-02 DIAGNOSIS — I10 Essential (primary) hypertension: Secondary | ICD-10-CM | POA: Diagnosis not present

## 2016-06-02 DIAGNOSIS — R413 Other amnesia: Secondary | ICD-10-CM | POA: Diagnosis not present

## 2016-06-02 DIAGNOSIS — J810 Acute pulmonary edema: Secondary | ICD-10-CM | POA: Diagnosis not present

## 2016-06-02 DIAGNOSIS — R2681 Unsteadiness on feet: Secondary | ICD-10-CM | POA: Diagnosis not present

## 2016-06-04 DIAGNOSIS — R413 Other amnesia: Secondary | ICD-10-CM | POA: Diagnosis not present

## 2016-06-04 DIAGNOSIS — M1710 Unilateral primary osteoarthritis, unspecified knee: Secondary | ICD-10-CM | POA: Diagnosis not present

## 2016-06-04 DIAGNOSIS — I10 Essential (primary) hypertension: Secondary | ICD-10-CM | POA: Diagnosis not present

## 2016-06-04 DIAGNOSIS — R2681 Unsteadiness on feet: Secondary | ICD-10-CM | POA: Diagnosis not present

## 2016-06-04 DIAGNOSIS — R531 Weakness: Secondary | ICD-10-CM | POA: Diagnosis not present

## 2016-06-04 DIAGNOSIS — J810 Acute pulmonary edema: Secondary | ICD-10-CM | POA: Diagnosis not present

## 2016-06-04 DIAGNOSIS — I5032 Chronic diastolic (congestive) heart failure: Secondary | ICD-10-CM | POA: Diagnosis not present

## 2016-06-09 DIAGNOSIS — R2681 Unsteadiness on feet: Secondary | ICD-10-CM | POA: Diagnosis not present

## 2016-06-09 DIAGNOSIS — R413 Other amnesia: Secondary | ICD-10-CM | POA: Diagnosis not present

## 2016-06-09 DIAGNOSIS — R531 Weakness: Secondary | ICD-10-CM | POA: Diagnosis not present

## 2016-06-09 DIAGNOSIS — J810 Acute pulmonary edema: Secondary | ICD-10-CM | POA: Diagnosis not present

## 2016-06-09 DIAGNOSIS — I5032 Chronic diastolic (congestive) heart failure: Secondary | ICD-10-CM | POA: Diagnosis not present

## 2016-06-09 DIAGNOSIS — M1710 Unilateral primary osteoarthritis, unspecified knee: Secondary | ICD-10-CM | POA: Diagnosis not present

## 2016-06-09 DIAGNOSIS — I10 Essential (primary) hypertension: Secondary | ICD-10-CM | POA: Diagnosis not present

## 2016-06-11 DIAGNOSIS — R531 Weakness: Secondary | ICD-10-CM | POA: Diagnosis not present

## 2016-06-12 ENCOUNTER — Encounter: Payer: Self-pay | Admitting: Internal Medicine

## 2016-06-12 ENCOUNTER — Ambulatory Visit (INDEPENDENT_AMBULATORY_CARE_PROVIDER_SITE_OTHER): Payer: Medicare Other | Admitting: Internal Medicine

## 2016-06-12 ENCOUNTER — Other Ambulatory Visit (INDEPENDENT_AMBULATORY_CARE_PROVIDER_SITE_OTHER): Payer: Medicare Other

## 2016-06-12 VITALS — BP 142/82 | HR 56 | Temp 98.1°F | Ht 61.0 in | Wt 238.0 lb

## 2016-06-12 DIAGNOSIS — F0391 Unspecified dementia with behavioral disturbance: Secondary | ICD-10-CM

## 2016-06-12 DIAGNOSIS — E785 Hyperlipidemia, unspecified: Secondary | ICD-10-CM

## 2016-06-12 DIAGNOSIS — I639 Cerebral infarction, unspecified: Secondary | ICD-10-CM

## 2016-06-12 DIAGNOSIS — J9611 Chronic respiratory failure with hypoxia: Secondary | ICD-10-CM | POA: Diagnosis not present

## 2016-06-12 DIAGNOSIS — R7302 Impaired glucose tolerance (oral): Secondary | ICD-10-CM | POA: Diagnosis not present

## 2016-06-12 LAB — BASIC METABOLIC PANEL
BUN: 20 mg/dL (ref 6–23)
CO2: 32 mEq/L (ref 19–32)
Calcium: 9.4 mg/dL (ref 8.4–10.5)
Chloride: 102 mEq/L (ref 96–112)
Creatinine, Ser: 0.88 mg/dL (ref 0.40–1.20)
GFR: 78.39 mL/min (ref >60.00)
Glucose, Bld: 118 mg/dL — ABNORMAL HIGH (ref 70–99)
Potassium: 4 mEq/L (ref 3.5–5.1)
Sodium: 139 mEq/L (ref 135–145)

## 2016-06-12 LAB — LIPID PANEL
Cholesterol: 221 mg/dL — ABNORMAL HIGH (ref 0–200)
HDL: 37.2 mg/dL — ABNORMAL LOW (ref >39.00)
LDL Cholesterol: 145 mg/dL — ABNORMAL HIGH (ref 0–99)
NonHDL: 183.82
Total CHOL/HDL Ratio: 6
Triglycerides: 195 mg/dL — ABNORMAL HIGH (ref 0.0–149.0)
VLDL: 39 mg/dL (ref 0.0–40.0)

## 2016-06-12 LAB — HEMOGLOBIN A1C: Hgb A1c MFr Bld: 6.2 % (ref 4.6–6.5)

## 2016-06-12 LAB — CBC WITH DIFFERENTIAL/PLATELET
Basophils Absolute: 0.1 10*3/uL (ref 0.0–0.1)
Basophils Relative: 1.1 % (ref 0.0–3.0)
Eosinophils Absolute: 0.3 10*3/uL (ref 0.0–0.7)
Eosinophils Relative: 5 % (ref 0.0–5.0)
HCT: 40.6 % (ref 36.0–46.0)
Hemoglobin: 13 g/dL (ref 12.0–15.0)
Lymphocytes Relative: 33.6 % (ref 12.0–46.0)
Lymphs Abs: 1.8 10*3/uL (ref 0.7–4.0)
MCHC: 32.1 g/dL (ref 30.0–36.0)
MCV: 85.5 fl (ref 78.0–100.0)
Monocytes Absolute: 0.8 10*3/uL (ref 0.1–1.0)
Monocytes Relative: 14.4 % — ABNORMAL HIGH (ref 3.0–12.0)
Neutro Abs: 2.5 10*3/uL (ref 1.4–7.7)
Neutrophils Relative %: 45.9 % (ref 43.0–77.0)
Platelets: 315 10*3/uL (ref 150.0–400.0)
RBC: 4.75 Mil/uL (ref 3.87–5.11)
RDW: 15.7 % — ABNORMAL HIGH (ref 11.5–15.5)
WBC: 5.4 10*3/uL (ref 4.0–10.5)

## 2016-06-12 LAB — TSH: TSH: 2.97 u[IU]/mL (ref 0.35–4.50)

## 2016-06-12 LAB — HEPATIC FUNCTION PANEL
ALT: 9 U/L (ref 0–35)
AST: 13 U/L (ref 0–37)
Albumin: 3.4 g/dL — ABNORMAL LOW (ref 3.5–5.2)
Alkaline Phosphatase: 65 U/L (ref 39–117)
Bilirubin, Direct: 0.1 mg/dL (ref 0.0–0.3)
Total Bilirubin: 0.5 mg/dL (ref 0.2–1.2)
Total Protein: 7 g/dL (ref 6.0–8.3)

## 2016-06-12 MED ORDER — QUETIAPINE FUMARATE 50 MG PO TABS
ORAL_TABLET | ORAL | 3 refills | Status: DC
Start: 2016-06-12 — End: 2016-09-25

## 2016-06-12 NOTE — Progress Notes (Signed)
Subjective:    Patient ID: Heather [name], female    DOB: 03-06-1929, 83 y.o.   MRN: [id number]  HPI  Here with daughter, pt did not take celexa due to intolerance with GI upset after a few doses only, Denies worsening depressive symptoms, suicidal ideation, or panic; has ongoing anxiety, not increased recently.  Does not want to retry.  Daughter asks to stop the qhs home o2 as pt has not used for 2 mo, but has not yet been evaluated such as overnight oximetry on RA, also asks for Home Health help due to worsening behavior, anxiety, agitation and occasional hallucinations. Has had 2 panic attacks requiring EMS in past 2 wks  Daughter denies pt with new neurological symptoms such as new headache, or facial or extremity weakness or numbness   Pt denies polydipsia, polyuria,  Past Medical History:  Diagnosis Date  . ALLERGIC RHINITIS 12/09/2006  . DEGENERATIVE JOINT DISEASE, LEFT KNEE 01/23/2007  . Encephalocele ([place]) 01/23/2007  . FEVER UNSPECIFIED 01/28/2010  . GERD 12/09/2006  . GLUCOSE INTOLERANCE 01/24/2009  . GOUT 12/09/2006  . HYPERCHOLESTEROLEMIA 12/09/2006  . HYPERLIPIDEMIA 12/18/2006  . HYPERTENSION 12/09/2006  . Impaired glucose tolerance 07/26/2010  . MENOPAUSAL DISORDER 01/25/2008   Past Surgical History:  Procedure Laterality Date  . s/p anterior encephalocele repair and craniotomy  2003    reports that she has never smoked. She has never used smokeless tobacco. She reports that she does not drink alcohol or use drugs. family history includes Hypertension in her other. Allergies  Allergen Reactions  . Lipitor [Atorvastatin] Other (See Comments)    myalgia  . Codeine Other (See Comments)  . Lovastatin Other (See Comments)  . Pravastatin Sodium Other (See Comments)   Current Outpatient Prescriptions on File Prior to Visit  Medication Sig Dispense Refill  . acetaminophen (TYLENOL) 500 MG tablet Take 500 mg by mouth every 6 (six) hours as needed for moderate pain (knee, leg,  stomach).    Marland Kitchen amLODipine (NORVASC) 5 MG tablet Take 1 tablet (5 mg total) by mouth daily. 900 tablet 3  . benazepril (LOTENSIN) 40 MG tablet Take 40 mg by mouth daily.  0  . carvedilol (COREG) 25 MG tablet Take 1 tablet (25 mg total) by mouth 2 (two) times daily with a meal. 180 tablet 3  . donepezil (ARICEPT) 10 MG tablet Take 1/2 tablet daily for 1 month, then increase to 1 tablet daily 30 tablet 11  . furosemide (LASIX) 40 MG tablet Take 40 mg by mouth daily.  0  . Menthol-Zinc Oxide (CALMOSEPTINE) 0.44-20.625 % OINT Apply 1 application topically daily.    Marland Kitchen nystatin (MYCOSTATIN/NYSTOP) powder Use as directed twice per day as needed 56.7 g 5  . rivaroxaban (XARELTO) 20 MG TABS tablet Take 1 tablet (20 mg total) by mouth daily with supper. 30 tablet 5   No current facility-administered medications on file prior to visit.     Review of Systems  Unable to do secondary to dementia, daughter states all other system neg     Objective:   Physical Exam BP (!) 142/82   Pulse (!) 56   Temp 98.1 F (36.7 C)   Ht 5\' 1"  (1.549 m)   Wt 238 lb (108 kg)   SpO2 97%   BMI 44.97 kg/m  VS noted, morbid obese Constitutional: Pt appears in no apparent distress HENT: Head: NCAT.  Right Ear: External ear normal.  Left Ear: External ear normal.  Eyes: . Pupils are equal, round,  and reactive to light. Conjunctivae and EOM are normal Neck: Normal range of motion. Neck supple.  Cardiovascular: Normal rate and regular rhythm.   Pulmonary/Chest: Effort normal and breath sounds without rales or wheezing.  Abd:  Soft, NT, ND, + BS Neurological: Pt is alert. At baseline confused , motor grossly intact Skin: Skin is warm. No rash, no LE edema Psychiatric: Pt behavior is normal. No agitation. + mild nervous No other exam findings    Assessment & Plan:

## 2016-06-12 NOTE — Patient Instructions (Signed)
Please take all new medication as prescribed - the seroquel  You will be contacted regarding the referral for: [place], [facility], and the overnight oximetry (checking the oxygen at night test)  Please continue all other medications as before, and refills have been done if requested.  Please have the pharmacy call with any other refills you may need.  Please keep your appointments with your specialists as you may have planned  Please go to the LAB in the Basement (turn left off the elevator) for the tests to be done today  You will be contacted by phone if any changes need to be made immediately.  Otherwise, you will receive a letter about your results with an explanation, but please check with MyChart first.  Please remember to sign up for MyChart if you have not done so, as this will be important to you in the future with finding out test results, communicating by private email, and scheduling acute appointments online when needed.  Please return in 6 months, or sooner if needed

## 2016-06-13 NOTE — Assessment & Plan Note (Signed)
stable overall by history and exam, recent data reviewed with pt, and pt to continue medical treatment as before,  to f/u any worsening symptoms or concerns Lab Results  Component Value Date   HGBA1C 6.2 06/12/2016    

## 2016-06-13 NOTE — Assessment & Plan Note (Signed)
Stable, for [facility] with RN and PT, also will see if qualifies for [facility] services

## 2016-06-13 NOTE — Assessment & Plan Note (Signed)
Ok for home oximetry on RA to assess need for further qhs home o2

## 2016-06-13 NOTE — Assessment & Plan Note (Addendum)
Ok to start seroquel 50 - 100 qhs , family would like to avoid am dosing if possible  Note:  Total time for pt hx, exam, review of record with pt in the room, determination of diagnoses and plan for further eval and tx is > 40 min, with over 50% spent in coordination and counseling of patient

## 2016-06-13 NOTE — Assessment & Plan Note (Signed)
stable overall by history and exam, and pt to continue medical treatment as before,  to f/u any worsening symptoms or concerns, for f/u labs 

## 2016-06-15 ENCOUNTER — Telehealth: Payer: Self-pay | Admitting: *Deleted

## 2016-06-15 NOTE — Telephone Encounter (Signed)
Daughter left msg on triage early this AM stating the medciation MD rx on Friday requires a prior authorization. Rec'd PA this afternoon completed on cover-my-meds. Rec'g msgn Marianny Bazaldua (Key: [facility])  Your information has been submitted to [place]. [employer] [place] will review the request and notify you of the determination decision directly, typically within 3 business days of your submission and once all necessary information is received....Heather [name]

## 2016-06-16 ENCOUNTER — Encounter: Payer: Self-pay | Admitting: Internal Medicine

## 2016-06-16 NOTE — Telephone Encounter (Signed)
Rec'd call from [facility].[employer] needed more clinical information. Gave info, and just received msg stating  Soraya Nichter (Key: [facility])  This request has been approved. [employer] [place] will send a letter to the member and you regarding this decision.

## 2016-06-16 NOTE — Telephone Encounter (Signed)
Notified pt daughter (Mrs. Glynda Jaeger) inform her PA has been approved and to check w/pharmacy for the amount of copay...Johny Chess

## 2016-06-16 NOTE — Telephone Encounter (Signed)
I received a call from [facility] at [facility] stating that the  QUEtiapine (SEROQUEL) 50 MG tablet was approved as of March 5th, 2018 for one year. Thanks [employer]

## 2016-06-24 ENCOUNTER — Telehealth: Payer: Self-pay | Admitting: Internal Medicine

## 2016-06-24 NOTE — Telephone Encounter (Signed)
duaghter has made appt to bring mom in tomorrow to see charlotte nche----home health nurse, Estill Bamberg also advised

## 2016-06-24 NOTE — Telephone Encounter (Signed)
Not sure what to make of this  Please ask pt to make return office visit

## 2016-06-24 NOTE — Telephone Encounter (Signed)
Routing to dr john, please advise, I will call home health back, thanks 

## 2016-06-24 NOTE — Telephone Encounter (Signed)
Patient has wheezing in both lower lobs and HR is 106.  Patient is having issues sleeping.  Patient was put on quetiapine.  States patient got up to taking 2 tabs but states started to make her hyper.  States went back down to one tab and its not helping any.

## 2016-06-25 ENCOUNTER — Encounter: Payer: Self-pay | Admitting: Nurse Practitioner

## 2016-06-25 ENCOUNTER — Ambulatory Visit (INDEPENDENT_AMBULATORY_CARE_PROVIDER_SITE_OTHER): Payer: Medicare Other | Admitting: Nurse Practitioner

## 2016-06-25 VITALS — BP 142/80 | HR 53 | Temp 98.6°F

## 2016-06-25 DIAGNOSIS — G4700 Insomnia, unspecified: Secondary | ICD-10-CM

## 2016-06-25 DIAGNOSIS — R350 Frequency of micturition: Secondary | ICD-10-CM | POA: Diagnosis not present

## 2016-06-25 MED ORDER — TEMAZEPAM 7.5 MG PO CAPS: 7.5000 mg | ORAL_CAPSULE | Freq: Every evening | ORAL | 0 refills | Status: DC | PRN

## 2016-06-25 NOTE — Patient Instructions (Signed)

## 2016-06-25 NOTE — Progress Notes (Signed)
Pre visit review using our clinic review tool, if applicable. No additional management support is needed unless otherwise documented below in the visit note. 

## 2016-06-25 NOTE — Progress Notes (Signed)
Subjective:  Patient ID: [name], female    DOB: [birth year]  Age: 83 y.o. MRN: [id number]  CC: Follow-up (keep on getting up at night,cant sleep.quetiapine doesnt help. wheezing)   Insomnia  Primary symptoms: fragmented sleep, sleep disturbance, no difficulty falling asleep, somnolence, frequent awakening, no premature morning awakening, malaise/fatigue, napping.   The current episode started more than one month. The onset quality is gradual. The problem occurs nightly. The problem has been gradually improving (with use of seroquel) since onset. The symptoms are aggravated by anxiety (and increased urinary frequency). How many beverages per day that contain caffeine: 0 - 1.  Types of beverages you drink: tea. Past treatments include medication (seroquel). The treatment provided mild relief. Typical bedtime:  Other (5-6pm).  How long after going to bed to you fall asleep: 15-30 minutes.   Duration of naps:  Other (15-55mins).  PMH includes: hypertension.  Ms. Shailyn in room with daughter who thinks fragmented sleep is due to increased urinary frequency, but she is hesitant about using a antispasmodic due to possible adverse effects. Daughter has noticed increased urinary frequency since CVA in 2017. daughter denies any change in mental status since last OV with pcp. Ms. Cortni denies any constipation or diarrhea or dysuria or ABD pain or fever or chills. Last urinalysis and culture done 03/2016: treated with oral abx.  Outpatient Medications Prior to Visit  Medication Sig Dispense Refill  . acetaminophen (TYLENOL) 500 MG tablet Take 500 mg by mouth every 6 (six) hours as needed for moderate pain (knee, leg, stomach).    Marland Kitchen amLODipine (NORVASC) 5 MG tablet Take 1 tablet (5 mg total) by mouth daily. 900 tablet 3  . benazepril (LOTENSIN) 40 MG tablet Take 40 mg by mouth daily.  0  . carvedilol (COREG) 25 MG tablet Take 1 tablet (25 mg total) by mouth 2 (two) times daily with a meal. 180 tablet 3    . donepezil (ARICEPT) 10 MG tablet Take 1/2 tablet daily for 1 month, then increase to 1 tablet daily 30 tablet 11  . furosemide (LASIX) 40 MG tablet Take 40 mg by mouth daily.  0  . Menthol-Zinc Oxide (CALMOSEPTINE) 0.44-20.625 % OINT Apply 1 application topically daily.    Marland Kitchen nystatin (MYCOSTATIN/NYSTOP) powder Use as directed twice per day as needed 56.7 g 5  . QUEtiapine (SEROQUEL) 50 MG tablet 1-2 tab by mouth at bedtime 180 tablet 3  . rivaroxaban (XARELTO) 20 MG TABS tablet Take 1 tablet (20 mg total) by mouth daily with supper. 30 tablet 5   No facility-administered medications prior to visit.     ROS See HPI  Objective:  BP (!) 142/80   Pulse (!) 53   Temp 98.6 F (37 C)   SpO2 94%   BP Readings from Last 3 Encounters:  06/25/16 (!) 142/80  06/12/16 (!) 142/82  03/17/16 140/80    Wt Readings from Last 3 Encounters:  06/12/16 238 lb (108 kg)  03/17/16 249 lb (112.9 kg)  03/09/16 249 lb 1 oz (113 kg)    Physical Exam  Constitutional: She is oriented to person, place, and time. No distress.  Cardiovascular: Normal rate and normal heart sounds.   Pulmonary/Chest: Effort normal and breath sounds normal. No respiratory distress. She has no wheezes. She has no rales.  Abdominal: Soft. Bowel sounds are normal. She exhibits no distension.  Musculoskeletal: She exhibits edema.  Neurological: She is alert and oriented to person, place, and time.  Vitals reviewed.  Lab Results  Component Value Date   WBC 5.4 06/12/2016   HGB 13.0 06/12/2016   HCT 40.6 06/12/2016   PLT 315.0 06/12/2016   GLUCOSE 118 (H) 06/12/2016   CHOL 221 (H) 06/12/2016   TRIG 195.0 (H) 06/12/2016   HDL 37.20 (L) 06/12/2016   LDLDIRECT 139.0 01/01/2016   LDLCALC 145 (H) 06/12/2016   ALT 9 06/12/2016   AST 13 06/12/2016   NA 139 06/12/2016   K 4.0 06/12/2016   CL 102 06/12/2016   CREATININE 0.88 06/12/2016   BUN 20 06/12/2016   CO2 32 06/12/2016   TSH 2.97 06/12/2016   HGBA1C 6.2  06/12/2016    No results found.  Assessment & Plan:   Obie was seen today for follow-up.  Diagnoses and all orders for this visit:  Insomnia, unspecified type -     temazepam (RESTORIL) 7.5 MG capsule; Take 1 capsule (7.5 mg total) by mouth at bedtime as needed for sleep.  Urinary frequency   I am having Ms. Fabio Neighbors start on temazepam. I am also having her maintain her amLODipine, carvedilol, benazepril, furosemide, acetaminophen, Menthol-Zinc Oxide, rivaroxaban, donepezil, nystatin, and QUEtiapine.  Meds ordered this encounter  Medications  . temazepam (RESTORIL) 7.5 MG capsule    Sig: Take 1 capsule (7.5 mg total) by mouth at bedtime as needed for sleep.    Dispense:  30 capsule    Refill:  0    Order Specific Question:   Supervising Provider    Answer:   Cassandria Anger [[id number]]    Follow-up: Return in about 2 weeks (around 07/09/2016) for insomnia and urinary frequency.  Wilfred Lacy, NP

## 2016-06-27 DIAGNOSIS — J9601 Acute respiratory failure with hypoxia: Secondary | ICD-10-CM | POA: Diagnosis not present

## 2016-06-27 DIAGNOSIS — I2699 Other pulmonary embolism without acute cor pulmonale: Secondary | ICD-10-CM | POA: Diagnosis not present

## 2016-06-27 DIAGNOSIS — I5032 Chronic diastolic (congestive) heart failure: Secondary | ICD-10-CM | POA: Diagnosis not present

## 2016-07-01 ENCOUNTER — Telehealth: Payer: Self-pay | Admitting: Nurse Practitioner

## 2016-07-01 DIAGNOSIS — G4700 Insomnia, unspecified: Secondary | ICD-10-CM

## 2016-07-01 NOTE — Telephone Encounter (Signed)
PA denied from [facility].   Charlotte please advise.

## 2016-07-01 NOTE — Telephone Encounter (Signed)
Received PA request from [place] [facility] [address]. Insurance is not covering for this med. PA started ( Key: [id number]) waiting for response.   Charlotte please advise, do you want to change it to something else.

## 2016-07-03 MED ORDER — HYDROXYZINE HCL 25 MG PO TABS: 25.0000 mg | ORAL_TABLET | Freq: Every evening | ORAL | 0 refills | Status: DC | PRN

## 2016-07-03 MED ORDER — HYDROXYZINE HCL 25 MG PO TABS: 25.0000 mg | ORAL_TABLET | Freq: Three times a day (TID) | ORAL | 0 refills | Status: DC | PRN

## 2016-07-03 NOTE — Telephone Encounter (Signed)
Called daughter [name], need to inform her that Jill Side is not cover by insurance, so Baldo Ash change this med to [employer].   Can not leave vm, will try again

## 2016-07-03 NOTE — Telephone Encounter (Signed)
Changed medication to vistaril at bedtime prn

## 2016-07-06 ENCOUNTER — Encounter: Payer: Self-pay | Admitting: Internal Medicine

## 2016-07-06 NOTE — Telephone Encounter (Signed)
Spoke with [name] (on DPR). She is aware of massage below.

## 2016-07-07 ENCOUNTER — Encounter: Payer: Self-pay | Admitting: Internal Medicine

## 2016-07-13 DIAGNOSIS — I5032 Chronic diastolic (congestive) heart failure: Secondary | ICD-10-CM | POA: Diagnosis not present

## 2016-07-13 DIAGNOSIS — R413 Other amnesia: Secondary | ICD-10-CM | POA: Diagnosis not present

## 2016-07-13 DIAGNOSIS — J810 Acute pulmonary edema: Secondary | ICD-10-CM | POA: Diagnosis not present

## 2016-07-13 DIAGNOSIS — I10 Essential (primary) hypertension: Secondary | ICD-10-CM | POA: Diagnosis not present

## 2016-07-13 DIAGNOSIS — R531 Weakness: Secondary | ICD-10-CM | POA: Diagnosis not present

## 2016-07-14 ENCOUNTER — Telehealth: Payer: Self-pay | Admitting: Internal Medicine

## 2016-07-14 NOTE — Telephone Encounter (Signed)
Shirron to contact pt -   We received the results of the overnight oximetry, and the oxygen was low much of the night  Patient needs to continue the home oxygen at night only  - 2L, and needs to wear this every night

## 2016-07-15 DIAGNOSIS — R413 Other amnesia: Secondary | ICD-10-CM | POA: Diagnosis not present

## 2016-07-15 DIAGNOSIS — J810 Acute pulmonary edema: Secondary | ICD-10-CM | POA: Diagnosis not present

## 2016-07-15 DIAGNOSIS — R531 Weakness: Secondary | ICD-10-CM | POA: Diagnosis not present

## 2016-07-15 DIAGNOSIS — I5032 Chronic diastolic (congestive) heart failure: Secondary | ICD-10-CM | POA: Diagnosis not present

## 2016-07-15 DIAGNOSIS — I10 Essential (primary) hypertension: Secondary | ICD-10-CM | POA: Diagnosis not present

## 2016-07-15 NOTE — Telephone Encounter (Signed)
Patients cargiver, daughter, was given the results and expressed understanding.

## 2016-07-28 DIAGNOSIS — J9601 Acute respiratory failure with hypoxia: Secondary | ICD-10-CM | POA: Diagnosis not present

## 2016-07-28 DIAGNOSIS — I2699 Other pulmonary embolism without acute cor pulmonale: Secondary | ICD-10-CM | POA: Diagnosis not present

## 2016-07-28 DIAGNOSIS — I5032 Chronic diastolic (congestive) heart failure: Secondary | ICD-10-CM | POA: Diagnosis not present

## 2016-08-03 ENCOUNTER — Other Ambulatory Visit: Payer: Self-pay | Admitting: Nurse Practitioner

## 2016-08-03 DIAGNOSIS — G4700 Insomnia, unspecified: Secondary | ICD-10-CM

## 2016-08-12 ENCOUNTER — Ambulatory Visit: Payer: Medicare Other | Admitting: Internal Medicine

## 2016-08-13 ENCOUNTER — Ambulatory Visit (INDEPENDENT_AMBULATORY_CARE_PROVIDER_SITE_OTHER): Payer: Medicare Other | Admitting: Internal Medicine

## 2016-08-13 ENCOUNTER — Encounter: Payer: Self-pay | Admitting: Internal Medicine

## 2016-08-13 VITALS — BP 116/64 | HR 68

## 2016-08-13 DIAGNOSIS — I5032 Chronic diastolic (congestive) heart failure: Secondary | ICD-10-CM

## 2016-08-13 DIAGNOSIS — R300 Dysuria: Secondary | ICD-10-CM

## 2016-08-13 DIAGNOSIS — G4700 Insomnia, unspecified: Secondary | ICD-10-CM | POA: Diagnosis not present

## 2016-08-13 DIAGNOSIS — I10 Essential (primary) hypertension: Secondary | ICD-10-CM | POA: Diagnosis not present

## 2016-08-13 MED ORDER — CEPHALEXIN 500 MG PO CAPS: 500.0000 mg | ORAL_CAPSULE | Freq: Three times a day (TID) | ORAL | 0 refills | Status: AC

## 2016-08-13 MED ORDER — HYDROXYZINE HCL 25 MG PO TABS: 25.0000 mg | ORAL_TABLET | Freq: Every evening | ORAL | 2 refills | Status: DC | PRN

## 2016-08-13 NOTE — Patient Instructions (Signed)
Please take all new medication as prescribed - the antibiotic  The hydroxyine refill was sent to the pharmacy, but if this is not covered, she could take Benadryl 50 mg at bedtime instead (OTC)  Please continue all other medications as before, and refills have been done if requested.  Please have the pharmacy call with any other refills you may need.  Please keep your appointments with your specialists as you may have planned

## 2016-08-13 NOTE — Progress Notes (Signed)
Pre visit review using our clinic review tool, if applicable. No additional management support is needed unless otherwise documented below in the visit note. 

## 2016-08-13 NOTE — Progress Notes (Signed)
Subjective:    Patient ID: Heather [name], female    DOB: 09-Sep-1928, 83 y.o.   MRN: [id number]  HPI  Here with family who gives the hx due to pt dementia;  Pt has had acute onset 3-4 days unusual urinary freq, pain with urination, sense of intermittent retention but also incontinence worsening, also with occasional loose stools, has felt warm at times, but o/w Denies worsening confusion, or urinary symptoms such as urgency, flank pain, hematuria or n/v, or chills. Pt denies chest pain, increased sob or doe, wheezing, orthopnea, PND, increased LE swelling, palpitations, dizziness or syncope.  Pt denies new neurological symptoms such as new headache, or facial or extremity weakness or numbness  Also with recent worsening insomnia, starting to get days and nights mixed up Past Medical History:  Diagnosis Date  . ALLERGIC RHINITIS 12/09/2006  . DEGENERATIVE JOINT DISEASE, LEFT KNEE 01/23/2007  . Encephalocele ([place]) 01/23/2007  . FEVER UNSPECIFIED 01/28/2010  . GERD 12/09/2006  . GLUCOSE INTOLERANCE 01/24/2009  . GOUT 12/09/2006  . HYPERCHOLESTEROLEMIA 12/09/2006  . HYPERLIPIDEMIA 12/18/2006  . HYPERTENSION 12/09/2006  . Impaired glucose tolerance 07/26/2010  . MENOPAUSAL DISORDER 01/25/2008   Past Surgical History:  Procedure Laterality Date  . s/p anterior encephalocele repair and craniotomy  2003    reports that she has never smoked. She has never used smokeless tobacco. She reports that she does not drink alcohol or use drugs. family history includes Hypertension in her other. Allergies  Allergen Reactions  . Lipitor [Atorvastatin] Other (See Comments)    myalgia  . Codeine Other (See Comments)  . Lovastatin Other (See Comments)  . Pravastatin Sodium Other (See Comments)   Current Outpatient Prescriptions on File Prior to Visit  Medication Sig Dispense Refill  . acetaminophen (TYLENOL) 500 MG tablet Take 500 mg by mouth every 6 (six) hours as needed for moderate pain (knee, leg,  stomach).    Marland Kitchen amLODipine (NORVASC) 5 MG tablet Take 1 tablet (5 mg total) by mouth daily. 900 tablet 3  . benazepril (LOTENSIN) 40 MG tablet Take 40 mg by mouth daily.  0  . carvedilol (COREG) 25 MG tablet Take 1 tablet (25 mg total) by mouth 2 (two) times daily with a meal. 180 tablet 3  . donepezil (ARICEPT) 10 MG tablet Take 1/2 tablet daily for 1 month, then increase to 1 tablet daily 30 tablet 11  . furosemide (LASIX) 40 MG tablet Take 40 mg by mouth daily.  0  . Menthol-Zinc Oxide (CALMOSEPTINE) 0.44-20.625 % OINT Apply 1 application topically daily.    Marland Kitchen nystatin (MYCOSTATIN/NYSTOP) powder Use as directed twice per day as needed 56.7 g 5  . QUEtiapine (SEROQUEL) 50 MG tablet 1-2 tab by mouth at bedtime 180 tablet 3  . rivaroxaban (XARELTO) 20 MG TABS tablet Take 1 tablet (20 mg total) by mouth daily with supper. 30 tablet 5   No current facility-administered medications on file prior to visit.    Review of Systems Unable due to dementia    Objective:   Physical Exam BP 116/64   Pulse 68   SpO2 98%  VS noted, , mild ill appearing Constitutional: Pt appears in NAD HENT: Head: NCAT.  Right Ear: External ear normal.  Left Ear: External ear normal.  Eyes: . Pupils are equal, round, and reactive to light. Conjunctivae and EOM are normal Nose: without d/c or deformity Neck: Neck supple. Gross normal ROM Cardiovascular: Normal rate and regular rhythm.   Pulmonary/Chest: Effort normal and breath  sounds without rales or wheezing.  Abd:  Soft, NT, ND, + BS, no organomegaly Neurological: Pt is alert. At baseline mod confusion, motor grossly intact Skin: Skin is warm. No rashes, other new lesions, no LE edema Psychiatric: Pt behavior is normal without agitation  No other exam findings    Assessment & Plan:

## 2016-08-16 DIAGNOSIS — R300 Dysuria: Secondary | ICD-10-CM | POA: Insufficient documentation

## 2016-08-16 DIAGNOSIS — G4700 Insomnia, unspecified: Secondary | ICD-10-CM | POA: Insufficient documentation

## 2016-08-16 NOTE — Assessment & Plan Note (Signed)
stable overall by history and exam, recent data reviewed with pt, and pt to continue medical treatment as before,  to f/u any worsening symptoms or concerns BP Readings from Last 3 Encounters:  08/13/16 116/64  06/25/16 (!) 142/80  06/12/16 (!) 142/82

## 2016-08-16 NOTE — Assessment & Plan Note (Signed)
stable overall by history and exam, and pt to continue medical treatment as before,  to f/u any worsening symptoms or concerns 

## 2016-08-16 NOTE — Assessment & Plan Note (Signed)
[place] for atarax refill for bedtime, though could consider benadryl otc if not covered with insurance

## 2016-08-16 NOTE — Assessment & Plan Note (Signed)
This is c/w probable uti, for urine studies, empiric antibx,  to f/u any worsening symptoms or concerns

## 2016-08-27 DIAGNOSIS — I5032 Chronic diastolic (congestive) heart failure: Secondary | ICD-10-CM | POA: Diagnosis not present

## 2016-08-27 DIAGNOSIS — I2699 Other pulmonary embolism without acute cor pulmonale: Secondary | ICD-10-CM | POA: Diagnosis not present

## 2016-08-27 DIAGNOSIS — J9601 Acute respiratory failure with hypoxia: Secondary | ICD-10-CM | POA: Diagnosis not present

## 2016-09-08 ENCOUNTER — Encounter: Payer: Self-pay | Admitting: Neurology

## 2016-09-08 ENCOUNTER — Ambulatory Visit (INDEPENDENT_AMBULATORY_CARE_PROVIDER_SITE_OTHER): Payer: Medicare Other | Admitting: Neurology

## 2016-09-08 VITALS — BP 128/72 | HR 52 | Ht 61.0 in | Wt 250.0 lb

## 2016-09-08 DIAGNOSIS — F0390 Unspecified dementia without behavioral disturbance: Secondary | ICD-10-CM | POA: Diagnosis not present

## 2016-09-08 DIAGNOSIS — F03A0 Unspecified dementia, mild, without behavioral disturbance, psychotic disturbance, mood disturbance, and anxiety: Secondary | ICD-10-CM

## 2016-09-08 MED ORDER — DONEPEZIL HCL 10 MG PO TABS: ORAL_TABLET | ORAL | 3 refills | Status: DC

## 2016-09-08 NOTE — Progress Notes (Signed)
NEUROLOGY FOLLOW UP OFFICE NOTE  Heather [name] [id number] 02-26-1929  HISTORY OF PRESENT ILLNESS: I had the pleasure of seeing Heather [name] in follow-up in the neurology clinic on 09/08/2016.  The patient was last seen 6 months ago for mild dementia and is again accompanied by her daughter who helps supplement the history today.  Records and images were personally reviewed where available. MMSE in November 2017 was 19/30. She was started on Aricept 10mg  daily , which she is tolerating without side effects. Her daughter reports that memory has been slowly worsening, in the past 2 weeks she asks the same questions. She is constantly asking what time it is, getting confused in the evening if there is still light outside. She can stay up all night thinking it was daytime. This had improved slightly when she took Seroquel, but 100mg  dose made her too drowsy to do bathroom transfers. She needs help with dressing and bathing. She can feed herself but her daughter has noticed her hands are not as nimble. She has a lot of urinary urgency, and even with Depends she wants to use the bathroom every 30-40 minutes at night. She only urinates 60% of the time that she goes to the commode. Her daughter reports some behavioral component, during the daytime when her other sisters watch her, she can go up to more than 2 hours without going to the bathroom, sleeping more in the daytime. She is constantly hungry, sometimes eating dinner three times. She had some hallucinations on combination Celexa and Seroquel, no further hallucinations on Seroquel alone. She denies any headaches, dizziness, focal numbness/tingling. She has had 4 falls since her last visit.   HPI 03/09/2016: This is an 83 yo RH woman with a history of hypertension, hyperlipidemia, encephalocele s/p surgery in 2003, with memory loss and stroke in October 2017. She had seen her PCP on 01/01/16 for memory problems. An MRI brain was ordered, however while waiting  for the scan, she started having shortness of breath and was admitted 01/24/16 and found to have DVTs in both lower extremities. She was also complaining of generalized weakness with difficulty ambulating. She was discharged home on Xarelto and home PT. She went for outpatient previously scheduled MRI brain on 02/03/16, which I personally reviewed, showing an acute/subacute small left periatrial infarct without associated hemorrhage. There was a remote moderate-sized right parietal lobe infarct. There was marked chronic microvascular change. Remote frontal craniotomy for repair of anterior encephalocele was seen, with significant surrounding encephalomalacia. There was moderate global atrophy without hydrocephalus. She was instructed to go to the ER, but since she had recent echocardiogram and was started on Xarelto, and had no clear deficits on exam, and she was discharged home with outpatient follow-up.   She reports that her memory has been "on and off" for conversations. She can hold a conversation then just "slacks off." Her daughter has noticed this and feels that she loses interest, cutting off conversations. She endorses some word-finding difficulties. Her daughter states that sometimes it is hard to say about her memory because of her "quirky sense of humor." For instance, this morning she said she did not want breakfast. After her daughter came home, she told her she was hungry, so her daughter fixed food. Once made, she stated she did not want food. She only repeats herself when they are having conversations in the bathroom at 3am. She had previously been living with her husband until hospital admission, and has been living with her daughter  for the past 2 months. She denied any missed bill payments when she was living with her husband. She is pretty good with remembering to take her morning medications. She stopped driving due to mobility issues 3 years ago. She has been using a wheelchair since  recent hospital discharge, however her daughter reports that for the past 2 years, she has not walked a lot because of bilateral leg weakness. She would use a walker and hold on to furniture. She feels her left arm and leg are weaker. She has occasional tingling in both hands. She needs assistance with dressing and bathing. Her daughter reports a couple of falls in the past 2 years, she would tell her daughter about them 1-2 weeks later. She denies any prior history of stroke. She denies any headaches, dizziness, diplopia, dysarthria/dysphagia. No personality changes or hallucinations. She is having some constipation, but her bigger issue is urinary urgency. She has the urge to urinate 4 to 6 times an hour at night, but once she sits on the commode, she does not urinate. During the day, she has the urge around 3 times an hour.   PAST MEDICAL HISTORY: Past Medical History:  Diagnosis Date  . ALLERGIC RHINITIS 12/09/2006  . DEGENERATIVE JOINT DISEASE, LEFT KNEE 01/23/2007  . Encephalocele ([place]) 01/23/2007  . FEVER UNSPECIFIED 01/28/2010  . GERD 12/09/2006  . GLUCOSE INTOLERANCE 01/24/2009  . GOUT 12/09/2006  . HYPERCHOLESTEROLEMIA 12/09/2006  . HYPERLIPIDEMIA 12/18/2006  . HYPERTENSION 12/09/2006  . Impaired glucose tolerance 07/26/2010  . MENOPAUSAL DISORDER 01/25/2008    MEDICATIONS: Current Outpatient Prescriptions on File Prior to Visit  Medication Sig Dispense Refill  . acetaminophen (TYLENOL) 500 MG tablet Take 500 mg by mouth every 6 (six) hours as needed for moderate pain (knee, leg, stomach).    Marland Kitchen amLODipine (NORVASC) 5 MG tablet Take 1 tablet (5 mg total) by mouth daily. 900 tablet 3  . benazepril (LOTENSIN) 40 MG tablet Take 40 mg by mouth daily.  0  . carvedilol (COREG) 25 MG tablet Take 1 tablet (25 mg total) by mouth 2 (two) times daily with a meal. 180 tablet 3  . donepezil (ARICEPT) 10 MG tablet Take 1/2 tablet daily for 1 month, then increase to 1 tablet daily 30 tablet 11  .  furosemide (LASIX) 40 MG tablet Take 40 mg by mouth daily.  0  . hydrOXYzine (ATARAX/VISTARIL) 25 MG tablet Take 1 tablet (25 mg total) by mouth at bedtime as needed for anxiety (and insomnia). 30 tablet 2  . Menthol-Zinc Oxide (CALMOSEPTINE) 0.44-20.625 % OINT Apply 1 application topically daily.    Marland Kitchen nystatin (MYCOSTATIN/NYSTOP) powder Use as directed twice per day as needed 56.7 g 5  . QUEtiapine (SEROQUEL) 50 MG tablet 1-2 tab by mouth at bedtime 180 tablet 3  . rivaroxaban (XARELTO) 20 MG TABS tablet Take 1 tablet (20 mg total) by mouth daily with supper. 30 tablet 5   No current facility-administered medications on file prior to visit.     ALLERGIES: Allergies  Allergen Reactions  . Lipitor [Atorvastatin] Other (See Comments)    myalgia  . Codeine Other (See Comments)  . Lovastatin Other (See Comments)  . Pravastatin Sodium Other (See Comments)    FAMILY HISTORY: Family History  Problem Relation Age of Onset  . Hypertension Other     SOCIAL HISTORY: Social History   Social History  . Marital status: Married    Spouse name: N/A  . Number of children: 7  . Years of  education: N/A   Occupational History  . Beauty operateo/hair stylist    Social History Main Topics  . Smoking status: Never Smoker  . Smokeless tobacco: Never Used  . Alcohol use No  . Drug use: No  . Sexual activity: Not on file   Other Topics Concern  . Not on file   Social History Narrative  . No narrative on file    REVIEW OF SYSTEMS: Constitutional: No fevers, chills, or sweats, no generalized fatigue, change in appetite Eyes: No visual changes, double vision, eye pain Ear, nose and throat: No hearing loss, ear pain, nasal congestion, sore throat Cardiovascular: No chest pain, palpitations Respiratory:  No shortness of breath at rest or with exertion, wheezes GastrointestinaI: No nausea, vomiting, diarrhea, abdominal pain, fecal incontinence Genitourinary:  No dysuria, urinary retention  or frequency. +urgency Musculoskeletal:  No neck pain, back pain Integumentary: No rash, pruritus, skin lesions Neurological: as above Psychiatric: No depression, insomnia, anxiety Endocrine: No palpitations, fatigue, diaphoresis, mood swings, change in appetite, change in weight, increased thirst Hematologic/Lymphatic:  No anemia, purpura, petechiae. Allergic/Immunologic: no itchy/runny eyes, nasal congestion, recent allergic reactions, rashes  PHYSICAL EXAM: Vitals:   09/08/16 1542  BP: 128/72  Pulse: (!) 52   General: No acute distress, sitting in wheelchair, drowsy but easily arousable Head:  Normocephalic/atraumatic Neck: supple, no paraspinal tenderness, full range of motion Heart:  Regular rate and rhythm Lungs:  Clear to auscultation bilaterally Back: No paraspinal tenderness Skin/Extremities: No rash, no edema Neurological Exam: alert and oriented to person, place, states it is November 2017. No aphasia or dysarthria. Fund of knowledge is reduced. Recent and remote memory are impaired. 0/3 delayed recall. Attention and concentration are normal. 4/5 spelling WORLD backward.  Able to name objects and repeat phrases. Cranial nerves: Pupils equal, round, reactive to light.  Extraocular movements intact with no nystagmus. Visual fields full. Facial sensation intact. No facial asymmetry. Tongue, uvula, palate midline.  Motor: Bulk and tone normal, muscle strength 5/5 throughout with no pronator drift.  Sensation to light touch intact.  No extinction to double simultaneous stimulation.  Deep tendon reflexes 2+ throughout, toes downgoing.  Finger to nose testing intact.  Gait not tested, sitting on wheelchair.  IMPRESSION: This is an 83 yo RH woman with a history of hypertension, hyperlipidemia, encephalocele s/p surgery in 2003, with mild dementia and subcortical stroke in October 2017. MMSE in November 2017 was 19/30, indicating mild dementia, continue Aricept 10mg  daily. She is  tolerating this without side effects. She has day night reversal with urinary urgency keeping her up at night as well. Continue Seroquel, follow-up with PCP for urinary urgency. We discussed home safety and getting more help at home. She is now on anticoagulation with Xarelto for DVTs, we discussed control of vascular risk factors for secondary stroke prevention. They know to go to the ER immediately for any change in symptoms. We discussed the importance of control of vascular risk factors, physical exercise, and brain stimulation exercises for brain health. She will follow-up in 6 months and knows to call for any changes.   Thank you for allowing me to participate in her care.  Please do not hesitate to call for any questions or concerns.  The duration of this appointment visit was 15 minutes of face-to-face time with the patient.  Greater than 50% of this time was spent in counseling, explanation of diagnosis, planning of further management, and coordination of care.   Ellouise Newer, M.D.   CC: Dr. Jenny Reichmann

## 2016-09-08 NOTE — Patient Instructions (Signed)
1. Continue Aricept 10mg  daily 2. Look into [facility] for more help at home at night 3. Continue all your medications, control of blood pressure, cholesterol, physical exercise, and brain stimulation exercises for brain health 4. Follow-up in 6 months, call for any changes

## 2016-09-22 ENCOUNTER — Telehealth: Payer: Self-pay | Admitting: Internal Medicine

## 2016-09-22 NOTE — Telephone Encounter (Signed)
I am not sure about this  Maybe a Hoyer lift would be best?  but would need to be done by [facility], and I am not sure if insurance would cover this  If Heather [name] has an idea, I would ok to hear about it, thanks

## 2016-09-22 NOTE — Telephone Encounter (Signed)
Spoke with pt's daughter and informed her of the below info. She expressed understanding.

## 2016-09-22 NOTE — Telephone Encounter (Signed)
Cecille Rubin does [facility] ring a bell for you? Please advise.

## 2016-09-22 NOTE — Telephone Encounter (Signed)
She stated that she needs another referral to [facility] for nurse visit like was previously used.

## 2016-09-22 NOTE — Telephone Encounter (Signed)
Pts daughter met with [facility] about some assistance for the pt. She is needing something to help her get up from the bed to use the bed side toilet. They recommended that she check with Dr. Jenny Reichmann to see what he thought would be best. Please advise.

## 2016-09-22 NOTE — Telephone Encounter (Signed)
Sorry , I am still confused  Is "[facility] a home health agency?

## 2016-09-23 NOTE — Telephone Encounter (Signed)
Spoke with Heather [name] and informed her that [facility] would not be able to see the pt since she was just discharged and she informed me that she has made an appt for Friday to see PCP.

## 2016-09-23 NOTE — Telephone Encounter (Signed)
Spoke to [facility] and they will not be able to see her because they just discharged her on 5/31

## 2016-09-23 NOTE — Telephone Encounter (Signed)
Spoke to [facility]. She is checking with her manager to see what is needed and she will call back

## 2016-09-23 NOTE — Telephone Encounter (Signed)
Unfortunately I think this would require another OV for a "face to face" visit to document the need for Home Health services as is required by insurance  Let me know if this is not the case

## 2016-09-23 NOTE — Telephone Encounter (Signed)
She has been seeing [facility]

## 2016-09-25 ENCOUNTER — Encounter: Payer: Self-pay | Admitting: Internal Medicine

## 2016-09-25 ENCOUNTER — Ambulatory Visit (INDEPENDENT_AMBULATORY_CARE_PROVIDER_SITE_OTHER): Payer: Medicare Other | Admitting: Internal Medicine

## 2016-09-25 VITALS — BP 122/84 | HR 63 | Ht 61.0 in

## 2016-09-25 DIAGNOSIS — I6309 Cerebral infarction due to thrombosis of other precerebral artery: Secondary | ICD-10-CM

## 2016-09-25 DIAGNOSIS — M25512 Pain in left shoulder: Secondary | ICD-10-CM

## 2016-09-25 DIAGNOSIS — M25562 Pain in left knee: Secondary | ICD-10-CM

## 2016-09-25 DIAGNOSIS — G4700 Insomnia, unspecified: Secondary | ICD-10-CM | POA: Diagnosis not present

## 2016-09-25 DIAGNOSIS — R7302 Impaired glucose tolerance (oral): Secondary | ICD-10-CM

## 2016-09-25 DIAGNOSIS — F0391 Unspecified dementia with behavioral disturbance: Secondary | ICD-10-CM | POA: Diagnosis not present

## 2016-09-25 DIAGNOSIS — I5032 Chronic diastolic (congestive) heart failure: Secondary | ICD-10-CM | POA: Diagnosis not present

## 2016-09-25 DIAGNOSIS — M25511 Pain in right shoulder: Secondary | ICD-10-CM | POA: Insufficient documentation

## 2016-09-25 DIAGNOSIS — I10 Essential (primary) hypertension: Secondary | ICD-10-CM | POA: Diagnosis not present

## 2016-09-25 MED ORDER — TRAZODONE HCL 50 MG PO TABS: 25.0000 mg | ORAL_TABLET | Freq: Every evening | ORAL | 5 refills | Status: DC | PRN

## 2016-09-25 MED ORDER — QUETIAPINE FUMARATE 50 MG PO TABS: ORAL_TABLET | ORAL | 3 refills | Status: DC

## 2016-09-25 MED ORDER — TRAMADOL HCL 50 MG PO TABS: 50.0000 mg | ORAL_TABLET | Freq: Three times a day (TID) | ORAL | 2 refills | Status: DC | PRN

## 2016-09-25 NOTE — Patient Instructions (Signed)
Please take all new medication as prescribed - the trazodone for sleep, and tramadol for pain  OK to change the seroquel to 50 mg in th AM only  OK to stop the hydroxyzine  Please continue all other medications as before, and refills have been done if requested.  Please have the pharmacy call with any other refills you may need.  Please continue your efforts at being more active, low cholesterol diet, and weight control.  Please keep your appointments with your specialists as you may have planned  You will be contacted regarding the referral for: Dr Tamala Julian (sports medicine) for left shoulder and left knee  You will be contacted regarding the referral for: [facility] with RN, aide and Social Worker  You are given the prescription for hospital bed to hand to the [facility] representative when you are able  Please feel free to ask for help with [facility] as well (private service)  You will be contacted regarding the referral for: [facility] network (if you qualify)  Please return in 3 months, or sooner if needed, with Lab testing done 3-5 days before

## 2016-09-25 NOTE — Progress Notes (Signed)
Subjective:    Patient ID: Heather [name], female    DOB: 1928/11/08, 83 y.o.   MRN: [id number]  HPI  This is an 83 yo RH womanwith a history of hypertension, hyperlipidemia, encephalocele s/p surgery in 2003, with mild dementia and subcortical stroke in October 2017. MMSE in November 2017 was 19/30, indicating mild dementia, continue Aricept 10mg  daily.  Not wearing o2, breathes ok to sit up, so does not want to wear o2.  Seroquel has helped her anxiety somewhat to start, but higher dose caused sedation with sleep x 2 hrs (better) but woke up with urinary urgency but could not stand well.  Has seen Neuro with suggestion of taking 1.5 tabs instead.  Has some soreness "all over" after being somewhat jostled per family getting her to and from her appts.  Has to be lifted up in bed at night multiple times as she keeps sliding down.  Leg swelling persists, hard to keep leg elevated and keep the swelling down.  Tends to rock in bed when cant do much so has some soreness to the abdomen.  Grimaces the most with pain when having to be helped to the commode.  Currently sleeping on a couch with some pillows to keep her up more.  Has worsening memory issues followed per neurology with repeated questions and cant retain knowledge very long. Sometimes has lunch twice.  Hearing is good, and appetite is good.  Pt denies fever, wt loss, night sweats, loss of appetite, or other constitutional symptoms.  Wt Readings from Last 3 Encounters:  09/08/16 250 lb (113.4 kg)  06/12/16 238 lb (108 kg)  03/17/16 249 lb (112.9 kg)  Has persistent left sided weakness due to cva no change. Did have antibx recently for presumed UTI, Denies urinary symptoms such as dysuria, frequency, flank pain, hematuria or n/v, fever, chills.  Wears depends but less accidents now except for occasional night time.  Recently, now it seems 24/7 care at home getting very difficult for family. Had to pay $85 for transport here today.   Daughter mentions  Product/process development scientist as a Marine scientist since has recently found the name, but is ok with [facility]  No prior other HH in past.  Finished PT at home recently but she was not progressing, so this ended.  Currently cough to [facility] existence.  Today  Is first time in wheelchair in 2 wks.  Daughter reqeusts pain med for chronic pain  Past Medical History:  Diagnosis Date  . ALLERGIC RHINITIS 12/09/2006  . DEGENERATIVE JOINT DISEASE, LEFT KNEE 01/23/2007  . Encephalocele ([place]) 01/23/2007  . FEVER UNSPECIFIED 01/28/2010  . GERD 12/09/2006  . GLUCOSE INTOLERANCE 01/24/2009  . GOUT 12/09/2006  . HYPERCHOLESTEROLEMIA 12/09/2006  . HYPERLIPIDEMIA 12/18/2006  . HYPERTENSION 12/09/2006  . Impaired glucose tolerance 07/26/2010  . MENOPAUSAL DISORDER 01/25/2008   Past Surgical History:  Procedure Laterality Date  . s/p anterior encephalocele repair and craniotomy  2003    reports that she has never smoked. She has never used smokeless tobacco. She reports that she does not drink alcohol or use drugs. family history includes Hypertension in her other. Allergies  Allergen Reactions  . Lipitor [Atorvastatin] Other (See Comments)    myalgia  . Codeine Other (See Comments)  . Lovastatin Other (See Comments)  . Pravastatin Sodium Other (See Comments)   Current Outpatient Prescriptions on File Prior to Visit  Medication Sig Dispense Refill  . acetaminophen (TYLENOL) 500 MG tablet Take 500 mg by mouth every  6 (six) hours as needed for moderate pain (knee, leg, stomach).    Marland Kitchen amLODipine (NORVASC) 5 MG tablet Take 1 tablet (5 mg total) by mouth daily. 900 tablet 3  . benazepril (LOTENSIN) 40 MG tablet Take 40 mg by mouth daily.  0  . carvedilol (COREG) 25 MG tablet Take 1 tablet (25 mg total) by mouth 2 (two) times daily with a meal. 180 tablet 3  . donepezil (ARICEPT) 10 MG tablet Take 1 tablet daily 90 tablet 3  . furosemide (LASIX) 40 MG tablet Take 40 mg by mouth daily.  0  . Menthol-Zinc Oxide (CALMOSEPTINE)  0.44-20.625 % OINT Apply 1 application topically daily.    Marland Kitchen nystatin (MYCOSTATIN/NYSTOP) powder Use as directed twice per day as needed 56.7 g 5  . rivaroxaban (XARELTO) 20 MG TABS tablet Take 1 tablet (20 mg total) by mouth daily with supper. 30 tablet 5   No current facility-administered medications on file prior to visit.    Review of Systems  Constitutional: Negative for other unusual diaphoresis or sweats HENT: Negative for ear discharge or swelling Eyes: Negative for other worsening visual disturbances Respiratory: Negative for stridor or other swelling  Gastrointestinal: Negative for worsening distension or other blood Genitourinary: Negative for retention or other urinary change Musculoskeletal: Negative for other MSK pain or swelling Skin: Negative for color change or other new lesions Neurological: Negative for worsening tremors and other numbness  Psychiatric/Behavioral: Negative for worsening agitation or other fatigue All other system neg per pt though hx limited by dementia    Objective:   Physical Exam BP 122/84   Pulse 63   Ht 5\' 1"  (1.549 m)   SpO2 97%  VS noted, obese, examined in wheelchair Constitutional: Pt appears in NAD HENT: Head: NCAT.  Right Ear: External ear normal.  Left Ear: External ear normal.  Eyes: . Pupils are equal, round, and reactive to light. Conjunctivae and EOM are normal Nose: without d/c or deformity Neck: Neck supple. Gross normal ROM Cardiovascular: Normal rate and regular rhythm.   Pulmonary/Chest: Effort normal and breath sounds without rales or wheezing.  Abd:  Soft, NT, ND, + BS, no organomegaly Neurological: Pt is alert. At baseline orientation, motor grossly intact Left shoulder NT but unable to abduct > 90 degrees Left knee with 1+ effusion Skin: Skin is warm. No rashes, other new lesions Psychiatric: Pt behavior is normal without agitation  Diffuse LLE trace to 1+ edema No other exam findings    Assessment & Plan:

## 2016-09-26 NOTE — Assessment & Plan Note (Signed)
Mild to mod, for trazodone qhs prn,  to f/u any worsening symptoms or concerns 

## 2016-09-26 NOTE — Assessment & Plan Note (Signed)
Mild to mod, for pain control, refer sports med for further eval and tx

## 2016-09-26 NOTE — Assessment & Plan Note (Addendum)
With reduced ROM, for sport med referral, may need cortisone, doubt she is surgical candidate if rot cuff torn  Note:  Total time for pt hx, exam, review of record with pt in the room, determination of diagnoses and plan for further eval and tx is > 40 min, with over 50% spent in coordination and counseling of patient, including differential dx , evaluation and tx for left shoulder pain, insomnia, dementia with behavior disturbance, hx of stroke and exam for need for Home Health services

## 2016-09-26 NOTE — Assessment & Plan Note (Signed)
stable overall by history and exam, recent data reviewed with pt, and pt to continue medical treatment as before,  to f/u any worsening symptoms or concerns BP Readings from Last 3 Encounters:  09/25/16 122/84  09/08/16 128/72  08/13/16 116/64

## 2016-09-26 NOTE — Assessment & Plan Note (Addendum)
Stable neuro but worsening overall mobility and incontinence, requires increasing 24/7 care for virtually all needs including sitting up higher in the bed, to cont same tx, for [facility] RN, aide, social services to consider NH placement, [facility], tramaodl for pain, d/c atarax as not helping anxiety, and Hospital Bed

## 2016-09-26 NOTE — Assessment & Plan Note (Signed)
Stable, cont same tx 

## 2016-09-26 NOTE — Assessment & Plan Note (Signed)
Overall stable but higher dose seroquel too sedating, ok for seroquel 50 qam only

## 2016-09-26 NOTE — Assessment & Plan Note (Signed)
stable overall by history and exam, recent data reviewed with pt, and pt to continue medical treatment as before,  to f/u any worsening symptoms or concerns Lab Results  Component Value Date   HGBA1C 6.2 06/12/2016

## 2016-09-27 DIAGNOSIS — J9601 Acute respiratory failure with hypoxia: Secondary | ICD-10-CM | POA: Diagnosis not present

## 2016-09-27 DIAGNOSIS — I2699 Other pulmonary embolism without acute cor pulmonale: Secondary | ICD-10-CM | POA: Diagnosis not present

## 2016-09-27 DIAGNOSIS — I5032 Chronic diastolic (congestive) heart failure: Secondary | ICD-10-CM | POA: Diagnosis not present

## 2016-10-01 DIAGNOSIS — I6309 Cerebral infarction due to thrombosis of other precerebral artery: Secondary | ICD-10-CM | POA: Diagnosis not present

## 2016-10-01 DIAGNOSIS — I2699 Other pulmonary embolism without acute cor pulmonale: Secondary | ICD-10-CM | POA: Diagnosis not present

## 2016-10-01 DIAGNOSIS — J9601 Acute respiratory failure with hypoxia: Secondary | ICD-10-CM | POA: Diagnosis not present

## 2016-10-01 DIAGNOSIS — I5032 Chronic diastolic (congestive) heart failure: Secondary | ICD-10-CM | POA: Diagnosis not present

## 2016-10-07 ENCOUNTER — Telehealth: Payer: Self-pay | Admitting: Internal Medicine

## 2016-10-07 MED ORDER — NYSTATIN 100000 UNIT/GM EX POWD: CUTANEOUS | 5 refills | Status: DC

## 2016-10-07 NOTE — Telephone Encounter (Signed)
Ok for verbal if that is ok 

## 2016-10-07 NOTE — Telephone Encounter (Signed)
Called Nicolette and gave her the ok for the verbal orders.     Clarification:  Education officer, museum - helps with community resources  PT/OT- to go out and do evaluations (OT is on a 2 week delay)

## 2016-10-07 NOTE — Telephone Encounter (Signed)
Done

## 2016-10-07 NOTE — Telephone Encounter (Signed)
Daughter called concerned about her mother, since she has been in the hospital bed at her home she has noticed redness under her breast and now there is a large redness on her back. The pt is in a little pain, I told her this might be the start of a bed sore. Home health services is to start in July, she would like to know what can be done for this and if any pain medicine can be prescribed, she states she is cleaning and keeping her mother dry as best she can. Please call  Debra back in regard.

## 2016-10-07 NOTE — Telephone Encounter (Signed)
Dr John please advise.  

## 2016-10-07 NOTE — Telephone Encounter (Signed)
[place]  Need verbals for  Skilled nursing 1 week 1 2 week 2 1  Week 6  Need medical social worker for OT and PT  Ot is on a 2 week delay   Also Needing a home aid but they are on a indefinite hold in that area

## 2016-10-07 NOTE — Telephone Encounter (Signed)
Ok to continue the tramadol she already has for pain  Ok for nystatin powder for possible skin fungus - done erx

## 2016-10-07 NOTE — Telephone Encounter (Signed)
[facility] for [employer] but social workers do not do PT or OT

## 2016-10-08 NOTE — Telephone Encounter (Signed)
I think that goes to [facility], thanks

## 2016-10-08 NOTE — Telephone Encounter (Signed)
Should I contact [facility] or who should I relay that information to since I had been conversing with the pt's daughter?

## 2016-10-08 NOTE — Telephone Encounter (Signed)
[place] for verbal to home health for wound care consult/skin care consult

## 2016-10-08 NOTE — Telephone Encounter (Signed)
Spoke with her daughter, stated pt has been using the tramadol when needed and stated that it is skin breakdown under both breast and at the bottom of her buttocks and have been increasing in size. She stated that she has been using the nystatin powder for a couple of months and it doesn't seen to be working. She wanted to know if it is anything else that she could be prescribed to use in those areas? Please advise.

## 2016-10-09 NOTE — Telephone Encounter (Signed)
Spoke with pt's daughter and gave her the update.

## 2016-10-09 NOTE — Telephone Encounter (Signed)
[facility] forwarded the referral to well care due to insurance

## 2016-10-11 ENCOUNTER — Encounter (HOSPITAL_COMMUNITY): Payer: Self-pay | Admitting: Emergency Medicine

## 2016-10-11 ENCOUNTER — Emergency Department (HOSPITAL_COMMUNITY)
Admission: EM | Admit: 2016-10-11 | Discharge: 2016-10-11 | Disposition: A | Discharge status: Home / Self Care | Payer: Medicare Other | Attending: Emergency Medicine | Admitting: Emergency Medicine

## 2016-10-11 DIAGNOSIS — Z79899 Other long term (current) drug therapy: Secondary | ICD-10-CM | POA: Diagnosis not present

## 2016-10-11 DIAGNOSIS — I110 Hypertensive heart disease with heart failure: Secondary | ICD-10-CM | POA: Insufficient documentation

## 2016-10-11 DIAGNOSIS — Z8673 Personal history of transient ischemic attack (TIA), and cerebral infarction without residual deficits: Secondary | ICD-10-CM | POA: Insufficient documentation

## 2016-10-11 DIAGNOSIS — B379 Candidiasis, unspecified: Secondary | ICD-10-CM | POA: Diagnosis not present

## 2016-10-11 DIAGNOSIS — L89151 Pressure ulcer of sacral region, stage 1: Secondary | ICD-10-CM | POA: Diagnosis not present

## 2016-10-11 DIAGNOSIS — I5032 Chronic diastolic (congestive) heart failure: Secondary | ICD-10-CM | POA: Insufficient documentation

## 2016-10-11 DIAGNOSIS — B372 Candidiasis of skin and nail: Secondary | ICD-10-CM

## 2016-10-11 DIAGNOSIS — Z7902 Long term (current) use of antithrombotics/antiplatelets: Secondary | ICD-10-CM | POA: Insufficient documentation

## 2016-10-11 DIAGNOSIS — S21109A Unspecified open wound of unspecified front wall of thorax without penetration into thoracic cavity, initial encounter: Secondary | ICD-10-CM | POA: Diagnosis not present

## 2016-10-11 DIAGNOSIS — E669 Obesity, unspecified: Secondary | ICD-10-CM | POA: Diagnosis not present

## 2016-10-11 DIAGNOSIS — B999 Unspecified infectious disease: Secondary | ICD-10-CM | POA: Diagnosis not present

## 2016-10-11 DIAGNOSIS — L089 Local infection of the skin and subcutaneous tissue, unspecified: Secondary | ICD-10-CM | POA: Diagnosis not present

## 2016-10-11 LAB — BASIC METABOLIC PANEL
Anion gap: 8 (ref 5–15)
BUN: 32 mg/dL — ABNORMAL HIGH (ref 6–20)
CO2: 29 mmol/L (ref 22–32)
Calcium: 9.2 mg/dL (ref 8.9–10.3)
Chloride: 101 mmol/L (ref 101–111)
Creatinine, Ser: 1.23 mg/dL — ABNORMAL HIGH (ref 0.44–1.00)
GFR calc Af Amer: 45 mL/min — ABNORMAL LOW (ref >60)
GFR calc non Af Amer: 39 mL/min — ABNORMAL LOW (ref >60)
Glucose, Bld: 104 mg/dL — ABNORMAL HIGH (ref 65–99)
Potassium: 5.1 mmol/L (ref 3.5–5.1)
Sodium: 138 mmol/L (ref 135–145)

## 2016-10-11 LAB — URINALYSIS, ROUTINE W REFLEX MICROSCOPIC
Bilirubin Urine: NEGATIVE
Glucose, UA: NEGATIVE mg/dL
Hgb urine dipstick: NEGATIVE
Ketones, ur: NEGATIVE mg/dL
Leukocytes, UA: NEGATIVE
Nitrite: NEGATIVE
Protein, ur: NEGATIVE mg/dL
Specific Gravity, Urine: 1.011 (ref 1.005–1.030)
pH: 5 (ref 5.0–8.0)

## 2016-10-11 LAB — CBC WITH DIFFERENTIAL/PLATELET
Basophils Absolute: 0.1 10*3/uL (ref 0.0–0.1)
Basophils Relative: 1 %
Eosinophils Absolute: 0.5 10*3/uL (ref 0.0–0.7)
Eosinophils Relative: 7 %
HCT: 38.6 % (ref 36.0–46.0)
Hemoglobin: 12.4 g/dL (ref 12.0–15.0)
Lymphocytes Relative: 21 %
Lymphs Abs: 1.4 10*3/uL (ref 0.7–4.0)
MCH: 27.8 pg (ref 26.0–34.0)
MCHC: 32.1 g/dL (ref 30.0–36.0)
MCV: 86.5 fL (ref 78.0–100.0)
Monocytes Absolute: 0.9 10*3/uL (ref 0.1–1.0)
Monocytes Relative: 13 %
Neutro Abs: 3.9 10*3/uL (ref 1.7–7.7)
Neutrophils Relative %: 58 %
Platelets: 367 10*3/uL (ref 150–400)
RBC: 4.46 MIL/uL (ref 3.87–5.11)
RDW: 15.3 % (ref 11.5–15.5)
WBC: 6.7 10*3/uL (ref 4.0–10.5)

## 2016-10-11 NOTE — Discharge Instructions (Signed)
Continue applying your nystatin powder as prescribed. I recommend also continuing to keep area dry to prevent worsening of infection. I also recommend continuing to change sacral/Roxicet dressings to prevent worsening of pressure ulcer. Continue to have her wounds managed by wound care nurse week at her first scheduled appointment. Follow-up with your primary care provider within the next week for follow-up evaluation. Please return to the Emergency Department if symptoms worsen or new onset of fever, chest pain, difficulty breathing, abdominal pain, vomiting, redness, swelling, warmth, drainage, altered mental status.

## 2016-10-11 NOTE — ED Notes (Signed)
Pt verbalized understanding discharge instructions and denies any further needs or questions at this time. VS stable, PTAR called.

## 2016-10-11 NOTE — ED Triage Notes (Signed)
Per EMS- pt here for evaluation of skin breakdown under both breasts, sacral wound, and discoloration and blister to left heel.   PT repositioned with assistance, Mepilex dressing applied to sacral wound. Padded dressing applied to L heel.

## 2016-10-11 NOTE — ED Provider Notes (Signed)
[facility] DEPT Provider Note   CSN: [id number] Arrival date & time: 10/11/16  1328     History   Chief Complaint Chief Complaint  Patient presents with  . wound    HPI Heather [name] is a 83 y.o. female.  HPI   Patient is a 83 year old female with history of dementia, hypertension, hyperlipidemia, gout, GERD, CVA who presents to the ED via EMS from home with complaints of bedsores. Patient's daughter at bedside reports she has been having the patient stay with her since October when she had her most recent stroke. Daughter reports patient with deficit of right-sided weakness and notes she has gradually had worsening weakness over the past 6 months since her stroke. She notes the patient typically lays in bed all day and states she was sleeping on the sofa which was more comfortable until she recently was prescribed a hospital bed by her PCP which they received this week. Daughter states since the patient has been staying in the hospital bed she has had slightly worsening rash last wound under bilateral breasts. Daughter states she has been applying the prescribed nystatin powder her PCP gave her further rash which she notes has overall been improving but states she is concerned she small openings present near the rash. Daughter also reports patient having multiple small sores to her sacral region which she notes have only been having small amount of serous drainage. Denies associated swelling, redness, purulent drainage or fluid. Daughter reports she recently spoke with the patient's PCP regarding her wounds and states they have wound care nurse scheduled to come out to their house next week. Denies patient having fever, cough, chest pain, shortness of breath, abdominal pain, nausea, vomiting, diarrhea, urinary symptoms. Daughter reports patient is at her baseline mental status but states she was concerned about her wounds which is why she brought her to the ED today.  PCP- Dr. Jeneen Rinks  Past  Medical History:  Diagnosis Date  . ALLERGIC RHINITIS 12/09/2006  . DEGENERATIVE JOINT DISEASE, LEFT KNEE 01/23/2007  . Encephalocele ([place]) 01/23/2007  . FEVER UNSPECIFIED 01/28/2010  . GERD 12/09/2006  . GLUCOSE INTOLERANCE 01/24/2009  . GOUT 12/09/2006  . HYPERCHOLESTEROLEMIA 12/09/2006  . HYPERLIPIDEMIA 12/18/2006  . HYPERTENSION 12/09/2006  . Impaired glucose tolerance 07/26/2010  . MENOPAUSAL DISORDER 01/25/2008    Patient Active Problem List   Diagnosis Date Noted  . Left shoulder pain 09/25/2016  . Insomnia 08/16/2016  . Dysuria 08/16/2016  . Chronic hypoxemic respiratory failure ([place]) 06/12/2016  . Nocturia 03/17/2016  . Mild dementia 03/09/2016  . Stroke syndrome ([place]) 03/09/2016  . Acute CVA (cerebrovascular accident) ([place]) 02/05/2016  . Rash 02/05/2016  . DVT of lower extremity, bilateral ([place]) 01/28/2016  . Diastolic CHF, chronic ([place]) 01/28/2016  . Acute kidney injury ([place])   . Pulmonary embolus ([place])   . Acute respiratory failure with hypoxia ([place]) 01/24/2016  . Dementia with behavioral disturbance 01/01/2016  . Non compliance w medication regimen 01/01/2016  . General weakness 01/01/2016  . Peripheral edema 01/01/2016  . Chronic venous insufficiency 08/01/2014  . Fall at home 08/01/2013  . Osteopenia 08/01/2013  . Left knee pain 01/29/2011  . Encounter for well adult exam with abnormal findings 07/26/2010  . Impaired glucose tolerance 07/26/2010  . MENOPAUSAL DISORDER 01/25/2008  . Osteoarthrosis, unspecified whether generalized or localized, involving lower leg 01/23/2007  . Encephalocele ([place]) 01/23/2007  . Hyperlipidemia 12/18/2006  . GOUT 12/09/2006  . Essential hypertension 12/09/2006  . ALLERGIC RHINITIS 12/09/2006  . GERD  12/09/2006    Past Surgical History:  Procedure Laterality Date  . s/p anterior encephalocele repair and craniotomy  2003    OB History    No data available       Home Medications    Prior to Admission medications     Medication Sig Start Date End Date Taking? Authorizing Provider  acetaminophen (TYLENOL) 500 MG tablet Take 500 mg by mouth every 6 (six) hours as needed for moderate pain (knee, leg, stomach).    [provider]  amLODipine (NORVASC) 5 MG tablet Take 1 tablet (5 mg total) by mouth daily. 01/01/16   Biagio Borg, MD  benazepril (LOTENSIN) 40 MG tablet Take 40 mg by mouth daily. 01/01/16   [provider]  carvedilol (COREG) 25 MG tablet Take 1 tablet (25 mg total) by mouth 2 (two) times daily with a meal. 01/01/16   Biagio Borg, MD  donepezil (ARICEPT) 10 MG tablet Take 1 tablet daily 09/08/16   Cameron Sprang, MD  furosemide (LASIX) 40 MG tablet Take 40 mg by mouth daily. 01/01/16   [provider]  Menthol-Zinc Oxide (CALMOSEPTINE) 0.44-20.625 % OINT Apply 1 application topically daily.    [provider]  nystatin (MYCOSTATIN/NYSTOP) powder Use as directed twice per day as needed 10/07/16   Biagio Borg, MD  QUEtiapine (SEROQUEL) 50 MG tablet 1 tab by mouth in the AM 09/25/16   Biagio Borg, MD  rivaroxaban (XARELTO) 20 MG TABS tablet Take 1 tablet (20 mg total) by mouth daily with supper. 02/24/16   Biagio Borg, MD  traMADol (ULTRAM) 50 MG tablet Take 1 tablet (50 mg total) by mouth every 8 (eight) hours as needed. 09/25/16   Biagio Borg, MD  traZODone (DESYREL) 50 MG tablet Take 0.5-1 tablets (25-50 mg total) by mouth at bedtime as needed for sleep. 09/25/16   Biagio Borg, MD    Family History Family History  Problem Relation Age of Onset  . Hypertension Other     Social History Social History  Substance Use Topics  . Smoking status: Never Smoker  . Smokeless tobacco: Never Used  . Alcohol use No     Allergies   Lipitor [atorvastatin]; Codeine; Lovastatin; and Pravastatin sodium   Review of Systems Review of Systems  Skin: Positive for wound.  All other systems reviewed and are negative.    Physical Exam Updated Vital Signs BP  110/75   Pulse (!) 54   Temp 98.3 F (36.8 C) (Oral)   Resp 17   SpO2 92%   Physical Exam  Constitutional: She is oriented to person, place, and time. She appears well-developed and well-nourished. No distress.  Morbidly obese female  HENT:  Head: Normocephalic and atraumatic.  Mouth/Throat: Oropharynx is clear and moist. No oropharyngeal exudate.  Eyes: Conjunctivae and EOM are normal. Pupils are equal, round, and reactive to light. Right eye exhibits no discharge. Left eye exhibits no discharge. No scleral icterus.  Neck: Normal range of motion. Neck supple.  Cardiovascular: Normal rate, regular rhythm, normal heart sounds and intact distal pulses.   Pulmonary/Chest: Effort normal and breath sounds normal. No respiratory distress. She has no wheezes. She has no rales. She exhibits no tenderness.  Abdominal: Soft. Bowel sounds are normal. She exhibits no distension and no mass. There is no tenderness. There is no rebound and no guarding. No hernia.  Musculoskeletal: She exhibits no edema.  Neurological: She is alert and oriented to person, place, and  time.  Skin: Skin is warm and dry. Rash noted. She is not diaphoretic.  Macerated wound present to inferiomedial aspect of bilateral breasts without surrounding swelling, erythema, warmth or drainage.   Multiple stage 1 ulcers present to sacral region without surrounding swelling, erythema, warmth or drainage.   See images below.   Nursing note and vitals reviewed.           ED Treatments / Results  Labs (all labs ordered are listed, but only abnormal results are displayed) Labs Reviewed  BASIC METABOLIC PANEL - Abnormal; Notable for the following:       Result Value   Glucose, Bld 104 (*)    BUN 32 (*)    Creatinine, Ser 1.23 (*)    GFR calc non Af Amer 39 (*)    GFR calc Af Amer 45 (*)    All other components within normal limits  CBC WITH DIFFERENTIAL/PLATELET  URINALYSIS, ROUTINE W REFLEX MICROSCOPIC    EKG  EKG  Interpretation None       Radiology No results found.  Procedures Procedures (including critical care time)  Medications Ordered in ED Medications - No data to display   Initial Impression / Assessment and Plan / ED Course  I have reviewed the triage vital signs and the nursing notes.  Pertinent labs & imaging results that were available during my care of the patient were reviewed by me and considered in my medical decision making (see chart for details).    Patient presents from home due to daughter's (primary care giver) concern for rash around her breasts and bedsores present to her sacral region. Denies fever, altered mental status, change in behavior or any other associated symptoms. VSS. Exam revealed rash present to inferior breasts with macerated wound consistent with candidal infection. Stage 1 ulcers present to sacral region without any evidence of surrounding cellulitis or abscess. Daughter reports patient has been using nystatin powder prescribed by PCP with improvement of rash. Daughter states they're scheduled to have wound care nurse come out to the house next week for further management of her pressure ulcers. Discussed pt with Dr. Laverta Baltimore. Plan to order basic labs and urine for evaluation of possible infection with plan of discharging patient home with wound care and outpatient follow-up.     Final Clinical Impressions(s) / ED Diagnoses   Final diagnoses:  Decubitus ulcer of sacral region, stage 1  Candidal skin infection    New Prescriptions New Prescriptions   No medications on file     Nona Dell, Hershal Coria 10/11/16 1605    Long, Wonda Olds, MD 10/12/16 [phone]

## 2016-10-11 NOTE — ED Notes (Addendum)
Rolled at 13:45, on her L side at this time.

## 2016-10-12 ENCOUNTER — Telehealth: Payer: Self-pay | Admitting: Internal Medicine

## 2016-10-12 NOTE — Telephone Encounter (Signed)
Heather [name] is requesting verbal order for [name] one time a week for two weeks and two times a week for 4 weeks.

## 2016-10-12 NOTE — Telephone Encounter (Signed)
Gave verbal orders for PT.

## 2016-10-12 NOTE — Telephone Encounter (Signed)
LVM for Caren Griffins with verbal orders for heel protectors

## 2016-10-12 NOTE — Telephone Encounter (Signed)
Caren Griffins from [facility] would like an order for heel protectors for the Patient.  Can call and leave order on phone number [phone]

## 2016-10-12 NOTE — Telephone Encounter (Signed)
Ok for verbals 

## 2016-10-13 ENCOUNTER — Telehealth: Payer: Self-pay | Admitting: *Deleted

## 2016-10-13 NOTE — Telephone Encounter (Signed)
Rec'd call from pt daughter wanting to get a refill on mom Nystatin powder. Inform daughter per chart med was refilled on 10/07/16, and [facility]. She is needing to make sure [facility]. She [facility] [place]. Updated pharmacy...Johny Chess

## 2016-10-15 ENCOUNTER — Telehealth: Payer: Self-pay | Admitting: Internal Medicine

## 2016-10-15 NOTE — Telephone Encounter (Signed)
Pt's daughter has been informed and expressed understanding.  

## 2016-10-15 NOTE — Telephone Encounter (Signed)
[place] for ensure 1 can tid between meals, or at least gatorade  If still not eating or drinking well by tomorrow, she would most likely need to go to ED for ? dehydration

## 2016-10-15 NOTE — Telephone Encounter (Signed)
Pts daughter called stating that her mother has not been eating well. She has no appetite and will only eat 2 or 3 spoon fulls per meal. Today she has not had anything to eat or drink. Because of this, she taken her medication because her daughter does not want her to take them on an empty stomach. She does not know what to do. Is there anything you would recommend?

## 2016-10-21 NOTE — Progress Notes (Deleted)
[facility] [address], [place] [address] Phone: [phone] Subjective:    I'm seeing this patient by the request  of:  Biagio Borg, MD   CC: Left shoulder pain  VVO:[id number]  Heather [name] is a 83 y.o. female coming in with complaint of left shoulder pain.     Past Medical History:  Diagnosis Date  . ALLERGIC RHINITIS 12/09/2006  . DEGENERATIVE JOINT DISEASE, LEFT KNEE 01/23/2007  . Encephalocele ([place]) 01/23/2007  . FEVER UNSPECIFIED 01/28/2010  . GERD 12/09/2006  . GLUCOSE INTOLERANCE 01/24/2009  . GOUT 12/09/2006  . HYPERCHOLESTEROLEMIA 12/09/2006  . HYPERLIPIDEMIA 12/18/2006  . HYPERTENSION 12/09/2006  . Impaired glucose tolerance 07/26/2010  . MENOPAUSAL DISORDER 01/25/2008   Past Surgical History:  Procedure Laterality Date  . s/p anterior encephalocele repair and craniotomy  2003   Social History   Social History  . Marital status: Married    Spouse name: N/A  . Number of children: 7  . Years of education: N/A   Occupational History  . Beauty operateo/hair stylist    Social History Main Topics  . Smoking status: Never Smoker  . Smokeless tobacco: Never Used  . Alcohol use No  . Drug use: No  . Sexual activity: Not on file   Other Topics Concern  . Not on file   Social History Narrative  . No narrative on file   Allergies  Allergen Reactions  . Lipitor [Atorvastatin] Other (See Comments)    myalgia  . Codeine Other (See Comments)  . Lovastatin Other (See Comments)  . Pravastatin Sodium Other (See Comments)   Family History  Problem Relation Age of Onset  . Hypertension Other     Past medical history, social, surgical and family history all reviewed in electronic medical record.  No pertanent information unless stated regarding to the chief complaint.   Review of Systems:Review of systems updated and as accurate as of 10/21/16  No headache, visual changes, nausea, vomiting, diarrhea, constipation,  dizziness, abdominal pain, skin rash, fevers, chills, night sweats, weight loss, swollen lymph nodes, body aches, joint swelling, muscle aches, chest pain, shortness of breath, mood changes.   Objective  There were no vitals taken for this visit. Systems examined below as of 10/21/16   General: No apparent distress alert and oriented x3 mood and affect normal, dressed appropriately.  HEENT: Pupils equal, extraocular movements intact  Respiratory: Patient's speak in full sentences and does not appear short of breath  Cardiovascular: No lower extremity edema, non tender, no erythema  Skin: Warm dry intact with no signs of infection or rash on extremities or on axial skeleton.  Abdomen: Soft nontender  Neuro: Cranial nerves II through XII are intact, neurovascularly intact in all extremities with 2+ DTRs and 2+ pulses.  Lymph: No lymphadenopathy of posterior or anterior cervical chain or axillae bilaterally.  Gait normal with good balance and coordination.  MSK:  Non tender with full range of motion and good stability and symmetric strength and tone of  elbows, wrist, hip, knee and ankles bilaterally.  Shoulder: left Inspection reveals no abnormalities, atrophy or asymmetry. Palpation is normal with no tenderness over AC joint or bicipital groove. ROM is full in all planes passively. Rotator cuff strength normal throughout. signs of impingement with positive Neer and Hawkin's tests, but negative empty can sign. Speeds and Yergason's tests normal. No labral pathology noted with negative Obrien's, negative clunk and good stability. Normal scapular function observed. No painful  arc and no drop arm sign. No apprehension sign  MSK US performed of: left This study was ordered, performed, and interpreted by Charlann Boxer D.O.  Shoulder:   Supraspinatus:  Appears normal on long and transverse views, Bursal bulge seen with shoulder abduction on impingement view. Infraspinatus:  Appears normal on  long and transverse views. Significant increase in Doppler flow Subscapularis:  Appears normal on long and transverse views. Positive bursa Teres Minor:  Appears normal on long and transverse views. AC joint:  Capsule undistended, no geyser sign. Glenohumeral Joint:  Appears normal without effusion. Glenoid Labrum:  Intact without visualized tears. Biceps Tendon:  Appears normal on long and transverse views, no fraying of tendon, tendon located in intertubercular groove, no subluxation with shoulder internal or external rotation.  Impression: Subacromial bursitis  Procedure: Real-time Ultrasound Guided Injection of left glenohumeral joint Device: GE Logiq E  Ultrasound guided injection is preferred based studies that show increased duration, increased effect, greater accuracy, decreased procedural pain, increased response rate with ultrasound guided versus blind injection.  Verbal informed consent obtained.  Time-out conducted.  Noted no overlying erythema, induration, or other signs of local infection.  Skin prepped in a sterile fashion.  Local anesthesia: Topical Ethyl chloride.  With sterile technique and under real time ultrasound guidance:  Joint visualized.  23g 1  inch needle inserted posterior approach. Pictures taken for needle placement. Patient did have injection of 2 cc of 1% lidocaine, 2 cc of 0.5% Marcaine, and 1.0 cc of Kenalog 40 mg/dL. Completed without difficulty  Pain immediately resolved suggesting accurate placement of the medication.  Advised to call if fevers/chills, erythema, induration, drainage, or persistent bleeding.  Images permanently stored and available for review in the ultrasound unit.  Impression: Technically successful ultrasound guided injection.    Impression and Recommendations:     This case required medical decision making of moderate complexity.      Note: This dictation was prepared with Dragon dictation along with smaller phrase  technology. Any transcriptional errors that result from this process are unintentional.

## 2016-10-22 ENCOUNTER — Ambulatory Visit: Payer: Medicare Other | Admitting: Family Medicine

## 2016-10-22 ENCOUNTER — Ambulatory Visit: Payer: Medicare Other | Admitting: Internal Medicine

## 2016-10-22 ENCOUNTER — Emergency Department (HOSPITAL_COMMUNITY)
Admission: EM | Admit: 2016-10-22 | Discharge: 2016-10-22 | Disposition: A | Discharge status: Home / Self Care | Payer: Medicare Other | Attending: Emergency Medicine | Admitting: Emergency Medicine

## 2016-10-22 ENCOUNTER — Emergency Department (HOSPITAL_COMMUNITY): Payer: Medicare Other

## 2016-10-22 ENCOUNTER — Encounter (HOSPITAL_COMMUNITY): Payer: Self-pay | Admitting: Obstetrics and Gynecology

## 2016-10-22 DIAGNOSIS — S8992XA Unspecified injury of left lower leg, initial encounter: Secondary | ICD-10-CM | POA: Diagnosis not present

## 2016-10-22 DIAGNOSIS — L97421 Non-pressure chronic ulcer of left heel and midfoot limited to breakdown of skin: Secondary | ICD-10-CM | POA: Diagnosis not present

## 2016-10-22 DIAGNOSIS — S91309A Unspecified open wound, unspecified foot, initial encounter: Secondary | ICD-10-CM | POA: Diagnosis not present

## 2016-10-22 DIAGNOSIS — Z7901 Long term (current) use of anticoagulants: Secondary | ICD-10-CM | POA: Diagnosis not present

## 2016-10-22 DIAGNOSIS — Y939 Activity, unspecified: Secondary | ICD-10-CM | POA: Diagnosis not present

## 2016-10-22 DIAGNOSIS — Z79899 Other long term (current) drug therapy: Secondary | ICD-10-CM | POA: Insufficient documentation

## 2016-10-22 DIAGNOSIS — Y999 Unspecified external cause status: Secondary | ICD-10-CM | POA: Diagnosis not present

## 2016-10-22 DIAGNOSIS — X58XXXA Exposure to other specified factors, initial encounter: Secondary | ICD-10-CM | POA: Diagnosis not present

## 2016-10-22 DIAGNOSIS — S90822A Blister (nonthermal), left foot, initial encounter: Secondary | ICD-10-CM | POA: Diagnosis present

## 2016-10-22 DIAGNOSIS — I110 Hypertensive heart disease with heart failure: Secondary | ICD-10-CM | POA: Diagnosis not present

## 2016-10-22 DIAGNOSIS — Y929 Unspecified place or not applicable: Secondary | ICD-10-CM | POA: Diagnosis not present

## 2016-10-22 DIAGNOSIS — S99922A Unspecified injury of left foot, initial encounter: Secondary | ICD-10-CM | POA: Diagnosis not present

## 2016-10-22 DIAGNOSIS — M25512 Pain in left shoulder: Secondary | ICD-10-CM | POA: Diagnosis not present

## 2016-10-22 DIAGNOSIS — T25329A Burn of third degree of unspecified foot, initial encounter: Secondary | ICD-10-CM | POA: Diagnosis not present

## 2016-10-22 DIAGNOSIS — S4991XA Unspecified injury of right shoulder and upper arm, initial encounter: Secondary | ICD-10-CM | POA: Diagnosis not present

## 2016-10-22 DIAGNOSIS — M79672 Pain in left foot: Secondary | ICD-10-CM | POA: Diagnosis not present

## 2016-10-22 DIAGNOSIS — T300 Burn of unspecified body region, unspecified degree: Secondary | ICD-10-CM | POA: Diagnosis not present

## 2016-10-22 DIAGNOSIS — M1991 Primary osteoarthritis, unspecified site: Secondary | ICD-10-CM | POA: Diagnosis not present

## 2016-10-22 DIAGNOSIS — M159 Polyosteoarthritis, unspecified: Secondary | ICD-10-CM

## 2016-10-22 DIAGNOSIS — M150 Primary generalized (osteo)arthritis: Secondary | ICD-10-CM | POA: Insufficient documentation

## 2016-10-22 DIAGNOSIS — I5032 Chronic diastolic (congestive) heart failure: Secondary | ICD-10-CM | POA: Diagnosis not present

## 2016-10-22 DIAGNOSIS — M25511 Pain in right shoulder: Secondary | ICD-10-CM | POA: Diagnosis not present

## 2016-10-22 DIAGNOSIS — M8949 Other hypertrophic osteoarthropathy, multiple sites: Secondary | ICD-10-CM

## 2016-10-22 DIAGNOSIS — M25562 Pain in left knee: Secondary | ICD-10-CM | POA: Diagnosis not present

## 2016-10-22 DIAGNOSIS — E669 Obesity, unspecified: Secondary | ICD-10-CM | POA: Diagnosis not present

## 2016-10-22 DIAGNOSIS — S4992XA Unspecified injury of left shoulder and upper arm, initial encounter: Secondary | ICD-10-CM | POA: Diagnosis not present

## 2016-10-22 DIAGNOSIS — X030XXA Exposure to flames in controlled fire, not in building or structure, initial encounter: Secondary | ICD-10-CM | POA: Diagnosis not present

## 2016-10-22 LAB — COMPREHENSIVE METABOLIC PANEL
ALT: 15 U/L (ref 14–54)
AST: 30 U/L (ref 15–41)
Albumin: 3 g/dL — ABNORMAL LOW (ref 3.5–5.0)
Alkaline Phosphatase: 87 U/L (ref 38–126)
Anion gap: 11 (ref 5–15)
BUN: 23 mg/dL — ABNORMAL HIGH (ref 6–20)
CO2: 29 mmol/L (ref 22–32)
Calcium: 9.3 mg/dL (ref 8.9–10.3)
Chloride: 99 mmol/L — ABNORMAL LOW (ref 101–111)
Creatinine, Ser: 1.59 mg/dL — ABNORMAL HIGH (ref 0.44–1.00)
GFR calc Af Amer: 33 mL/min — ABNORMAL LOW (ref >60)
GFR calc non Af Amer: 28 mL/min — ABNORMAL LOW (ref >60)
Glucose, Bld: 98 mg/dL (ref 65–99)
Potassium: 4.7 mmol/L (ref 3.5–5.1)
Sodium: 139 mmol/L (ref 135–145)
Total Bilirubin: 0.5 mg/dL (ref 0.3–1.2)
Total Protein: 7.6 g/dL (ref 6.5–8.1)

## 2016-10-22 LAB — CBC WITH DIFFERENTIAL/PLATELET
Basophils Absolute: 0 10*3/uL (ref 0.0–0.1)
Basophils Relative: 1 %
Eosinophils Absolute: 0.3 10*3/uL (ref 0.0–0.7)
Eosinophils Relative: 6 %
HCT: 41.5 % (ref 36.0–46.0)
Hemoglobin: 13.4 g/dL (ref 12.0–15.0)
Lymphocytes Relative: 28 %
Lymphs Abs: 1.5 10*3/uL (ref 0.7–4.0)
MCH: 27.2 pg (ref 26.0–34.0)
MCHC: 32.3 g/dL (ref 30.0–36.0)
MCV: 84.2 fL (ref 78.0–100.0)
Monocytes Absolute: 1 10*3/uL (ref 0.1–1.0)
Monocytes Relative: 19 %
Neutro Abs: 2.5 10*3/uL (ref 1.7–7.7)
Neutrophils Relative %: 46 %
Platelets: 330 10*3/uL (ref 150–400)
RBC: 4.93 MIL/uL (ref 3.87–5.11)
RDW: 15.1 % (ref 11.5–15.5)
WBC: 5.3 10*3/uL (ref 4.0–10.5)

## 2016-10-22 NOTE — Care Management Note (Signed)
Case Management Note  Patient Details  Name: Heather [name] MRN: [id number] Date of Birth: June 05, 1928  Subjective/Objective:   83 year old female presents to the ED with decreased mobility and increased weakness.  Family was unable to get pt to an appointment today.                Action/Plan:    Social work consult and PT eval for SNF placement at D/C.  Expected Discharge Date:   (Unsure)               Expected Discharge Plan:  Skilled Nursing Facility  In-House Referral:  Clinical Social Work  Discharge planning Services  CM Consult  Status of Service:  In process, will continue to follow  Corky Crafts, RN 10/22/2016, 3:27 PM

## 2016-10-22 NOTE — ED Notes (Addendum)
Main Phlebotomy able to obtain labs

## 2016-10-22 NOTE — Progress Notes (Signed)
CSW responded to consult for SNF, spoke with EDP who requested CM consult for Home Health.  CSW spoke with family who requested to speak to CM about Home Health needs.  Per pt's daughter Heywood Iles pt gets medical equipment from [facility], but has been visited by [facility] recently about Home Health needs, but insurance refused to pay.  Per Miss Glynda Jaeger, she is appealing the insurance denial/case now.    Miss Glynda Jaeger would like guidance from CM on how to proceed and which company to follow up with for Home Health needs.  As CSW was leaving pt's grandson stated his 37 Miss Glynda Jaeger is usoing his grandmother's [place] equipment or attempting to obtain [facility] equipment for her husband instead of for her mother (the pt).  Per the pt's grandson Miss Glynda Jaeger is not taking adequate care of her mother (the pt) and is attempting to defraud the insurance company to get a [place] lift for her husband using her mother's (the pt's) insurance.  CSW provided pt's grandson with the number for APS and guidance on how to make a APS report.  Pt's grandson stated he would like to make a report and follow up with APS himself.  Family stated CM can contact [phone] to speak to family today, if needed.  CSW believes pt's generalized weakness and blister on her heal is not likely to be skilleable, nor likely to receive insurance authorization from [facility] but will telll family they can follow up with their PCP at D/C to request a recommendation and/or FL-2 if they desire.  Alphonse Guild. Riffey, Reed Pandy, CSI Clinical Social Worker Ph: [phone]

## 2016-10-22 NOTE — ED Notes (Signed)
ED Provider at bedside. 

## 2016-10-22 NOTE — ED Notes (Signed)
Patient transported to X-ray 

## 2016-10-22 NOTE — ED Provider Notes (Signed)
[facility] DEPT Provider Note   CSN: [id number] Arrival date & time: 10/22/16  1124     History   Chief Complaint Chief Complaint  Patient presents with  . Blister    HPI Heather [name] is a 83 y.o. female.  HPI Patient had an appointment today to be seen by her PCP. She was being seen for left shoulder pain and bilateral knee pain. They were also seeking evaluation for a pressure sore on the patient's left heel. The patient has been very immobile for over 2 weeks. The family had too much difficulty getting the patient into her wheelchair and moved in order to make their appointment. They contacted her doctor and advised to come to the emergency department for evaluation. The patient's sister is her main caregiver. She has been attending to skin candidal rashes. She reports most of these are healing. She is concerned though that this pressure sore on her heel might rupture and she would know how to manage that wound. The patient has been essentially bedridden since she got a hospital bed at home. She frequently lies on the right side. Her sister reports she also rubs the heel back and forth on the bed which is causing a lot of the pressure and friction. Past Medical History:  Diagnosis Date  . ALLERGIC RHINITIS 12/09/2006  . DEGENERATIVE JOINT DISEASE, LEFT KNEE 01/23/2007  . Encephalocele ([place]) 01/23/2007  . FEVER UNSPECIFIED 01/28/2010  . GERD 12/09/2006  . GLUCOSE INTOLERANCE 01/24/2009  . GOUT 12/09/2006  . HYPERCHOLESTEROLEMIA 12/09/2006  . HYPERLIPIDEMIA 12/18/2006  . HYPERTENSION 12/09/2006  . Impaired glucose tolerance 07/26/2010  . MENOPAUSAL DISORDER 01/25/2008    Patient Active Problem List   Diagnosis Date Noted  . Left shoulder pain 09/25/2016  . Insomnia 08/16/2016  . Dysuria 08/16/2016  . Chronic hypoxemic respiratory failure ([place]) 06/12/2016  . Nocturia 03/17/2016  . Mild dementia 03/09/2016  . Stroke syndrome ([place]) 03/09/2016  . Acute CVA (cerebrovascular  accident) ([place]) 02/05/2016  . Rash 02/05/2016  . DVT of lower extremity, bilateral ([place]) 01/28/2016  . Diastolic CHF, chronic ([place]) 01/28/2016  . Acute kidney injury ([place])   . Pulmonary embolus ([place])   . Acute respiratory failure with hypoxia ([place]) 01/24/2016  . Dementia with behavioral disturbance 01/01/2016  . Non compliance w medication regimen 01/01/2016  . General weakness 01/01/2016  . Peripheral edema 01/01/2016  . Chronic venous insufficiency 08/01/2014  . Fall at home 08/01/2013  . Osteopenia 08/01/2013  . Left knee pain 01/29/2011  . Encounter for well adult exam with abnormal findings 07/26/2010  . Impaired glucose tolerance 07/26/2010  . MENOPAUSAL DISORDER 01/25/2008  . Osteoarthrosis, unspecified whether generalized or localized, involving lower leg 01/23/2007  . Encephalocele ([place]) 01/23/2007  . Hyperlipidemia 12/18/2006  . GOUT 12/09/2006  . Essential hypertension 12/09/2006  . ALLERGIC RHINITIS 12/09/2006  . GERD 12/09/2006    Past Surgical History:  Procedure Laterality Date  . s/p anterior encephalocele repair and craniotomy  2003    OB History    No data available       Home Medications    Prior to Admission medications   Medication Sig Start Date End Date Taking? Authorizing Provider  amLODipine (NORVASC) 5 MG tablet Take 1 tablet (5 mg total) by mouth daily. 01/01/16  Yes Biagio Borg, MD  benazepril (LOTENSIN) 40 MG tablet Take 40 mg by mouth daily. 01/01/16  Yes [provider]  carvedilol (COREG) 25 MG tablet Take 1 tablet (25 mg total) by  mouth 2 (two) times daily with a meal. 01/01/16  Yes Biagio Borg, MD  donepezil (ARICEPT) 10 MG tablet Take 1 tablet daily 09/08/16  Yes Cameron Sprang, MD  furosemide (LASIX) 40 MG tablet Take 40 mg by mouth daily. 01/01/16  Yes [provider]  nystatin (MYCOSTATIN/NYSTOP) powder Use as directed twice per day as needed Patient taking differently: Use as directed twice per day as needed for  itching 10/07/16  Yes Biagio Borg, MD  QUEtiapine (SEROQUEL) 50 MG tablet 1 tab by mouth in the AM 09/25/16  Yes Biagio Borg, MD  rivaroxaban (XARELTO) 20 MG TABS tablet Take 1 tablet (20 mg total) by mouth daily with supper. 02/24/16  Yes Biagio Borg, MD  traMADol (ULTRAM) 50 MG tablet Take 1 tablet (50 mg total) by mouth every 8 (eight) hours as needed. Patient taking differently: Take 50 mg by mouth every 8 (eight) hours as needed for severe pain.  09/25/16  Yes Biagio Borg, MD  traZODone (DESYREL) 50 MG tablet Take 0.5-1 tablets (25-50 mg total) by mouth at bedtime as needed for sleep. 09/25/16  Yes Biagio Borg, MD  acetaminophen (TYLENOL) 500 MG tablet Take 500 mg by mouth every 6 (six) hours as needed for moderate pain (knee, leg, stomach).    [provider]    Family History Family History  Problem Relation Age of Onset  . Hypertension Other     Social History Social History  Substance Use Topics  . Smoking status: Never Smoker  . Smokeless tobacco: Never Used  . Alcohol use No     Allergies   Lipitor [atorvastatin]; Codeine; Lovastatin; and Pravastatin sodium   Review of Systems Review of Systems Level V caveat cannot obtain review of systems from patient due to dementia and mental status change.  Physical Exam Updated Vital Signs BP (!) 123/46 (BP Location: Right Arm)   Pulse 63   Temp 98 F (36.7 C) (Oral)   Resp 18   SpO2 92%   Physical Exam  Constitutional:  Patient is alert. Patient is nontoxic. No respiratory distress. Morbid obesity.  HENT:  Head: Normocephalic and atraumatic.  Mouth/Throat: Oropharynx is clear and moist.  Eyes: EOM are normal.  Cardiovascular: Normal rate, regular rhythm, normal heart sounds and intact distal pulses.   Pulmonary/Chest: Effort normal and breath sounds normal.  Abdominal:  Abdomen is obese soft and nontender.  Musculoskeletal:  Patient does not have obvious deformity of the shoulders. They can be  moved in some 4 to back range of motion without severe pain. Bilateral lower extremities are very obese but do not appear to have edema at this time. Left heel has a hemorrhagic blister that is 6 cm diameter. Surrounding skin however does not have erythema or swelling. No general edema of the foot.  Neurological: She is alert.  Patient is alert but significantly immobilized due to body habitus and prior stroke.  Skin: Skin is warm and dry.  Psychiatric: She has a normal mood and affect.     ED Treatments / Results  Labs (all labs ordered are listed, but only abnormal results are displayed) Labs Reviewed  COMPREHENSIVE METABOLIC PANEL - Abnormal; Notable for the following:       Result Value   Chloride 99 (*)    BUN 23 (*)    Creatinine, Ser 1.59 (*)    Albumin 3.0 (*)    GFR calc non Af Amer 28 (*)    GFR calc  Af Amer 33 (*)    All other components within normal limits  CBC WITH DIFFERENTIAL/PLATELET  PROTIME-INR    EKG  EKG Interpretation None       Radiology Dg Shoulder Right  Result Date: 10/22/2016 CLINICAL DATA:  Shoulder pain after fall 3 months ago. EXAM: RIGHT SHOULDER - 2+ VIEW COMPARISON:  None. FINDINGS: The humeral head is well-formed and located. The subacromial, glenohumeral and acromioclavicular joint spaces are intact. Mild subacromial and acromioclavicular joint space narrowing with marginal spurring. Corticated bony fragment projecting in anterior shoulder. No destructive bony lesions. Osteopenia. Soft tissue planes are non-suspicious. IMPRESSION: No acute fracture deformity or dislocation. Mild mild degenerative change of the shoulder. Suspected loose body anterior shoulder. Electronically Signed   By: Elon Alas M.D.   On: 10/22/2016 15:14   Dg Knee 2 Views Left  Result Date: 10/22/2016 CLINICAL DATA:  Bilateral knee pain after fall 3 months ago. EXAM: LEFT KNEE - 1-2 VIEW COMPARISON:  None. FINDINGS: No evidence of fracture, dislocation, or joint  effusion. Severe narrowing and osteophyte formation is seen involving the medial joint space. Moderate narrowing osteophyte formation is seen involving the lateral joint space. Soft tissues are unremarkable. IMPRESSION: Severe degenerative joint disease. No acute abnormality seen in the left knee. Electronically Signed   By: Marijo Conception, M.D.   On: 10/22/2016 15:15   Dg Knee 2 Views Right  Result Date: 10/22/2016 CLINICAL DATA:  Bilateral knee pain after fall 3 months ago. EXAM: RIGHT KNEE - 1-2 VIEW COMPARISON:  None. FINDINGS: No evidence of fracture, dislocation, or joint effusion. Severe narrowing of medial joint space is noted with osteophyte formation. Moderate narrowing is seen involving the lateral joint space. Soft tissues are unremarkable. IMPRESSION: Severe degenerative joint disease. No acute abnormality seen in the right knee. Electronically Signed   By: Marijo Conception, M.D.   On: 10/22/2016 15:16   Dg Shoulder Left  Result Date: 10/22/2016 CLINICAL DATA:  Persistent pain after fall 3 months prior EXAM: LEFT SHOULDER - 2+ VIEW COMPARISON:  None. FINDINGS: Frontal and Y scapular images were obtained. There is no fracture or dislocation. There is moderate osteoarthritic change. No erosive change or intra-articular calcification. There is mild left midlung atelectasis. There is aortic atherosclerosis. There is left carotid artery calcification. IMPRESSION: Moderate osteoarthritic change. No fracture or dislocation. There is aortic atherosclerosis. There is left carotid artery calcification. Aortic Atherosclerosis (ICD10-I70.0). Electronically Signed   By: Lowella Grip III M.D.   On: 10/22/2016 15:14   Dg Foot Complete Left  Result Date: 10/22/2016 CLINICAL DATA:  Pain after fall 3 months ago. EXAM: LEFT FOOT - COMPLETE 3+ VIEW COMPARISON:  None. FINDINGS: There is no evidence of acute fracture or dislocation. There is no evidence of arthropathy. Old healed fifth metatarsal fracture is  noted. Soft tissues are unremarkable. IMPRESSION: Old healed fifth metatarsal fracture. No acute abnormality seen in the left foot. Electronically Signed   By: Marijo Conception, M.D.   On: 10/22/2016 15:18    Procedures Procedures (including critical care time)  Medications Ordered in ED Medications - No data to display   Initial Impression / Assessment and Plan / ED Course  I have reviewed the triage vital signs and the nursing notes.  Pertinent labs & imaging results that were available during my care of the patient were reviewed by me and considered in my medical decision making (see chart for details).      Final Clinical Impressions(s) / ED Diagnoses  Final diagnoses:  Skin ulcer of left heel, limited to breakdown of skin ([place])  Primary osteoarthritis involving multiple joints   Patient has intact heel pressure wound. No signs of secondary infection. Padding and management are reviewed. Instruction are given for wound care follow-up. As the patient is bedbound social work and case management have been consult as well for assistance in home. Patient does not appear to have any acute exacerbations of chronic medical conditions. X-rays were obtained for pain that the patient was following up with her PCP for this is been chronic arthritic type pain. No acute joint effusions or erythema is present. Patient is essentially bedbound. New Prescriptions New Prescriptions   No medications on file     Charlesetta Shanks, MD 10/22/16 1758

## 2016-10-22 NOTE — ED Notes (Signed)
Open area noted in the top of the gluteal folds. Opening of the skin with white center and reddened skin surrounding

## 2016-10-22 NOTE — ED Notes (Signed)
MD made aware that Pt-INR was not completed due to inadequate amount of blood drawn to run test.

## 2016-10-22 NOTE — ED Notes (Signed)
Bed: VH29 Expected date:  Expected time:  Means of arrival:  Comments: EMS-blister on foot-83 y/o

## 2016-10-22 NOTE — ED Notes (Signed)
Failed attempt to collect labs   

## 2016-10-22 NOTE — ED Notes (Signed)
Left heel wrapped with telfa dressing and kerlix gauze.

## 2016-10-22 NOTE — ED Notes (Signed)
[facility] Called for transport 

## 2016-10-22 NOTE — Progress Notes (Addendum)
CSW alerted CM that a new CM consult for Home Health was being placed by EPD at [facility] ED.  CM aware and will contact family shortly at [phone] to speak to [facility], pt's daughter.  CSW called family and they are aware CM is calling.  Alphonse Guild. Riffey, Reed Pandy, CSI Clinical Social Worker Ph: [phone]

## 2016-10-22 NOTE — ED Notes (Signed)
MD made aware of blood draw difficulties

## 2016-10-22 NOTE — ED Triage Notes (Signed)
Per EMS Pt is coming from home Pt has a blood blister on her left heel and has missed multiple doctors appointments. Pt's daughter worried the blister would rupture at home and she would not know how to deal with it. 138/100 115 CBG No complaints except heel blister.  Pt has a hx of stroke and a cardiac stent placement.

## 2016-10-22 NOTE — ED Notes (Signed)
Phlebotomy at bedside.

## 2016-10-22 NOTE — Discharge Instructions (Signed)
Keep padding on the patient's heel. There are special fleece lined boots for wearing in bed. If the blister on the heel ruptures, apply a nonstick dressing that can be purchased at the pharmacy and gauze wrap. Follow-up with wound care for ongoing treatment of the heel.

## 2016-10-22 NOTE — Progress Notes (Signed)
CSW responded to consult for SNF, spoke with EDP who requested CM consult for Home Health.  CSW spoke with family who requested to speak to CM about Home Health needs.  Per pt's daughter Heywood Iles pt gets medical equipment from [facility], but has been visited by [facility] recently about Home Health needs, but insurance refused to pay.  Per Miss Glynda Jaeger, she is appealing the insurance denial/case now.    Miss Glynda Jaeger would like guidance from CM on how to proceed and which company to follow up with for Home Health needs.  As CSW was leaving pt's grandson stated his 46 Miss Glynda Jaeger is usoing his grandmother's [place] equipment or attempting to obtain [employer] equipment for her husband instead of for her mother (the pt).  Per the pt's grandson Miss Glynda Jaeger is not taking adequate care of her mother (the pt) and is attempting to defraud the insurance company to get a [place] lift for her husband using her mother's (the pt's) insurance.  CSW provided pt's grandson with the number for APS and guidance on how to make a APS report.  Pt's grandson stated he would like to make a report and follow up with APS himself.  Family stated CM can contact [phone] to speak to family today, if needed.  Alphonse Guild. Riffey, Reed Pandy, CSI Clinical Social Worker Ph: [phone]

## 2016-10-27 DIAGNOSIS — B372 Candidiasis of skin and nail: Secondary | ICD-10-CM | POA: Diagnosis not present

## 2016-10-27 DIAGNOSIS — I872 Venous insufficiency (chronic) (peripheral): Secondary | ICD-10-CM | POA: Diagnosis not present

## 2016-10-27 DIAGNOSIS — R32 Unspecified urinary incontinence: Secondary | ICD-10-CM | POA: Diagnosis not present

## 2016-10-27 DIAGNOSIS — F0391 Unspecified dementia with behavioral disturbance: Secondary | ICD-10-CM | POA: Diagnosis not present

## 2016-10-27 DIAGNOSIS — J9611 Chronic respiratory failure with hypoxia: Secondary | ICD-10-CM | POA: Diagnosis not present

## 2016-10-27 DIAGNOSIS — Z9981 Dependence on supplemental oxygen: Secondary | ICD-10-CM

## 2016-10-27 DIAGNOSIS — M109 Gout, unspecified: Secondary | ICD-10-CM | POA: Diagnosis not present

## 2016-10-27 DIAGNOSIS — Z86718 Personal history of other venous thrombosis and embolism: Secondary | ICD-10-CM

## 2016-10-27 DIAGNOSIS — I2699 Other pulmonary embolism without acute cor pulmonale: Secondary | ICD-10-CM | POA: Diagnosis not present

## 2016-10-27 DIAGNOSIS — Z9181 History of falling: Secondary | ICD-10-CM

## 2016-10-27 DIAGNOSIS — J9601 Acute respiratory failure with hypoxia: Secondary | ICD-10-CM | POA: Diagnosis not present

## 2016-10-27 DIAGNOSIS — Z7901 Long term (current) use of anticoagulants: Secondary | ICD-10-CM

## 2016-10-27 DIAGNOSIS — I110 Hypertensive heart disease with heart failure: Secondary | ICD-10-CM | POA: Diagnosis not present

## 2016-10-27 DIAGNOSIS — I69354 Hemiplegia and hemiparesis following cerebral infarction affecting left non-dominant side: Secondary | ICD-10-CM | POA: Diagnosis not present

## 2016-10-27 DIAGNOSIS — E785 Hyperlipidemia, unspecified: Secondary | ICD-10-CM

## 2016-10-27 DIAGNOSIS — M1711 Unilateral primary osteoarthritis, right knee: Secondary | ICD-10-CM | POA: Diagnosis not present

## 2016-10-27 DIAGNOSIS — K219 Gastro-esophageal reflux disease without esophagitis: Secondary | ICD-10-CM

## 2016-10-27 DIAGNOSIS — I5032 Chronic diastolic (congestive) heart failure: Secondary | ICD-10-CM | POA: Diagnosis not present

## 2016-10-28 DIAGNOSIS — I6309 Cerebral infarction due to thrombosis of other precerebral artery: Secondary | ICD-10-CM | POA: Diagnosis not present

## 2016-10-28 DIAGNOSIS — I2699 Other pulmonary embolism without acute cor pulmonale: Secondary | ICD-10-CM | POA: Diagnosis not present

## 2016-10-28 DIAGNOSIS — J9601 Acute respiratory failure with hypoxia: Secondary | ICD-10-CM | POA: Diagnosis not present

## 2016-10-28 DIAGNOSIS — I5032 Chronic diastolic (congestive) heart failure: Secondary | ICD-10-CM | POA: Diagnosis not present

## 2016-10-31 DIAGNOSIS — J9601 Acute respiratory failure with hypoxia: Secondary | ICD-10-CM | POA: Diagnosis not present

## 2016-10-31 DIAGNOSIS — I5032 Chronic diastolic (congestive) heart failure: Secondary | ICD-10-CM | POA: Diagnosis not present

## 2016-10-31 DIAGNOSIS — I2699 Other pulmonary embolism without acute cor pulmonale: Secondary | ICD-10-CM | POA: Diagnosis not present

## 2016-10-31 DIAGNOSIS — I6309 Cerebral infarction due to thrombosis of other precerebral artery: Secondary | ICD-10-CM | POA: Diagnosis not present

## 2016-11-12 DIAGNOSIS — Z0279 Encounter for issue of other medical certificate: Secondary | ICD-10-CM

## 2016-11-25 ENCOUNTER — Telehealth: Payer: Self-pay | Admitting: Internal Medicine

## 2016-11-25 NOTE — Telephone Encounter (Signed)
Called pt's daughter, [name], LVM for her to call me back.

## 2016-11-25 NOTE — Telephone Encounter (Signed)
Pt's daughter has been informed and expressed understanding.  

## 2016-11-25 NOTE — Telephone Encounter (Signed)
Pts daughter/caregiver called stating that the pt had a blister on her right heel that has ruptured. It is on her entire heel. She said that they have it bandaged up and it does not appear to be infected but she is wanting to prevent it from becoming infected.Marland Kitchen She wanted to know if an antibiotic could be prescribed. Please advise.

## 2016-11-25 NOTE — Telephone Encounter (Signed)
Very sorry, but prevention of infection in this way is not the standard of care as this can lead to antibiotic resistance and eventually the antibiotics will not work well  OK to cont with daily dressing changes and topical neosporin, and call for any worsening fever, red, swelling or drainage, as she may need to be seen in the office or ED

## 2016-11-27 DIAGNOSIS — I5032 Chronic diastolic (congestive) heart failure: Secondary | ICD-10-CM | POA: Diagnosis not present

## 2016-11-27 DIAGNOSIS — J9601 Acute respiratory failure with hypoxia: Secondary | ICD-10-CM | POA: Diagnosis not present

## 2016-11-27 DIAGNOSIS — I2699 Other pulmonary embolism without acute cor pulmonale: Secondary | ICD-10-CM | POA: Diagnosis not present

## 2016-11-28 DIAGNOSIS — I5032 Chronic diastolic (congestive) heart failure: Secondary | ICD-10-CM | POA: Diagnosis not present

## 2016-11-28 DIAGNOSIS — I6309 Cerebral infarction due to thrombosis of other precerebral artery: Secondary | ICD-10-CM | POA: Diagnosis not present

## 2016-11-28 DIAGNOSIS — J9601 Acute respiratory failure with hypoxia: Secondary | ICD-10-CM | POA: Diagnosis not present

## 2016-11-28 DIAGNOSIS — I2699 Other pulmonary embolism without acute cor pulmonale: Secondary | ICD-10-CM | POA: Diagnosis not present

## 2016-12-01 DIAGNOSIS — I6309 Cerebral infarction due to thrombosis of other precerebral artery: Secondary | ICD-10-CM | POA: Diagnosis not present

## 2016-12-01 DIAGNOSIS — J9601 Acute respiratory failure with hypoxia: Secondary | ICD-10-CM | POA: Diagnosis not present

## 2016-12-01 DIAGNOSIS — I5032 Chronic diastolic (congestive) heart failure: Secondary | ICD-10-CM | POA: Diagnosis not present

## 2016-12-01 DIAGNOSIS — I2699 Other pulmonary embolism without acute cor pulmonale: Secondary | ICD-10-CM | POA: Diagnosis not present

## 2016-12-15 ENCOUNTER — Other Ambulatory Visit (INDEPENDENT_AMBULATORY_CARE_PROVIDER_SITE_OTHER): Payer: Medicare Other

## 2016-12-15 DIAGNOSIS — I10 Essential (primary) hypertension: Secondary | ICD-10-CM

## 2016-12-15 DIAGNOSIS — R7302 Impaired glucose tolerance (oral): Secondary | ICD-10-CM | POA: Diagnosis not present

## 2016-12-15 LAB — LIPID PANEL
Cholesterol: 212 mg/dL — ABNORMAL HIGH (ref 0–200)
HDL: 31.8 mg/dL — ABNORMAL LOW (ref >39.00)
LDL Cholesterol: 142 mg/dL — ABNORMAL HIGH (ref 0–99)
NonHDL: 180.12
Total CHOL/HDL Ratio: 7
Triglycerides: 189 mg/dL — ABNORMAL HIGH (ref 0.0–149.0)
VLDL: 37.8 mg/dL (ref 0.0–40.0)

## 2016-12-15 LAB — CBC WITH DIFFERENTIAL/PLATELET
Basophils Absolute: 0.1 10*3/uL (ref 0.0–0.1)
Basophils Relative: 1.6 % (ref 0.0–3.0)
Eosinophils Absolute: 0.3 10*3/uL (ref 0.0–0.7)
Eosinophils Relative: 7.7 % — ABNORMAL HIGH (ref 0.0–5.0)
HCT: 40.3 % (ref 36.0–46.0)
Hemoglobin: 12.8 g/dL (ref 12.0–15.0)
Lymphocytes Relative: 35 % (ref 12.0–46.0)
Lymphs Abs: 1.5 10*3/uL (ref 0.7–4.0)
MCHC: 31.7 g/dL (ref 30.0–36.0)
MCV: 86 fl (ref 78.0–100.0)
Monocytes Absolute: 0.7 10*3/uL (ref 0.1–1.0)
Monocytes Relative: 15.5 % — ABNORMAL HIGH (ref 3.0–12.0)
Neutro Abs: 1.7 10*3/uL (ref 1.4–7.7)
Neutrophils Relative %: 40.2 % — ABNORMAL LOW (ref 43.0–77.0)
Platelets: 300 10*3/uL (ref 150.0–400.0)
RBC: 4.68 Mil/uL (ref 3.87–5.11)
RDW: 15.3 % (ref 11.5–15.5)
WBC: 4.4 10*3/uL (ref 4.0–10.5)

## 2016-12-15 LAB — HEPATIC FUNCTION PANEL
ALT: 12 U/L (ref 0–35)
AST: 21 U/L (ref 0–37)
Albumin: 3.2 g/dL — ABNORMAL LOW (ref 3.5–5.2)
Alkaline Phosphatase: 73 U/L (ref 39–117)
Bilirubin, Direct: 0.1 mg/dL (ref 0.0–0.3)
Total Bilirubin: 0.5 mg/dL (ref 0.2–1.2)
Total Protein: 6.9 g/dL (ref 6.0–8.3)

## 2016-12-15 LAB — BASIC METABOLIC PANEL
BUN: 32 mg/dL — ABNORMAL HIGH (ref 6–23)
CO2: 33 mEq/L — ABNORMAL HIGH (ref 19–32)
Calcium: 9.6 mg/dL (ref 8.4–10.5)
Chloride: 98 mEq/L (ref 96–112)
Creatinine, Ser: 1.78 mg/dL — ABNORMAL HIGH (ref 0.40–1.20)
GFR: 34.73 mL/min — ABNORMAL LOW (ref >60.00)
Glucose, Bld: 107 mg/dL — ABNORMAL HIGH (ref 70–99)
Potassium: 4 mEq/L (ref 3.5–5.1)
Sodium: 138 mEq/L (ref 135–145)

## 2016-12-15 LAB — HEMOGLOBIN A1C: Hgb A1c MFr Bld: 6.4 % (ref 4.6–6.5)

## 2016-12-15 LAB — TSH: TSH: 3.43 u[IU]/mL (ref 0.35–4.50)

## 2016-12-16 ENCOUNTER — Encounter: Payer: Self-pay | Admitting: Internal Medicine

## 2016-12-17 ENCOUNTER — Encounter: Payer: Self-pay | Admitting: Internal Medicine

## 2016-12-17 ENCOUNTER — Ambulatory Visit (INDEPENDENT_AMBULATORY_CARE_PROVIDER_SITE_OTHER): Payer: Medicare Other | Admitting: Internal Medicine

## 2016-12-17 VITALS — BP 130/76 | HR 61 | Temp 98.4°F | Ht 61.0 in

## 2016-12-17 DIAGNOSIS — Z7189 Other specified counseling: Secondary | ICD-10-CM | POA: Diagnosis not present

## 2016-12-17 DIAGNOSIS — I5032 Chronic diastolic (congestive) heart failure: Secondary | ICD-10-CM | POA: Diagnosis not present

## 2016-12-17 DIAGNOSIS — Z0001 Encounter for general adult medical examination with abnormal findings: Secondary | ICD-10-CM

## 2016-12-17 DIAGNOSIS — Z23 Encounter for immunization: Secondary | ICD-10-CM

## 2016-12-17 DIAGNOSIS — F0391 Unspecified dementia with behavioral disturbance: Secondary | ICD-10-CM | POA: Diagnosis not present

## 2016-12-17 DIAGNOSIS — I6309 Cerebral infarction due to thrombosis of other precerebral artery: Secondary | ICD-10-CM | POA: Diagnosis not present

## 2016-12-17 DIAGNOSIS — R269 Unspecified abnormalities of gait and mobility: Secondary | ICD-10-CM | POA: Diagnosis not present

## 2016-12-17 DIAGNOSIS — M25511 Pain in right shoulder: Secondary | ICD-10-CM | POA: Diagnosis not present

## 2016-12-17 DIAGNOSIS — E785 Hyperlipidemia, unspecified: Secondary | ICD-10-CM

## 2016-12-17 MED ORDER — RIVAROXABAN 20 MG PO TABS: 20.0000 mg | ORAL_TABLET | Freq: Every day | ORAL | 11 refills | Status: DC

## 2016-12-17 MED ORDER — AMLODIPINE BESYLATE 5 MG PO TABS: 5.0000 mg | ORAL_TABLET | Freq: Every day | ORAL | 3 refills | Status: DC

## 2016-12-17 MED ORDER — FUROSEMIDE 40 MG PO TABS: 40.0000 mg | ORAL_TABLET | Freq: Every day | ORAL | 3 refills | Status: DC | PRN

## 2016-12-17 NOTE — Assessment & Plan Note (Signed)
Likely overcontrolled with recent increased creatinine, to HOLD the lasix for 5 days, then only take 40 mg daily prn swelling after

## 2016-12-17 NOTE — Assessment & Plan Note (Signed)

## 2016-12-17 NOTE — Progress Notes (Signed)
Subjective:    Patient ID: Heather [name], female    DOB: April 14, 1928, 83 y.o.   MRN: [id number]  HPI  Here for wellness and f/u with daughter who remains supportive;  Overall doing ok;  Pt denies Chest pain, worsening SOB, DOE, wheezing, orthopnea, PND, worsening LE edema, palpitations, dizziness or syncope.  Pt denies neurological change such as new headache, facial or extremity weakness.  Pt denies polydipsia, polyuria, or low sugar symptoms. Pt states overall good compliance with treatment and medications, good tolerability, and has been trying to follow appropriate diet.  Pt denies worsening depressive symptoms, suicidal ideation or panic. No fever, night sweats, or other constitutional symptoms though does have reduced po intake recently and Leg swelling resolved..  Pt states poor ability with ADL's, has high fall risk, home safety reviewed and adequate, no other significant changes in hearing or vision,and not active with exercise. Was referred at last visit for [facility] with RN, PT, aide and social worker, but as pt had just finished course of PT within 30 days, none of it was done.  Now pt is wheelchair to bed existence and has not standing for 2 months.  Has ongoing right shoulder pain and lower back pain as well as worsening dementia in the setting of prior stroke.  Daughter mentions med changes from last visit have helped including trazodone qhs, taking seroquel 50 qam only, stopping the atarax.  Never was able to be scheduled with Dr Tamala Julian due to his schedule full, did see ortho and xray c/w DJD but no other specific tx felt needed at the time.They do have some self pay service ongoing through [employer].  No other new complaints Past Medical History:  Diagnosis Date  . ALLERGIC RHINITIS 12/09/2006  . DEGENERATIVE JOINT DISEASE, LEFT KNEE 01/23/2007  . Encephalocele ([place]) 01/23/2007  . FEVER UNSPECIFIED 01/28/2010  . GERD 12/09/2006  . GLUCOSE INTOLERANCE 01/24/2009  . GOUT 12/09/2006  .  HYPERCHOLESTEROLEMIA 12/09/2006  . HYPERLIPIDEMIA 12/18/2006  . HYPERTENSION 12/09/2006  . Impaired glucose tolerance 07/26/2010  . MENOPAUSAL DISORDER 01/25/2008   Past Surgical History:  Procedure Laterality Date  . s/p anterior encephalocele repair and craniotomy  2003    reports that she has never smoked. She has never used smokeless tobacco. She reports that she does not drink alcohol or use drugs. family history includes Hypertension in her other. Allergies  Allergen Reactions  . Lipitor [Atorvastatin] Other (See Comments)    myalgia  . Codeine Other (See Comments)  . Lovastatin Other (See Comments)  . Pravastatin Sodium Other (See Comments)   Current Outpatient Prescriptions on File Prior to Visit  Medication Sig Dispense Refill  . acetaminophen (TYLENOL) 500 MG tablet Take 500 mg by mouth every 6 (six) hours as needed for moderate pain (knee, leg, stomach).    . benazepril (LOTENSIN) 40 MG tablet Take 40 mg by mouth daily.  0  . carvedilol (COREG) 25 MG tablet Take 1 tablet (25 mg total) by mouth 2 (two) times daily with a meal. 180 tablet 3  . donepezil (ARICEPT) 10 MG tablet Take 1 tablet daily 90 tablet 3  . nystatin (MYCOSTATIN/NYSTOP) powder Use as directed twice per day as needed (Patient taking differently: Use as directed twice per day as needed for itching) 56.7 g 5  . QUEtiapine (SEROQUEL) 50 MG tablet 1 tab by mouth in the AM 90 tablet 3  . traMADol (ULTRAM) 50 MG tablet Take 1 tablet (50 mg total) by mouth every 8 (eight)  hours as needed. (Patient taking differently: Take 50 mg by mouth every 8 (eight) hours as needed for severe pain. ) 90 tablet 2  . traZODone (DESYREL) 50 MG tablet Take 0.5-1 tablets (25-50 mg total) by mouth at bedtime as needed for sleep. 30 tablet 5   No current facility-administered medications on file prior to visit.    Review of Systems  unable due to dementia    Objective:   Physical Exam BP 130/76   Pulse 61   Temp 98.4 F (36.9 C)  (Oral)   Ht 5\' 1"  (1.549 m)   SpO2 98%  VS noted, sleepy at times but alert other, not ill appearing Constitutional: Pt appears in NAD HENT: Head: NCAT.  Right Ear: External ear normal.  Left Ear: External ear normal.  Eyes: . Pupils are equal, round, and reactive to light. Conjunctivae and EOM are normal Nose: without d/c or deformity Neck: Neck supple. Gross normal ROM Cardiovascular: Normal rate and regular rhythm.   Pulmonary/Chest: Effort normal and breath sounds without rales or wheezing.  Abd:  Soft, NT, ND, + BS, no organomegaly Neurological: Pt is alert. At baseline orientation, motor grossly intact Skin: Skin is warm. No rashes, other new lesions, no LE edema Psychiatric: Pt behavior is normal without agitation  No other exam findings  Transthoracic Echocardiography - summary Patient:    Annaly, Skop MR #:       [id number] Study Date: 01/26/2016 LV EF: 55% -   60% Study Conclusions - Left ventricle: The cavity size was normal. Systolic function was   normal. The estimated ejection fraction was in the range of 55%   to 60%. Wall motion was normal; there were no regional wall   motion abnormalities. Doppler parameters are consistent with high   ventricular filling pressure. Diastolic dysfunction, grade   indeterminate. - Aortic valve: There was mild regurgitation. - Mitral valve: Calcified annulus. There was mild regurgitation. - Left atrium: The atrium was moderately dilated. - Right ventricle: The cavity size was mildly dilated. Systolic   function was mildly reduced. - Right atrium: The atrium was mildly dilated. - Tricuspid valve: There was mild-moderate regurgitation. - Pulmonary arteries: Systolic pressure was moderately increased.     Assessment & Plan:

## 2016-12-17 NOTE — Assessment & Plan Note (Addendum)
Now wheelchair to bed x 2 mo without standing, may not be able to cooperate well with PT due to dementia, will try to ask [facility] to see with RN, PT, aide and social worker  In addition to the time spent performing CPE, I spent an additional 25 minutes face to face,in which greater than 50% of this time was spent in counseling and coordination of care for patient's acute illness as documented, including the differential diagnosis, tx, furthr evaluation and other management of gait disorder, right shoulder pain, dementia, stroke, diastolic dysfunction, HLD and DNR discussion with daughter

## 2016-12-17 NOTE — Assessment & Plan Note (Signed)
Mod elevated despite less nutrition recently per daughter, to hold statin while trying to become more ambulatory

## 2016-12-17 NOTE — Assessment & Plan Note (Signed)
D/w daughter who speaks for pt unable to comprehend or make judgement; ok for out of hospital DNR status (and form signed and given to pt today) to include no CPR, epi, defibrillation or intubation.

## 2016-12-17 NOTE — Patient Instructions (Addendum)
You had the flu shot today  Ok to [facility] on the lasix (furosemide) for 5 days, then restart at 40 mg per day ONLY if swelling starts that does not go down with leg elevation  Please continue all other medications as before, and refills have been done if requested.  Please have the pharmacy call with any other refills you may need.  Please continue your efforts at being more active, low cholesterol diet, and weight control.  You are otherwise up to date with prevention measures today.  Please keep your appointments with your specialists as you may have planned  You will be contacted regarding the referral for: Home Health with RN, PT, aide and social worker  You will be contacted regarding the referral for: [facility] which is a local group to help seniors as well   You will be contacted regarding the referral for: Dr Tamala Julian or Dr Raeford Razor for the walking issue and right shoulder pain  Your out of hospital DNR form was signed today  Please return in 3 months, or sooner if needed

## 2016-12-17 NOTE — Assessment & Plan Note (Signed)
Possible mild worsening, will also ask [facility] to assess

## 2016-12-17 NOTE — Assessment & Plan Note (Signed)
Persistent, to also refer Dr Smith/sports medicine

## 2016-12-17 NOTE — Assessment & Plan Note (Signed)
Stable, cont's to declines statin due to leg weakness, and cont xarelto

## 2016-12-28 ENCOUNTER — Ambulatory Visit: Payer: Medicare Other | Admitting: Family Medicine

## 2016-12-28 DIAGNOSIS — J9601 Acute respiratory failure with hypoxia: Secondary | ICD-10-CM | POA: Diagnosis not present

## 2016-12-28 DIAGNOSIS — I5032 Chronic diastolic (congestive) heart failure: Secondary | ICD-10-CM | POA: Diagnosis not present

## 2016-12-28 DIAGNOSIS — I2699 Other pulmonary embolism without acute cor pulmonale: Secondary | ICD-10-CM | POA: Diagnosis not present

## 2016-12-29 ENCOUNTER — Ambulatory Visit: Payer: Medicare Other | Admitting: Internal Medicine

## 2016-12-29 DIAGNOSIS — I6309 Cerebral infarction due to thrombosis of other precerebral artery: Secondary | ICD-10-CM | POA: Diagnosis not present

## 2016-12-29 DIAGNOSIS — I5032 Chronic diastolic (congestive) heart failure: Secondary | ICD-10-CM | POA: Diagnosis not present

## 2016-12-29 DIAGNOSIS — I2699 Other pulmonary embolism without acute cor pulmonale: Secondary | ICD-10-CM | POA: Diagnosis not present

## 2016-12-29 DIAGNOSIS — J9601 Acute respiratory failure with hypoxia: Secondary | ICD-10-CM | POA: Diagnosis not present

## 2017-01-01 ENCOUNTER — Ambulatory Visit (INDEPENDENT_AMBULATORY_CARE_PROVIDER_SITE_OTHER): Payer: Medicare Other | Admitting: Family Medicine

## 2017-01-01 VITALS — BP 116/70 | HR 59 | Temp 98.4°F

## 2017-01-01 DIAGNOSIS — I6309 Cerebral infarction due to thrombosis of other precerebral artery: Secondary | ICD-10-CM | POA: Diagnosis not present

## 2017-01-01 DIAGNOSIS — M545 Low back pain, unspecified: Secondary | ICD-10-CM

## 2017-01-01 DIAGNOSIS — M7502 Adhesive capsulitis of left shoulder: Secondary | ICD-10-CM

## 2017-01-01 DIAGNOSIS — J9601 Acute respiratory failure with hypoxia: Secondary | ICD-10-CM | POA: Diagnosis not present

## 2017-01-01 DIAGNOSIS — R531 Weakness: Secondary | ICD-10-CM | POA: Diagnosis not present

## 2017-01-01 DIAGNOSIS — I5032 Chronic diastolic (congestive) heart failure: Secondary | ICD-10-CM | POA: Diagnosis not present

## 2017-01-01 DIAGNOSIS — I2699 Other pulmonary embolism without acute cor pulmonale: Secondary | ICD-10-CM | POA: Diagnosis not present

## 2017-01-01 MED ORDER — METHYLPREDNISOLONE ACETATE 40 MG/ML IJ SUSP
40.0000 mg | Freq: Once | INTRAMUSCULAR | Status: AC
  Administered 2017-01-01: 40 mg via INTRAMUSCULAR

## 2017-01-01 NOTE — Patient Instructions (Signed)
Thank you for coming in,   Please follow up with me in 3-4 weeks.   Please try different topical creams that you can rub on the lower back.   Please do the exercises for the shoulder.    Please feel free to call with any questions or concerns at any time, at [phone]. --Dr. Raeford Razor

## 2017-01-01 NOTE — Progress Notes (Signed)
Heather [name] - 83 y.o. female MRN [id number]  Date of birth: [birth year]  SUBJECTIVE:  Including CC & ROS.  Chief Complaint  Patient presents with  . Back Pain    Patient is here today C/O LBP for a couple of months and has progressed.  She states that it radiates down both legs to knees.  . Shoulder Pain    Patient is also C/O right shoulder pain for approx 6 months.  Has to be positioned a certain way during the night. Dr. Jenny Reichmann has scheduled PT for her but it has not been started yet so she does not lift them much.    Ms. Heather [name] is a 83 yo F that is presenting with left shoulder pain and lower back pain. The shoulder has been hurting for about 6 months and has gotten worse. The pain is an ache and can radiate down to her shoulder. Has not tried any medications or exercises. She denies any injury or prior surgery. The pain is worse with lying on that side. Nothing seems to help.   Low back pain has gotten worse in the last month. The pain is in her lower back with no radicular symptoms. History obtained from her daughter reports that the patient lies in bed most days and the family members have to reposition her. She was more ambulatory but had several mini strokes which has caused her to be more bed bound. Also her back pain seems to cause her to want to stay in bed as well. She has weakness in her legs and pain with transitioned from lying to sitting. No prior injury to her back and no prior surgery. No numbness in her legs. Is being pushed in a wheel chair to the office today.    I have independently reviewed her left shoulder xray from 10/22/16 which shows some arthritic change of the [facility] joint. Review of bone scan from 08/01/13 shows low bone mass.   Review of Systems  Constitutional: Negative for fever.  Respiratory: Negative for shortness of breath.   Cardiovascular: Positive for leg swelling. Negative for chest pain.  Gastrointestinal: Negative for abdominal pain.  Endocrine: Negative for  polydipsia.  Genitourinary: Negative for dysuria.  Musculoskeletal: Positive for back pain, gait problem and myalgias.  Skin: Negative for color change.  Neurological: Positive for weakness. Negative for numbness.  Hematological: Negative for adenopathy.  Psychiatric/Behavioral: Negative for agitation.  otherwise negative.   HISTORY: Past Medical, Surgical, Social, and Family History Reviewed & Updated per EMR.   Pertinent Historical Findings include:  Past Medical History:  Diagnosis Date  . ALLERGIC RHINITIS 12/09/2006  . DEGENERATIVE JOINT DISEASE, LEFT KNEE 01/23/2007  . Encephalocele ([place]) 01/23/2007  . FEVER UNSPECIFIED 01/28/2010  . GERD 12/09/2006  . GLUCOSE INTOLERANCE 01/24/2009  . GOUT 12/09/2006  . HYPERCHOLESTEROLEMIA 12/09/2006  . HYPERLIPIDEMIA 12/18/2006  . HYPERTENSION 12/09/2006  . Impaired glucose tolerance 07/26/2010  . MENOPAUSAL DISORDER 01/25/2008    Past Surgical History:  Procedure Laterality Date  . s/p anterior encephalocele repair and craniotomy  2003    Allergies  Allergen Reactions  . Lipitor [Atorvastatin] Other (See Comments)    myalgia  . Codeine Other (See Comments)  . Lovastatin Other (See Comments)  . Pravastatin Sodium Other (See Comments)    Family History  Problem Relation Age of Onset  . Hypertension Other      Social History   Social History  . Marital status: Married    Spouse name: N/A  . Number  of children: 7  . Years of education: N/A   Occupational History  . Beauty operateo/hair stylist    Social History Main Topics  . Smoking status: Never Smoker  . Smokeless tobacco: Never Used  . Alcohol use No  . Drug use: No  . Sexual activity: Not on file   Other Topics Concern  . Not on file   Social History Narrative  . No narrative on file     PHYSICAL EXAM:  VS: BP 116/70 (BP Location: Right Arm, Patient Position: Sitting, Cuff Size: Normal)   Pulse (!) 59   Temp 98.4 F (36.9 C) (Oral)   SpO2 95%    Physical Exam Gen: NAD, alert, cooperative with exam, well-appearing ENT: normal lips, normal nasal mucosa,  Eye: normal EOM, normal conjunctiva and lids CV:  no edema, +2 pedal pulses   Resp: no accessory muscle use, non-labored,  GI: no masses or tenderness, no hernia  Skin: no rashes, no areas of induration  Neuro: normal tone, normal sensation to touch Psych:  normal insight, alert and oriented MSK:  Left shoulder:  Shoulder: Inspection reveals no abnormalities, atrophy or asymmetry. Palpation is normal with no tenderness over AC joint  ROM is limited in ER  Pain with ER and abduction  Rotator cuff strength normal throughout. No signs of impingement with negative empty can sign. Back:  Weakness with hip flexion strength to gravity R>L  Weakness with knee extension to gravity R>L Sensation intact in LE  Did not attempt to get her to stand today  IR and ER ROM of the hips normal  Neurovascularly intact    Aspiration/Injection Procedure Note DANYETTA GILLHAM 10/07/1928  Procedure: Injection Indications: left shoulder pain   Procedure Details Consent: Risks of procedure as well as the alternatives and risks of each were explained to the (patient/caregiver).  Consent for procedure obtained. Time Out: Verified patient identification, verified procedure, site/side was marked, verified correct patient position, special equipment/implants available, medications/allergies/relevent history reviewed, required imaging and test results available.  Performed.  The area was cleaned with iodine and alcohol swabs.    The left shoulder glenohumeral joint was injected using 1 cc's of 40 mg Depomedrol and 4 cc's of 05% marcaine with 3" needle.  Ultrasound was used. Images were obtained in Transverse views showing the injection.    A sterile dressing was applied.  Patient did tolerate procedure well.         ASSESSMENT & PLAN:   Adhesive capsulitis of left shoulder Her shoulder has  lack of ROM in ER as well as loss of passive ROM. Her right shoulder has normal ROM passively.  - [place] injection today  - provided and counseled on exercises  - may need referral to PT going forward or repeat injection   General weakness Seems a lack of motivation to move. Likely related to deconditioning.  - encouraged her daughter to try to get her to move on a regular basis.   Acute bilateral low back pain without sciatica Pain seems related to a muscular problem of her lower back. No imaging to review.  - referral to PT  - can apply OTC topical ointments.  - encouraged to get out of bed as much as possible  - would consider imaging at follow up  - counseled on HEP

## 2017-01-02 DIAGNOSIS — J9611 Chronic respiratory failure with hypoxia: Secondary | ICD-10-CM | POA: Diagnosis not present

## 2017-01-02 DIAGNOSIS — M545 Low back pain, unspecified: Secondary | ICD-10-CM | POA: Insufficient documentation

## 2017-01-02 DIAGNOSIS — I872 Venous insufficiency (chronic) (peripheral): Secondary | ICD-10-CM | POA: Diagnosis not present

## 2017-01-02 DIAGNOSIS — I110 Hypertensive heart disease with heart failure: Secondary | ICD-10-CM | POA: Diagnosis not present

## 2017-01-02 DIAGNOSIS — M1712 Unilateral primary osteoarthritis, left knee: Secondary | ICD-10-CM | POA: Diagnosis not present

## 2017-01-02 DIAGNOSIS — L988 Other specified disorders of the skin and subcutaneous tissue: Secondary | ICD-10-CM | POA: Diagnosis not present

## 2017-01-02 DIAGNOSIS — F0391 Unspecified dementia with behavioral disturbance: Secondary | ICD-10-CM | POA: Diagnosis not present

## 2017-01-02 DIAGNOSIS — M109 Gout, unspecified: Secondary | ICD-10-CM | POA: Diagnosis not present

## 2017-01-02 DIAGNOSIS — M7502 Adhesive capsulitis of left shoulder: Secondary | ICD-10-CM | POA: Insufficient documentation

## 2017-01-02 DIAGNOSIS — I69354 Hemiplegia and hemiparesis following cerebral infarction affecting left non-dominant side: Secondary | ICD-10-CM | POA: Diagnosis not present

## 2017-01-02 DIAGNOSIS — I5032 Chronic diastolic (congestive) heart failure: Secondary | ICD-10-CM | POA: Diagnosis not present

## 2017-01-02 DIAGNOSIS — L89313 Pressure ulcer of right buttock, stage 3: Secondary | ICD-10-CM | POA: Diagnosis not present

## 2017-01-02 NOTE — Assessment & Plan Note (Signed)
Pain seems related to a muscular problem of her lower back. No imaging to review.  - referral to PT  - can apply OTC topical ointments.  - encouraged to get out of bed as much as possible  - would consider imaging at follow up  - counseled on HEP

## 2017-01-02 NOTE — Assessment & Plan Note (Signed)
Seems a lack of motivation to move. Likely related to deconditioning.  - encouraged her daughter to try to get her to move on a regular basis.

## 2017-01-02 NOTE — Assessment & Plan Note (Signed)
Her shoulder has lack of ROM in ER as well as loss of passive ROM. Her right shoulder has normal ROM passively.  - [place] injection today  - provided and counseled on exercises  - may need referral to PT going forward or repeat injection

## 2017-01-05 ENCOUNTER — Telehealth: Payer: Self-pay | Admitting: Internal Medicine

## 2017-01-05 NOTE — Telephone Encounter (Signed)
Ok for verbal 

## 2017-01-05 NOTE — Telephone Encounter (Signed)
Verbal orders for PT  1x1 2x4  [phone] - [place]

## 2017-01-05 NOTE — Telephone Encounter (Signed)
LVM with ok for verbal

## 2017-01-06 ENCOUNTER — Telehealth: Payer: Self-pay | Admitting: Internal Medicine

## 2017-01-06 NOTE — Telephone Encounter (Signed)
Ok for verbals 

## 2017-01-06 NOTE — Telephone Encounter (Signed)
Called for verbals for skilled nursing for 1wee9 For assessment, disease management and wound care.   And OT evaluation

## 2017-01-07 NOTE — Telephone Encounter (Signed)
Notified [place] w/MD response...Heather [name]

## 2017-01-21 NOTE — Telephone Encounter (Signed)
FYI PT called and due to the weather they will not be seeing the patient today and will start back next week.

## 2017-01-25 ENCOUNTER — Encounter (HOSPITAL_COMMUNITY): Payer: Self-pay | Admitting: Emergency Medicine

## 2017-01-25 ENCOUNTER — Emergency Department (HOSPITAL_COMMUNITY): Payer: Medicare Other

## 2017-01-25 ENCOUNTER — Observation Stay (HOSPITAL_COMMUNITY)
Admission: EM | Admit: 2017-01-25 | Discharge: 2017-01-27 | Disposition: A | Discharge status: Home / Self Care | Payer: Medicare Other | Attending: Internal Medicine | Admitting: Internal Medicine

## 2017-01-25 ENCOUNTER — Observation Stay (HOSPITAL_COMMUNITY): Payer: Medicare Other

## 2017-01-25 DIAGNOSIS — Z8673 Personal history of transient ischemic attack (TIA), and cerebral infarction without residual deficits: Secondary | ICD-10-CM | POA: Insufficient documentation

## 2017-01-25 DIAGNOSIS — N179 Acute kidney failure, unspecified: Secondary | ICD-10-CM | POA: Insufficient documentation

## 2017-01-25 DIAGNOSIS — I10 Essential (primary) hypertension: Secondary | ICD-10-CM | POA: Diagnosis not present

## 2017-01-25 DIAGNOSIS — J9611 Chronic respiratory failure with hypoxia: Secondary | ICD-10-CM | POA: Diagnosis not present

## 2017-01-25 DIAGNOSIS — M8580 Other specified disorders of bone density and structure, unspecified site: Secondary | ICD-10-CM | POA: Insufficient documentation

## 2017-01-25 DIAGNOSIS — I130 Hypertensive heart and chronic kidney disease with heart failure and stage 1 through stage 4 chronic kidney disease, or unspecified chronic kidney disease: Secondary | ICD-10-CM | POA: Insufficient documentation

## 2017-01-25 DIAGNOSIS — M545 Low back pain: Secondary | ICD-10-CM | POA: Insufficient documentation

## 2017-01-25 DIAGNOSIS — Z86718 Personal history of other venous thrombosis and embolism: Secondary | ICD-10-CM | POA: Diagnosis present

## 2017-01-25 DIAGNOSIS — R29818 Other symptoms and signs involving the nervous system: Secondary | ICD-10-CM | POA: Diagnosis not present

## 2017-01-25 DIAGNOSIS — M25511 Pain in right shoulder: Secondary | ICD-10-CM | POA: Insufficient documentation

## 2017-01-25 DIAGNOSIS — R4182 Altered mental status, unspecified: Secondary | ICD-10-CM | POA: Diagnosis not present

## 2017-01-25 DIAGNOSIS — Z86711 Personal history of pulmonary embolism: Secondary | ICD-10-CM | POA: Diagnosis not present

## 2017-01-25 DIAGNOSIS — G459 Transient cerebral ischemic attack, unspecified: Secondary | ICD-10-CM | POA: Diagnosis not present

## 2017-01-25 DIAGNOSIS — R269 Unspecified abnormalities of gait and mobility: Secondary | ICD-10-CM | POA: Diagnosis not present

## 2017-01-25 DIAGNOSIS — R404 Transient alteration of awareness: Secondary | ICD-10-CM | POA: Diagnosis not present

## 2017-01-25 DIAGNOSIS — Z7901 Long term (current) use of anticoagulants: Secondary | ICD-10-CM | POA: Diagnosis not present

## 2017-01-25 DIAGNOSIS — E7439 Other disorders of intestinal carbohydrate absorption: Secondary | ICD-10-CM | POA: Insufficient documentation

## 2017-01-25 DIAGNOSIS — G9341 Metabolic encephalopathy: Principal | ICD-10-CM | POA: Diagnosis present

## 2017-01-25 DIAGNOSIS — E7800 Pure hypercholesterolemia, unspecified: Secondary | ICD-10-CM | POA: Diagnosis not present

## 2017-01-25 DIAGNOSIS — Z8249 Family history of ischemic heart disease and other diseases of the circulatory system: Secondary | ICD-10-CM | POA: Diagnosis not present

## 2017-01-25 DIAGNOSIS — I872 Venous insufficiency (chronic) (peripheral): Secondary | ICD-10-CM | POA: Insufficient documentation

## 2017-01-25 DIAGNOSIS — F03918 Unspecified dementia, unspecified severity, with other behavioral disturbance: Secondary | ICD-10-CM | POA: Diagnosis present

## 2017-01-25 DIAGNOSIS — G4700 Insomnia, unspecified: Secondary | ICD-10-CM | POA: Diagnosis not present

## 2017-01-25 DIAGNOSIS — I5032 Chronic diastolic (congestive) heart failure: Secondary | ICD-10-CM | POA: Diagnosis present

## 2017-01-25 DIAGNOSIS — Z993 Dependence on wheelchair: Secondary | ICD-10-CM | POA: Insufficient documentation

## 2017-01-25 DIAGNOSIS — K219 Gastro-esophageal reflux disease without esophagitis: Secondary | ICD-10-CM | POA: Diagnosis not present

## 2017-01-25 DIAGNOSIS — R29810 Facial weakness: Secondary | ICD-10-CM

## 2017-01-25 DIAGNOSIS — Z87728 Personal history of other specified (corrected) congenital malformations of nervous system and sense organs: Secondary | ICD-10-CM | POA: Insufficient documentation

## 2017-01-25 DIAGNOSIS — F0391 Unspecified dementia with behavioral disturbance: Secondary | ICD-10-CM | POA: Insufficient documentation

## 2017-01-25 DIAGNOSIS — Z79899 Other long term (current) drug therapy: Secondary | ICD-10-CM | POA: Insufficient documentation

## 2017-01-25 DIAGNOSIS — G9389 Other specified disorders of brain: Secondary | ICD-10-CM | POA: Diagnosis not present

## 2017-01-25 DIAGNOSIS — R531 Weakness: Secondary | ICD-10-CM | POA: Diagnosis not present

## 2017-01-25 DIAGNOSIS — E785 Hyperlipidemia, unspecified: Secondary | ICD-10-CM | POA: Diagnosis present

## 2017-01-25 DIAGNOSIS — R351 Nocturia: Secondary | ICD-10-CM | POA: Insufficient documentation

## 2017-01-25 DIAGNOSIS — Z9114 Patient's other noncompliance with medication regimen: Secondary | ICD-10-CM | POA: Insufficient documentation

## 2017-01-25 DIAGNOSIS — M109 Gout, unspecified: Secondary | ICD-10-CM | POA: Diagnosis present

## 2017-01-25 DIAGNOSIS — N183 Chronic kidney disease, stage 3 (moderate): Secondary | ICD-10-CM | POA: Diagnosis not present

## 2017-01-25 DIAGNOSIS — M1712 Unilateral primary osteoarthritis, left knee: Secondary | ICD-10-CM | POA: Diagnosis not present

## 2017-01-25 DIAGNOSIS — R569 Unspecified convulsions: Secondary | ICD-10-CM | POA: Diagnosis not present

## 2017-01-25 DIAGNOSIS — I6789 Other cerebrovascular disease: Secondary | ICD-10-CM | POA: Diagnosis not present

## 2017-01-25 DIAGNOSIS — I2699 Other pulmonary embolism without acute cor pulmonale: Secondary | ICD-10-CM | POA: Diagnosis present

## 2017-01-25 HISTORY — DX: Cerebral infarction, unspecified: I63.9

## 2017-01-25 LAB — COMPREHENSIVE METABOLIC PANEL
ALT: 14 U/L (ref 14–54)
AST: 22 U/L (ref 15–41)
Albumin: 2.6 g/dL — ABNORMAL LOW (ref 3.5–5.0)
Alkaline Phosphatase: 73 U/L (ref 38–126)
Anion gap: 14 (ref 5–15)
BUN: 34 mg/dL — ABNORMAL HIGH (ref 6–20)
CO2: 21 mmol/L — ABNORMAL LOW (ref 22–32)
Calcium: 8.5 mg/dL — ABNORMAL LOW (ref 8.9–10.3)
Chloride: 105 mmol/L (ref 101–111)
Creatinine, Ser: 1.59 mg/dL — ABNORMAL HIGH (ref 0.44–1.00)
GFR calc Af Amer: 33 mL/min — ABNORMAL LOW (ref >60)
GFR calc non Af Amer: 28 mL/min — ABNORMAL LOW (ref >60)
Glucose, Bld: 138 mg/dL — ABNORMAL HIGH (ref 65–99)
Potassium: 3.6 mmol/L (ref 3.5–5.1)
Sodium: 140 mmol/L (ref 135–145)
Total Bilirubin: 0.5 mg/dL (ref 0.3–1.2)
Total Protein: 6.2 g/dL — ABNORMAL LOW (ref 6.5–8.1)

## 2017-01-25 LAB — I-STAT CHEM 8, ED
BUN: 34 mg/dL — ABNORMAL HIGH (ref 6–20)
Calcium, Ion: 1.07 mmol/L — ABNORMAL LOW (ref 1.15–1.40)
Chloride: 103 mmol/L (ref 101–111)
Creatinine, Ser: 1.6 mg/dL — ABNORMAL HIGH (ref 0.44–1.00)
Glucose, Bld: 138 mg/dL — ABNORMAL HIGH (ref 65–99)
HCT: 35 % — ABNORMAL LOW (ref 36.0–46.0)
Hemoglobin: 11.9 g/dL — ABNORMAL LOW (ref 12.0–15.0)
Potassium: 3.5 mmol/L (ref 3.5–5.1)
Sodium: 140 mmol/L (ref 135–145)
TCO2: 25 mmol/L (ref 22–32)

## 2017-01-25 LAB — DIFFERENTIAL
Basophils Absolute: 0.1 10*3/uL (ref 0.0–0.1)
Basophils Relative: 1 %
Eosinophils Absolute: 0.4 10*3/uL (ref 0.0–0.7)
Eosinophils Relative: 6 %
Lymphocytes Relative: 32 %
Lymphs Abs: 2.1 10*3/uL (ref 0.7–4.0)
Monocytes Absolute: 0.9 10*3/uL (ref 0.1–1.0)
Monocytes Relative: 13 %
Neutro Abs: 3.2 10*3/uL (ref 1.7–7.7)
Neutrophils Relative %: 48 %

## 2017-01-25 LAB — CBC
HCT: 35.2 % — ABNORMAL LOW (ref 36.0–46.0)
Hemoglobin: 11.4 g/dL — ABNORMAL LOW (ref 12.0–15.0)
MCH: 28.2 pg (ref 26.0–34.0)
MCHC: 32.4 g/dL (ref 30.0–36.0)
MCV: 87.1 fL (ref 78.0–100.0)
Platelets: 256 10*3/uL (ref 150–400)
RBC: 4.04 MIL/uL (ref 3.87–5.11)
RDW: 17.6 % — ABNORMAL HIGH (ref 11.5–15.5)
WBC: 6.6 10*3/uL (ref 4.0–10.5)

## 2017-01-25 LAB — ETHANOL: Alcohol, Ethyl (B): <10 mg/dL (ref <10)

## 2017-01-25 LAB — I-STAT TROPONIN, ED: Troponin i, poc: 0 ng/mL (ref 0.00–0.08)

## 2017-01-25 LAB — APTT: aPTT: 46 seconds — ABNORMAL HIGH (ref 24–36)

## 2017-01-25 LAB — PROTIME-INR
INR: 2.43
Prothrombin Time: 26.2 seconds — ABNORMAL HIGH (ref 11.4–15.2)

## 2017-01-25 MED ORDER — TRAZODONE HCL 50 MG PO TABS: 25.0000 mg | ORAL_TABLET | Freq: Every evening | ORAL | Status: DC | PRN

## 2017-01-25 MED ORDER — LORAZEPAM 2 MG/ML IJ SOLN
0.5000 mg | Freq: Once | INTRAMUSCULAR | Status: AC
  Administered 2017-01-25: 0.5 mg via INTRAVENOUS
  Filled 2017-01-25: qty 1

## 2017-01-25 MED ORDER — FUROSEMIDE 40 MG PO TABS: 40.0000 mg | ORAL_TABLET | Freq: Every day | ORAL | Status: DC | PRN

## 2017-01-25 MED ORDER — CARVEDILOL 12.5 MG PO TABS
25.0000 mg | ORAL_TABLET | Freq: Two times a day (BID) | ORAL | Status: DC
  Administered 2017-01-26 – 2017-01-27 (×4): 25 mg via ORAL
  Filled 2017-01-25 (×4): qty 2

## 2017-01-25 MED ORDER — QUETIAPINE FUMARATE 50 MG PO TABS
50.0000 mg | ORAL_TABLET | Freq: Every day | ORAL | Status: DC
  Administered 2017-01-26 – 2017-01-27 (×2): 50 mg via ORAL
  Filled 2017-01-25 (×2): qty 1

## 2017-01-25 MED ORDER — SODIUM CHLORIDE 0.9 % IV SOLN
75.0000 mL/h | INTRAVENOUS | Status: DC
  Administered 2017-01-25 – 2017-01-26 (×2): 75 mL/h via INTRAVENOUS

## 2017-01-25 MED ORDER — AMLODIPINE BESYLATE 5 MG PO TABS
5.0000 mg | ORAL_TABLET | Freq: Every day | ORAL | Status: DC
  Administered 2017-01-26 – 2017-01-27 (×2): 5 mg via ORAL
  Filled 2017-01-25 (×2): qty 1

## 2017-01-25 MED ORDER — SENNOSIDES-DOCUSATE SODIUM 8.6-50 MG PO TABS: 1.0000 | ORAL_TABLET | Freq: Every evening | ORAL | Status: DC | PRN

## 2017-01-25 MED ORDER — RIVAROXABAN 20 MG PO TABS
20.0000 mg | ORAL_TABLET | Freq: Every day | ORAL | Status: DC
  Administered 2017-01-25 – 2017-01-27 (×3): 20 mg via ORAL
  Filled 2017-01-25 (×3): qty 1

## 2017-01-25 MED ORDER — BENAZEPRIL HCL 20 MG PO TABS
40.0000 mg | ORAL_TABLET | Freq: Every day | ORAL | Status: DC
  Administered 2017-01-26 – 2017-01-27 (×2): 40 mg via ORAL
  Filled 2017-01-25: qty 1
  Filled 2017-01-25 (×2): qty 2

## 2017-01-25 MED ORDER — DONEPEZIL HCL 10 MG PO TABS
10.0000 mg | ORAL_TABLET | Freq: Every day | ORAL | Status: DC
  Administered 2017-01-26 – 2017-01-27 (×2): 10 mg via ORAL
  Filled 2017-01-25 (×2): qty 1

## 2017-01-25 MED ORDER — ONDANSETRON HCL 4 MG/2ML IJ SOLN: 4.0000 mg | Freq: Four times a day (QID) | INTRAMUSCULAR | Status: DC | PRN

## 2017-01-25 MED ORDER — GADOBENATE DIMEGLUMINE 529 MG/ML IV SOLN
10.0000 mL | Freq: Once | INTRAVENOUS | Status: AC | PRN
  Administered 2017-01-25: 10 mL via INTRAVENOUS

## 2017-01-25 MED ORDER — ONDANSETRON HCL 4 MG PO TABS: 4.0000 mg | ORAL_TABLET | Freq: Four times a day (QID) | ORAL | Status: DC | PRN

## 2017-01-25 MED ORDER — ONDANSETRON HCL 4 MG/2ML IJ SOLN
4.0000 mg | Freq: Once | INTRAMUSCULAR | Status: AC
  Administered 2017-01-25: 4 mg via INTRAVENOUS
  Filled 2017-01-25: qty 2

## 2017-01-25 NOTE — ED Triage Notes (Signed)
Per EMS: pt coming from home. LKW is 1300. Pt had AMS and L facial droop. Has since resolved.  Family c/o Pt coughing up bloody flem.  Hx of TIA. CBG 176

## 2017-01-25 NOTE — ED Notes (Signed)
Pt currently vomiting, traces of blood in bile.

## 2017-01-25 NOTE — Consult Note (Signed)
Neurology Consultation  Reason for Consult: Code stroke Referring Physician: Dr. Asa Lente provider  CC: left facial weakness and altered mental status that has resolved  History is obtained from:EMS, later by patient's daughters were at bedside.  HPI: Heather [name] is a 83 y.o. female past medical history ofdegenerative joint disease, hypertension, hypercholesterolemia, and a dementia, bilateral lower extremity DVTs on RIVAROXABAN ,who lives at home with his daughter, and gets help with a visiting aide, who was at home this morning and noticed that the patient was not answering questions appropriately and had weakness/drooping of the leftlower face. EMS was called and the patient was brought into the emergency room for emergent evaluation as an acute code stroke. Patient does not remember that episode. For that matter, patient cannot provide much of the history. She can follow simple commands, and is oriented to self only. The patient has had problems with her short-term memory for many years and has been on treatment for her dementia for the past 10 months. 20 years ago, patient had a sinus surgery with craniotomy for a sinus infection and meningitis. No history of seizures in the past. The family was at the bedside today, noticed that she was blankly staring, not following commands and had left lower facial weakness that lasted for a few minutes when this episode happened, for which the EMS was called. The family denied any preceding fevers, chills. Upon EMS arrival, the patient's systolic blood pressures were below 50. She also was coughing up sputum that was blood-tinged. The patient has received flu shot 4 weeks ago.  LKW: 1300 hrs. On 01/25/2017 tpa given?: no, exam consistent with stroke, symptoms resolved, Xarelto Premorbid modified Rankin scale (mRS): 4   ROS: A 14 point ROS was performed and is negative except as noted in the HPI.   Past Medical History:  Diagnosis Date  .  ALLERGIC RHINITIS 12/09/2006  . DEGENERATIVE JOINT DISEASE, LEFT KNEE 01/23/2007  . Encephalocele ([place]) 01/23/2007  . FEVER UNSPECIFIED 01/28/2010  . GERD 12/09/2006  . GLUCOSE INTOLERANCE 01/24/2009  . GOUT 12/09/2006  . HYPERCHOLESTEROLEMIA 12/09/2006  . HYPERLIPIDEMIA 12/18/2006  . HYPERTENSION 12/09/2006  . Impaired glucose tolerance 07/26/2010  . MENOPAUSAL DISORDER 01/25/2008  past surgical history Bifrontal craniotomy, sinus surgery 20 years ago  Family History  Problem Relation Age of Onset  . Hypertension Other      Social History:   reports that she has never smoked. She has never used smokeless tobacco. She reports that she does not drink alcohol or use drugs.  Medications No current facility-administered medications for this encounter.   Current Outpatient Prescriptions:  .  acetaminophen (TYLENOL) 500 MG tablet, Take 500 mg by mouth every 6 (six) hours as needed for moderate pain (knee, leg, stomach)., Disp: , Rfl:  .  amLODipine (NORVASC) 5 MG tablet, Take 1 tablet (5 mg total) by mouth daily., Disp: 90 tablet, Rfl: 3 .  benazepril (LOTENSIN) 40 MG tablet, Take 40 mg by mouth daily., Disp: , Rfl: 0 .  carvedilol (COREG) 25 MG tablet, Take 1 tablet (25 mg total) by mouth 2 (two) times daily with a meal., Disp: 180 tablet, Rfl: 3 .  donepezil (ARICEPT) 10 MG tablet, Take 1 tablet daily, Disp: 90 tablet, Rfl: 3 .  furosemide (LASIX) 40 MG tablet, Take 1 tablet (40 mg total) by mouth daily as needed. (Patient not taking: Reported on 01/01/2017), Disp: 90 tablet, Rfl: 3 .  nystatin (MYCOSTATIN/NYSTOP) powder, Use as directed twice per day as needed (  Patient taking differently: Use as directed twice per day as needed for itching), Disp: 56.7 g, Rfl: 5 .  QUEtiapine (SEROQUEL) 50 MG tablet, 1 tab by mouth in the AM, Disp: 90 tablet, Rfl: 3 .  rivaroxaban (XARELTO) 20 MG TABS tablet, Take 1 tablet (20 mg total) by mouth daily with supper., Disp: 30 tablet, Rfl: 11 .  traMADol  (ULTRAM) 50 MG tablet, Take 1 tablet (50 mg total) by mouth every 8 (eight) hours as needed. (Patient not taking: Reported on 01/01/2017), Disp: 90 tablet, Rfl: 2 .  traZODone (DESYREL) 50 MG tablet, Take 0.5-1 tablets (25-50 mg total) by mouth at bedtime as needed for sleep., Disp: 30 tablet, Rfl: 5  Exam: Current vital signs: BP 114/64   Pulse (!) 56   Temp 97.9 F (36.6 C) (Oral)   Resp 17   Ht 5\' 4"  (1.626 m)   Wt 113.4 kg (250 lb)   SpO2 95%   BMI 42.91 kg/m  Vital signs in last 24 hours: Temp:  [97.9 F (36.6 C)] 97.9 F (36.6 C) (10/15 1401) Pulse Rate:  [56] 56 (10/15 1401) Resp:  [17] 17 (10/15 1401) BP: (114)/(64) 114/64 (10/15 1401) SpO2:  [95 %] 95 % (10/15 1401) Weight:  [113.4 kg (250 lb)] 113.4 kg (250 lb) (10/15 1401) GENERAL: Awake, alert in NAD HEENT: - Normocephalic and atraumatic, dry mm, no LN++, no Thyromegally LUNGS - Clear to auscultation bilaterally with no wheezes CV - S1S2 RRR, no m/r/g, equal pulses bilaterally. ABDOMEN - Soft, nontender, nondistended with normoactive BS Ext: warm, well perfused, intact peripheral pulses, 2+edema  NEURO:  Mental Status: AA&Ox1 Language: speech isclear.  Naming, repetition, fluency, and comprehension intact. Cranial Nerves: PERRL 10mm/brisk. EOMI, visual fields full, no facial asymmetry, facial sensation intact, hearing intact, tongue/uvula/soft palate midline, normal sternocleidomastoid and trapezius muscle strength. No evidence of tongue atrophy or fibrillations Motor: nearly symmetric antigravity both upper extremities with the caveat that the left shoulder has decreased range of motion. 2/5 in both lower extremities. According to family, her strength is at baseline in all 4 extremities at this time. Tone: is normal and bulk is normal Sensation- Intact to light touch bilaterally Coordination: FTN intact bilaterally, unable to perform heel-knee-shin bilaterally Gait- non-ambulatory baseline  NIHSS - 8 (3 right leg,  3 left leg, 2 LOC questions)   Labs I have reviewed labs in epic and the results pertinent to this consultation are:  CBC    Component Value Date/Time   WBC 4.4 12/15/2016 1129   RBC 4.68 12/15/2016 1129   HGB 12.8 12/15/2016 1129   HCT 40.3 12/15/2016 1129   PLT 300.0 12/15/2016 1129   MCV 86.0 12/15/2016 1129   MCH 27.2 10/22/2016 1630   MCHC 31.7 12/15/2016 1129   RDW 15.3 12/15/2016 1129   LYMPHSABS 1.5 12/15/2016 1129   MONOABS 0.7 12/15/2016 1129   EOSABS 0.3 12/15/2016 1129   BASOSABS 0.1 12/15/2016 1129    CMP     Component Value Date/Time   NA 140 01/25/2017 1340   K 3.5 01/25/2017 1340   CL 103 01/25/2017 1340   CO2 PENDING 01/25/2017 1340   GLUCOSE 138 (H) 01/25/2017 1340   BUN 34 (H) 01/25/2017 1340   CREATININE 1.60 (H) 01/25/2017 1340   CALCIUM PENDING 01/25/2017 1340   PROT PENDING 01/25/2017 1340   ALBUMIN PENDING 01/25/2017 1340   AST PENDING 01/25/2017 1340   ALT PENDING 01/25/2017 1340   ALKPHOS PENDING 01/25/2017 1340   BILITOT PENDING 01/25/2017  [place] 01/25/2017 1340   GFRAA PENDING 01/25/2017 1340    Lipid Panel     Component Value Date/Time   CHOL 212 (H) 12/15/2016 1129   TRIG 189.0 (H) 12/15/2016 1129   TRIG 81 04/14/2006 0857   HDL 31.80 (L) 12/15/2016 1129   CHOLHDL 7 12/15/2016 1129   VLDL 37.8 12/15/2016 1129   LDLCALC 142 (H) 12/15/2016 1129   LDLDIRECT 139.0 01/01/2016 0958   Imaging I have reviewed the images obtained:  CT-scan of the brain - no acute changes. Evidence ofencephalomalacia in the inferior medial frontal lobes bilaterally and right parietal lobe-stable from prior MRI. Stable chronic microvascular changes. Cranial defect between the right anterior cranial fossa on the right sphenoid sinus with a hyperdense mass like structure stable from prior imaging.  Assessment: 83 year old woman with past medical history of hypertension, hypercholesterolemia, dementia, bilateral lower extremity DVTs and  prior stroke with no residual deficits was brought in for evaluation of sudden onset of left-sided facial droop and altered mental status which was described as her being unable to respond to questions and appearing s if she is on a blank stare. Noncontrast CT of the head shows no acute changes. I suspect that her symptoms were related to hypotension, she was noted to be hypotensive with systolic blood pressure in the 50s at the time of presentation. Another differential to consider his seizure activity.  Impression Evaluate for stroke Evaluate for posterior circulation insufficiency Evaluate for seizure  Recommendations: EEG No antiepileptic for now MRI brain without contrast MRA of the brain without contras MRA of the neck with contrast Seizure precautions Continue with Xarelto I would not recommend doing anything further for stroke workup unless the MRI is positive for stroke. Further recommendations based on clinical course and testing results.  --  Amie Portland, MD [facility] [phone]  If 7pm to 7am, please call on call as listed on AMION.

## 2017-01-25 NOTE — Code Documentation (Signed)
83yo female arriving to [facility] via [place] at 76.  Patient noted to have left facial droop and altered mental status, LKW 1300.  Patient with h/o stroke, DVT and dementia on Xarelto.  Patient with resolving symptoms en route per EMS.  Stroke team to the bedside.  CT completed.  NIHSS 8, see documentation for details and code stroke times.  Patient with bilateral leg weakness on exam (patient is wheelchair bound at baseline and missed both questions (dementia at baseline).  Dr. Rory Percy at the bedside.  Patient is contraindicated for tPA d/t taking Xarelto.  No acute stroke treatment at this time.  Code stroke canceled per Dr. Rory Percy.  Bedside handoff with ED RN Myriam Jacobson.

## 2017-01-25 NOTE — ED Notes (Signed)
Pt SPO2 was 89% placed Pt on 2L [place]

## 2017-01-25 NOTE — H&P (Signed)
History and Physical    Heather [name]:[id number] DOB: 17-Jun-1928 DOA: 01/25/2017   PCP: Biagio Borg, MD   Patient coming from:  Home    Chief Complaint: Cnfusion and facial droop  HPI: Heather [name] is a 83 y.o. female with medical history significant for HTN, HLD, history of dementia, history of bilateral lower extremity DVTs on Xarelto, bruits to the ED after the patient was not answering questions appropriately,with noted left lower face drooping. The patient does not remember that episode. However, she cannot provide history due to dementia. History is obtained by her grandson. No prior history of seizures. No apparent  headaches or vision changes, nausea or vomiting. No trouble swallowing.The family denies any preceding fevers or chills. No recent long distance trips. No recent sick contacts. No apparent chest pain or palpitations. No syncope, presyncope or fall.No apparent respiratory complaints. According to her daughter, the patient is able to urinate on her own without difficulty. No constipation, no worsening lower extremity swelling. The patient is compliant to her medications. Allergies the patient at the ER, and TPA was not given, as the exam was not consistent with stroke.   ED Course:  BP 112/84   Pulse (!) 53   Temp 97.9 F (36.6 C) (Oral)   Resp 14   Ht 5\' 4"  (1.626 m)   Wt 113.4 kg (250 lb)   SpO2 94%   BMI 42.91 kg/m    CT head neg for acute findings  Creatinine 1.59 GFR 33 Troponin 0 CBC is normal PT 26, INR 2.4  Review of Systems:  As per HPI otherwise all other systems reviewed and are negative  Past Medical History:  Diagnosis Date  . ALLERGIC RHINITIS 12/09/2006  . DEGENERATIVE JOINT DISEASE, LEFT KNEE 01/23/2007  . Encephalocele ([place]) 01/23/2007  . FEVER UNSPECIFIED 01/28/2010  . GERD 12/09/2006  . GLUCOSE INTOLERANCE 01/24/2009  . GOUT 12/09/2006  . HYPERCHOLESTEROLEMIA 12/09/2006  . HYPERLIPIDEMIA 12/18/2006  . HYPERTENSION 12/09/2006  .  Impaired glucose tolerance 07/26/2010  . MENOPAUSAL DISORDER 01/25/2008    Past Surgical History:  Procedure Laterality Date  . s/p anterior encephalocele repair and craniotomy  2003    Social History Social History   Social History  . Marital status: Married    Spouse name: N/A  . Number of children: 7  . Years of education: N/A   Occupational History  . Beauty operateo/hair stylist    Social History Main Topics  . Smoking status: Never Smoker  . Smokeless tobacco: Never Used  . Alcohol use No  . Drug use: No  . Sexual activity: Not on file   Other Topics Concern  . Not on file   Social History Narrative  . No narrative on file     Allergies  Allergen Reactions  . Lipitor [Atorvastatin] Other (See Comments)    myalgia  . Codeine Other (See Comments)  . Lovastatin Other (See Comments)  . Pravastatin Sodium Other (See Comments)    Family History  Problem Relation Age of Onset  . Hypertension Other       Prior to Admission medications   Medication Sig Start Date End Date Taking? Authorizing Provider  acetaminophen (TYLENOL) 500 MG tablet Take 500 mg by mouth every 6 (six) hours as needed for moderate pain (knee, leg, stomach).   Yes [provider]  amLODipine (NORVASC) 5 MG tablet Take 1 tablet (5 mg total) by mouth daily. 12/17/16  Yes Biagio Borg, MD  benazepril (LOTENSIN)  40 MG tablet Take 40 mg by mouth daily. 01/01/16  Yes [provider]  carvedilol (COREG) 25 MG tablet Take 1 tablet (25 mg total) by mouth 2 (two) times daily with a meal. 01/01/16  Yes Biagio Borg, MD  donepezil (ARICEPT) 10 MG tablet Take 1 tablet daily 09/08/16  Yes Cameron Sprang, MD  furosemide (LASIX) 40 MG tablet Take 1 tablet (40 mg total) by mouth daily as needed. 12/17/16  Yes Biagio Borg, MD  Multiple Vitamin (MULTIVITAMIN WITH MINERALS) TABS tablet Take 1 tablet by mouth daily. [place]   Yes [provider]  nystatin (MYCOSTATIN/NYSTOP) powder Use  as directed twice per day as needed Patient taking differently: Use as directed twice per day as needed for itching 10/07/16  Yes Biagio Borg, MD  OVER THE COUNTER MEDICATION Take 1 capsule by mouth daily. QUAIL OIL   Yes [provider]  QUEtiapine (SEROQUEL) 50 MG tablet 1 tab by mouth in the AM 09/25/16  Yes Biagio Borg, MD  rivaroxaban (XARELTO) 20 MG TABS tablet Take 1 tablet (20 mg total) by mouth daily with supper. 12/17/16  Yes Biagio Borg, MD  traMADol (ULTRAM) 50 MG tablet Take 1 tablet (50 mg total) by mouth every 8 (eight) hours as needed. 09/25/16  Yes Biagio Borg, MD  traZODone (DESYREL) 50 MG tablet Take 0.5-1 tablets (25-50 mg total) by mouth at bedtime as needed for sleep. 09/25/16  Yes Biagio Borg, MD    Physical Exam:  Vitals:   01/25/17 1400 01/25/17 1401 01/25/17 1500  BP: 117/64 114/64 112/84  Pulse: (!) 51 (!) 56 (!) 53  Resp: 13 17 14   Temp:  97.9 F (36.6 C)   TempSrc:  Oral   SpO2: 93% 95% 94%  Weight:  113.4 kg (250 lb)   Height:  5\' 4"  (1.626 m)    Constitutional: NAD, calm,chronically ill appearing  Eyes: PERRL, lids and conjunctivae normal ENMT: Mucous membranes are moist, without exudate or lesions  Neck: normal, supple, no masses, no thyromegaly Respiratory: clear to auscultation bilaterally, no wheezing, no crackles. Normal respiratory effort  Cardiovascular: Regular rate and rhythm,  murmur, rubs or gallops. No extremity edema. 2+ pedal pulses. No carotid bruits.  Abdomen: Soft, obese non tender, No hepatosplenomegaly. Bowel sounds positive.  Musculoskeletal: no clubbing / cyanosis. Moves all extremities Skin: no jaundice, No lesions.  Neurologic: Sensation intact  Strength appears equal, patient takes her time to follow simple commands  Psychiatric:   Alert and oriented x 3. Normal mood.     Labs on Admission: I have personally reviewed following labs and imaging studies  CBC:  Recent Labs Lab 01/25/17 1340 01/25/17 1422    WBC  --  6.6  NEUTROABS 3.2  --   HGB  --  11.4*  HCT  --  35.2*  MCV  --  87.1  PLT  --  539    Basic Metabolic Panel:  Recent Labs Lab 01/25/17 1341  NA 140  K 3.6  CL 105  CO2 21*  GLUCOSE 138*  BUN 34*  CREATININE 1.59*  CALCIUM 8.5*    GFR: Estimated Creatinine Clearance: 31.4 mL/min (A) (by C-G formula based on SCr of 1.59 mg/dL (H)).  Liver Function Tests:  Recent Labs Lab 01/25/17 1341  AST 22  ALT 14  ALKPHOS 73  BILITOT 0.5  PROT 6.2*  ALBUMIN 2.6*   No results for input(s): LIPASE, AMYLASE in the last 168 hours.  No results for input(s): AMMONIA in the last 168 hours.  Coagulation Profile:  Recent Labs Lab 01/25/17 1340  INR 2.43    Cardiac Enzymes: No results for input(s): CKTOTAL, CKMB, CKMBINDEX, TROPONINI in the last 168 hours.  BNP (last 3 results) No results for input(s): PROBNP in the last 8760 hours.  HbA1C: No results for input(s): HGBA1C in the last 72 hours.  CBG: No results for input(s): GLUCAP in the last 168 hours.  Lipid Profile: No results for input(s): CHOL, HDL, LDLCALC, TRIG, CHOLHDL, LDLDIRECT in the last 72 hours.  Thyroid Function Tests: No results for input(s): TSH, T4TOTAL, FREET4, T3FREE, THYROIDAB in the last 72 hours.  Anemia Panel: No results for input(s): VITAMINB12, FOLATE, FERRITIN, TIBC, IRON, RETICCTPCT in the last 72 hours.  Urine analysis:    Component Value Date/Time   COLORURINE YELLOW 10/11/2016 1538   APPEARANCEUR CLEAR 10/11/2016 1538   LABSPEC 1.011 10/11/2016 1538   PHURINE 5.0 10/11/2016 1538   GLUCOSEU NEGATIVE 10/11/2016 1538   GLUCOSEU NEGATIVE 03/17/2016 1727   HGBUR NEGATIVE 10/11/2016 1538   BILIRUBINUR NEGATIVE 10/11/2016 1538   KETONESUR NEGATIVE 10/11/2016 1538   PROTEINUR NEGATIVE 10/11/2016 1538   UROBILINOGEN 0.2 03/17/2016 1727   NITRITE NEGATIVE 10/11/2016 1538   LEUKOCYTESUR NEGATIVE 10/11/2016 1538    Sepsis  Labs: @LABRCNTIP (procalcitonin:4,lacticidven:4) )No results found for this or any previous visit (from the past 240 hour(s)).   Radiological Exams on Admission: Ct Head Code Stroke Wo Contrast  Result Date: 01/25/2017 CLINICAL DATA:  Code stroke. 83 y/o F; left facial droop with altered mental status. EXAM: CT HEAD WITHOUT CONTRAST TECHNIQUE: Contiguous axial images were obtained from the base of the skull through the vertex without intravenous contrast. COMPARISON:  02/02/2016 MRI of the head. FINDINGS: Brain: Small region of right parietal and bilateral inferomedial frontal encephalomalacia. Several foci of hypoattenuation are present within subcortical and periventricular white matter similar FLAIR signal abnormality on prior MRI, probably chronic microvascular ischemic changes. Small lucency within the left cerebellar hemisphere is new from prior MRI and may represent an interval small infarction. No evidence for large territory infarct, intracranial hemorrhage, or new focus of mass effect. Vascular: Calcific atherosclerosis of carotid siphons. No hyperdense vessel identified. Skull: Frontal craniotomy and right posterior ethmoid sinus postsurgical changes. High attenuation structure with calcifications centered within the cranial defect between right frontal cranial fossa and right posterior ethmoid air cells measuring 23 x 15 x 20 mm (AP x ML x CC series 5, image 19 and series 6, image 33) stable in comparison with the low signal structure in the same region on prior MRI of the brain. Sinuses/Orbits: Right sphenoid sinus partial opacification with chronic inflammatory changes of the wall of the sinus. Normal aeration of mastoid air cells. Other: None. ASPECTS [facility] Stroke Program Early CT Score) - Ganglionic level infarction (caudate, lentiform nuclei, internal capsule, insula, M1-M3 cortex): 7 - Supraganglionic infarction (M4-M6 cortex): 3 Total score (0-10 with 10 being normal): 10 IMPRESSION: 1.  No acute intracranial abnormality identified. 2. Areas of encephalomalacia in the inferior medial frontal lobes and right parietal lobe stable from prior MRI. 3. Stable chronic microvascular ischemic changes and parenchymal volume loss of the brain. 4. Cranial defect between right anterior cranial fossa and the right sphenoid sinus with a hyperdense masslike structure occupying the defect and extending into the right frontal lobe which may represent postsurgical changes or possibly a meningioma, stable from prior MRI given differences in technique. 5. ASPECTS is 10 These results were  called by telephone at the time of interpretation on 01/25/2017 at 2:08 pm to Dr. Lorraine Lax who verbally acknowledged these results. Electronically Signed   By: Kristine Garbe M.D.   On: 01/25/2017 14:09    EKG: Independently reviewed.  Assessment/Plan Active Problems:   Hyperlipidemia   GOUT   Essential hypertension   GERD   Osteoarthrosis involving lower leg   Impaired glucose tolerance   Dementia with behavioral disturbance   General weakness   Pulmonary embolus (HCC)   DVT of lower extremity, bilateral (HCC)   Diastolic CHF, chronic (HCC)   Acute CVA (cerebrovascular accident) ([place])   Chronic hypoxemic respiratory failure (HCC)   Seizures (HCC)    Stroke like symptoms, presumed Seizures: Presenting with L sided facial droop and AMS "unable to respond questions"  No evidence of infectious or other metabolic process causing seizures. UA pending . No headache or other complaints to suggest intracranial process,  CT head negative. MRI brain is pending. Neuro consult has low suspicion for CVA . She is afebrile  Tele obs Seizure order set    CIWA protocol  EEG  MRA of the head and neck  Seizure and fall precautions   Hypertension BP 112/84   Pulse   53    Continue home anti-hypertensive medications in am   Hyperlipidemia Continue home statins  Dementia Continue Aricept   History of PE and DVT  on Xarelto Continue Xarelto   Chronic kidney disease stage 3, Cr 1.59 at baseline   Lab Results  Component Value Date   CREATININE 1.59 (H) 01/25/2017   CREATININE 1.60 (H) 01/25/2017   CREATININE 1.78 (H) 12/15/2016  IVF Repeat CMET in am   History of DVT, on Xarelto. No acute issues  Continue Xarelto      DVT prophylaxis:  Xarelto  Code Status:    Full  Family Communication:  Discussed with patient Disposition Plan: Expect patient to be discharged to home after condition improves Consults called:    Neurology per EDP  Admission status:  Tele OBs    [place], PA-C [facility]   01/25/2017, 4:09 PM

## 2017-01-25 NOTE — ED Provider Notes (Signed)
[facility] EMERGENCY DEPARTMENT Provider Note   CSN: [id number] Arrival date & time: 01/25/17  1327   An emergency department physician performed an initial assessment on this suspected stroke patient at 1330.  History   Chief Complaint Chief Complaint  Patient presents with  . Code Stroke    HPI Heather [name] is a 83 y.o. female.  83 yo F with a chief complaint of transient confusion and left-sided facial droop. This has resolved spontaneously. The patient arrived as a code stroke. She does not remember the exact incident. Family stated she had some episode of nausea and vomiting with it as well. Denied head injury. Denied upper or lower extremity weakness. Denied fever or neck pain. Deny recent change medications. Denied cough congestion fever. Denied urinary symptoms.   The history is provided by the patient and a relative.  Illness  This is a new problem. The current episode started 6 to 12 hours ago. The problem occurs rarely. The problem has been resolved. Pertinent negatives include no chest pain, no headaches and no shortness of breath. Nothing aggravates the symptoms. Nothing relieves the symptoms. She has tried nothing for the symptoms. The treatment provided no relief.    Past Medical History:  Diagnosis Date  . ALLERGIC RHINITIS 12/09/2006  . DEGENERATIVE JOINT DISEASE, LEFT KNEE 01/23/2007  . Encephalocele ([place]) 01/23/2007  . FEVER UNSPECIFIED 01/28/2010  . GERD 12/09/2006  . GLUCOSE INTOLERANCE 01/24/2009  . GOUT 12/09/2006  . HYPERCHOLESTEROLEMIA 12/09/2006  . HYPERLIPIDEMIA 12/18/2006  . HYPERTENSION 12/09/2006  . Impaired glucose tolerance 07/26/2010  . MENOPAUSAL DISORDER 01/25/2008    Patient Active Problem List   Diagnosis Date Noted  . Adhesive capsulitis of left shoulder 01/02/2017  . Acute bilateral low back pain without sciatica 01/02/2017  . Gait disorder 12/17/2016  . Do not resuscitate discussion 12/17/2016  . Right shoulder pain  09/25/2016  . Insomnia 08/16/2016  . Dysuria 08/16/2016  . Chronic hypoxemic respiratory failure ([place]) 06/12/2016  . Nocturia 03/17/2016  . Mild dementia 03/09/2016  . Stroke syndrome 03/09/2016  . Acute CVA (cerebrovascular accident) ([place]) 02/05/2016  . Rash 02/05/2016  . DVT of lower extremity, bilateral ([place]) 01/28/2016  . Diastolic CHF, chronic ([place]) 01/28/2016  . Acute kidney injury ([place])   . Pulmonary embolus ([place])   . Acute respiratory failure with hypoxia ([place]) 01/24/2016  . Dementia with behavioral disturbance 01/01/2016  . Non compliance w medication regimen 01/01/2016  . General weakness 01/01/2016  . Peripheral edema 01/01/2016  . Chronic venous insufficiency 08/01/2014  . Fall at home 08/01/2013  . Osteopenia 08/01/2013  . Left knee pain 01/29/2011  . Encounter for well adult exam with abnormal findings 07/26/2010  . Impaired glucose tolerance 07/26/2010  . MENOPAUSAL DISORDER 01/25/2008  . Osteoarthrosis, unspecified whether generalized or localized, involving lower leg 01/23/2007  . Encephalocele ([place]) 01/23/2007  . Hyperlipidemia 12/18/2006  . GOUT 12/09/2006  . Essential hypertension 12/09/2006  . ALLERGIC RHINITIS 12/09/2006  . GERD 12/09/2006    Past Surgical History:  Procedure Laterality Date  . s/p anterior encephalocele repair and craniotomy  2003    OB History    No data available       Home Medications    Prior to Admission medications   Medication Sig Start Date End Date Taking? Authorizing Provider  acetaminophen (TYLENOL) 500 MG tablet Take 500 mg by mouth every 6 (six) hours as needed for moderate pain (knee, leg, stomach).   Yes [provider]  amLODipine (  NORVASC) 5 MG tablet Take 1 tablet (5 mg total) by mouth daily. 12/17/16  Yes Biagio Borg, MD  benazepril (LOTENSIN) 40 MG tablet Take 40 mg by mouth daily. 01/01/16  Yes [provider]  carvedilol (COREG) 25 MG tablet Take 1 tablet (25 mg total) by mouth 2 (two)  times daily with a meal. 01/01/16  Yes Biagio Borg, MD  donepezil (ARICEPT) 10 MG tablet Take 1 tablet daily 09/08/16  Yes Cameron Sprang, MD  furosemide (LASIX) 40 MG tablet Take 1 tablet (40 mg total) by mouth daily as needed. 12/17/16  Yes Biagio Borg, MD  Multiple Vitamin (MULTIVITAMIN WITH MINERALS) TABS tablet Take 1 tablet by mouth daily. [place]   Yes [provider]  nystatin (MYCOSTATIN/NYSTOP) powder Use as directed twice per day as needed Patient taking differently: Use as directed twice per day as needed for itching 10/07/16  Yes Biagio Borg, MD  OVER THE COUNTER MEDICATION Take 1 capsule by mouth daily. QUAIL OIL   Yes [provider]  QUEtiapine (SEROQUEL) 50 MG tablet 1 tab by mouth in the AM 09/25/16  Yes Biagio Borg, MD  rivaroxaban (XARELTO) 20 MG TABS tablet Take 1 tablet (20 mg total) by mouth daily with supper. 12/17/16  Yes Biagio Borg, MD  traMADol (ULTRAM) 50 MG tablet Take 1 tablet (50 mg total) by mouth every 8 (eight) hours as needed. 09/25/16  Yes Biagio Borg, MD  traZODone (DESYREL) 50 MG tablet Take 0.5-1 tablets (25-50 mg total) by mouth at bedtime as needed for sleep. 09/25/16  Yes Biagio Borg, MD    Family History Family History  Problem Relation Age of Onset  . Hypertension Other     Social History Social History  Substance Use Topics  . Smoking status: Never Smoker  . Smokeless tobacco: Never Used  . Alcohol use No     Allergies   Lipitor [atorvastatin]; Codeine; Lovastatin; and Pravastatin sodium   Review of Systems Review of Systems  Constitutional: Positive for activity change. Negative for chills and fever.  HENT: Negative for congestion and rhinorrhea.   Eyes: Negative for redness and visual disturbance.  Respiratory: Negative for shortness of breath and wheezing.   Cardiovascular: Negative for chest pain and palpitations.  Gastrointestinal: Negative for nausea and vomiting.  Genitourinary: Negative for dysuria  and urgency.  Musculoskeletal: Negative for arthralgias and myalgias.  Skin: Negative for pallor and wound.  Neurological: Positive for weakness. Negative for dizziness and headaches.     Physical Exam Updated Vital Signs BP 112/84   Pulse (!) 53   Temp 97.9 F (36.6 C) (Oral)   Resp 14   Ht 5\' 4"  (1.626 m)   Wt 113.4 kg (250 lb)   SpO2 94%   BMI 42.91 kg/m   Physical Exam  Constitutional: She is oriented to person, place, and time. She appears well-developed and well-nourished. No distress.  obese  HENT:  Head: Normocephalic and atraumatic.  Eyes: Pupils are equal, round, and reactive to light. EOM are normal.  Neck: Normal range of motion. Neck supple.  Cardiovascular: Normal rate and regular rhythm.  Exam reveals no gallop and no friction rub.   No murmur heard. Pulmonary/Chest: Effort normal. She has no wheezes. She has no rales.  Abdominal: Soft. She exhibits no distension and no mass. There is no tenderness. There is no guarding.  Musculoskeletal: She exhibits no edema or tenderness.  Neurological: She is alert and oriented to  person, place, and time. She has normal strength. No cranial nerve deficit or sensory deficit. Coordination normal. GCS eye subscore is 4. GCS verbal subscore is 5. GCS motor subscore is 6. She displays no Babinski's sign on the right side.  Skin: Skin is warm and dry. She is not diaphoretic.  Psychiatric: She has a normal mood and affect. Her behavior is normal.  Nursing note and vitals reviewed.    ED Treatments / Results  Labs (all labs ordered are listed, but only abnormal results are displayed) Labs Reviewed  PROTIME-INR - Abnormal; Notable for the following:       Result Value   Prothrombin Time 26.2 (*)    All other components within normal limits  APTT - Abnormal; Notable for the following:    aPTT 46 (*)    All other components within normal limits  COMPREHENSIVE METABOLIC PANEL - Abnormal; Notable for the following:    Glucose,  Bld 138 (*)    BUN 34 (*)    Creatinine, Ser 1.60 (*)    All other components within normal limits  CBC - Abnormal; Notable for the following:    Hemoglobin 11.4 (*)    HCT 35.2 (*)    RDW 17.6 (*)    All other components within normal limits  COMPREHENSIVE METABOLIC PANEL - Abnormal; Notable for the following:    CO2 21 (*)    Glucose, Bld 138 (*)    BUN 34 (*)    Creatinine, Ser 1.59 (*)    Calcium 8.5 (*)    Total Protein 6.2 (*)    Albumin 2.6 (*)    GFR calc non Af Amer 28 (*)    GFR calc Af Amer 33 (*)    All other components within normal limits  DIFFERENTIAL  ETHANOL  RAPID URINE DRUG SCREEN, HOSP PERFORMED  URINALYSIS, ROUTINE W REFLEX MICROSCOPIC  I-STAT TROPONIN, ED  I-STAT CHEM 8, ED  CBG MONITORING, ED    EKG  EKG Interpretation None       Radiology Ct Head Code Stroke Wo Contrast  Result Date: 01/25/2017 CLINICAL DATA:  Code stroke. 83 y/o F; left facial droop with altered mental status. EXAM: CT HEAD WITHOUT CONTRAST TECHNIQUE: Contiguous axial images were obtained from the base of the skull through the vertex without intravenous contrast. COMPARISON:  02/02/2016 MRI of the head. FINDINGS: Brain: Small region of right parietal and bilateral inferomedial frontal encephalomalacia. Several foci of hypoattenuation are present within subcortical and periventricular white matter similar FLAIR signal abnormality on prior MRI, probably chronic microvascular ischemic changes. Small lucency within the left cerebellar hemisphere is new from prior MRI and may represent an interval small infarction. No evidence for large territory infarct, intracranial hemorrhage, or new focus of mass effect. Vascular: Calcific atherosclerosis of carotid siphons. No hyperdense vessel identified. Skull: Frontal craniotomy and right posterior ethmoid sinus postsurgical changes. High attenuation structure with calcifications centered within the cranial defect between right frontal cranial fossa  and right posterior ethmoid air cells measuring 23 x 15 x 20 mm (AP x ML x CC series 5, image 19 and series 6, image 33) stable in comparison with the low signal structure in the same region on prior MRI of the brain. Sinuses/Orbits: Right sphenoid sinus partial opacification with chronic inflammatory changes of the wall of the sinus. Normal aeration of mastoid air cells. Other: None. ASPECTS [facility] Stroke Program Early CT Score) - Ganglionic level infarction (caudate, lentiform nuclei, internal capsule, insula, M1-M3 cortex): 7 -  Supraganglionic infarction (M4-M6 cortex): 3 Total score (0-10 with 10 being normal): 10 IMPRESSION: 1. No acute intracranial abnormality identified. 2. Areas of encephalomalacia in the inferior medial frontal lobes and right parietal lobe stable from prior MRI. 3. Stable chronic microvascular ischemic changes and parenchymal volume loss of the brain. 4. Cranial defect between right anterior cranial fossa and the right sphenoid sinus with a hyperdense masslike structure occupying the defect and extending into the right frontal lobe which may represent postsurgical changes or possibly a meningioma, stable from prior MRI given differences in technique. 5. ASPECTS is 10 These results were called by telephone at the time of interpretation on 01/25/2017 at 2:08 pm to Dr. Lorraine Lax who verbally acknowledged these results. Electronically Signed   By: Kristine Garbe M.D.   On: 01/25/2017 14:09    Procedures Procedures (including critical care time)  Medications Ordered in ED Medications  LORazepam (ATIVAN) injection 0.5 mg (not administered)  ondansetron (ZOFRAN) injection 4 mg (4 mg Intravenous Given 01/25/17 1434)     Initial Impression / Assessment and Plan / ED Course  I have reviewed the triage vital signs and the nursing notes.  Pertinent labs & imaging results that were available during my care of the patient were reviewed by me and considered in my medical decision  making (see chart for details).     83 yo F With a chief complaint of confusion and left-sided facial droop. Made a code stroke on arrival. Neurology evaluated the patient and feel this most likely seizure. Recommended admission for inpatient EEG MRI/MRA. We'll discuss with the hospitalist.  The patients results and plan were reviewed and discussed.   Any x-rays performed were independently reviewed by myself.   Differential diagnosis were considered with the presenting HPI.  Medications  LORazepam (ATIVAN) injection 0.5 mg (not administered)  ondansetron (ZOFRAN) injection 4 mg (4 mg Intravenous Given 01/25/17 1434)    Vitals:   01/25/17 1400 01/25/17 1401 01/25/17 1500  BP: 117/64 114/64 112/84  Pulse: (!) 51 (!) 56 (!) 53  Resp: 13 17 14   Temp:  97.9 F (36.6 C)   TempSrc:  Oral   SpO2: 93% 95% 94%  Weight:  113.4 kg (250 lb)   Height:  5\' 4"  (4.580 m)     Final diagnoses:  Facial droop  Transient alteration of awareness    Admission/ observation were discussed with the admitting physician, patient and/or family and they are comfortable with the plan.    Final Clinical Impressions(s) / ED Diagnoses   Final diagnoses:  Facial droop  Transient alteration of awareness    New Prescriptions New Prescriptions   No medications on file     Deno Etienne, DO 01/25/17 1600

## 2017-01-25 NOTE — ED Notes (Signed)
Patient transported to MRI 

## 2017-01-26 ENCOUNTER — Observation Stay (HOSPITAL_BASED_OUTPATIENT_CLINIC_OR_DEPARTMENT_OTHER)
Admit: 2017-01-26 | Discharge: 2017-01-26 | Disposition: A | Discharge status: Home / Self Care | Payer: Medicare Other | Attending: Internal Medicine | Admitting: Internal Medicine

## 2017-01-26 DIAGNOSIS — I5032 Chronic diastolic (congestive) heart failure: Secondary | ICD-10-CM | POA: Diagnosis not present

## 2017-01-26 DIAGNOSIS — G9340 Encephalopathy, unspecified: Secondary | ICD-10-CM

## 2017-01-26 DIAGNOSIS — G9341 Metabolic encephalopathy: Secondary | ICD-10-CM | POA: Diagnosis not present

## 2017-01-26 DIAGNOSIS — R531 Weakness: Secondary | ICD-10-CM | POA: Diagnosis not present

## 2017-01-26 DIAGNOSIS — J9611 Chronic respiratory failure with hypoxia: Secondary | ICD-10-CM | POA: Diagnosis not present

## 2017-01-26 DIAGNOSIS — I10 Essential (primary) hypertension: Secondary | ICD-10-CM | POA: Diagnosis not present

## 2017-01-26 DIAGNOSIS — F0391 Unspecified dementia with behavioral disturbance: Secondary | ICD-10-CM | POA: Diagnosis not present

## 2017-01-26 DIAGNOSIS — R4182 Altered mental status, unspecified: Secondary | ICD-10-CM | POA: Diagnosis not present

## 2017-01-26 DIAGNOSIS — R29810 Facial weakness: Secondary | ICD-10-CM | POA: Diagnosis not present

## 2017-01-26 NOTE — Care Management Obs Status (Signed)
[place] NOTIFICATION   Patient Details  Name: Heather [name] MRN: [id number] Date of Birth: 1929/03/31   Medicare Observation Status Notification Given:  Yes    Carles Collet, RN 01/26/2017, 2:45 PM

## 2017-01-26 NOTE — Progress Notes (Signed)
[facility]                                                                              Patient Demographics  Heather [name], is a 83 y.o. female, DOB - 1929-01-02, YQM:[id number]  Admit date - 01/25/2017   Admitting Physician Waldemar Dickens, MD  Outpatient Primary MD for the patient is Biagio Borg, MD  Outpatient specialists:   LOS - 0  days   Medical records reviewed and are as summarized below:    Chief Complaint  Patient presents with  . Code Stroke       Brief summary    The patient is a 83 year old female with hypertension, hyperlipidemia, dementia, history of bilateral lower extremity DVTs on xarelto, presented with left lower face drooping, not answering questions appropriately. Patient without any further episode and was not able to provide history due to dementia. No prior history of seizures. No headache, vision changes, nausea and vomiting. Denied any syncopal episode or fall. Patient was not considered a TPA candidate secondary to xarelto.    Assessment & Plan    Principal Problem:   Acute metabolic encephalopathy: unclear etiology, possibly presyncopal episode vs seizure - Apparently patient presented with left-sided facial droop and altered mental status unable to respond to questions. - due to concern for possible CVA or seizure, patient underwent MRI of the brain, MRA and MRI C-spine which showed no acute ischemic infarct, no emergent large vessel occlusion. MRA of the head and neck without obvious high-grade stenosis. - neurology was consulted and recommended EEG, no antiepileptic medication unless positive EEG - PT evaluation recommended 24-hour supervision, patient lives at home with daughter, and baseline wheelchair bound - Follow UA and culture, at the time of admission BP was somewhat low, placed on IV fluids. Obtain Orthostatics  Active Problems:   Hyperlipidemia - . Continue home statin    Essential hypertension - BP low on  triage, currently stable, continue amlodipine, benazepril, Coreg, hold Lasix    Dementia with behavioral disturbance - Continue Aricept, Seroquel   History of Pulmonary embolus ([place]), DVT of lower extremity, bilateral (HCC) - Continues xarelto     Diastolic CHF, chronic (HCC), Chronic hypoxemic respiratory failure (HCC) - currently stable, euvolemic - O2 sats 99% on room air   Code Status: full  DVT Prophylaxis:  xarelto Family Communication: Discussed in detail with the patient, all imaging results, lab results explained to the patient and son at bedside    Disposition Plan:  Time Spent in minutes  25 minutes  Procedures:  MRI brain, MRA  Consultants:   Neurology  Antimicrobials:      Medications  Scheduled Meds: . amLODipine  5 mg Oral Daily  . benazepril  40 mg Oral Daily  . carvedilol  25 mg Oral BID WC  . donepezil  10 mg Oral Daily  . QUEtiapine  50 mg Oral Daily  . rivaroxaban  20 mg Oral Q supper   Continuous Infusions: . sodium chloride 75 mL/hr (01/25/17 2240)   PRN Meds:.ondansetron **OR** ondansetron (ZOFRAN) IV, senna-docusate, traZODone   Antibiotics   Anti-infectives    None  Subjective:   Heather [name] was seen and examined today. Patient denies dizziness, chest pain, shortness of breath, abdominal pain, N/V/D/C, new weakness, numbess, tingling. No acute events overnight.    Objective:   Vitals:   01/26/17 0040 01/26/17 0240 01/26/17 0506 01/26/17 0843  BP: (!) 135/54 (!) 95/50 (!) 143/63 (!) 169/60  Pulse: (!) 58 (!) 52 (!) 55   Resp: 20 16 16    Temp: 98.1 F (36.7 C)  98 F (36.7 C)   TempSrc: Oral  Oral   SpO2: 98% 97% 99%   Weight:      Height:        Intake/Output Summary (Last 24 hours) at 01/26/17 1043 Last data filed at 01/26/17 0400  Gross per 24 hour  Intake            487.5 ml  Output                0 ml  Net            487.5 ml     Wt Readings from Last 3 Encounters:  01/25/17 113.4 kg (250 lb)    09/08/16 113.4 kg (250 lb)  06/12/16 108 kg (238 lb)     Exam  General: Alert and oriented x 3, NAD  Eyes:   HEENT:  Atraumatic, normocephalic  Cardiovascular: S1 S2 auscultated, no rubs, murmurs or gallops. Regular rate and rhythm.  Respiratory: Clear to auscultation bilaterally, no wheezing, rales or rhonchi  Gastrointestinal: Soft, nontender, nondistended, + bowel sounds  Ext: no pedal edema bilaterally  Neuro: AAOx3, Cr N's II- XII. Strength 5/5 upper and lower extremities bilaterally, speech clear  Musculoskeletal: No digital cyanosis, clubbing  Skin: No rashes  Psych: Normal affect and demeanor, alert and oriented x3    Data Reviewed:  I have personally reviewed following labs and imaging studies  Micro Results No results found for this or any previous visit (from the past 240 hour(s)).  Radiology Reports Mr Virgel Paling YC Contrast  Result Date: 01/25/2017 CLINICAL DATA:  TIA EXAM: MRI HEAD WITHOUT CONTRAST MRA HEAD WITHOUT CONTRAST MRA NECK WITHOUT AND WITH CONTRAST TECHNIQUE: Multiplanar, multiecho pulse sequences of the brain and surrounding structures were obtained without intravenous contrast. Angiographic images of the Circle of Willis were obtained using MRA technique without intravenous contrast. Angiographic images of the neck were obtained using MRA technique without and with intravenous contrast. Carotid stenosis measurements (when applicable) are obtained utilizing NASCET criteria, using the distal internal carotid diameter as the denominator. CONTRAST:  72mL MULTIHANCE GADOBENATE DIMEGLUMINE 529 MG/ML IV SOLN COMPARISON:  Head CT 01/25/2017 Brain MRI 02/02/2016 FINDINGS: The study is degraded by motion, despite efforts to reduce this artifact, including utilization of motion-resistant MR sequences. The findings of the study are interpreted in the context of reduced sensitivity/specificity. Additionally, the post-contrast images of the neck could not be  obtained. MRI HEAD FINDINGS Brain: The midline structures are normal. No focal diffusion restriction to indicate acute infarct. No intraparenchymal hemorrhage. Unchanged bilateral frontal and right parietal encephalomalacia. No mass lesion. Right basal ganglia chronic micro hemorrhagic foci. No hydrocephalus, age advanced atrophy or lobar predominant volume loss. No dural abnormality or extra-axial collection. Skull and upper cervical spine: Status post resection of anterior cranial fossa encephalocele. Severely limited evaluation of the skullbase at this location due to motion. Sinuses/Orbits: No fluid levels or advanced mucosal thickening. No mastoid effusion. Normal orbits. MRA HEAD FINDINGS Intracranial internal carotid arteries: Normal. Anterior cerebral arteries: Normal. Middle cerebral arteries:  Normal. Posterior communicating arteries: Present bilaterally. Posterior cerebral arteries: Bilateral fetal origins. Basilar artery: Normal. Vertebral arteries: Left dominant. Right vertebral artery terminates in PICA. Limited assessment of the vessels in the posterior fossa. MRA NECK FINDINGS Severely motion degraded time-of-flight MRA images of the neck show patency of the cervical portions of the common carotid, internal carotid and vertebral arteries. No obvious proximal ICA stenosis. IMPRESSION: 1. Severely motion degraded examination. 2. No acute ischemic infarct. 3. No emergent large vessel occlusion. Otherwise limited MRA of the head and neck without obvious high-grade stenosis. 4. Unchanged bifrontal and right parietal encephalomalacia. 5. Severely degraded assessment of the anterior skullbase due to motion, limiting evaluation for residual encephalocele. Electronically Signed   By: Ulyses Jarred M.D.   On: 01/25/2017 21:13   Mr Angiogram Neck W Or Wo Contrast  Result Date: 01/25/2017 CLINICAL DATA:  TIA EXAM: MRI HEAD WITHOUT CONTRAST MRA HEAD WITHOUT CONTRAST MRA NECK WITHOUT AND WITH CONTRAST  TECHNIQUE: Multiplanar, multiecho pulse sequences of the brain and surrounding structures were obtained without intravenous contrast. Angiographic images of the Circle of Willis were obtained using MRA technique without intravenous contrast. Angiographic images of the neck were obtained using MRA technique without and with intravenous contrast. Carotid stenosis measurements (when applicable) are obtained utilizing NASCET criteria, using the distal internal carotid diameter as the denominator. CONTRAST:  28mL MULTIHANCE GADOBENATE DIMEGLUMINE 529 MG/ML IV SOLN COMPARISON:  Head CT 01/25/2017 Brain MRI 02/02/2016 FINDINGS: The study is degraded by motion, despite efforts to reduce this artifact, including utilization of motion-resistant MR sequences. The findings of the study are interpreted in the context of reduced sensitivity/specificity. Additionally, the post-contrast images of the neck could not be obtained. MRI HEAD FINDINGS Brain: The midline structures are normal. No focal diffusion restriction to indicate acute infarct. No intraparenchymal hemorrhage. Unchanged bilateral frontal and right parietal encephalomalacia. No mass lesion. Right basal ganglia chronic micro hemorrhagic foci. No hydrocephalus, age advanced atrophy or lobar predominant volume loss. No dural abnormality or extra-axial collection. Skull and upper cervical spine: Status post resection of anterior cranial fossa encephalocele. Severely limited evaluation of the skullbase at this location due to motion. Sinuses/Orbits: No fluid levels or advanced mucosal thickening. No mastoid effusion. Normal orbits. MRA HEAD FINDINGS Intracranial internal carotid arteries: Normal. Anterior cerebral arteries: Normal. Middle cerebral arteries: Normal. Posterior communicating arteries: Present bilaterally. Posterior cerebral arteries: Bilateral fetal origins. Basilar artery: Normal. Vertebral arteries: Left dominant. Right vertebral artery terminates in PICA.  Limited assessment of the vessels in the posterior fossa. MRA NECK FINDINGS Severely motion degraded time-of-flight MRA images of the neck show patency of the cervical portions of the common carotid, internal carotid and vertebral arteries. No obvious proximal ICA stenosis. IMPRESSION: 1. Severely motion degraded examination. 2. No acute ischemic infarct. 3. No emergent large vessel occlusion. Otherwise limited MRA of the head and neck without obvious high-grade stenosis. 4. Unchanged bifrontal and right parietal encephalomalacia. 5. Severely degraded assessment of the anterior skullbase due to motion, limiting evaluation for residual encephalocele. Electronically Signed   By: Ulyses Jarred M.D.   On: 01/25/2017 21:13   Mr Brain Wo Contrast  Result Date: 01/25/2017 CLINICAL DATA:  TIA EXAM: MRI HEAD WITHOUT CONTRAST MRA HEAD WITHOUT CONTRAST MRA NECK WITHOUT AND WITH CONTRAST TECHNIQUE: Multiplanar, multiecho pulse sequences of the brain and surrounding structures were obtained without intravenous contrast. Angiographic images of the Circle of Willis were obtained using MRA technique without intravenous contrast. Angiographic images of the neck were obtained using MRA technique  without and with intravenous contrast. Carotid stenosis measurements (when applicable) are obtained utilizing NASCET criteria, using the distal internal carotid diameter as the denominator. CONTRAST:  62mL MULTIHANCE GADOBENATE DIMEGLUMINE 529 MG/ML IV SOLN COMPARISON:  Head CT 01/25/2017 Brain MRI 02/02/2016 FINDINGS: The study is degraded by motion, despite efforts to reduce this artifact, including utilization of motion-resistant MR sequences. The findings of the study are interpreted in the context of reduced sensitivity/specificity. Additionally, the post-contrast images of the neck could not be obtained. MRI HEAD FINDINGS Brain: The midline structures are normal. No focal diffusion restriction to indicate acute infarct. No  intraparenchymal hemorrhage. Unchanged bilateral frontal and right parietal encephalomalacia. No mass lesion. Right basal ganglia chronic micro hemorrhagic foci. No hydrocephalus, age advanced atrophy or lobar predominant volume loss. No dural abnormality or extra-axial collection. Skull and upper cervical spine: Status post resection of anterior cranial fossa encephalocele. Severely limited evaluation of the skullbase at this location due to motion. Sinuses/Orbits: No fluid levels or advanced mucosal thickening. No mastoid effusion. Normal orbits. MRA HEAD FINDINGS Intracranial internal carotid arteries: Normal. Anterior cerebral arteries: Normal. Middle cerebral arteries: Normal. Posterior communicating arteries: Present bilaterally. Posterior cerebral arteries: Bilateral fetal origins. Basilar artery: Normal. Vertebral arteries: Left dominant. Right vertebral artery terminates in PICA. Limited assessment of the vessels in the posterior fossa. MRA NECK FINDINGS Severely motion degraded time-of-flight MRA images of the neck show patency of the cervical portions of the common carotid, internal carotid and vertebral arteries. No obvious proximal ICA stenosis. IMPRESSION: 1. Severely motion degraded examination. 2. No acute ischemic infarct. 3. No emergent large vessel occlusion. Otherwise limited MRA of the head and neck without obvious high-grade stenosis. 4. Unchanged bifrontal and right parietal encephalomalacia. 5. Severely degraded assessment of the anterior skullbase due to motion, limiting evaluation for residual encephalocele. Electronically Signed   By: Ulyses Jarred M.D.   On: 01/25/2017 21:13   Ct Head Code Stroke Wo Contrast  Result Date: 01/25/2017 CLINICAL DATA:  Code stroke. 83 y/o F; left facial droop with altered mental status. EXAM: CT HEAD WITHOUT CONTRAST TECHNIQUE: Contiguous axial images were obtained from the base of the skull through the vertex without intravenous contrast. COMPARISON:   02/02/2016 MRI of the head. FINDINGS: Brain: Small region of right parietal and bilateral inferomedial frontal encephalomalacia. Several foci of hypoattenuation are present within subcortical and periventricular white matter similar FLAIR signal abnormality on prior MRI, probably chronic microvascular ischemic changes. Small lucency within the left cerebellar hemisphere is new from prior MRI and may represent an interval small infarction. No evidence for large territory infarct, intracranial hemorrhage, or new focus of mass effect. Vascular: Calcific atherosclerosis of carotid siphons. No hyperdense vessel identified. Skull: Frontal craniotomy and right posterior ethmoid sinus postsurgical changes. High attenuation structure with calcifications centered within the cranial defect between right frontal cranial fossa and right posterior ethmoid air cells measuring 23 x 15 x 20 mm (AP x ML x CC series 5, image 19 and series 6, image 33) stable in comparison with the low signal structure in the same region on prior MRI of the brain. Sinuses/Orbits: Right sphenoid sinus partial opacification with chronic inflammatory changes of the wall of the sinus. Normal aeration of mastoid air cells. Other: None. ASPECTS [facility] Stroke Program Early CT Score) - Ganglionic level infarction (caudate, lentiform nuclei, internal capsule, insula, M1-M3 cortex): 7 - Supraganglionic infarction (M4-M6 cortex): 3 Total score (0-10 with 10 being normal): 10 IMPRESSION: 1. No acute intracranial abnormality identified. 2. Areas of encephalomalacia  in the inferior medial frontal lobes and right parietal lobe stable from prior MRI. 3. Stable chronic microvascular ischemic changes and parenchymal volume loss of the brain. 4. Cranial defect between right anterior cranial fossa and the right sphenoid sinus with a hyperdense masslike structure occupying the defect and extending into the right frontal lobe which may represent postsurgical changes or  possibly a meningioma, stable from prior MRI given differences in technique. 5. ASPECTS is 10 These results were called by telephone at the time of interpretation on 01/25/2017 at 2:08 pm to Dr. Lorraine Lax who verbally acknowledged these results. Electronically Signed   By: Kristine Garbe M.D.   On: 01/25/2017 14:09    Lab Data:  CBC:  Recent Labs Lab 01/25/17 1340 01/25/17 1422  WBC  --  6.6  NEUTROABS 3.2  --   HGB 11.9* 11.4*  HCT 35.0* 35.2*  MCV  --  87.1  PLT  --  211   Basic Metabolic Panel:  Recent Labs Lab 01/25/17 1340 01/25/17 1341  NA 140 140  K 3.5 3.6  CL 103 105  CO2  --  21*  GLUCOSE 138* 138*  BUN 34* 34*  CREATININE 1.60* 1.59*  CALCIUM  --  8.5*   GFR: Estimated Creatinine Clearance: 31.4 mL/min (A) (by C-G formula based on SCr of 1.59 mg/dL (H)). Liver Function Tests:  Recent Labs Lab 01/25/17 1341  AST 22  ALT 14  ALKPHOS 73  BILITOT 0.5  PROT 6.2*  ALBUMIN 2.6*   No results for input(s): LIPASE, AMYLASE in the last 168 hours. No results for input(s): AMMONIA in the last 168 hours. Coagulation Profile:  Recent Labs Lab 01/25/17 1340  INR 2.43   Cardiac Enzymes: No results for input(s): CKTOTAL, CKMB, CKMBINDEX, TROPONINI in the last 168 hours. BNP (last 3 results) No results for input(s): PROBNP in the last 8760 hours. HbA1C: No results for input(s): HGBA1C in the last 72 hours. CBG: No results for input(s): GLUCAP in the last 168 hours. Lipid Profile: No results for input(s): CHOL, HDL, LDLCALC, TRIG, CHOLHDL, LDLDIRECT in the last 72 hours. Thyroid Function Tests: No results for input(s): TSH, T4TOTAL, FREET4, T3FREE, THYROIDAB in the last 72 hours. Anemia Panel: No results for input(s): VITAMINB12, FOLATE, FERRITIN, TIBC, IRON, RETICCTPCT in the last 72 hours. Urine analysis:    Component Value Date/Time   COLORURINE YELLOW 10/11/2016 1538   APPEARANCEUR CLEAR 10/11/2016 1538   LABSPEC 1.011 10/11/2016 1538    PHURINE 5.0 10/11/2016 1538   GLUCOSEU NEGATIVE 10/11/2016 1538   GLUCOSEU NEGATIVE 03/17/2016 1727   HGBUR NEGATIVE 10/11/2016 1538   BILIRUBINUR NEGATIVE 10/11/2016 1538   KETONESUR NEGATIVE 10/11/2016 1538   PROTEINUR NEGATIVE 10/11/2016 1538   UROBILINOGEN 0.2 03/17/2016 1727   NITRITE NEGATIVE 10/11/2016 1538   LEUKOCYTESUR NEGATIVE 10/11/2016 1538     Ripudeep Rai M.D. [facility] 01/26/2017, 10:43 AM  Pager: [phone] Between 7am to 7pm - call Pager - [phone]  After 7pm go to [email or web address] - password [email or web address]  Call night coverage person covering after 7pm

## 2017-01-26 NOTE — Progress Notes (Addendum)
Neurology Progress Note   S:// No acute overnight complaints.  O:// Current vital signs: BP (!) 169/60 (BP Location: Right Arm)   Pulse (!) 55   Temp 98 F (36.7 C) (Oral)   Resp 16   Ht 5\' 4"  (1.626 m)   Wt 113.4 kg (250 lb)   SpO2 99%   BMI 42.91 kg/m  Vital signs in last 24 hours: Temp:  [97.7 F (36.5 C)-98.1 F (36.7 C)] 98 F (36.7 C) (10/16 0506) Pulse Rate:  [51-62] 55 (10/16 0506) Resp:  [11-23] 16 (10/16 0506) BP: (95-169)/(50-84) 169/60 (10/16 0843) SpO2:  [88 %-99 %] 99 % (10/16 0506) Weight:  [113.4 kg (250 lb)] 113.4 kg (250 lb) (10/15 1401) GENERAL: Awake, alert in NAD HEENT: - Normocephalic and atraumatic, dry mm, no LN++, no Thyromegally LUNGS - Clear to auscultation bilaterally with no wheezes CV - S1S2 RRR, no m/r/g, equal pulses bilaterally. ABDOMEN - Soft, nontender, nondistended with normoactive BS Ext: warm, well perfused, intact peripheral pulses, 2+ edema NEURO: somewhat improved mental status from yesterday Mental Status: AA&Ox3 (yesterday could only tell me her name, today able to tell me Jobe Marker, October, 2017) Language: speech isclear.  Naming, repetition, fluency, and comprehension intact. Cranial Nerves: PERRL 1mm/brisk. EOMI, visual fields full, no facial asymmetry, facial sensation intact, hearing intact, tongue/uvula/soft palate midline, normal sternocleidomastoid and trapezius muscle strength. No evidence of tongue atrophy or fibrillations Motor: nearly symmetric antigravity both upper extremities with the caveat that the left shoulder has decreased range of motion. 2/5 in both lower extremities. According to family, her strength is at baseline in all 4 extremities at this time. Tone: is normal and bulk is normal Sensation- Intact to light touch bilaterally Coordination: FTN intact bilaterally, unable to perform heel-knee-shin bilaterally Gait- non-ambulatory baseline  MEDICATIONS  Current Facility-Administered  Medications:  .  0.9 %  sodium chloride infusion, 75 mL/hr, Intravenous, Continuous, Elease Hashimoto, Last Rate: 75 mL/hr at 01/25/17 2240, 75 mL/hr at 01/25/17 2240 .  amLODipine (NORVASC) tablet 5 mg, 5 mg, Oral, Daily, Rondel Jumbo, PA-C, 5 mg at 01/26/17 0845 .  benazepril (LOTENSIN) tablet 40 mg, 40 mg, Oral, Daily, Rondel Jumbo, PA-C, 40 mg at 01/26/17 0846 .  carvedilol (COREG) tablet 25 mg, 25 mg, Oral, BID WC, Wertman, Coralee Pesa, PA-C, 25 mg at 01/26/17 0845 .  donepezil (ARICEPT) tablet 10 mg, 10 mg, Oral, Daily, Rondel Jumbo, PA-C, 10 mg at 01/26/17 0845 .  furosemide (LASIX) tablet 40 mg, 40 mg, Oral, Daily PRN, Shawn Route, Sara E, PA-C .  ondansetron (ZOFRAN) tablet 4 mg, 4 mg, Oral, Q6H PRN **OR** ondansetron (ZOFRAN) injection 4 mg, 4 mg, Intravenous, Q6H PRN, Shawn Route, Sara E, PA-C .  QUEtiapine (SEROQUEL) tablet 50 mg, 50 mg, Oral, Daily, Rondel Jumbo, PA-C, 50 mg at 01/26/17 0845 .  rivaroxaban (XARELTO) tablet 20 mg, 20 mg, Oral, Q supper, Rondel Jumbo, PA-C, 20 mg at 01/25/17 1827 .  senna-docusate (Senokot-S) tablet 1 tablet, 1 tablet, Oral, QHS PRN, Shawn Route, Sara E, PA-C .  traZODone (DESYREL) tablet 25-50 mg, 25-50 mg, Oral, QHS PRN, Elease Hashimoto Labs CBC    Component Value Date/Time   WBC 6.6 01/25/2017 1422   RBC 4.04 01/25/2017 1422   HGB 11.4 (L) 01/25/2017 1422   HCT 35.2 (L) 01/25/2017 1422   PLT 256 01/25/2017 1422   MCV 87.1 01/25/2017 1422   MCH 28.2 01/25/2017 1422   MCHC 32.4 01/25/2017 1422  RDW 17.6 (H) 01/25/2017 1422   LYMPHSABS 2.1 01/25/2017 1340   MONOABS 0.9 01/25/2017 1340   EOSABS 0.4 01/25/2017 1340   BASOSABS 0.1 01/25/2017 1340    CMP     Component Value Date/Time   NA 140 01/25/2017 1341   K 3.6 01/25/2017 1341   CL 105 01/25/2017 1341   CO2 21 (L) 01/25/2017 1341   GLUCOSE 138 (H) 01/25/2017 1341   BUN 34 (H) 01/25/2017 1341   CREATININE 1.59 (H) 01/25/2017 1341   CALCIUM 8.5 (L) 01/25/2017 1341   PROT  6.2 (L) 01/25/2017 1341   ALBUMIN 2.6 (L) 01/25/2017 1341   AST 22 01/25/2017 1341   ALT 14 01/25/2017 1341   ALKPHOS 73 01/25/2017 1341   BILITOT 0.5 01/25/2017 1341   GFRNONAA 28 (L) 01/25/2017 1341   GFRAA 33 (L) 01/25/2017 1341       Component Value Date/Time   CHOL 212 (H) 12/15/2016 1129   TRIG 189.0 (H) 12/15/2016 1129   TRIG 81 04/14/2006 0857   HDL 31.80 (L) 12/15/2016 1129   CHOLHDL 7 12/15/2016 1129   VLDL 37.8 12/15/2016 1129   LDLCALC 142 (H) 12/15/2016 1129   LDLDIRECT 139.0 01/01/2016 0958   Imaging I have reviewed images in epic and the results pertinent to this consultation are: CT-scan of the brain - no acute changes. MRI examination of the brain - motion riddled, no acute stroke, right parietal and bifrontal areas of encephalomalaicia - stable from prior exams. No LVO. Encephalocele unchanged. MRA brain and neck - no LVO. Normal posterior circulation  Assessment:  88/F with PMH of HTN, HLD, Dementia, B/L DVT on Xarelto, prior strokes with no residual deficits, brought in for eval by family members due to LFD and AMS. Symptoms had resolved by the time she was seen in ER. Her SBPs on EMS arrival were very low. MRI brain shows no evidence of acute stroke. MRA shows no clinically significant stenosis. No clear seizure like activity Likely syncopal episode in the setting of hypotension. Can not rule out seizures but will not start AED unless EEG is markedly abnormal or shows seizures.  Recommendations: C/w Xarelto for the DVT per primary team EEG pending - I will addend this note once results become available. No further stroke work up necessary at this time. Correction of toxic metabolic derangements if any per primary team Will recommend reducing medications with sedating side effects as much as possible. If concern for hypotension exists without any explainable cause, will recommend cardiac w/u to include Echo. Will defer to primary team.  Heather Portland,  MD [facility] [phone]  If 7pm to 7am, please call on call as listed on AMION.  Addendum after EEG 1434 hrs EEG reported normal. No AEDs for now. Consider AED if there is another episode concerning for seizure activity and no other explanation for AMS (this time was extremely hypotensive). No further neuro recommendations. Recommendations above stand. Please call with questions. Neurology will be available as needed.  Heather Portland, MD [facility] [phone]  If 7pm to 7am, please call on call as listed on AMION.

## 2017-01-26 NOTE — Progress Notes (Signed)
EEG completed, results pending. 

## 2017-01-26 NOTE — Progress Notes (Signed)
Physical Therapy Evaluation Patient Details Name: Heather [name] MRN: [id number] DOB: [birth year] Today's Date: 01/26/2017   History of Present Illness  83 y.o.femalewith PMH of HTN, HLD, dementia, bilateral LE DVTs on Xarelto. Presented to the ED with AMS after the patient was not answering questions appropriately and noted left lower face drooping. Imaging negative for acute stroke.  Clinical Impression  Patient presents with the above. Evaluated patient and noted findings below. Per patient's grandson, she is dependent with functional mobility and needs assistance with ADLs. Patient is currently functioning at her baseline level and no further PT services are needed at this time. PT will sign off.    Follow Up Recommendations Supervision/Assistance - 24 hour    Equipment Recommendations  None recommended by PT    Recommendations for Other Services       Precautions / Restrictions Precautions Precautions: Fall Restrictions Weight Bearing Restrictions: No      Mobility  Bed Mobility Overal bed mobility: Needs Assistance Bed Mobility: Supine to Sit;Sit to Supine     Supine to sit: Max assist;HOB elevated Sit to supine: Max assist   General bed mobility comments: pt needed max assist to elevate trunk as well as to transition hips with bed pad to EOB. For return to supine needed max A to elevated LEs to bed and scoot up in bed.  Transfers                    Ambulation/Gait                Stairs            Wheelchair Mobility    Modified Rankin (Stroke Patients Only)       Balance Overall balance assessment: Needs assistance Sitting-balance support: Bilateral upper extremity supported;Feet supported Sitting balance-Leahy Scale: Poor                                       Pertinent Vitals/Pain Pain Assessment: Faces Faces Pain Scale: Hurts little more Pain Location: R knee Pain Descriptors / Indicators:  Discomfort;Sore Pain Intervention(s): Monitored during session    Home Living Family/patient expects to be discharged to:: Private residence Living Arrangements: Children Available Help at Discharge: Family;Personal care attendant;Available 24 hours/day Type of Home: House Home Access: Level entry     Home Layout: One level Home Equipment: Wheelchair - manual;Bedside commode;Hospital bed (hoyer lift)      Prior Function Level of Independence: Needs assistance   Gait / Transfers Assistance Needed: does not ambulate; WC dependent  ADL's / Homemaking Assistance Needed: dependent; needs assist with ADLs.   Comments: Pt lives with daughter and has nursing aides/other daughters assist throughout the day. Needs help with ADLs and is in bed at all times.     Hand Dominance        Extremity/Trunk Assessment   Upper Extremity Assessment Upper Extremity Assessment: Defer to OT evaluation    Lower Extremity Assessment Lower Extremity Assessment: RLE deficits/detail;LLE deficits/detail RLE Deficits / Details: hip flex 0/5; DF/PF 4-/5  RLE Sensation: decreased light touch LLE Deficits / Details: hip flex 1/5; DF/PF 4-/5       Communication   Communication: No difficulties  Cognition Arousal/Alertness: Awake/alert Behavior During Therapy: WFL for tasks assessed/performed Overall Cognitive Status: History of cognitive impairments - at baseline  General Comments: Pt has h/o of dementia at baseline. A&O x 4 today.      General Comments General comments (skin integrity, edema, etc.): Grandson present throughout session reports that patient is dependent and uses hoyer lift for transferring at home. She rarely moves from the bed. She has several daughters and aides that are with her throughout the day to care and provide necessary levels of assistance.    Exercises     Assessment/Plan    PT Assessment Patent does not need any further  PT services  PT Problem List         PT Treatment Interventions      PT Goals (Current goals can be found in the Care Plan section)  Acute Rehab PT Goals Patient Stated Goal: to go home PT Goal Formulation: With patient/family Time For Goal Achievement: 02/09/17 Potential to Achieve Goals: Fair    Frequency     Barriers to discharge        Co-evaluation               AM-PAC PT "6 Clicks" Daily Activity  Outcome Measure Difficulty turning over in bed (including adjusting bedclothes, sheets and blankets)?: Unable Difficulty moving from lying on back to sitting on the side of the bed? : Unable Difficulty sitting down on and standing up from a chair with arms (e.g., wheelchair, bedside commode, etc,.)?: Unable Help needed moving to and from a bed to chair (including a wheelchair)?: Total Help needed walking in hospital room?: Total Help needed climbing 3-5 steps with a railing? : Total 6 Click Score: 6    End of Session   Activity Tolerance: Patient limited by fatigue Patient left: in bed;with bed alarm set;with call bell/phone within reach;with family/visitor present Nurse Communication: Mobility status      Time: 4765-4650 PT Time Calculation (min) (ACUTE ONLY): 19 min   Charges:   PT Evaluation $PT Eval Moderate Complexity: 1 Mod     PT G Codes:   PT G-Codes **NOT FOR INPATIENT CLASS** Functional Assessment Tool Used: Clinical judgement Functional Limitation: Mobility: Walking and moving around Mobility: Walking and Moving Around Current Status ([id number]): At least 60 percent but less than 80 percent impaired, limited or restricted Mobility: Walking and Moving Around Goal Status [phone]): At least 60 percent but less than 80 percent impaired, limited or restricted Mobility: Walking and Moving Around Discharge Status [phone]): At least 60 percent but less than 80 percent impaired, limited or restricted    A. Quintella Reichert, SPT [phone] office   Margarita Grizzle 01/26/2017, 9:58 AM

## 2017-01-26 NOTE — Procedures (Signed)
ELECTROENCEPHALOGRAM REPORT  Date of Study: 01/26/2017  Patient's Name: Heather [name] MRN: [id number] Date of Birth: April 20, 1928  Referring Provider: Dr. Estill Cotta  Clinical History: This is an 83 year old man with altered mental status.  Medications: amLODipine (NORVASC) tablet 5 mg  benazepril (LOTENSIN) tablet 40 mg  carvedilol (COREG) tablet 25 mg  donepezil (ARICEPT) tablet 10 mg  QUEtiapine (SEROQUEL) tablet 50 mg  rivaroxaban (XARELTO) tablet 20 mg  senna-docusate (Senokot-S) tablet 1 tablet  traZODone (DESYREL) tablet 25-50 mg   Technical Summary: A multichannel digital EEG recording measured by the international 10-20 system with electrodes applied with paste and impedances below 5000 ohms performed in our laboratory with EKG monitoring in an awake and drowsy patient.  Hyperventilation and photic stimulation were not performed.  The digital EEG was referentially recorded, reformatted, and digitally filtered in a variety of bipolar and referential montages for optimal display.    Description: The patient is awake and drowsy during the recording.  During maximal wakefulness, there is a symmetric, medium voltage 8 Hz posterior dominant rhythm that attenuates with eye opening.  The record is symmetric.  During drowsiness and sleep, there is an increase in theta and delta slowing of the background. Deeper stages of sleep were not seen.  Hyperventilation and photic stimulation were not performed.  There were no epileptiform discharges or electrographic seizures seen.    EKG lead showed sinus bradycardia at 48 bpm.  Impression: This awake and drowsy EEG is normal.    Clinical Correlation: A normal EEG does not exclude a clinical diagnosis of epilepsy. Clinical correlation is advised.   Ellouise Newer, M.D.

## 2017-01-27 DIAGNOSIS — E785 Hyperlipidemia, unspecified: Secondary | ICD-10-CM | POA: Diagnosis not present

## 2017-01-27 DIAGNOSIS — F0391 Unspecified dementia with behavioral disturbance: Secondary | ICD-10-CM | POA: Diagnosis not present

## 2017-01-27 DIAGNOSIS — R4182 Altered mental status, unspecified: Secondary | ICD-10-CM | POA: Diagnosis not present

## 2017-01-27 DIAGNOSIS — R404 Transient alteration of awareness: Secondary | ICD-10-CM | POA: Diagnosis not present

## 2017-01-27 DIAGNOSIS — G9341 Metabolic encephalopathy: Secondary | ICD-10-CM

## 2017-01-27 DIAGNOSIS — I10 Essential (primary) hypertension: Secondary | ICD-10-CM | POA: Diagnosis not present

## 2017-01-27 DIAGNOSIS — I82403 Acute embolism and thrombosis of unspecified deep veins of lower extremity, bilateral: Secondary | ICD-10-CM

## 2017-01-27 DIAGNOSIS — J9611 Chronic respiratory failure with hypoxia: Secondary | ICD-10-CM | POA: Diagnosis not present

## 2017-01-27 DIAGNOSIS — I2699 Other pulmonary embolism without acute cor pulmonale: Secondary | ICD-10-CM | POA: Diagnosis not present

## 2017-01-27 DIAGNOSIS — S91309A Unspecified open wound, unspecified foot, initial encounter: Secondary | ICD-10-CM | POA: Diagnosis not present

## 2017-01-27 DIAGNOSIS — J9601 Acute respiratory failure with hypoxia: Secondary | ICD-10-CM | POA: Diagnosis not present

## 2017-01-27 DIAGNOSIS — I5032 Chronic diastolic (congestive) heart failure: Secondary | ICD-10-CM

## 2017-01-27 DIAGNOSIS — R29810 Facial weakness: Secondary | ICD-10-CM | POA: Diagnosis not present

## 2017-01-27 DIAGNOSIS — G464 Cerebellar stroke syndrome: Secondary | ICD-10-CM | POA: Diagnosis not present

## 2017-01-27 LAB — BASIC METABOLIC PANEL
Anion gap: 8 (ref 5–15)
BUN: 23 mg/dL — ABNORMAL HIGH (ref 6–20)
CO2: 26 mmol/L (ref 22–32)
Calcium: 8.1 mg/dL — ABNORMAL LOW (ref 8.9–10.3)
Chloride: 104 mmol/L (ref 101–111)
Creatinine, Ser: 1.21 mg/dL — ABNORMAL HIGH (ref 0.44–1.00)
GFR calc Af Amer: 46 mL/min — ABNORMAL LOW (ref >60)
GFR calc non Af Amer: 39 mL/min — ABNORMAL LOW (ref >60)
Glucose, Bld: 95 mg/dL (ref 65–99)
Potassium: 3.5 mmol/L (ref 3.5–5.1)
Sodium: 138 mmol/L (ref 135–145)

## 2017-01-27 MED ORDER — ACETAMINOPHEN 325 MG PO TABS
650.0000 mg | ORAL_TABLET | Freq: Four times a day (QID) | ORAL | Status: DC | PRN
  Administered 2017-01-27: 650 mg via ORAL
  Filled 2017-01-27: qty 2

## 2017-01-27 NOTE — Discharge Summary (Signed)
Physician Discharge Summary  Heather [name] HWE:[id number] DOB: February 04, 1929 DOA: 01/25/2017  PCP: Biagio Borg, MD  Admit date: 01/25/2017 Discharge date: 01/27/2017  Time spent: 45 minutes  Recommendations for Outpatient Follow-up:  Patient will be discharged to home.  Patient will need to follow up with primary care provider within one week of discharge.  Follow up with neurology, Dr. Delice Lesch, in 1-2 weeks. Patient should continue medications as prescribed.  Patient should follow a heart healthy diet.   Discharge Diagnoses:  Acute metabolic encephalopathy Hyperlipidemia Essential hypertension Dementia History of pulmonary embolism/bilateral lower extremity DVT Chronic diastolic heart failure/chronic hypoxemic respiratory failure  Discharge Condition: Stable  Diet recommendation: heart healthy  Filed Weights   01/25/17 1401  Weight: 113.4 kg (250 lb)    History of present illness:  On 01/25/2017 by Ms. Sharene Butters, PA Heather [name] is a 83 y.o. female with medical history significant for HTN, HLD, history of dementia, history of bilateral lower extremity DVTs on Xarelto, bruits to the ED after the patient was not answering questions appropriately,with noted left lower face drooping. The patient does not remember that episode. However, she cannot provide history due to dementia. History is obtained by her grandson. No prior history of seizures. No apparent  headaches or vision changes, nausea or vomiting. No trouble swallowing.The family denies any preceding fevers or chills. No recent long distance trips. No recent sick contacts. No apparent chest pain or palpitations. No syncope, presyncope or fall.No apparent respiratory complaints. According to her daughter, the patient is able to urinate on her own without difficulty. No constipation, no worsening lower extremity swelling. The patient is compliant to her medications. Allergies the patient at the ER, and TPA was not given, as the  exam was not consistent with stroke.  Hospital Course:  Acute metabolic encephalopathy -unclear etiology, possibly presyncopal episode vs seizure -apparently presented with left-sided facial droop and altered mental status, unable to respond to questions -Currently appears to be at baseline -CT head showed no acute intracranial normality -MRI brain/MRA head/neck: No acute ischemic infarct, no emergent large vessel occlusion, unchanged bifrontal and right parietal encephalomalacia -Infection a possibility however patient denies any pain or burning with urination, denies cough or congestion. Patient also afebrile with no leukocytosis.  -EEG: Awake and drowsy EEG is normal, no elective form discharges or electrographic seizures seen  -Neurology consulted and appreciated, recommended no antilipid medication unless positive EEG or further seizure activity -Patient follows with Dr. Delice Lesch, recommended outpatient follow-up -PT consulted and recommended 24-hour supervision, no PT or further therapy needs. Patient is wheel chair bound at baseline -orthostatic vitals unremarkable (lying/sitting)  Hyperlipidemia -Continue statin  Essential hypertension -Continue amlodipine, benazepril, Coreg -Lasix was held during admission, may restart upon discharge  Dementia -Continue Aricept, Seroquel  History of pulmonary embolism/bilateral lower extremity DVT -Continue Xarelto  Chronic diastolic heart failure/chronic hypoxemic respiratory failure -Currently on room air and saturating in the upper 90s -Echocardiogram 01/26/2016 showed an EF of 89-38%, diastolic dysfunction, grade intermediate. -Appears to be euvolemic and stable -Continue home medications  Procedures: None  Consultations: Neurology  Discharge Exam: Vitals:   01/27/17 0813 01/27/17 1038  BP: (!) 119/49 (!) 128/56  Pulse: (!) 59 (!) 57  Resp: 16 18  Temp: 98.7 F (37.1 C) 98.4 F (36.9 C)  SpO2: 97% 98%   Patient feeling  better today. Denies any chest pain, short of breath, abdominal pain, nausea vomiting, diarrhea or constipation, dizziness or headache. Her daughter at bedside, patient seems  to be at baseline.   General: Well developed, well nourished, NAD, appears stated age  HEENT: NCAT, mucous membranes moist.  Cardiovascular: S1 S2 auscultated, no rubs, murmurs or gallops. Regular rate and rhythm.  Respiratory: Clear to auscultation bilaterally with equal chest rise  Abdomen: Soft, obese, nontender, nondistended, + bowel sounds  Extremities: warm dry without cyanosis clubbing or edema  Neuro: AAOx3, nonfocal  Psych: Appropriate and pleasant  Discharge Instructions Discharge Instructions    Discharge instructions    Complete by:  As directed    Patient will be discharged to home.  Patient will need to follow up with primary care provider within one week of discharge.  Follow up with neurology, Dr. Delice Lesch, in 1-2 weeks. Patient should continue medications as prescribed.  Patient should follow a heart healthy diet.     Current Discharge Medication List    CONTINUE these medications which have NOT CHANGED   Details  acetaminophen (TYLENOL) 500 MG tablet Take 500 mg by mouth every 6 (six) hours as needed for moderate pain (knee, leg, stomach).    amLODipine (NORVASC) 5 MG tablet Take 1 tablet (5 mg total) by mouth daily. Qty: 90 tablet, Refills: 3    benazepril (LOTENSIN) 40 MG tablet Take 40 mg by mouth daily. Refills: 0    carvedilol (COREG) 25 MG tablet Take 1 tablet (25 mg total) by mouth 2 (two) times daily with a meal. Qty: 180 tablet, Refills: 3    donepezil (ARICEPT) 10 MG tablet Take 1 tablet daily Qty: 90 tablet, Refills: 3   Associated Diagnoses: Mild dementia    furosemide (LASIX) 40 MG tablet Take 1 tablet (40 mg total) by mouth daily as needed. Qty: 90 tablet, Refills: 3    Multiple Vitamin (MULTIVITAMIN WITH MINERALS) TABS tablet Take 1 tablet by mouth daily. 50 ALIVE     nystatin (MYCOSTATIN/NYSTOP) powder Use as directed twice per day as needed Qty: 56.7 g, Refills: 5    OVER THE COUNTER MEDICATION Take 1 capsule by mouth daily. QUAIL OIL    QUEtiapine (SEROQUEL) 50 MG tablet 1 tab by mouth in the AM Qty: 90 tablet, Refills: 3    rivaroxaban (XARELTO) 20 MG TABS tablet Take 1 tablet (20 mg total) by mouth daily with supper. Qty: 30 tablet, Refills: 11    traMADol (ULTRAM) 50 MG tablet Take 1 tablet (50 mg total) by mouth every 8 (eight) hours as needed. Qty: 90 tablet, Refills: 2    traZODone (DESYREL) 50 MG tablet Take 0.5-1 tablets (25-50 mg total) by mouth at bedtime as needed for sleep. Qty: 30 tablet, Refills: 5       Allergies  Allergen Reactions  . Lipitor [Atorvastatin] Other (See Comments)    myalgia  . Codeine Other (See Comments)  . Lovastatin Other (See Comments)  . Pravastatin Sodium Other (See Comments)      The results of significant diagnostics from this hospitalization (including imaging, microbiology, ancillary and laboratory) are listed below for reference.    Significant Diagnostic Studies: Mr Virgel Paling ZO Contrast  Result Date: 01/25/2017 CLINICAL DATA:  TIA EXAM: MRI HEAD WITHOUT CONTRAST MRA HEAD WITHOUT CONTRAST MRA NECK WITHOUT AND WITH CONTRAST TECHNIQUE: Multiplanar, multiecho pulse sequences of the brain and surrounding structures were obtained without intravenous contrast. Angiographic images of the Circle of Willis were obtained using MRA technique without intravenous contrast. Angiographic images of the neck were obtained using MRA technique without and with intravenous contrast. Carotid stenosis measurements (when applicable) are obtained utilizing  NASCET criteria, using the distal internal carotid diameter as the denominator. CONTRAST:  57mL MULTIHANCE GADOBENATE DIMEGLUMINE 529 MG/ML IV SOLN COMPARISON:  Head CT 01/25/2017 Brain MRI 02/02/2016 FINDINGS: The study is degraded by motion, despite efforts to reduce  this artifact, including utilization of motion-resistant MR sequences. The findings of the study are interpreted in the context of reduced sensitivity/specificity. Additionally, the post-contrast images of the neck could not be obtained. MRI HEAD FINDINGS Brain: The midline structures are normal. No focal diffusion restriction to indicate acute infarct. No intraparenchymal hemorrhage. Unchanged bilateral frontal and right parietal encephalomalacia. No mass lesion. Right basal ganglia chronic micro hemorrhagic foci. No hydrocephalus, age advanced atrophy or lobar predominant volume loss. No dural abnormality or extra-axial collection. Skull and upper cervical spine: Status post resection of anterior cranial fossa encephalocele. Severely limited evaluation of the skullbase at this location due to motion. Sinuses/Orbits: No fluid levels or advanced mucosal thickening. No mastoid effusion. Normal orbits. MRA HEAD FINDINGS Intracranial internal carotid arteries: Normal. Anterior cerebral arteries: Normal. Middle cerebral arteries: Normal. Posterior communicating arteries: Present bilaterally. Posterior cerebral arteries: Bilateral fetal origins. Basilar artery: Normal. Vertebral arteries: Left dominant. Right vertebral artery terminates in PICA. Limited assessment of the vessels in the posterior fossa. MRA NECK FINDINGS Severely motion degraded time-of-flight MRA images of the neck show patency of the cervical portions of the common carotid, internal carotid and vertebral arteries. No obvious proximal ICA stenosis. IMPRESSION: 1. Severely motion degraded examination. 2. No acute ischemic infarct. 3. No emergent large vessel occlusion. Otherwise limited MRA of the head and neck without obvious high-grade stenosis. 4. Unchanged bifrontal and right parietal encephalomalacia. 5. Severely degraded assessment of the anterior skullbase due to motion, limiting evaluation for residual encephalocele. Electronically Signed   By:  Ulyses Jarred M.D.   On: 01/25/2017 21:13   Mr Angiogram Neck W Or Wo Contrast  Result Date: 01/25/2017 CLINICAL DATA:  TIA EXAM: MRI HEAD WITHOUT CONTRAST MRA HEAD WITHOUT CONTRAST MRA NECK WITHOUT AND WITH CONTRAST TECHNIQUE: Multiplanar, multiecho pulse sequences of the brain and surrounding structures were obtained without intravenous contrast. Angiographic images of the Circle of Willis were obtained using MRA technique without intravenous contrast. Angiographic images of the neck were obtained using MRA technique without and with intravenous contrast. Carotid stenosis measurements (when applicable) are obtained utilizing NASCET criteria, using the distal internal carotid diameter as the denominator. CONTRAST:  12mL MULTIHANCE GADOBENATE DIMEGLUMINE 529 MG/ML IV SOLN COMPARISON:  Head CT 01/25/2017 Brain MRI 02/02/2016 FINDINGS: The study is degraded by motion, despite efforts to reduce this artifact, including utilization of motion-resistant MR sequences. The findings of the study are interpreted in the context of reduced sensitivity/specificity. Additionally, the post-contrast images of the neck could not be obtained. MRI HEAD FINDINGS Brain: The midline structures are normal. No focal diffusion restriction to indicate acute infarct. No intraparenchymal hemorrhage. Unchanged bilateral frontal and right parietal encephalomalacia. No mass lesion. Right basal ganglia chronic micro hemorrhagic foci. No hydrocephalus, age advanced atrophy or lobar predominant volume loss. No dural abnormality or extra-axial collection. Skull and upper cervical spine: Status post resection of anterior cranial fossa encephalocele. Severely limited evaluation of the skullbase at this location due to motion. Sinuses/Orbits: No fluid levels or advanced mucosal thickening. No mastoid effusion. Normal orbits. MRA HEAD FINDINGS Intracranial internal carotid arteries: Normal. Anterior cerebral arteries: Normal. Middle cerebral  arteries: Normal. Posterior communicating arteries: Present bilaterally. Posterior cerebral arteries: Bilateral fetal origins. Basilar artery: Normal. Vertebral arteries: Left dominant. Right vertebral artery terminates  in PICA. Limited assessment of the vessels in the posterior fossa. MRA NECK FINDINGS Severely motion degraded time-of-flight MRA images of the neck show patency of the cervical portions of the common carotid, internal carotid and vertebral arteries. No obvious proximal ICA stenosis. IMPRESSION: 1. Severely motion degraded examination. 2. No acute ischemic infarct. 3. No emergent large vessel occlusion. Otherwise limited MRA of the head and neck without obvious high-grade stenosis. 4. Unchanged bifrontal and right parietal encephalomalacia. 5. Severely degraded assessment of the anterior skullbase due to motion, limiting evaluation for residual encephalocele. Electronically Signed   By: Ulyses Jarred M.D.   On: 01/25/2017 21:13   Mr Brain Wo Contrast  Result Date: 01/25/2017 CLINICAL DATA:  TIA EXAM: MRI HEAD WITHOUT CONTRAST MRA HEAD WITHOUT CONTRAST MRA NECK WITHOUT AND WITH CONTRAST TECHNIQUE: Multiplanar, multiecho pulse sequences of the brain and surrounding structures were obtained without intravenous contrast. Angiographic images of the Circle of Willis were obtained using MRA technique without intravenous contrast. Angiographic images of the neck were obtained using MRA technique without and with intravenous contrast. Carotid stenosis measurements (when applicable) are obtained utilizing NASCET criteria, using the distal internal carotid diameter as the denominator. CONTRAST:  56mL MULTIHANCE GADOBENATE DIMEGLUMINE 529 MG/ML IV SOLN COMPARISON:  Head CT 01/25/2017 Brain MRI 02/02/2016 FINDINGS: The study is degraded by motion, despite efforts to reduce this artifact, including utilization of motion-resistant MR sequences. The findings of the study are interpreted in the context of reduced  sensitivity/specificity. Additionally, the post-contrast images of the neck could not be obtained. MRI HEAD FINDINGS Brain: The midline structures are normal. No focal diffusion restriction to indicate acute infarct. No intraparenchymal hemorrhage. Unchanged bilateral frontal and right parietal encephalomalacia. No mass lesion. Right basal ganglia chronic micro hemorrhagic foci. No hydrocephalus, age advanced atrophy or lobar predominant volume loss. No dural abnormality or extra-axial collection. Skull and upper cervical spine: Status post resection of anterior cranial fossa encephalocele. Severely limited evaluation of the skullbase at this location due to motion. Sinuses/Orbits: No fluid levels or advanced mucosal thickening. No mastoid effusion. Normal orbits. MRA HEAD FINDINGS Intracranial internal carotid arteries: Normal. Anterior cerebral arteries: Normal. Middle cerebral arteries: Normal. Posterior communicating arteries: Present bilaterally. Posterior cerebral arteries: Bilateral fetal origins. Basilar artery: Normal. Vertebral arteries: Left dominant. Right vertebral artery terminates in PICA. Limited assessment of the vessels in the posterior fossa. MRA NECK FINDINGS Severely motion degraded time-of-flight MRA images of the neck show patency of the cervical portions of the common carotid, internal carotid and vertebral arteries. No obvious proximal ICA stenosis. IMPRESSION: 1. Severely motion degraded examination. 2. No acute ischemic infarct. 3. No emergent large vessel occlusion. Otherwise limited MRA of the head and neck without obvious high-grade stenosis. 4. Unchanged bifrontal and right parietal encephalomalacia. 5. Severely degraded assessment of the anterior skullbase due to motion, limiting evaluation for residual encephalocele. Electronically Signed   By: Ulyses Jarred M.D.   On: 01/25/2017 21:13   Ct Head Code Stroke Wo Contrast  Result Date: 01/25/2017 CLINICAL DATA:  Code stroke. 83 y/o  F; left facial droop with altered mental status. EXAM: CT HEAD WITHOUT CONTRAST TECHNIQUE: Contiguous axial images were obtained from the base of the skull through the vertex without intravenous contrast. COMPARISON:  02/02/2016 MRI of the head. FINDINGS: Brain: Small region of right parietal and bilateral inferomedial frontal encephalomalacia. Several foci of hypoattenuation are present within subcortical and periventricular white matter similar FLAIR signal abnormality on prior MRI, probably chronic microvascular ischemic changes. Small lucency within  the left cerebellar hemisphere is new from prior MRI and may represent an interval small infarction. No evidence for large territory infarct, intracranial hemorrhage, or new focus of mass effect. Vascular: Calcific atherosclerosis of carotid siphons. No hyperdense vessel identified. Skull: Frontal craniotomy and right posterior ethmoid sinus postsurgical changes. High attenuation structure with calcifications centered within the cranial defect between right frontal cranial fossa and right posterior ethmoid air cells measuring 23 x 15 x 20 mm (AP x ML x CC series 5, image 19 and series 6, image 33) stable in comparison with the low signal structure in the same region on prior MRI of the brain. Sinuses/Orbits: Right sphenoid sinus partial opacification with chronic inflammatory changes of the wall of the sinus. Normal aeration of mastoid air cells. Other: None. ASPECTS [facility] Stroke Program Early CT Score) - Ganglionic level infarction (caudate, lentiform nuclei, internal capsule, insula, M1-M3 cortex): 7 - Supraganglionic infarction (M4-M6 cortex): 3 Total score (0-10 with 10 being normal): 10 IMPRESSION: 1. No acute intracranial abnormality identified. 2. Areas of encephalomalacia in the inferior medial frontal lobes and right parietal lobe stable from prior MRI. 3. Stable chronic microvascular ischemic changes and parenchymal volume loss of the brain. 4. Cranial  defect between right anterior cranial fossa and the right sphenoid sinus with a hyperdense masslike structure occupying the defect and extending into the right frontal lobe which may represent postsurgical changes or possibly a meningioma, stable from prior MRI given differences in technique. 5. ASPECTS is 10 These results were called by telephone at the time of interpretation on 01/25/2017 at 2:08 pm to Dr. Lorraine Lax who verbally acknowledged these results. Electronically Signed   By: Kristine Garbe M.D.   On: 01/25/2017 14:09    Microbiology: No results found for this or any previous visit (from the past 240 hour(s)).   Labs: Basic Metabolic Panel:  Recent Labs Lab 01/25/17 1340 01/25/17 1341 01/27/17 0419  NA 140 140 138  K 3.5 3.6 3.5  CL 103 105 104  CO2  --  21* 26  GLUCOSE 138* 138* 95  BUN 34* 34* 23*  CREATININE 1.60* 1.59* 1.21*  CALCIUM  --  8.5* 8.1*   Liver Function Tests:  Recent Labs Lab 01/25/17 1341  AST 22  ALT 14  ALKPHOS 73  BILITOT 0.5  PROT 6.2*  ALBUMIN 2.6*   No results for input(s): LIPASE, AMYLASE in the last 168 hours. No results for input(s): AMMONIA in the last 168 hours. CBC:  Recent Labs Lab 01/25/17 1340 01/25/17 1422  WBC  --  6.6  NEUTROABS 3.2  --   HGB 11.9* 11.4*  HCT 35.0* 35.2*  MCV  --  87.1  PLT  --  256   Cardiac Enzymes: No results for input(s): CKTOTAL, CKMB, CKMBINDEX, TROPONINI in the last 168 hours. BNP: BNP (last 3 results) No results for input(s): BNP in the last 8760 hours.  ProBNP (last 3 results) No results for input(s): PROBNP in the last 8760 hours.  CBG: No results for input(s): GLUCAP in the last 168 hours.     SignedLAYIA, WALLA  [facility] 01/27/2017, 1:17 PM

## 2017-01-27 NOTE — Care Management Note (Signed)
Case Management Note  Patient Details  Name: YEIMY BRABANT MRN: [id number] Date of Birth: 01/20/1929  Subjective/Objective:                    Action/Plan: Pt discharging home with family. Per family she is close to her baseline and they have been providing care for her at home. Daughter states they have all needed DME at home.  Daughter requesting [facility] transportation home. CM called and arranged pick up for around 3:45. Transport form on front of chart and bedside RN updated.   Expected Discharge Date:  01/27/17               Expected Discharge Plan:  Home/Self Care  In-House Referral:     Discharge planning Services  CM Consult, Other - See comment  Post Acute Care Choice:    Choice offered to:     DME Arranged:    DME Agency:     HH Arranged:    HH Agency:     Status of Service:  Completed, signed off  If discussed at [employer] of Stay Meetings, dates discussed:    Additional Comments:  Pollie Friar, RN 01/27/2017, 3:12 PM

## 2017-01-27 NOTE — Discharge Instructions (Signed)

## 2017-01-28 DIAGNOSIS — I5032 Chronic diastolic (congestive) heart failure: Secondary | ICD-10-CM | POA: Diagnosis not present

## 2017-01-28 DIAGNOSIS — I2699 Other pulmonary embolism without acute cor pulmonale: Secondary | ICD-10-CM | POA: Diagnosis not present

## 2017-01-28 DIAGNOSIS — J9601 Acute respiratory failure with hypoxia: Secondary | ICD-10-CM | POA: Diagnosis not present

## 2017-01-28 DIAGNOSIS — I6309 Cerebral infarction due to thrombosis of other precerebral artery: Secondary | ICD-10-CM | POA: Diagnosis not present

## 2017-01-29 ENCOUNTER — Telehealth: Payer: Self-pay

## 2017-01-29 ENCOUNTER — Telehealth: Payer: Self-pay | Admitting: Internal Medicine

## 2017-01-29 NOTE — Telephone Encounter (Signed)
Transition Care Management Follow-up Telephone Call Spoke to pt dtr.   Date discharged? 01/27/2017   How have you been since you were released from the hospital? Patient is feeling well and eating well also   Do you understand why you were in the hospital? Pt dtr does understand.    Do you understand the discharge instructions? Pt dtr does understand.    Where were you discharged to? Home   Items Reviewed:  Medications reviewed: Yes  Allergies reviewed: Yes  Dietary changes reviewed: Yes  Referrals reviewed: Yes   Functional Questionnaire:   Activities of Daily Living (ADLs):   States they are independent in the following: Eating States they require assistance with the following: All other due to being bed bound  Any transportation issues/concerns?: Yes. Other family member handles transportation.  Any patient concerns? No   Confirmed importance and date/time of follow-up visits scheduled Will Call back to schedule.   Provider Appointment booked with N/A  Confirmed with patient if condition begins to worsen call PCP or go to the ER.  Patient was given the office number and encouraged to call back with question or concerns: Yes

## 2017-01-29 NOTE — Telephone Encounter (Signed)
Requesting verbal for extension twice a week for four weeks effective the 21st for PT.  OK to leave vm.

## 2017-01-31 ENCOUNTER — Other Ambulatory Visit: Payer: Self-pay | Admitting: Internal Medicine

## 2017-01-31 DIAGNOSIS — I5032 Chronic diastolic (congestive) heart failure: Secondary | ICD-10-CM | POA: Diagnosis not present

## 2017-01-31 DIAGNOSIS — I6309 Cerebral infarction due to thrombosis of other precerebral artery: Secondary | ICD-10-CM | POA: Diagnosis not present

## 2017-01-31 DIAGNOSIS — I2699 Other pulmonary embolism without acute cor pulmonale: Secondary | ICD-10-CM | POA: Diagnosis not present

## 2017-01-31 DIAGNOSIS — J9601 Acute respiratory failure with hypoxia: Secondary | ICD-10-CM | POA: Diagnosis not present

## 2017-02-01 NOTE — Telephone Encounter (Signed)
Gave ok for verbal orders in JJ absence.

## 2017-02-02 ENCOUNTER — Telehealth: Payer: Self-pay | Admitting: Internal Medicine

## 2017-02-02 NOTE — Telephone Encounter (Signed)
Do you have a different contact number? I called the one provided but it was the wrong number.

## 2017-02-02 NOTE — Telephone Encounter (Signed)
Nicorette [phone] [facility]  Need verbal for PT  1 week 1 2 week 4 1 week 1

## 2017-02-04 NOTE — Telephone Encounter (Signed)
Phone Number [phone] Is the correct phone number

## 2017-02-04 NOTE — Telephone Encounter (Signed)
Ok was given for the verbal orders in Tiffin absence.

## 2017-02-05 ENCOUNTER — Other Ambulatory Visit: Payer: Self-pay | Admitting: Internal Medicine

## 2017-02-15 ENCOUNTER — Telehealth: Payer: Self-pay | Admitting: Internal Medicine

## 2017-02-15 NOTE — Telephone Encounter (Signed)
Patient Name: KASUMI Correll  DOB: Sep 28, 1928    Initial Comment Caller states her mother Jaqulyn has been gagging/vomiting since yesterday. It is clear mucus.    Nurse Assessment  Nurse: Rene Kocher, RN, [place] Date/Time (Eastern Time): 02/15/2017 10:10:06 AM  Confirm and document reason for call. If symptomatic, describe symptoms. ---caller states her mother has been gagging/vomiting since yesterday. pt typically does cough and produce mucous twice in an hour, but she has been doing this more frequently and had more mucous come up. mucous is watery and clear. pt vomited food this morning and has only vomited the one time. pt has gas build up and spits every 10 minutes. pt has had 4 bowel movements in the past 24 hours.  Does the patient have any new or worsening symptoms? ---Yes  Will a triage be completed? ---Yes  Related visit to physician within the last 2 weeks? ---Yes  Does the PT have any chronic conditions? (i.e. diabetes, asthma, etc.) ---Yes  List chronic conditions. ---history of stroke-last one was a year ago, bedridden, heart conditions  Is this a behavioral health or substance abuse call? ---No     Guidelines    Guideline Title Affirmed Question Affirmed Notes  High Blood Pressure [6] Systolic BP >= 962 OR Diastolic >= 80 AND [9] taking BP medications   Vomiting MILD or MODERATE vomiting (e.g., 1 - 5 times / day)    Final Disposition User   See Physician within [place], RN, [place]    Comments  pt is bedridden. pt gets up for meals during the day into a wheelchair. pt feels nauseas like she need to vomit. pt has not been drinking fluids as much. pt last urinated this morning.  pt was in hospital 2 wks ago and was given fluids at that time.  denies fever. BP 149/87 last night  pulse oximeter reading 80%. BP is currently 154/95  pulse oximeter reading is now 95%  pt over ate lunch yesterday and didn't want to eat dinner yesterday, so she started to have gas buildup and started to  cough up phlegm and vomiting /burping  pt is not drinking or eating and daughter worried about the gas buildup that is causing the vomiting  Appt with Dr. Mamie Nick tomorrow at 26 am.   Referrals  REFERRED TO PCP OFFICE   Caller Disagree/Comply Comply  Caller Understands Yes  PreDisposition Call Doctor

## 2017-02-16 ENCOUNTER — Ambulatory Visit: Payer: Medicare Other | Admitting: Internal Medicine

## 2017-02-16 ENCOUNTER — Encounter: Payer: Self-pay | Admitting: Internal Medicine

## 2017-02-16 DIAGNOSIS — I2699 Other pulmonary embolism without acute cor pulmonale: Secondary | ICD-10-CM | POA: Diagnosis not present

## 2017-02-16 DIAGNOSIS — R110 Nausea: Secondary | ICD-10-CM | POA: Diagnosis not present

## 2017-02-16 DIAGNOSIS — F0391 Unspecified dementia with behavioral disturbance: Secondary | ICD-10-CM

## 2017-02-16 DIAGNOSIS — R609 Edema, unspecified: Secondary | ICD-10-CM

## 2017-02-16 MED ORDER — PANTOPRAZOLE SODIUM 40 MG PO TBEC: 40.0000 mg | DELAYED_RELEASE_TABLET | Freq: Every day | ORAL | 3 refills | Status: DC

## 2017-02-16 MED ORDER — ONDANSETRON HCL 4 MG/2ML IJ SOLN
4.0000 mg | Freq: Once | INTRAMUSCULAR | Status: AC
  Administered 2017-02-16: 4 mg via INTRAMUSCULAR

## 2017-02-16 MED ORDER — RIVAROXABAN 10 MG PO TABS: 10.0000 mg | ORAL_TABLET | Freq: Every day | ORAL | 5 refills | Status: DC

## 2017-02-16 MED ORDER — ONDANSETRON HCL 4 MG PO TABS: 4.0000 mg | ORAL_TABLET | Freq: Three times a day (TID) | ORAL | 1 refills | Status: DC | PRN

## 2017-02-16 NOTE — Assessment & Plan Note (Signed)
  Hold  Donepezil, multivitamin if nauseated

## 2017-02-16 NOTE — Addendum Note (Signed)
Addended by: Karren Cobble on: 02/16/2017 12:19 PM   Modules accepted: Orders

## 2017-02-16 NOTE — Assessment & Plan Note (Signed)
Zofran IM Poss gastritis S/p recent inpatient w/up Hold Furosemide, Donepezil, multivitamin if nauseated Use Pantoprazole, Zofran po

## 2017-02-16 NOTE — Assessment & Plan Note (Signed)
Furosemide prn 

## 2017-02-16 NOTE — Assessment & Plan Note (Signed)
Will reduce Xarelto due to age/CRI

## 2017-02-16 NOTE — Progress Notes (Signed)
Subjective:  Patient ID: Heather [name], female    DOB: 08/05/1928  Age: 83 y.o. MRN: [id number]  CC: No chief complaint on file.   HPI Heather [name] presents for nausea and dry heaving since Sun. Comes w/dtr No vomiting, no diarrhea Feeling a little better  Ate a yogurt and water - it stayed down... Hosp d/c reviewed (10/15)  Outpatient Medications Prior to Visit  Medication Sig Dispense Refill  . acetaminophen (TYLENOL) 500 MG tablet Take 500 mg by mouth every 6 (six) hours as needed for moderate pain (knee, leg, stomach).    Marland Kitchen amLODipine (NORVASC) 5 MG tablet Take 1 tablet (5 mg total) by mouth daily. 90 tablet 3  . benazepril (LOTENSIN) 40 MG tablet Take 40 mg by mouth daily.  0  . benazepril (LOTENSIN) 40 MG tablet TAKE ONE TABLET BY MOUTH ONCE DAILY 90 tablet 3  . carvedilol (COREG) 25 MG tablet Take 1 tablet (25 mg total) by mouth 2 (two) times daily with a meal. 180 tablet 3  . donepezil (ARICEPT) 10 MG tablet Take 1 tablet daily 90 tablet 3  . furosemide (LASIX) 40 MG tablet Take 1 tablet (40 mg total) by mouth daily as needed. 90 tablet 3  . furosemide (LASIX) 40 MG tablet TAKE ONE TABLET BY MOUTH ONCE DAILY 90 tablet 3  . Multiple Vitamin (MULTIVITAMIN WITH MINERALS) TABS tablet Take 1 tablet by mouth daily. 51 ALIVE    . nystatin (MYCOSTATIN/NYSTOP) powder Use as directed twice per day as needed (Patient taking differently: Use as directed twice per day as needed for itching) 56.7 g 5  . OVER THE COUNTER MEDICATION Take 1 capsule by mouth daily. QUAIL OIL    . QUEtiapine (SEROQUEL) 50 MG tablet 1 tab by mouth in the AM 90 tablet 3  . rivaroxaban (XARELTO) 20 MG TABS tablet Take 1 tablet (20 mg total) by mouth daily with supper. 30 tablet 11  . traMADol (ULTRAM) 50 MG tablet Take 1 tablet (50 mg total) by mouth every 8 (eight) hours as needed. 90 tablet 2  . traZODone (DESYREL) 50 MG tablet Take 0.5-1 tablets (25-50 mg total) by mouth at bedtime as needed for sleep. 30 tablet  5   No facility-administered medications prior to visit.     ROS Review of Systems  Constitutional: Negative for activity change, appetite change, chills, fatigue and unexpected weight change.  HENT: Negative for congestion, mouth sores and sinus pressure.   Eyes: Negative for visual disturbance.  Respiratory: Negative for cough and chest tightness.   Gastrointestinal: Positive for nausea. Negative for abdominal pain, diarrhea and vomiting.  Genitourinary: Negative for difficulty urinating, frequency and vaginal pain.  Musculoskeletal: Negative for back pain and gait problem.  Skin: Negative for pallor and rash.  Neurological: Negative for dizziness, tremors, weakness, numbness and headaches.  Psychiatric/Behavioral: Positive for confusion and decreased concentration. Negative for sleep disturbance.    Objective:  BP 122/76 (BP Location: Right Arm, Patient Position: Sitting, Cuff Size: Large)   Pulse (!) 58   Temp 98.6 F (37 C) (Oral)   SpO2 95%   BP Readings from Last 3 Encounters:  02/16/17 122/76  01/27/17 (!) 125/53  01/01/17 116/70    Wt Readings from Last 3 Encounters:  01/25/17 250 lb (113.4 kg)  09/08/16 250 lb (113.4 kg)  06/12/16 238 lb (108 kg)    Physical Exam  Constitutional: She appears well-developed. No distress.  HENT:  Head: Normocephalic.  Right Ear: External ear normal.  Left Ear: External ear normal.  Nose: Nose normal.  Mouth/Throat: Oropharynx is clear and moist.  Eyes: Conjunctivae are normal. Pupils are equal, round, and reactive to light. Right eye exhibits no discharge. Left eye exhibits no discharge.  Neck: Normal range of motion. Neck supple. No JVD present. No tracheal deviation present. No thyromegaly present.  Cardiovascular: Normal rate, regular rhythm and normal heart sounds.  Pulmonary/Chest: No stridor. No respiratory distress. She has no wheezes.  Abdominal: Soft. Bowel sounds are normal. She exhibits no distension and no mass.  There is no tenderness. There is no rebound and no guarding.  Musculoskeletal: She exhibits no edema or tenderness.  Lymphadenopathy:    She has no cervical adenopathy.  Neurological: She displays normal reflexes. No cranial nerve deficit. She exhibits normal muscle tone. Coordination abnormal.  Skin: No rash noted. No erythema.  Psychiatric: She has a normal mood and affect. Her behavior is normal. Judgment normal.  w/c, somnolent, cooperative, non-toxic appearance NAD HEENT moist  Lab Results  Component Value Date   WBC 6.6 01/25/2017   HGB 11.4 (L) 01/25/2017   HCT 35.2 (L) 01/25/2017   PLT 256 01/25/2017   GLUCOSE 95 01/27/2017   CHOL 212 (H) 12/15/2016   TRIG 189.0 (H) 12/15/2016   HDL 31.80 (L) 12/15/2016   LDLDIRECT 139.0 01/01/2016   LDLCALC 142 (H) 12/15/2016   ALT 14 01/25/2017   AST 22 01/25/2017   NA 138 01/27/2017   K 3.5 01/27/2017   CL 104 01/27/2017   CREATININE 1.21 (H) 01/27/2017   BUN 23 (H) 01/27/2017   CO2 26 01/27/2017   TSH 3.43 12/15/2016   INR 2.43 01/25/2017   HGBA1C 6.4 12/15/2016    No results found.  Assessment & Plan:   There are no diagnoses linked to this encounter. I am having Heather [name] maintain her carvedilol, benazepril, acetaminophen, donepezil, traZODone, traMADol, QUEtiapine, nystatin, amLODipine, rivaroxaban, furosemide, multivitamin with minerals, OVER THE COUNTER MEDICATION, benazepril, and furosemide.  No orders of the defined types were placed in this encounter.    Follow-up: No Follow-up on file.  Walker Kehr, MD

## 2017-02-16 NOTE — Patient Instructions (Addendum)
Hold Furosemide, Donepezil, multivitamin if nauseated Use Pantoprazole, Zofran  F/u w/Dr Jenny Reichmann in 1 mo

## 2017-02-17 ENCOUNTER — Inpatient Hospital Stay: Payer: Medicare Other | Admitting: Internal Medicine

## 2017-02-27 DIAGNOSIS — I2699 Other pulmonary embolism without acute cor pulmonale: Secondary | ICD-10-CM | POA: Diagnosis not present

## 2017-02-27 DIAGNOSIS — J9601 Acute respiratory failure with hypoxia: Secondary | ICD-10-CM | POA: Diagnosis not present

## 2017-02-27 DIAGNOSIS — I5032 Chronic diastolic (congestive) heart failure: Secondary | ICD-10-CM | POA: Diagnosis not present

## 2017-02-28 DIAGNOSIS — I2699 Other pulmonary embolism without acute cor pulmonale: Secondary | ICD-10-CM | POA: Diagnosis not present

## 2017-02-28 DIAGNOSIS — I5032 Chronic diastolic (congestive) heart failure: Secondary | ICD-10-CM | POA: Diagnosis not present

## 2017-02-28 DIAGNOSIS — I6309 Cerebral infarction due to thrombosis of other precerebral artery: Secondary | ICD-10-CM | POA: Diagnosis not present

## 2017-02-28 DIAGNOSIS — J9601 Acute respiratory failure with hypoxia: Secondary | ICD-10-CM | POA: Diagnosis not present

## 2017-03-03 DIAGNOSIS — I2699 Other pulmonary embolism without acute cor pulmonale: Secondary | ICD-10-CM | POA: Diagnosis not present

## 2017-03-03 DIAGNOSIS — J9601 Acute respiratory failure with hypoxia: Secondary | ICD-10-CM | POA: Diagnosis not present

## 2017-03-03 DIAGNOSIS — I6309 Cerebral infarction due to thrombosis of other precerebral artery: Secondary | ICD-10-CM | POA: Diagnosis not present

## 2017-03-03 DIAGNOSIS — I5032 Chronic diastolic (congestive) heart failure: Secondary | ICD-10-CM | POA: Diagnosis not present

## 2017-03-06 ENCOUNTER — Other Ambulatory Visit: Payer: Self-pay | Admitting: Internal Medicine

## 2017-03-10 ENCOUNTER — Ambulatory Visit: Payer: Medicare Other | Admitting: Neurology

## 2017-03-10 ENCOUNTER — Encounter: Payer: Self-pay | Admitting: Neurology

## 2017-03-10 VITALS — BP 114/60 | HR 60 | Ht 61.0 in | Wt 225.0 lb

## 2017-03-10 DIAGNOSIS — F0390 Unspecified dementia without behavioral disturbance: Secondary | ICD-10-CM

## 2017-03-10 DIAGNOSIS — F03A0 Unspecified dementia, mild, without behavioral disturbance, psychotic disturbance, mood disturbance, and anxiety: Secondary | ICD-10-CM

## 2017-03-10 DIAGNOSIS — F320 Major depressive disorder, single episode, mild: Secondary | ICD-10-CM | POA: Diagnosis not present

## 2017-03-10 MED ORDER — DONEPEZIL HCL 10 MG PO TABS: ORAL_TABLET | ORAL | 3 refills | Status: DC

## 2017-03-10 MED ORDER — BUPROPION HCL 75 MG PO TABS: ORAL_TABLET | ORAL | 6 refills | Status: DC

## 2017-03-10 NOTE — Patient Instructions (Addendum)
1. Start Wellbutrin 75mg  every night to help with crying spells 2. Continue Donepezil 10mg  daily 3. We will get you connected with [employer] through the [employer] to help navigate getting more help at home 4. Follow-up in 6 months, call for any changes  FALL PRECAUTIONS: Be cautious when walking. Scan the area for obstacles that may increase the risk of trips and falls. When getting up in the mornings, sit up at the edge of the bed for a few minutes before getting out of bed. Consider elevating the bed at the head end to avoid drop of blood pressure when getting up. Walk always in a well-lit room (use night lights in the walls). Avoid area rugs or power cords from appliances in the middle of the walkways. Use a walker or a cane if necessary and consider physical therapy for balance exercise. Get your eyesight checked regularly.  FINANCIAL OVERSIGHT: Supervision, especially oversight when making financial decisions or transactions is also recommended.  HOME SAFETY: Consider the safety of the kitchen when operating appliances like stoves, microwave oven, and blender. Consider having supervision and share cooking responsibilities until no longer able to participate in those. Accidents with firearms and other hazards in the house should be identified and addressed as well.  DRIVING: Regarding driving, in patients with progressive memory problems, driving will be impaired. We advise to have someone else do the driving if trouble finding directions or if minor accidents are reported. Independent driving assessment is available to determine safety of driving.  ABILITY TO BE LEFT ALONE: If patient is unable to contact 911 operator, consider using [facility], or when the need is there, arrange for someone to stay with patients. Smoking is a fire hazard, consider supervision or cessation. Risk of wandering should be assessed by caregiver and if detected at any point, supervision and safe proof  recommendations should be instituted.  MEDICATION SUPERVISION: Inability to self-administer medication needs to be constantly addressed. Implement a mechanism to ensure safe administration of the medications.  RECOMMENDATIONS FOR ALL PATIENTS WITH MEMORY PROBLEMS: 1. Continue to exercise (Recommend 30 minutes of walking everyday, or 3 hours every week) 2. Increase social interactions - continue going to [place] and enjoy social gatherings with friends and family 3. Eat healthy, avoid fried foods and eat more fruits and vegetables 4. Maintain adequate blood pressure, blood sugar, and blood cholesterol level. Reducing the risk of stroke and cardiovascular disease also helps promoting better memory. 5. Avoid stressful situations. Live a simple life and avoid aggravations. Organize your time and prepare for the next day in anticipation. 6. Sleep well, avoid any interruptions of sleep and avoid any distractions in the bedroom that may interfere with adequate sleep quality 7. Avoid sugar, avoid sweets as there is a strong link between excessive sugar intake, diabetes, and cognitive impairment The Mediterranean diet  has been shown to help patients reduce the risk of progressive memory disorders and reduces cardiovascular risk. This includes eating fish, eat fruits and green leafy vegetables, nuts like almonds and hazelnuts, walnuts, and also use olive oil. Avoid fast foods and fried foods as much as possible. Avoid sweets and sugar as sugar use has been linked to worsening of memory function.  There is always a concern of gradual progression of memory problems. If this is the case, then we may need to adjust level of care according to patient needs. Support, both to the patient and caregiver, should then be put into place.

## 2017-03-10 NOTE — Progress Notes (Signed)
NEUROLOGY FOLLOW UP OFFICE NOTE  BENTLI LLORENTE [id number] 1928-06-29  HISTORY OF PRESENT ILLNESS: I had the pleasure of seeing Cleva Camero in follow-up in the neurology clinic on 03/10/2017.  The patient was last seen 6 months ago for mild dementia and is again accompanied by her daughter who helps supplement the history today.  Records and images were personally reviewed where available. MMSE in November 2017 was 19/30. She was started on Aricept 10mg  daily , which she is tolerating without side effects. She was in the hospital last 01/25/17 when she was not answering questions appropriately. CBC and CMP were unremarkable. No urinalysis on record. She had an MRI brain with no acute changes, bilateral frontal and right parietal encephalomalacia. Routine wake and drowsy EEG was normal. She was diagnosed with metabolic encephalopathy of unclear etiology, she was discharged in improved condition with home PT. Since coming home, she has been feeling better. She feels her memory is "too good." She mostly stays in bed, her daughter sits her up 4 times a day for meals. She can start feeling ill when sitting up for prolonged periods. She reports crying spells once in a while, her daughter reports 2 in one week, she asked for citalopram which she takes prn. When she was taking this regularly, she would have hallucinations. Her daughter reports the crying spells start with her waking up not knowing where she was, with a feeling of dread or impending doom. She is taking Seroquel 50mg  daily and Aricept 10mg  daily. Her daughter administers medications. She feels sleep is good, her daughter reports she mostly gets 4-5 hours of sleep, waking up multiple times to urinate, but half the time she is asking if it is morning yet. She can get stuck on an idea, for instance, she kept asking about people who were invited to their party, asking this at 2am. They have help coming in daily to help bathe and dress her. She is  wheelchair-bound.   HPI 03/09/2016: This is an 83 yo RH woman with a history of hypertension, hyperlipidemia, encephalocele s/p surgery in 2003, with memory loss and stroke in October 2017. She had seen her PCP on 01/01/16 for memory problems. An MRI brain was ordered, however while waiting for the scan, she started having shortness of breath and was admitted 01/24/16 and found to have DVTs in both lower extremities. She was also complaining of generalized weakness with difficulty ambulating. She was discharged home on Xarelto and home PT. She went for outpatient previously scheduled MRI brain on 02/03/16, which I personally reviewed, showing an acute/subacute small left periatrial infarct without associated hemorrhage. There was a remote moderate-sized right parietal lobe infarct. There was marked chronic microvascular change. Remote frontal craniotomy for repair of anterior encephalocele was seen, with significant surrounding encephalomalacia. There was moderate global atrophy without hydrocephalus. She was instructed to go to the ER, but since she had recent echocardiogram and was started on Xarelto, and had no clear deficits on exam, and she was discharged home with outpatient follow-up.   She reports that her memory has been "on and off" for conversations. She can hold a conversation then just "slacks off." Her daughter has noticed this and feels that she loses interest, cutting off conversations. She endorses some word-finding difficulties. Her daughter states that sometimes it is hard to say about her memory because of her "quirky sense of humor." For instance, this morning she said she did not want breakfast. After her daughter came home, she told  her she was hungry, so her daughter fixed food. Once made, she stated she did not want food. She only repeats herself when they are having conversations in the bathroom at 3am. She had previously been living with her husband until hospital admission, and has  been living with her daughter for the past 2 months. She denied any missed bill payments when she was living with her husband. She is pretty good with remembering to take her morning medications. She stopped driving due to mobility issues 3 years ago. She has been using a wheelchair since recent hospital discharge, however her daughter reports that for the past 2 years, she has not walked a lot because of bilateral leg weakness. She would use a walker and hold on to furniture. She feels her left arm and leg are weaker. She has occasional tingling in both hands. She needs assistance with dressing and bathing. Her daughter reports a couple of falls in the past 2 years, she would tell her daughter about them 1-2 weeks later. She denies any prior history of stroke. She denies any headaches, dizziness, diplopia, dysarthria/dysphagia. No personality changes or hallucinations. She is having some constipation, but her bigger issue is urinary urgency. She has the urge to urinate 4 to 6 times an hour at night, but once she sits on the commode, she does not urinate. During the day, she has the urge around 3 times an hour.   PAST MEDICAL HISTORY: Past Medical History:  Diagnosis Date  . ALLERGIC RHINITIS 12/09/2006  . DEGENERATIVE JOINT DISEASE, LEFT KNEE 01/23/2007  . Encephalocele ([place]) 01/23/2007  . FEVER UNSPECIFIED 01/28/2010  . GERD 12/09/2006  . GLUCOSE INTOLERANCE 01/24/2009  . GOUT 12/09/2006  . HYPERCHOLESTEROLEMIA 12/09/2006  . HYPERLIPIDEMIA 12/18/2006  . HYPERTENSION 12/09/2006  . Impaired glucose tolerance 07/26/2010  . MENOPAUSAL DISORDER 01/25/2008  . Stroke [facility])     MEDICATIONS: Current Outpatient Medications on File Prior to Visit  Medication Sig Dispense Refill  . acetaminophen (TYLENOL) 500 MG tablet Take 500 mg by mouth every 6 (six) hours as needed for moderate pain (knee, leg, stomach).    Marland Kitchen amLODipine (NORVASC) 5 MG tablet Take 1 tablet (5 mg total) by mouth daily. 90 tablet 3  .  benazepril (LOTENSIN) 40 MG tablet TAKE ONE TABLET BY MOUTH ONCE DAILY 90 tablet 3  . carvedilol (COREG) 25 MG tablet TAKE ONE TABLET BY MOUTH TWICE DAILY WITH MEALS 180 tablet 3  . donepezil (ARICEPT) 10 MG tablet Take 1 tablet daily 90 tablet 3  . furosemide (LASIX) 40 MG tablet TAKE ONE TABLET BY MOUTH ONCE DAILY 90 tablet 3  . Multiple Vitamin (MULTIVITAMIN WITH MINERALS) TABS tablet Take 1 tablet by mouth daily. 71 ALIVE    . nystatin (MYCOSTATIN/NYSTOP) powder Use as directed twice per day as needed (Patient taking differently: Use as directed twice per day as needed for itching) 56.7 g 5  . ondansetron (ZOFRAN) 4 MG tablet Take 1 tablet (4 mg total) every 8 (eight) hours as needed by mouth for nausea or vomiting. 20 tablet 1  . OVER THE COUNTER MEDICATION Take 1 capsule by mouth daily. QUAIL OIL    . pantoprazole (PROTONIX) 40 MG tablet Take 1 tablet (40 mg total) daily by mouth. 30 tablet 3  . QUEtiapine (SEROQUEL) 50 MG tablet 1 tab by mouth in the AM 90 tablet 3  . rivaroxaban (XARELTO) 10 MG TABS tablet Take 1 tablet (10 mg total) daily by mouth. 30 tablet 5  . traMADol (  ULTRAM) 50 MG tablet Take 1 tablet (50 mg total) by mouth every 8 (eight) hours as needed. 90 tablet 2  . traZODone (DESYREL) 50 MG tablet Take 0.5-1 tablets (25-50 mg total) by mouth at bedtime as needed for sleep. 30 tablet 5   No current facility-administered medications on file prior to visit.     ALLERGIES: Allergies  Allergen Reactions  . Lipitor [Atorvastatin] Other (See Comments)    myalgia  . Codeine Other (See Comments)  . Lovastatin Other (See Comments)  . Pravastatin Sodium Other (See Comments)    FAMILY HISTORY: Family History  Problem Relation Age of Onset  . Hypertension Other     SOCIAL HISTORY: Social History   Socioeconomic History  . Marital status: Married    Spouse name: Not on file  . Number of children: 7  . Years of education: Not on file  . Highest education level: Not on  file  Social Needs  . Financial resource strain: Not on file  . Food insecurity - worry: Not on file  . Food insecurity - inability: Not on file  . Transportation needs - medical: Not on file  . Transportation needs - non-medical: Not on file  Occupational History  . Occupation: Musician  Tobacco Use  . Smoking status: Never Smoker  . Smokeless tobacco: Never Used  Substance and Sexual Activity  . Alcohol use: No  . Drug use: No  . Sexual activity: No  Other Topics Concern  . Not on file  Social History Narrative  . Not on file    REVIEW OF SYSTEMS: Constitutional: No fevers, chills, or sweats, no generalized fatigue, change in appetite Eyes: No visual changes, double vision, eye pain Ear, nose and throat: No hearing loss, ear pain, nasal congestion, sore throat Cardiovascular: No chest pain, palpitations Respiratory:  No shortness of breath at rest or with exertion, wheezes GastrointestinaI: No nausea, vomiting, diarrhea, abdominal pain, fecal incontinence Genitourinary:  No dysuria, urinary retention or frequency. +urgency Musculoskeletal:  No neck pain, back pain Integumentary: No rash, pruritus, skin lesions Neurological: as above Psychiatric: No depression, insomnia, anxiety Endocrine: No palpitations, fatigue, diaphoresis, mood swings, change in appetite, change in weight, increased thirst Hematologic/Lymphatic:  No anemia, purpura, petechiae. Allergic/Immunologic: no itchy/runny eyes, nasal congestion, recent allergic reactions, rashes  PHYSICAL EXAM: Vitals:   03/10/17 1500  BP: 114/60  Pulse: 60  SpO2: 96%   General: No acute distress, sitting in wheelchair, drowsy but easily arousable Head:  Normocephalic/atraumatic Neck: supple, no paraspinal tenderness, full range of motion Heart:  Regular rate and rhythm Lungs:  Clear to auscultation bilaterally Back: No paraspinal tenderness Skin/Extremities: No rash, no edema Neurological Exam:  alert and oriented to person, place, states it is November 2017. No aphasia or dysarthria. Fund of knowledge is reduced. Recent and remote memory are impaired. Attention and concentration are normal. Able to name objects and repeat phrases.  MMSE - Mini Mental State Exam 03/10/2017 03/09/2016  Orientation to time 4 3  Orientation to Place 5 5  Registration 3 3  Attention/ Calculation 4 0  Recall 1 1  Language- name 2 objects 2 2  Language- repeat 1 1  Language- follow 3 step command 3 3  Language- read & follow direction 1 1  Write a sentence 0 0  Copy design 0 0  Total score 24 19   Cranial nerves: Pupils equal, round, reactive to light.  Extraocular movements intact with no nystagmus. Visual fields full. Facial sensation intact.  No facial asymmetry. Tongue, uvula, palate midline.  Motor: Bulk and tone normal, muscle strength 5/5 throughout with no pronator drift.  Sensation to light touch intact.  No extinction to double simultaneous stimulation.  Deep tendon reflexes 2+ throughout, toes downgoing.  Finger to nose testing intact.  Gait not tested, sitting on wheelchair.  IMPRESSION: This is an 83 yo RH woman with a history of hypertension, hyperlipidemia, encephalocele s/p surgery in 2003, with mild dementia and subcortical stroke in October 2017. MMSE today 24/28 (unable to see well to write or copy pentagons). Continue Aricept 10mg  daily. We discussed crying spells, she had side effects on Celexa and will start low dose Wellbutrin 75mg  qhs, side effects were discussed. Continue Seroquel 50mg  daily. She is on Xarelto for DVTs. Continue control of vascular risk factors for secondary stroke prevention.   After her visit, I was called into the room with her daughter calling staff that she became unresponsive. She reported feeling hot and nauseated, then she slumped down and was unresponsive for a few minutes. I examined the patient, head was slumped down, eyes appeared to be deviated to the right  side, unresponsive. BP was 104/58, HR was 41. She started dry heaving then started vomiting, then started responding to questions and following commands with no focal weakness noted. She reported feeling dizzy. EMS was called and patient was evaluated, HR 52, BP 107/52, patient awake and back to baseline but feeling tired and weak. Daughter decided to bring patient home and monitor from home. She knows to call EMS for any significant changes.  Thank you for allowing me to participate in her care.  Please do not hesitate to call for any questions or concerns.  The duration of this appointment visit was 25 minutes of face-to-face time with the patient.  Greater than 50% of this time was spent in counseling, explanation of diagnosis, planning of further management, and coordination of care.   Ellouise Newer, M.D.   CC: Dr. Jenny Reichmann

## 2017-03-15 ENCOUNTER — Encounter: Payer: Self-pay | Admitting: Neurology

## 2017-03-29 DIAGNOSIS — I5032 Chronic diastolic (congestive) heart failure: Secondary | ICD-10-CM | POA: Diagnosis not present

## 2017-03-29 DIAGNOSIS — I2699 Other pulmonary embolism without acute cor pulmonale: Secondary | ICD-10-CM | POA: Diagnosis not present

## 2017-03-29 DIAGNOSIS — J9601 Acute respiratory failure with hypoxia: Secondary | ICD-10-CM | POA: Diagnosis not present

## 2017-03-30 DIAGNOSIS — J9601 Acute respiratory failure with hypoxia: Secondary | ICD-10-CM | POA: Diagnosis not present

## 2017-03-30 DIAGNOSIS — I5032 Chronic diastolic (congestive) heart failure: Secondary | ICD-10-CM | POA: Diagnosis not present

## 2017-03-30 DIAGNOSIS — I2699 Other pulmonary embolism without acute cor pulmonale: Secondary | ICD-10-CM | POA: Diagnosis not present

## 2017-03-30 DIAGNOSIS — I6309 Cerebral infarction due to thrombosis of other precerebral artery: Secondary | ICD-10-CM | POA: Diagnosis not present

## 2017-04-02 DIAGNOSIS — I2699 Other pulmonary embolism without acute cor pulmonale: Secondary | ICD-10-CM | POA: Diagnosis not present

## 2017-04-02 DIAGNOSIS — J9601 Acute respiratory failure with hypoxia: Secondary | ICD-10-CM | POA: Diagnosis not present

## 2017-04-02 DIAGNOSIS — I6309 Cerebral infarction due to thrombosis of other precerebral artery: Secondary | ICD-10-CM | POA: Diagnosis not present

## 2017-04-02 DIAGNOSIS — I5032 Chronic diastolic (congestive) heart failure: Secondary | ICD-10-CM | POA: Diagnosis not present

## 2017-04-26 ENCOUNTER — Other Ambulatory Visit: Payer: Self-pay | Admitting: Internal Medicine

## 2017-04-29 DIAGNOSIS — I2699 Other pulmonary embolism without acute cor pulmonale: Secondary | ICD-10-CM | POA: Diagnosis not present

## 2017-04-29 DIAGNOSIS — J9601 Acute respiratory failure with hypoxia: Secondary | ICD-10-CM | POA: Diagnosis not present

## 2017-04-29 DIAGNOSIS — I5032 Chronic diastolic (congestive) heart failure: Secondary | ICD-10-CM | POA: Diagnosis not present

## 2017-04-30 DIAGNOSIS — I5032 Chronic diastolic (congestive) heart failure: Secondary | ICD-10-CM | POA: Diagnosis not present

## 2017-04-30 DIAGNOSIS — I2699 Other pulmonary embolism without acute cor pulmonale: Secondary | ICD-10-CM | POA: Diagnosis not present

## 2017-04-30 DIAGNOSIS — I6309 Cerebral infarction due to thrombosis of other precerebral artery: Secondary | ICD-10-CM | POA: Diagnosis not present

## 2017-04-30 DIAGNOSIS — J9601 Acute respiratory failure with hypoxia: Secondary | ICD-10-CM | POA: Diagnosis not present

## 2017-05-03 DIAGNOSIS — J9601 Acute respiratory failure with hypoxia: Secondary | ICD-10-CM | POA: Diagnosis not present

## 2017-05-03 DIAGNOSIS — I5032 Chronic diastolic (congestive) heart failure: Secondary | ICD-10-CM | POA: Diagnosis not present

## 2017-05-03 DIAGNOSIS — I2699 Other pulmonary embolism without acute cor pulmonale: Secondary | ICD-10-CM | POA: Diagnosis not present

## 2017-05-03 DIAGNOSIS — I6309 Cerebral infarction due to thrombosis of other precerebral artery: Secondary | ICD-10-CM | POA: Diagnosis not present

## 2017-05-17 IMAGING — DX DG CHEST 2V
2 series · 2 of 2 positions shown · non-contrast
Comparison: None available

CLINICAL DATA: One week of shortness of breath

EXAM:
CHEST  2 VIEW

[chest ap]
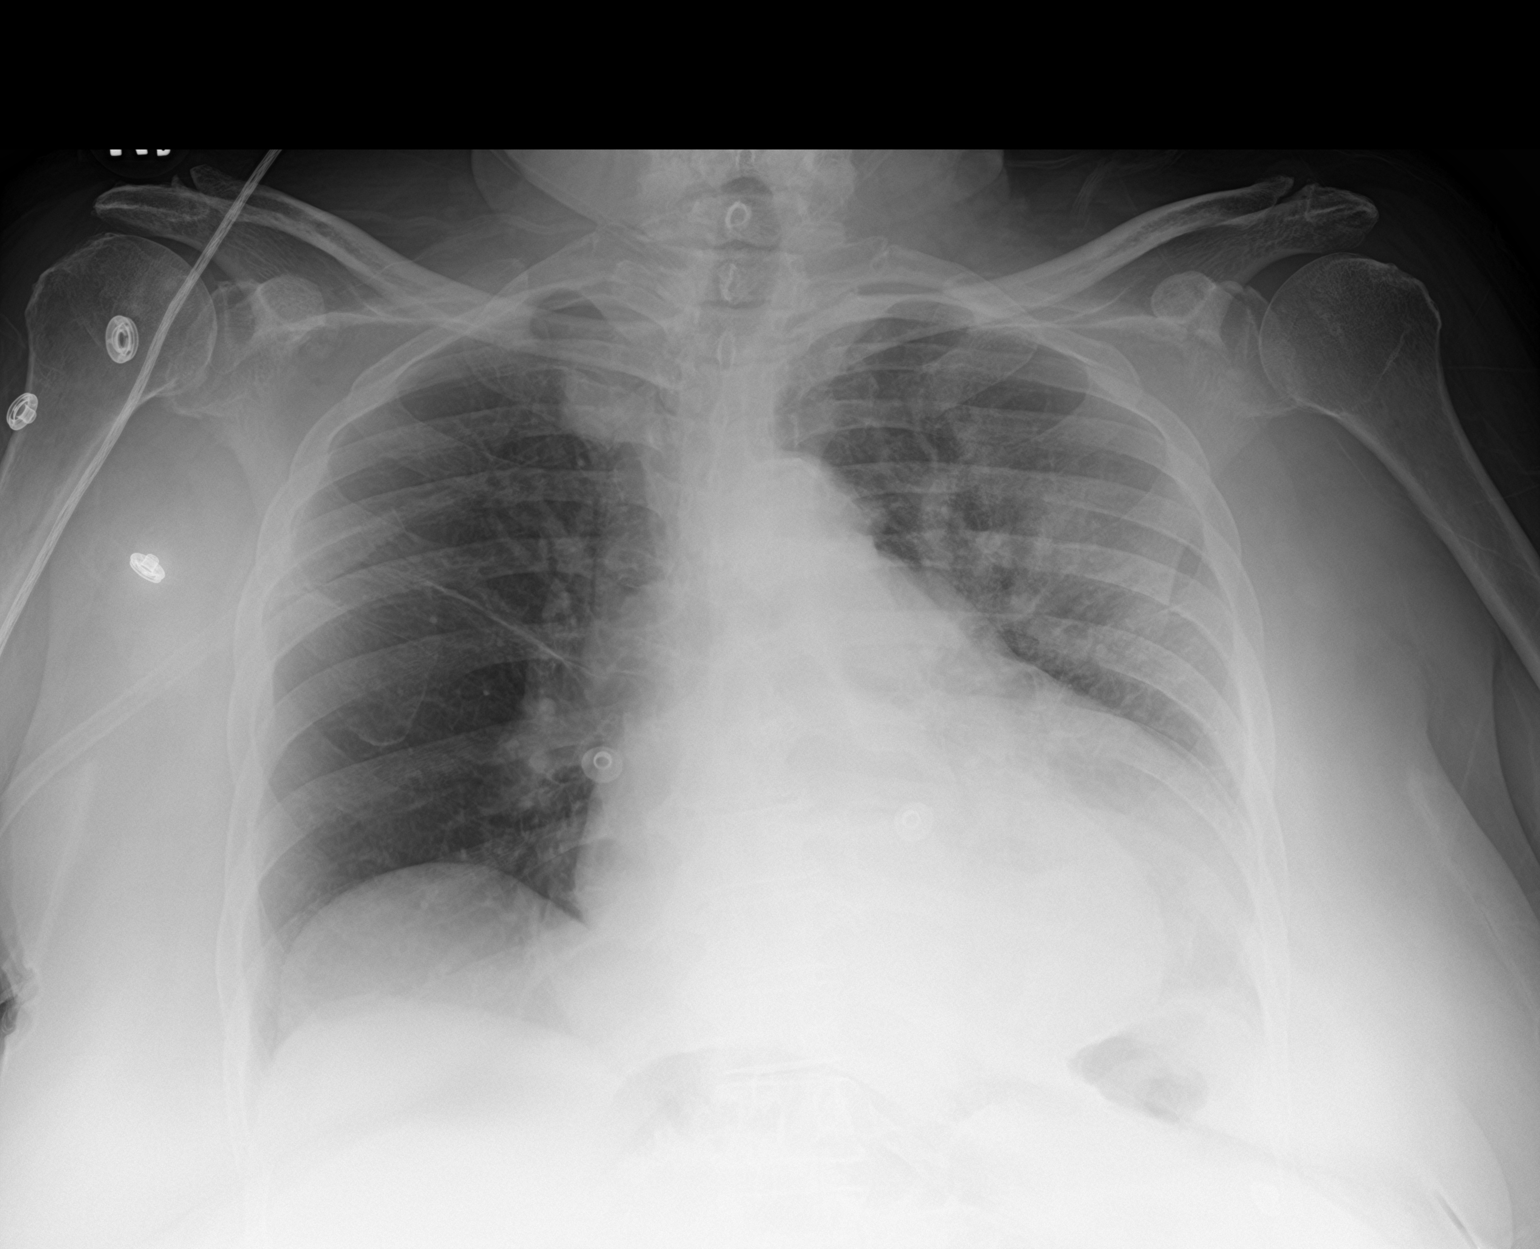

[chest lat]
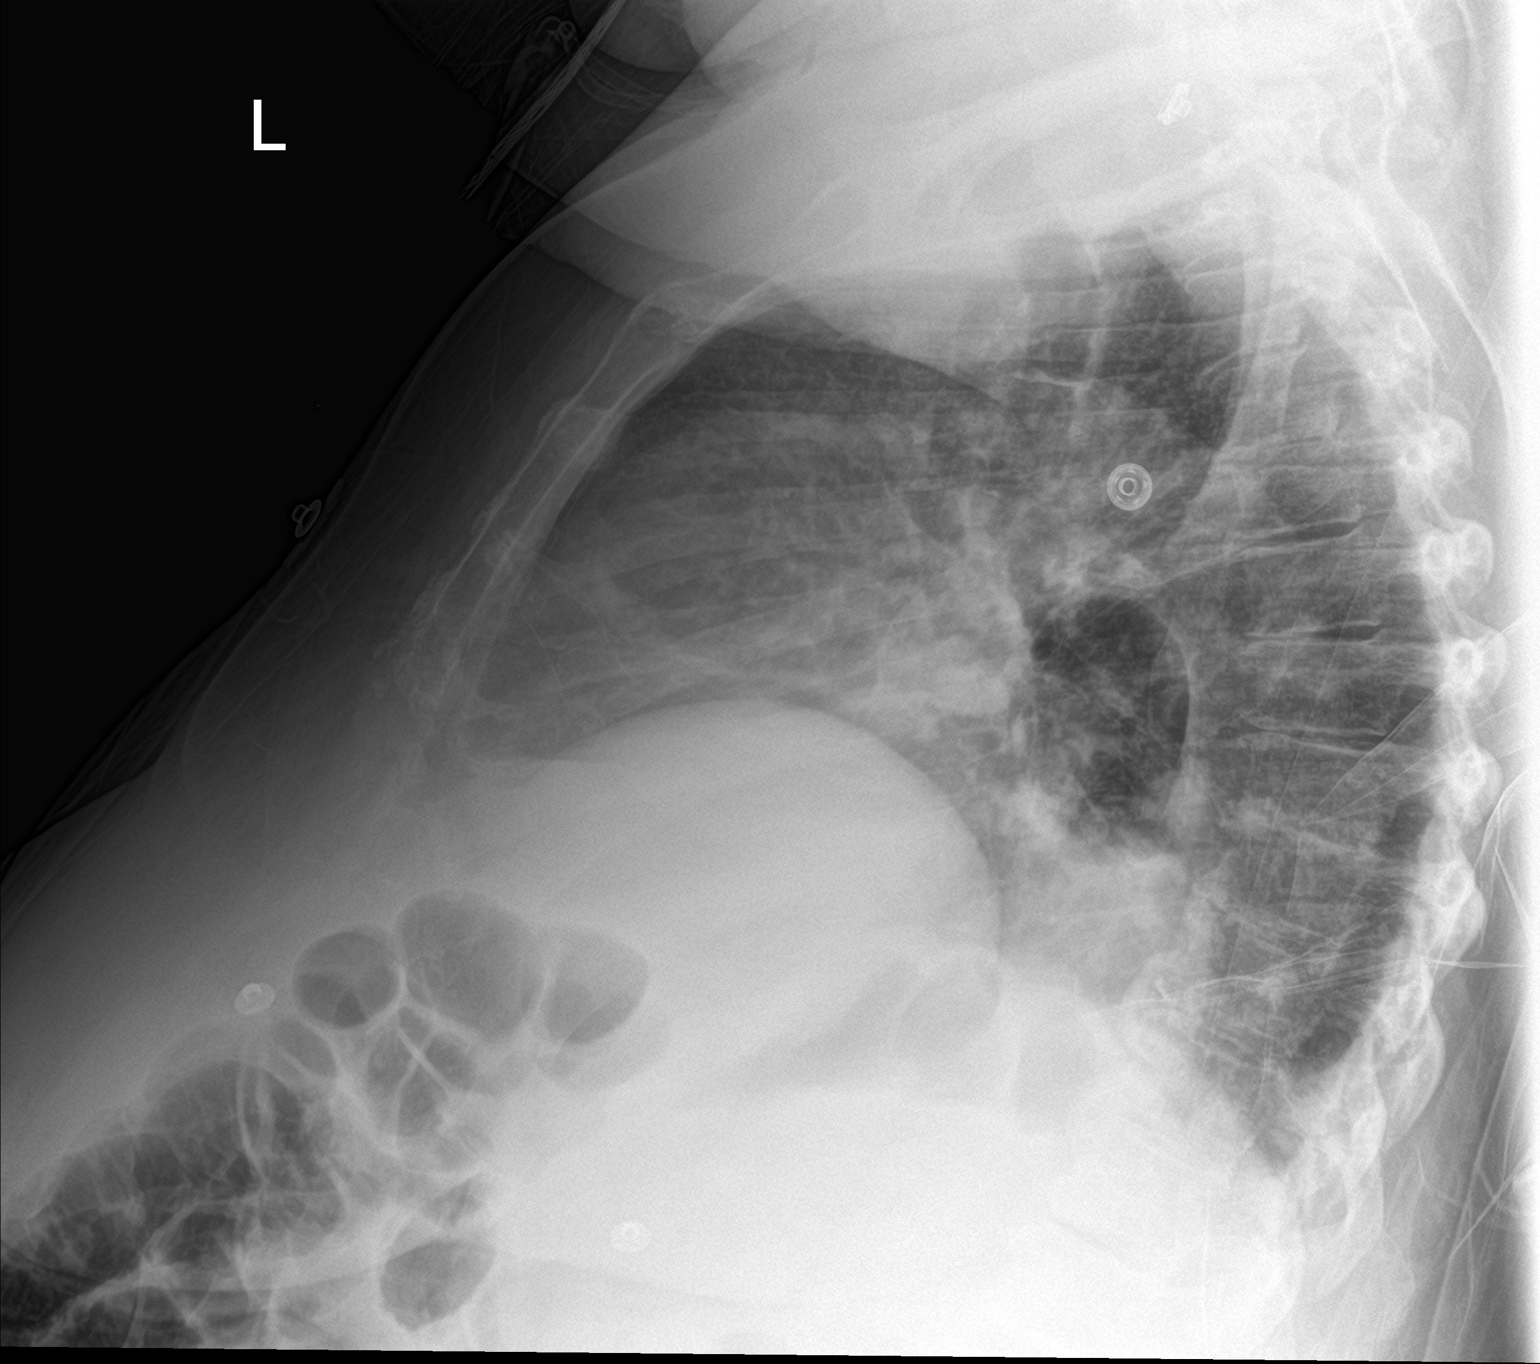

[2 of 2 positions shown; findings below may reference images not displayed]

FINDINGS: AP and lateral views of the chest. Low lung volumes. Eventration of
right diaphragm. Hazy left upper lobe infiltrate. There is mild
cardiomegaly with atherosclerosis of the aorta. Linear scar
atelectasis in the right mid lung zone. Double density overlying the
lower mediastinum, could relate to a large hiatal hernia.
IMPRESSION: 1. Mild cardiomegaly.
2. Hazy opacity in the left lung could relate to mild infiltrate or
asymmetric edema.
3. Oval opacity projects over lower mediastinum, this could relate
to a large hiatal hernia. CT or swallow study could be obtained to
confirm.
4. Atherosclerotic vascular disease of the aorta.

## 2017-05-30 DIAGNOSIS — J9601 Acute respiratory failure with hypoxia: Secondary | ICD-10-CM | POA: Diagnosis not present

## 2017-05-30 DIAGNOSIS — I2699 Other pulmonary embolism without acute cor pulmonale: Secondary | ICD-10-CM | POA: Diagnosis not present

## 2017-05-30 DIAGNOSIS — I5032 Chronic diastolic (congestive) heart failure: Secondary | ICD-10-CM | POA: Diagnosis not present

## 2017-05-31 DIAGNOSIS — J9601 Acute respiratory failure with hypoxia: Secondary | ICD-10-CM | POA: Diagnosis not present

## 2017-05-31 DIAGNOSIS — I5032 Chronic diastolic (congestive) heart failure: Secondary | ICD-10-CM | POA: Diagnosis not present

## 2017-05-31 DIAGNOSIS — I2699 Other pulmonary embolism without acute cor pulmonale: Secondary | ICD-10-CM | POA: Diagnosis not present

## 2017-05-31 DIAGNOSIS — I6309 Cerebral infarction due to thrombosis of other precerebral artery: Secondary | ICD-10-CM | POA: Diagnosis not present

## 2017-06-03 DIAGNOSIS — I5032 Chronic diastolic (congestive) heart failure: Secondary | ICD-10-CM | POA: Diagnosis not present

## 2017-06-03 DIAGNOSIS — I2699 Other pulmonary embolism without acute cor pulmonale: Secondary | ICD-10-CM | POA: Diagnosis not present

## 2017-06-03 DIAGNOSIS — I6309 Cerebral infarction due to thrombosis of other precerebral artery: Secondary | ICD-10-CM | POA: Diagnosis not present

## 2017-06-03 DIAGNOSIS — J9601 Acute respiratory failure with hypoxia: Secondary | ICD-10-CM | POA: Diagnosis not present

## 2017-06-04 ENCOUNTER — Other Ambulatory Visit: Payer: Self-pay | Admitting: Internal Medicine

## 2017-06-10 ENCOUNTER — Other Ambulatory Visit: Payer: Self-pay | Admitting: Internal Medicine

## 2017-06-10 NOTE — Telephone Encounter (Signed)
Done erx 

## 2017-06-23 ENCOUNTER — Inpatient Hospital Stay (HOSPITAL_COMMUNITY)
Admission: EM | Admit: 2017-06-23 | Discharge: 2017-06-27 | DRG: 309 | Disposition: A | Discharge status: Home Health | Payer: Medicare Other | Attending: Family Medicine | Admitting: Family Medicine

## 2017-06-23 ENCOUNTER — Emergency Department (HOSPITAL_COMMUNITY): Payer: Medicare Other

## 2017-06-23 ENCOUNTER — Encounter (HOSPITAL_COMMUNITY): Payer: Self-pay | Admitting: Emergency Medicine

## 2017-06-23 DIAGNOSIS — N183 Chronic kidney disease, stage 3 unspecified: Secondary | ICD-10-CM | POA: Diagnosis present

## 2017-06-23 DIAGNOSIS — M1712 Unilateral primary osteoarthritis, left knee: Secondary | ICD-10-CM | POA: Diagnosis not present

## 2017-06-23 DIAGNOSIS — K219 Gastro-esophageal reflux disease without esophagitis: Secondary | ICD-10-CM | POA: Diagnosis present

## 2017-06-23 DIAGNOSIS — Z6838 Body mass index (BMI) 38.0-38.9, adult: Secondary | ICD-10-CM

## 2017-06-23 DIAGNOSIS — Z8673 Personal history of transient ischemic attack (TIA), and cerebral infarction without residual deficits: Secondary | ICD-10-CM | POA: Diagnosis not present

## 2017-06-23 DIAGNOSIS — Z86718 Personal history of other venous thrombosis and embolism: Secondary | ICD-10-CM

## 2017-06-23 DIAGNOSIS — F0391 Unspecified dementia with behavioral disturbance: Secondary | ICD-10-CM | POA: Diagnosis not present

## 2017-06-23 DIAGNOSIS — F03918 Unspecified dementia, unspecified severity, with other behavioral disturbance: Secondary | ICD-10-CM | POA: Diagnosis present

## 2017-06-23 DIAGNOSIS — T50905A Adverse effect of unspecified drugs, medicaments and biological substances, initial encounter: Secondary | ICD-10-CM

## 2017-06-23 DIAGNOSIS — E785 Hyperlipidemia, unspecified: Secondary | ICD-10-CM | POA: Diagnosis present

## 2017-06-23 DIAGNOSIS — Z7401 Bed confinement status: Secondary | ICD-10-CM | POA: Diagnosis not present

## 2017-06-23 DIAGNOSIS — E038 Other specified hypothyroidism: Secondary | ICD-10-CM | POA: Diagnosis present

## 2017-06-23 DIAGNOSIS — Z79899 Other long term (current) drug therapy: Secondary | ICD-10-CM

## 2017-06-23 DIAGNOSIS — K449 Diaphragmatic hernia without obstruction or gangrene: Secondary | ICD-10-CM | POA: Diagnosis not present

## 2017-06-23 DIAGNOSIS — R404 Transient alteration of awareness: Secondary | ICD-10-CM | POA: Diagnosis not present

## 2017-06-23 DIAGNOSIS — Z888 Allergy status to other drugs, medicaments and biological substances status: Secondary | ICD-10-CM | POA: Diagnosis not present

## 2017-06-23 DIAGNOSIS — I2699 Other pulmonary embolism without acute cor pulmonale: Secondary | ICD-10-CM | POA: Diagnosis not present

## 2017-06-23 DIAGNOSIS — R55 Syncope and collapse: Secondary | ICD-10-CM | POA: Diagnosis not present

## 2017-06-23 DIAGNOSIS — I130 Hypertensive heart and chronic kidney disease with heart failure and stage 1 through stage 4 chronic kidney disease, or unspecified chronic kidney disease: Secondary | ICD-10-CM | POA: Diagnosis not present

## 2017-06-23 DIAGNOSIS — E039 Hypothyroidism, unspecified: Secondary | ICD-10-CM | POA: Diagnosis not present

## 2017-06-23 DIAGNOSIS — R001 Bradycardia, unspecified: Principal | ICD-10-CM | POA: Diagnosis present

## 2017-06-23 DIAGNOSIS — Z885 Allergy status to narcotic agent status: Secondary | ICD-10-CM | POA: Diagnosis not present

## 2017-06-23 DIAGNOSIS — T447X5A Adverse effect of beta-adrenoreceptor antagonists, initial encounter: Secondary | ICD-10-CM | POA: Diagnosis not present

## 2017-06-23 DIAGNOSIS — I440 Atrioventricular block, first degree: Secondary | ICD-10-CM | POA: Diagnosis not present

## 2017-06-23 DIAGNOSIS — E02 Subclinical iodine-deficiency hypothyroidism: Secondary | ICD-10-CM | POA: Diagnosis not present

## 2017-06-23 DIAGNOSIS — R0602 Shortness of breath: Secondary | ICD-10-CM | POA: Diagnosis not present

## 2017-06-23 DIAGNOSIS — I10 Essential (primary) hypertension: Secondary | ICD-10-CM | POA: Diagnosis not present

## 2017-06-23 DIAGNOSIS — J9601 Acute respiratory failure with hypoxia: Secondary | ICD-10-CM | POA: Diagnosis not present

## 2017-06-23 DIAGNOSIS — E7439 Other disorders of intestinal carbohydrate absorption: Secondary | ICD-10-CM | POA: Diagnosis present

## 2017-06-23 DIAGNOSIS — Z7901 Long term (current) use of anticoagulants: Secondary | ICD-10-CM

## 2017-06-23 DIAGNOSIS — I959 Hypotension, unspecified: Secondary | ICD-10-CM | POA: Diagnosis not present

## 2017-06-23 DIAGNOSIS — I351 Nonrheumatic aortic (valve) insufficiency: Secondary | ICD-10-CM | POA: Diagnosis not present

## 2017-06-23 DIAGNOSIS — M7989 Other specified soft tissue disorders: Secondary | ICD-10-CM | POA: Diagnosis not present

## 2017-06-23 DIAGNOSIS — Z87728 Personal history of other specified (corrected) congenital malformations of nervous system and sense organs: Secondary | ICD-10-CM | POA: Diagnosis not present

## 2017-06-23 DIAGNOSIS — Z993 Dependence on wheelchair: Secondary | ICD-10-CM

## 2017-06-23 DIAGNOSIS — I5032 Chronic diastolic (congestive) heart failure: Secondary | ICD-10-CM | POA: Diagnosis not present

## 2017-06-23 DIAGNOSIS — J9611 Chronic respiratory failure with hypoxia: Secondary | ICD-10-CM | POA: Diagnosis present

## 2017-06-23 DIAGNOSIS — R531 Weakness: Secondary | ICD-10-CM | POA: Diagnosis not present

## 2017-06-23 LAB — T4, FREE: Free T4: 0.84 ng/dL (ref 0.61–1.12)

## 2017-06-23 LAB — TSH: TSH: 8.644 u[IU]/mL — ABNORMAL HIGH (ref 0.350–4.500)

## 2017-06-23 LAB — CBC WITH DIFFERENTIAL/PLATELET
Basophils Absolute: 0.1 K/uL (ref 0.0–0.1)
Basophils Relative: 1 %
Eosinophils Absolute: 0.3 K/uL (ref 0.0–0.7)
Eosinophils Relative: 7 %
HCT: 36.9 % (ref 36.0–46.0)
Hemoglobin: 11.4 g/dL — ABNORMAL LOW (ref 12.0–15.0)
Lymphocytes Relative: 43 %
Lymphs Abs: 1.9 K/uL (ref 0.7–4.0)
MCH: 26.7 pg (ref 26.0–34.0)
MCHC: 30.9 g/dL (ref 30.0–36.0)
MCV: 86.4 fL (ref 78.0–100.0)
Monocytes Absolute: 0.4 K/uL (ref 0.1–1.0)
Monocytes Relative: 10 %
Neutro Abs: 1.7 K/uL (ref 1.7–7.7)
Neutrophils Relative %: 39 %
Platelets: 253 K/uL (ref 150–400)
RBC: 4.27 MIL/uL (ref 3.87–5.11)
RDW: 16.7 % — ABNORMAL HIGH (ref 11.5–15.5)
WBC: 4.3 K/uL (ref 4.0–10.5)

## 2017-06-23 LAB — COMPREHENSIVE METABOLIC PANEL WITH GFR
ALT: 13 U/L — ABNORMAL LOW (ref 14–54)
AST: 19 U/L (ref 15–41)
Albumin: 2.6 g/dL — ABNORMAL LOW (ref 3.5–5.0)
Alkaline Phosphatase: 74 U/L (ref 38–126)
Anion gap: 8 (ref 5–15)
BUN: 19 mg/dL (ref 6–20)
CO2: 24 mmol/L (ref 22–32)
Calcium: 8.8 mg/dL — ABNORMAL LOW (ref 8.9–10.3)
Chloride: 106 mmol/L (ref 101–111)
Creatinine, Ser: 1.05 mg/dL — ABNORMAL HIGH (ref 0.44–1.00)
GFR calc Af Amer: 54 mL/min — ABNORMAL LOW
GFR calc non Af Amer: 47 mL/min — ABNORMAL LOW
Glucose, Bld: 177 mg/dL — ABNORMAL HIGH (ref 65–99)
Potassium: 4 mmol/L (ref 3.5–5.1)
Sodium: 138 mmol/L (ref 135–145)
Total Bilirubin: 0.8 mg/dL (ref 0.3–1.2)
Total Protein: 6.3 g/dL — ABNORMAL LOW (ref 6.5–8.1)

## 2017-06-23 LAB — CBG MONITORING, ED: Glucose-Capillary: 158 mg/dL — ABNORMAL HIGH (ref 65–99)

## 2017-06-23 LAB — I-STAT TROPONIN, ED
Troponin i, poc: 0 ng/mL (ref 0.00–0.08)
Troponin i, poc: 0 ng/mL (ref 0.00–0.08)

## 2017-06-23 LAB — MAGNESIUM: Magnesium: 2.3 mg/dL (ref 1.7–2.4)

## 2017-06-23 LAB — BRAIN NATRIURETIC PEPTIDE: B Natriuretic Peptide: 141.6 pg/mL — ABNORMAL HIGH (ref 0.0–100.0)

## 2017-06-23 MED ORDER — SODIUM CHLORIDE 0.9% FLUSH
3.0000 mL | INTRAVENOUS | Status: DC | PRN
Start: 2017-06-23 — End: 2017-06-25

## 2017-06-23 MED ORDER — AMLODIPINE BESYLATE 5 MG PO TABS
5.0000 mg | ORAL_TABLET | Freq: Every day | ORAL | Status: DC
  Administered 2017-06-23: 5 mg via ORAL
  Filled 2017-06-23: qty 1

## 2017-06-23 MED ORDER — TRAMADOL HCL 50 MG PO TABS: 50.0000 mg | ORAL_TABLET | Freq: Three times a day (TID) | ORAL | Status: DC | PRN

## 2017-06-23 MED ORDER — ONDANSETRON HCL 4 MG/2ML IJ SOLN
4.0000 mg | Freq: Once | INTRAMUSCULAR | Status: AC
  Administered 2017-06-23: 4 mg via INTRAVENOUS
  Filled 2017-06-23: qty 2

## 2017-06-23 MED ORDER — PANTOPRAZOLE SODIUM 40 MG PO TBEC
40.0000 mg | DELAYED_RELEASE_TABLET | Freq: Every day | ORAL | Status: DC
  Administered 2017-06-24 – 2017-06-27 (×4): 40 mg via ORAL
  Filled 2017-06-23 (×4): qty 1

## 2017-06-23 MED ORDER — ACETAMINOPHEN 650 MG RE SUPP: 650.0000 mg | Freq: Four times a day (QID) | RECTAL | Status: DC | PRN

## 2017-06-23 MED ORDER — ATROPINE SULFATE 1 MG/ML IJ SOLN
0.2000 mg | INTRAMUSCULAR | Status: DC | PRN
  Filled 2017-06-23: qty 0.2
  Filled 2017-06-23: qty 1

## 2017-06-23 MED ORDER — BUPROPION HCL 75 MG PO TABS
75.0000 mg | ORAL_TABLET | Freq: Every day | ORAL | Status: DC
  Administered 2017-06-23 – 2017-06-26 (×4): 75 mg via ORAL
  Filled 2017-06-23 (×4): qty 1

## 2017-06-23 MED ORDER — RIVAROXABAN 10 MG PO TABS
10.0000 mg | ORAL_TABLET | Freq: Every evening | ORAL | Status: DC
  Administered 2017-06-24 – 2017-06-26 (×4): 10 mg via ORAL
  Filled 2017-06-23 (×6): qty 1

## 2017-06-23 MED ORDER — QUETIAPINE FUMARATE 50 MG PO TABS
50.0000 mg | ORAL_TABLET | Freq: Every morning | ORAL | Status: DC
  Administered 2017-06-24 – 2017-06-27 (×4): 50 mg via ORAL
  Filled 2017-06-23 (×4): qty 1

## 2017-06-23 MED ORDER — SODIUM CHLORIDE 0.9 % IV SOLN
250.0000 mL | INTRAVENOUS | Status: DC | PRN
Start: 2017-06-23 — End: 2017-06-25

## 2017-06-23 MED ORDER — DONEPEZIL HCL 10 MG PO TABS
10.0000 mg | ORAL_TABLET | Freq: Every day | ORAL | Status: DC
  Administered 2017-06-23 – 2017-06-26 (×4): 10 mg via ORAL
  Filled 2017-06-23 (×4): qty 1

## 2017-06-23 MED ORDER — BENAZEPRIL HCL 10 MG PO TABS
40.0000 mg | ORAL_TABLET | Freq: Every day | ORAL | Status: DC
  Administered 2017-06-24: 40 mg via ORAL
  Filled 2017-06-23: qty 1

## 2017-06-23 MED ORDER — ACETAMINOPHEN 325 MG PO TABS: 650.0000 mg | ORAL_TABLET | Freq: Four times a day (QID) | ORAL | Status: DC | PRN

## 2017-06-23 MED ORDER — TRAZODONE HCL 50 MG PO TABS: 50.0000 mg | ORAL_TABLET | Freq: Every evening | ORAL | Status: DC | PRN

## 2017-06-23 MED ORDER — BISACODYL 5 MG PO TBEC
5.0000 mg | DELAYED_RELEASE_TABLET | Freq: Every morning | ORAL | Status: DC
  Administered 2017-06-24 – 2017-06-26 (×3): 5 mg via ORAL
  Filled 2017-06-23 (×3): qty 1

## 2017-06-23 MED ORDER — SODIUM CHLORIDE 0.9% FLUSH
3.0000 mL | Freq: Two times a day (BID) | INTRAVENOUS | Status: DC
  Administered 2017-06-26 – 2017-06-27 (×2): 3 mL via INTRAVENOUS

## 2017-06-23 MED ORDER — SODIUM CHLORIDE 0.9% FLUSH
3.0000 mL | Freq: Two times a day (BID) | INTRAVENOUS | Status: DC
  Administered 2017-06-24 – 2017-06-25 (×2): 3 mL via INTRAVENOUS

## 2017-06-23 NOTE — ED Triage Notes (Signed)
Pt arrives via EMS from home with complaints of a vomiting episode followed by a syncopal episode. EMS reports HR ranging from 30s-60s and family states that her HR is normally in 51s. Pt hx of dementia and is bedbound.

## 2017-06-23 NOTE — Consult Note (Signed)
Cardiology Consultation:   Patient ID: JAKAYA JACOBOWITZ; [id number]; Aug 28, 1928   Admit date: 06/23/2017 Date of Consult: 06/23/2017  Primary Care Provider: Biagio Borg, MD Primary Cardiologist: n/a Primary Electrophysiologist:  n/a   Patient Profile:   Heather [name] is a 84 y.o. female with a hx of dementia who is being seen today for the evaluation of syncope and bradycardia at the request of Dr Sherry Ruffing.  History of Present Illness:   Heather [name] 84 yo female history of dementia and is bedbound, HTN, HL, prior CVA, DVT on xarelto according to notes presented with syncope. Noted to be bradycardia in ER to 40s, by ER verbal report had some rates to 20s during initial EMS evaluation. Of note she is on coreg 25mg  bid at home  Family reports 3 episodes of syncope over the las 6 months. Episode today occurred while she was sitting in her wheel chair. Suddenly became unresponsive, no seizure like activity. Family reports unreponsive for about 5 minutes then came too and vomited. They report similar episode a few months ago while at there doctors office.   History is per family. Patient is sleeping and does not answer questions.    WBC 4.3 Hgb 11.4 plt 253 K 4 Cr 1.05 Mg 2.3 BNP 141 TSH 8.6 Trop[ neg x 2 Past Medical History:  Diagnosis Date  . ALLERGIC RHINITIS 12/09/2006  . DEGENERATIVE JOINT DISEASE, LEFT KNEE 01/23/2007  . Encephalocele ([place]) 01/23/2007  . FEVER UNSPECIFIED 01/28/2010  . GERD 12/09/2006  . GLUCOSE INTOLERANCE 01/24/2009  . GOUT 12/09/2006  . HYPERCHOLESTEROLEMIA 12/09/2006  . HYPERLIPIDEMIA 12/18/2006  . HYPERTENSION 12/09/2006  . Impaired glucose tolerance 07/26/2010  . MENOPAUSAL DISORDER 01/25/2008  . Stroke [facility])     Past Surgical History:  Procedure Laterality Date  . s/p anterior encephalocele repair and craniotomy  2003         PRN Meds: atropine  Allergies:    Allergies  Allergen Reactions  . Lipitor [Atorvastatin] Other (See Comments)    myalgia   . Codeine Other (See Comments)  . Lovastatin Other (See Comments)  . Pravastatin Sodium Other (See Comments)    Social History:   Social History   Socioeconomic History  . Marital status: Married    Spouse name: Not on file  . Number of children: 7  . Years of education: Not on file  . Highest education level: Not on file  Social Needs  . Financial resource strain: Not on file  . Food insecurity - worry: Not on file  . Food insecurity - inability: Not on file  . Transportation needs - medical: Not on file  . Transportation needs - non-medical: Not on file  Occupational History  . Occupation: Musician  Tobacco Use  . Smoking status: Never Smoker  . Smokeless tobacco: Never Used  Substance and Sexual Activity  . Alcohol use: No  . Drug use: No  . Sexual activity: No  Other Topics Concern  . Not on file  Social History Narrative  . Not on file    Family History:    Family History  Problem Relation Age of Onset  . Hypertension Other      ROS:  Not able to answer due to dementia  Physical Exam/Data:   Vitals:   06/23/17 1700 06/23/17 1715 06/23/17 1730 06/23/17 1915  BP:  132/73 130/82 132/70  Pulse: (!) 42 (!) 43 (!) 43 (!) 46  Resp: 17 15 13 16   Temp:  TempSrc:      SpO2: 100% 100% 100% 95%  Weight:      Height:       No intake or output data in the 24 hours ending 06/23/17 1950 Filed Weights   06/23/17 1627  Weight: 225 lb (102.1 kg)   Body mass index is 41.15 kg/m.  General:  Well nourished, well developed, in no acute distress HEENT: normal Lymph: no adenopathy Neck: no JVD Endocrine:  No thryomegaly Cardiac:  normal S1, S2; RRR; no murmur  Lungs:  clear to auscultation bilaterally, no wheezing, rhonchi or rales  Abd: soft, nontender, no hepatomegaly  Ext: no edema Musculoskeletal:  No deformities, BUE and BLE strength normal and equal Skin: warm and dry  Neuro:  CNs 2-12 intact, no focal abnormalities  noted    Laboratory Data:  Chemistry Recent Labs  Lab 06/23/17 1624  NA 138  K 4.0  CL 106  CO2 24  GLUCOSE 177*  BUN 19  CREATININE 1.05*  CALCIUM 8.8*  GFRNONAA 47*  GFRAA 54*  ANIONGAP 8    Recent Labs  Lab 06/23/17 1624  PROT 6.3*  ALBUMIN 2.6*  AST 19  ALT 13*  ALKPHOS 74  BILITOT 0.8   Hematology Recent Labs  Lab 06/23/17 1624  WBC 4.3  RBC 4.27  HGB 11.4*  HCT 36.9  MCV 86.4  MCH 26.7  MCHC 30.9  RDW 16.7*  PLT 253   Cardiac EnzymesNo results for input(s): TROPONINI in the last 168 hours.  Recent Labs  Lab 06/23/17 1652  TROPIPOC 0.00    BNP Recent Labs  Lab 06/23/17 1632  BNP 141.6*    DDimer No results for input(s): DDIMER in the last 168 hours.  Radiology/Studies:  Dg Chest Port 1 View  Result Date: 06/23/2017 CLINICAL DATA:  Syncope EXAM: PORTABLE CHEST 1 VIEW COMPARISON:  01/24/2016 FINDINGS: Cardiomegaly. Stable eventration of the right hemidiaphragm. Large hiatal hernia. No confluent airspace opacities or effusions. No acute bony abnormality. IMPRESSION: Cardiomegaly. Eventration of the right hemidiaphragm. Large hiatal hernia. Electronically Signed   By: Rolm Baptise M.D.   On: 06/23/2017 16:57    Assessment and Plan:   1. Syncope - in setting of severe bradycardia, rates down to mid 30s by EMS strips. By verbal report has some rates in 20s - bradycardia in setting of coreg at home, also with elevated TSH and free T4 pending - hold coreg, I do not see a strong secondary indication for his medication for her and would use alternative hypertenstive if needed. With her age and dementia there is probable more harm than benefit at aggressive bp control for her - follow rates off coreg tonight on telemetry.  - likely poor pacemaker candidate if rates don't improve   For questions or updates, please contact [place] Please consult [email or web address] for contact info under Cardiology/STEMI.   Merrily Pew, MD   06/23/2017 7:50 PM

## 2017-06-23 NOTE — H&P (Signed)
History and Physical    Heather [name] XNT:[id number] DOB: Nov 08, 1928 DOA: 06/23/2017  PCP: Biagio Borg, MD   Patient coming from: Home  Chief Complaint: Recurrent syncope   HPI: Heather [name] is a 84 y.o. female with medical history significant for hypertension, chronic kidney disease stage III, history of DVT on Xarelto, dementia, and chronic diastolic CHF, now presenting to the emergency department following a syncopal episode at home.  This is the patient's third episode in recent months.  She had reportedly been in her usual state of health and was sitting up eating when she stated she needed to lay down, grunted, and was then briefly unresponsive.  She recovered quickly.  EMS was called out and she was found to have heart rate in the 30s, dipping down into the 20s.  This was a sinus rhythm on the strip.  There was no recent fall or trauma, no chest pain or palpitations, and no headaches, change in vision or hearing, or focal numbness or weakness.  Patient vomited after regaining consciousness.  Family reports that her heart rate is usually in the 40s.  Of note, she takes Coreg 25 mg twice daily.  ED Course: Upon arrival to the ED, patient is found to be afebrile, saturating well on room air, bradycardic in the 40s, and with normal blood pressure.  EKG features a sinus bradycardia with rate 43 and first-degree AV nodal block.  Chest x-ray is notable for cardiomegaly with large hiatal hernia.  Chemistry panel features a creatinine of 1.05, lower than priors.  CBC is unremarkable.  BNP is slightly elevated and troponin is undetectable.  TSH is elevated to 8.644 with normal T4.  Cardiology was consulted by the ED physician and recommends a medical admission, stopping Coreg, and continuing cardiac monitoring.  Patient remains bradycardic with stable blood pressure and no further syncopal episode in the ED.  She will be admitted to the stepdown unit for ongoing evaluation and management of syncope,  suspected cardiogenic, likely related to Coreg.  Review of Systems:  All other systems reviewed and apart from HPI, are negative.  Past Medical History:  Diagnosis Date  . ALLERGIC RHINITIS 12/09/2006  . DEGENERATIVE JOINT DISEASE, LEFT KNEE 01/23/2007  . Encephalocele ([place]) 01/23/2007  . FEVER UNSPECIFIED 01/28/2010  . GERD 12/09/2006  . GLUCOSE INTOLERANCE 01/24/2009  . GOUT 12/09/2006  . HYPERCHOLESTEROLEMIA 12/09/2006  . HYPERLIPIDEMIA 12/18/2006  . HYPERTENSION 12/09/2006  . Impaired glucose tolerance 07/26/2010  . MENOPAUSAL DISORDER 01/25/2008  . Stroke [facility])     Past Surgical History:  Procedure Laterality Date  . s/p anterior encephalocele repair and craniotomy  2003     reports that  has never smoked. she has never used smokeless tobacco. She reports that she does not drink alcohol or use drugs.  Allergies  Allergen Reactions  . Lipitor [Atorvastatin] Other (See Comments)    myalgia  . Codeine Other (See Comments)  . Lovastatin Other (See Comments)  . Pravastatin Sodium Other (See Comments)    Family History  Problem Relation Age of Onset  . Hypertension Other      Prior to Admission medications   Medication Sig Start Date End Date Taking? Authorizing Provider  acetaminophen (TYLENOL) 500 MG tablet Take 500 mg by mouth every 6 (six) hours as needed for moderate pain (knee, leg, stomach).   Yes [provider]  amLODipine (NORVASC) 5 MG tablet Take 1 tablet (5 mg total) by mouth daily. Patient taking differently: Take 5 mg  by mouth at bedtime.  12/17/16  Yes Biagio Borg, MD  benazepril (LOTENSIN) 40 MG tablet TAKE ONE TABLET BY MOUTH ONCE DAILY 02/01/17  Yes Biagio Borg, MD  Bisacodyl (LAXATIVE PO) Take 1 tablet by mouth daily.   Yes [provider]  buPROPion [facility]) 75 MG tablet Take 1 tablet at bedtime 03/10/17  Yes Cameron Sprang, MD  carvedilol (COREG) 25 MG tablet TAKE ONE TABLET BY MOUTH TWICE DAILY WITH MEALS 06/04/17  Yes Biagio Borg, MD  donepezil (ARICEPT) 10 MG tablet Take 1 tablet daily 03/10/17  Yes Cameron Sprang, MD  nystatin (MYCOSTATIN/NYSTOP) powder Use as directed twice per day as needed Patient taking differently: Use as directed twice per day as needed for itching 10/07/16  Yes Biagio Borg, MD  pantoprazole (PROTONIX) 40 MG tablet TAKE 1 TABLET BY MOUTH  DAILY 06/04/17  Yes Plotnikov, Evie Lacks, MD  QUEtiapine (SEROQUEL) 50 MG tablet 1 tab by mouth in the AM 09/25/16  Yes Biagio Borg, MD  rivaroxaban (XARELTO) 10 MG TABS tablet Take 1 tablet (10 mg total) daily by mouth. 02/16/17  Yes Plotnikov, Evie Lacks, MD  traMADol (ULTRAM) 50 MG tablet TAKE 1 TABLET BY MOUTH EVERY 8 HOURS AS NEEDED Patient taking differently: Take one tablet by mouth every 8 hours as needed for pain. 06/10/17  Yes Biagio Borg, MD  traZODone (DESYREL) 50 MG tablet TAKE 1/2 TO 1 (ONE-HALF TO ONE) TABLET BY MOUTH AT BEDTIME AS NEEDED FOR SLEEP 04/26/17  Yes Biagio Borg, MD  furosemide (LASIX) 40 MG tablet TAKE ONE TABLET BY MOUTH ONCE DAILY Patient not taking: Reported on 06/23/2017 02/06/17   Biagio Borg, MD  ondansetron (ZOFRAN) 4 MG tablet Take 1 tablet (4 mg total) every 8 (eight) hours as needed by mouth for nausea or vomiting. Patient not taking: Reported on 06/23/2017 02/16/17   Plotnikov, Evie Lacks, MD    Physical Exam: Vitals:   06/23/17 1730 06/23/17 1915 06/23/17 2000 06/23/17 2100  BP: 130/82 132/70 140/84 126/63  Pulse: (!) 43 (!) 46 (!) 42 (!) 47  Resp: 13 16 13 13   Temp:      TempSrc:      SpO2: 100% 95% 100% 100%  Weight:      Height:          Constitutional: NAD, calm  Eyes: PERTLA, lids and conjunctivae normal ENMT: Mucous membranes are moist. Posterior pharynx clear of any exudate or lesions.   Neck: normal, supple, no masses, no thyromegaly Respiratory: clear to auscultation bilaterally, no wheezing, no crackles. Normal respiratory effort.    Cardiovascular: Rate ~45 and regular. No significant  JVD. Abdomen: No distension, no tenderness, no masses palpated. Bowel sounds normal.  Musculoskeletal: no clubbing / cyanosis. No joint deformity upper and lower extremities.    Skin: no significant rashes, lesions, ulcers. Warm, dry, well-perfused. Neurologic: No gross facial asymmetry. Sensation to light touch intact. Moving all extremities.  Psychiatric:  Alert and oriented to person and place, not oriented to month or year. Calm, cooperative.     Labs on Admission: I have personally reviewed following labs and imaging studies  CBC: Recent Labs  Lab 06/23/17 1624  WBC 4.3  NEUTROABS 1.7  HGB 11.4*  HCT 36.9  MCV 86.4  PLT 846   Basic Metabolic Panel: Recent Labs  Lab 06/23/17 1624  NA 138  K 4.0  CL 106  CO2 24  GLUCOSE 177*  BUN 19  CREATININE  1.05*  CALCIUM 8.8*  MG 2.3   GFR: Estimated Creatinine Clearance: 43 mL/min (A) (by C-G formula based on SCr of 1.05 mg/dL (H)). Liver Function Tests: Recent Labs  Lab 06/23/17 1624  AST 19  ALT 13*  ALKPHOS 74  BILITOT 0.8  PROT 6.3*  ALBUMIN 2.6*   No results for input(s): LIPASE, AMYLASE in the last 168 hours. No results for input(s): AMMONIA in the last 168 hours. Coagulation Profile: No results for input(s): INR, PROTIME in the last 168 hours. Cardiac Enzymes: No results for input(s): CKTOTAL, CKMB, CKMBINDEX, TROPONINI in the last 168 hours. BNP (last 3 results) No results for input(s): PROBNP in the last 8760 hours. HbA1C: No results for input(s): HGBA1C in the last 72 hours. CBG: Recent Labs  Lab 06/23/17 1629  GLUCAP 158*   Lipid Profile: No results for input(s): CHOL, HDL, LDLCALC, TRIG, CHOLHDL, LDLDIRECT in the last 72 hours. Thyroid Function Tests: Recent Labs    06/23/17 1714 06/23/17 1939  TSH 8.644*  --   FREET4  --  0.84   Anemia Panel: No results for input(s): VITAMINB12, FOLATE, FERRITIN, TIBC, IRON, RETICCTPCT in the last 72 hours. Urine analysis:    Component Value  Date/Time   COLORURINE YELLOW 10/11/2016 1538   APPEARANCEUR CLEAR 10/11/2016 1538   LABSPEC 1.011 10/11/2016 1538   PHURINE 5.0 10/11/2016 1538   GLUCOSEU NEGATIVE 10/11/2016 1538   GLUCOSEU NEGATIVE 03/17/2016 1727   HGBUR NEGATIVE 10/11/2016 1538   BILIRUBINUR NEGATIVE 10/11/2016 1538   KETONESUR NEGATIVE 10/11/2016 1538   PROTEINUR NEGATIVE 10/11/2016 1538   UROBILINOGEN 0.2 03/17/2016 1727   NITRITE NEGATIVE 10/11/2016 1538   LEUKOCYTESUR NEGATIVE 10/11/2016 1538   Sepsis Labs: @LABRCNTIP (procalcitonin:4,lacticidven:4) )No results found for this or any previous visit (from the past 240 hour(s)).   Radiological Exams on Admission: Dg Chest Port 1 View  Result Date: 06/23/2017 CLINICAL DATA:  Syncope EXAM: PORTABLE CHEST 1 VIEW COMPARISON:  01/24/2016 FINDINGS: Cardiomegaly. Stable eventration of the right hemidiaphragm. Large hiatal hernia. No confluent airspace opacities or effusions. No acute bony abnormality. IMPRESSION: Cardiomegaly. Eventration of the right hemidiaphragm. Large hiatal hernia. Electronically Signed   By: Rolm Baptise M.D.   On: 06/23/2017 16:57    EKG: Independently reviewed. Sinus bradycardia (rate 43), 1st degree AVB.   Assessment/Plan  1. Syncope  - Presents following a syncopal episode, the patient's third in recent months  - Preceded by acute lightheadedness and followed by vomiting  - Noted by EMS to have HR in 30's, dipping into 20's prior to arrival with a sinus rhythm on strip  - Likely related to bradycardia and secondary to Coreg as discussed below; associated N/V could suggestive possible vagal etiology  - Continue cardiac monitoring, stop Coreg, check orthostatic vitals, echocardiogram    2. Sinus bradycardia  - HR in 40's in ED, had been in 30's and dipping into 20's with EMS  - EKG features sinus bradycardia with rate 43 and 1st degree AV block  - Likely secondary to Coreg, will stop per cardiology recommendation  - Continue cardiac  monitoring    3. Subclinical hypothyroidism  - TSH elevated to 8.6 with normal T4  - No signs of overt hypothyroidism, suspect bradycardia is medication-related    4. Dementia  - Stable, answering basic questions appropriately in ED  - Continue Aricept, continue Seroquel for associated behavior disturbance    5. CKD stage III  - SCr 1.05 on admission, lower than priors  - Renally-dose medications, avoid nephrotoxins  6. Hypertension  - BP at goal  - Continue Norvasc and benazepril, stop Coreg   7. Hx of DVT  - No evidence for acute VTE - Continue Xarelto     DVT prophylaxis: Xarelto  Code Status: Full  Family Communication: Daughter updated at bedside Consults called: Cardiology  Admission status: Inpatient    Vianne Bulls, MD [facility] Pager [phone]  If 7PM-7AM, please contact night-coverage [email or web address] Password [facility]  06/23/2017, 10:02 PM

## 2017-06-23 NOTE — ED Notes (Signed)
Admitting at bedside-Monique,RN  

## 2017-06-23 NOTE — ED Provider Notes (Signed)
[facility] EMERGENCY DEPARTMENT Provider Note   CSN: [id number] Arrival date & time: 06/23/17  1620     History   Chief Complaint Chief Complaint  Patient presents with  . Loss of Consciousness    HPI Heather [name] is a 84 y.o. female.  The history is provided by the patient and the EMS personnel.  Loss of Consciousness   This is a recurrent problem. The current episode started less than 1 hour ago. The problem has been resolved. She lost consciousness for a period of 1 to 5 minutes. Associated symptoms include confusion (baseline dementia), nausea and vomiting. Pertinent negatives include abdominal pain, chest pain, fever, focal sensory loss, focal weakness, headaches and weakness. Her past medical history is significant for CVA and HTN.  -had 1 vomiting episode after getting sat up by fire department  Past Medical History:  Diagnosis Date  . ALLERGIC RHINITIS 12/09/2006  . DEGENERATIVE JOINT DISEASE, LEFT KNEE 01/23/2007  . Encephalocele ([place]) 01/23/2007  . FEVER UNSPECIFIED 01/28/2010  . GERD 12/09/2006  . GLUCOSE INTOLERANCE 01/24/2009  . GOUT 12/09/2006  . HYPERCHOLESTEROLEMIA 12/09/2006  . HYPERLIPIDEMIA 12/18/2006  . HYPERTENSION 12/09/2006  . Impaired glucose tolerance 07/26/2010  . MENOPAUSAL DISORDER 01/25/2008  . Stroke [facility])     Patient Active Problem List   Diagnosis Date Noted  . Syncope, cardiogenic 06/23/2017  . Sinus bradycardia 06/23/2017  . History of completed stroke 06/23/2017  . Nausea without vomiting 02/16/2017  . Facial droop   . Transient alteration of awareness   . Acute metabolic encephalopathy 01/75/1025  . Seizure ([place]) 01/25/2017  . Adhesive capsulitis of left shoulder 01/02/2017  . Acute bilateral low back pain without sciatica 01/02/2017  . Gait disorder 12/17/2016  . Do not resuscitate discussion 12/17/2016  . Right shoulder pain 09/25/2016  . Insomnia 08/16/2016  . Dysuria 08/16/2016  . Chronic hypoxemic  respiratory failure ([place]) 06/12/2016  . Nocturia 03/17/2016  . Mild dementia 03/09/2016  . Stroke syndrome 03/09/2016  . CKD (chronic kidney disease), stage III ([place]) 02/05/2016  . Rash 02/05/2016  . Personal history of DVT (deep vein thrombosis) 01/28/2016  . Diastolic CHF, chronic ([place]) 01/28/2016  . Acute kidney injury ([place])   . Pulmonary embolus ([place])   . Acute respiratory failure with hypoxia ([place]) 01/24/2016  . Dementia with behavioral disturbance 01/01/2016  . Non compliance w medication regimen 01/01/2016  . General weakness 01/01/2016  . Peripheral edema 01/01/2016  . Chronic venous insufficiency 08/01/2014  . Fall at home 08/01/2013  . Osteopenia 08/01/2013  . Left knee pain 01/29/2011  . Encounter for well adult exam with abnormal findings 07/26/2010  . Impaired glucose tolerance 07/26/2010  . MENOPAUSAL DISORDER 01/25/2008  . Osteoarthrosis involving lower leg 01/23/2007  . Encephalocele ([place]) 01/23/2007  . Hyperlipidemia 12/18/2006  . GOUT 12/09/2006  . Essential hypertension 12/09/2006  . ALLERGIC RHINITIS 12/09/2006  . GERD 12/09/2006    Past Surgical History:  Procedure Laterality Date  . s/p anterior encephalocele repair and craniotomy  2003    OB History    No data available       Home Medications    Prior to Admission medications   Medication Sig Start Date End Date Taking? Authorizing Provider  acetaminophen (TYLENOL) 500 MG tablet Take 500 mg by mouth every 6 (six) hours as needed for moderate pain (knee, leg, stomach).   Yes [provider]  amLODipine (NORVASC) 5 MG tablet Take 1 tablet (5 mg total) by mouth daily. Patient  taking differently: Take 5 mg by mouth at bedtime.  12/17/16  Yes Biagio Borg, MD  benazepril (LOTENSIN) 40 MG tablet TAKE ONE TABLET BY MOUTH ONCE DAILY 02/01/17  Yes Biagio Borg, MD  Bisacodyl (LAXATIVE PO) Take 1 tablet by mouth daily.   Yes [provider]  buPROPion [facility]) 75 MG tablet Take 1  tablet at bedtime 03/10/17  Yes Cameron Sprang, MD  carvedilol (COREG) 25 MG tablet TAKE ONE TABLET BY MOUTH TWICE DAILY WITH MEALS 06/04/17  Yes Biagio Borg, MD  donepezil (ARICEPT) 10 MG tablet Take 1 tablet daily 03/10/17  Yes Cameron Sprang, MD  nystatin (MYCOSTATIN/NYSTOP) powder Use as directed twice per day as needed Patient taking differently: Use as directed twice per day as needed for itching 10/07/16  Yes Biagio Borg, MD  pantoprazole (PROTONIX) 40 MG tablet TAKE 1 TABLET BY MOUTH  DAILY 06/04/17  Yes Plotnikov, Evie Lacks, MD  QUEtiapine (SEROQUEL) 50 MG tablet 1 tab by mouth in the AM 09/25/16  Yes Biagio Borg, MD  rivaroxaban (XARELTO) 10 MG TABS tablet Take 1 tablet (10 mg total) daily by mouth. 02/16/17  Yes Plotnikov, Evie Lacks, MD  traMADol (ULTRAM) 50 MG tablet TAKE 1 TABLET BY MOUTH EVERY 8 HOURS AS NEEDED Patient taking differently: Take one tablet by mouth every 8 hours as needed for pain. 06/10/17  Yes Biagio Borg, MD  traZODone (DESYREL) 50 MG tablet TAKE 1/2 TO 1 (ONE-HALF TO ONE) TABLET BY MOUTH AT BEDTIME AS NEEDED FOR SLEEP 04/26/17  Yes Biagio Borg, MD  carvedilol (COREG) 25 MG tablet TAKE ONE TABLET BY MOUTH TWICE DAILY WITH MEALS Patient not taking: Reported on 06/23/2017 03/08/17   Biagio Borg, MD  furosemide (LASIX) 40 MG tablet TAKE ONE TABLET BY MOUTH ONCE DAILY Patient not taking: Reported on 06/23/2017 02/06/17   Biagio Borg, MD  ondansetron (ZOFRAN) 4 MG tablet Take 1 tablet (4 mg total) every 8 (eight) hours as needed by mouth for nausea or vomiting. Patient not taking: Reported on 06/23/2017 02/16/17   Plotnikov, Evie Lacks, MD    Family History Family History  Problem Relation Age of Onset  . Hypertension Other     Social History Social History   Tobacco Use  . Smoking status: Never Smoker  . Smokeless tobacco: Never Used  Substance Use Topics  . Alcohol use: No  . Drug use: No     Allergies   Lipitor [atorvastatin]; Codeine;  Lovastatin; and Pravastatin sodium   Review of Systems Review of Systems  Unable to perform ROS: Dementia  Constitutional: Negative for fever.  Respiratory: Positive for shortness of breath. Negative for cough.   Cardiovascular: Positive for leg swelling and syncope. Negative for chest pain.  Gastrointestinal: Positive for nausea and vomiting. Negative for abdominal pain.  Genitourinary: Negative for dysuria.  Skin: Negative for rash.  Neurological: Negative for focal weakness, weakness, numbness and headaches.  Psychiatric/Behavioral: Positive for confusion (baseline dementia).  All other systems reviewed and are negative.    Physical Exam Updated Vital Signs BP 126/63   Pulse (!) 47   Temp (!) 97.4 F (36.3 C) (Oral)   Resp 13   Ht 5\' 2"  (1.575 m)   Wt 102.1 kg (225 lb)   SpO2 100%   BMI 41.15 kg/m   Physical Exam  Constitutional: She appears well-developed and well-nourished. No distress.  HENT:  Head: Normocephalic and atraumatic.  Eyes: Conjunctivae and EOM are normal.  Pupils are equal, round, and reactive to light.  Neck: Neck supple.  Cardiovascular: Regular rhythm. Bradycardia present.  No murmur heard. Pulmonary/Chest: Effort normal. No respiratory distress. She has rales in the right lower field and the left lower field.  Abdominal: Soft. There is no tenderness.  Musculoskeletal: She exhibits no edema.  Neurological: She is alert.  Skin: Skin is warm and dry.  Psychiatric: She has a normal mood and affect.  Nursing note and vitals reviewed.    ED Treatments / Results  Labs (all labs ordered are listed, but only abnormal results are displayed) Labs Reviewed  CBC WITH DIFFERENTIAL/PLATELET - Abnormal; Notable for the following components:      Result Value   Hemoglobin 11.4 (*)    RDW 16.7 (*)    All other components within normal limits  COMPREHENSIVE METABOLIC PANEL - Abnormal; Notable for the following components:   Glucose, Bld 177 (*)     Creatinine, Ser 1.05 (*)    Calcium 8.8 (*)    Total Protein 6.3 (*)    Albumin 2.6 (*)    ALT 13 (*)    GFR calc non Af Amer 47 (*)    GFR calc Af Amer 54 (*)    All other components within normal limits  BRAIN NATRIURETIC PEPTIDE - Abnormal; Notable for the following components:   B Natriuretic Peptide 141.6 (*)    All other components within normal limits  TSH - Abnormal; Notable for the following components:   TSH 8.644 (*)    All other components within normal limits  CBG MONITORING, ED - Abnormal; Notable for the following components:   Glucose-Capillary 158 (*)    All other components within normal limits  MAGNESIUM  T4, FREE  URINALYSIS, ROUTINE W REFLEX MICROSCOPIC  CBG MONITORING, ED  I-STAT TROPONIN, ED  I-STAT TROPONIN, ED    EKG  EKG Interpretation  Date/Time:  Wednesday June 23 2017 16:22:05 EDT Ventricular Rate:  43 PR Interval:    QRS Duration: 103 QT Interval:  562 QTC Calculation: 476 R Axis:   8 Text Interpretation:  Sinus bradycardia Prolonged PR interval Anterior infarct, old When compared to prior, slower rate.  No STEMI Confirmed by Antony Blackbird [phone]) on 06/23/2017 4:34:11 PM       Radiology Dg Chest Port 1 View  Result Date: 06/23/2017 CLINICAL DATA:  Syncope EXAM: PORTABLE CHEST 1 VIEW COMPARISON:  01/24/2016 FINDINGS: Cardiomegaly. Stable eventration of the right hemidiaphragm. Large hiatal hernia. No confluent airspace opacities or effusions. No acute bony abnormality. IMPRESSION: Cardiomegaly. Eventration of the right hemidiaphragm. Large hiatal hernia. Electronically Signed   By: Rolm Baptise M.D.   On: 06/23/2017 16:57    Procedures Procedures (including critical care time)  Medications Ordered in ED Medications  atropine injection 0.2 mg (not administered)  ondansetron (ZOFRAN) injection 4 mg (4 mg Intravenous Given 06/23/17 1709)     Initial Impression / Assessment and Plan / ED Course  I have reviewed the triage vital signs  and the nursing notes.  Pertinent labs & imaging results that were available during my care of the patient were reviewed by me and considered in my medical decision making (see chart for details).    Patient is a 84 year old female with history of dementia, gout, hyperlipidemia, hypertension, 2 prior strokes, encephalocele who presents with an episode of syncope.  Patient has had 2 prior syncopal events in the last few months that was similar to this.  Patient has not been sick recently  per family.  She has been more off of her sleep schedule and taking intermittent naps and has been more sleepy the past 36 hours.  Patient complains of nausea but has no other complaints.  On exam patient is pleasantly demented, appears sleepy.  She is bradycardic on exam.  She has trace crackles in the bases.  Trace lower extremity edema.  Extremities feel slightly cool.  Workup obtained as above which shows stable hemoglobin, unremarkable metabolic panel, magnesium, BNP, troponins.  TSH was elevated, however the free T4 was normal.  EKG shows sinus bradycardia with a first-degree AV block and a QTC of 476.  No signs of second-degree or third-degree heart block.  EMS rhythm strip reviewed which did show a heart rate in the 30s with a long sinus pause.  Cardiology consulted given the symptomatic bradycardia.  She normally runs with a heart rate in the 40s but EMS reported a heart rate as low as 20s.  Patient currently back to her baseline mental status.  Patient given Zofran for nausea and vomiting and this relieved her symptoms.  She has no belly tenderness.  Doubt any acute intra-abdominal pathology.  This is likely related to the syncope.  Cardiology recommends holding her carvedilol which she currently takes 25 mg twice daily and will likely need medication adjustments.  Patient will be admitted to the hospitalist with the cardiology team following.   Final Clinical Impressions(s) / ED Diagnoses   Final  diagnoses:  Syncope, unspecified syncope type  Symptomatic bradycardia    ED Discharge Orders    None       Tobie Poet, DO 06/23/17 2124    Tegeler, Gwenyth Allegra, MD 06/24/17 [phone]

## 2017-06-24 ENCOUNTER — Inpatient Hospital Stay (HOSPITAL_COMMUNITY): Payer: Medicare Other

## 2017-06-24 DIAGNOSIS — N183 Chronic kidney disease, stage 3 (moderate): Secondary | ICD-10-CM

## 2017-06-24 DIAGNOSIS — I5032 Chronic diastolic (congestive) heart failure: Secondary | ICD-10-CM

## 2017-06-24 DIAGNOSIS — I351 Nonrheumatic aortic (valve) insufficiency: Secondary | ICD-10-CM

## 2017-06-24 DIAGNOSIS — Z86718 Personal history of other venous thrombosis and embolism: Secondary | ICD-10-CM

## 2017-06-24 DIAGNOSIS — T50905A Adverse effect of unspecified drugs, medicaments and biological substances, initial encounter: Secondary | ICD-10-CM

## 2017-06-24 DIAGNOSIS — R001 Bradycardia, unspecified: Principal | ICD-10-CM

## 2017-06-24 LAB — URINALYSIS, ROUTINE W REFLEX MICROSCOPIC
Bilirubin Urine: NEGATIVE
Glucose, UA: NEGATIVE mg/dL
Hgb urine dipstick: NEGATIVE
Ketones, ur: NEGATIVE mg/dL
Leukocytes, UA: NEGATIVE
Nitrite: NEGATIVE
Protein, ur: NEGATIVE mg/dL
Specific Gravity, Urine: 1.017 (ref 1.005–1.030)
pH: 5 (ref 5.0–8.0)

## 2017-06-24 LAB — MRSA PCR SCREENING: MRSA by PCR: NEGATIVE

## 2017-06-24 LAB — BASIC METABOLIC PANEL
Anion gap: 9 (ref 5–15)
BUN: 18 mg/dL (ref 6–20)
CO2: 23 mmol/L (ref 22–32)
Calcium: 8.7 mg/dL — ABNORMAL LOW (ref 8.9–10.3)
Chloride: 105 mmol/L (ref 101–111)
Creatinine, Ser: 0.87 mg/dL (ref 0.44–1.00)
GFR calc Af Amer: >60 mL/min (ref >60)
GFR calc non Af Amer: 59 mL/min — ABNORMAL LOW (ref >60)
Glucose, Bld: 102 mg/dL — ABNORMAL HIGH (ref 65–99)
Potassium: 4.9 mmol/L (ref 3.5–5.1)
Sodium: 137 mmol/L (ref 135–145)

## 2017-06-24 LAB — CBC
HCT: 39.3 % (ref 36.0–46.0)
Hemoglobin: 12.1 g/dL (ref 12.0–15.0)
MCH: 26.5 pg (ref 26.0–34.0)
MCHC: 30.8 g/dL (ref 30.0–36.0)
MCV: 86.2 fL (ref 78.0–100.0)
Platelets: 229 10*3/uL (ref 150–400)
RBC: 4.56 MIL/uL (ref 3.87–5.11)
RDW: 16.6 % — ABNORMAL HIGH (ref 11.5–15.5)
WBC: 4.6 10*3/uL (ref 4.0–10.5)

## 2017-06-24 LAB — GLUCOSE, CAPILLARY: Glucose-Capillary: 118 mg/dL — ABNORMAL HIGH (ref 65–99)

## 2017-06-24 LAB — ECHOCARDIOGRAM COMPLETE
Height: 62 in
Weight: 3600 oz

## 2017-06-24 MED ORDER — LEVOTHYROXINE SODIUM 25 MCG PO TABS
12.5000 ug | ORAL_TABLET | Freq: Every day | ORAL | Status: DC
  Administered 2017-06-24 – 2017-06-27 (×4): 12.5 ug via ORAL
  Filled 2017-06-24 (×4): qty 1

## 2017-06-24 MED ORDER — ATROPINE SULFATE 1 MG/10ML IJ SOSY
PREFILLED_SYRINGE | INTRAMUSCULAR | Status: AC
  Filled 2017-06-24: qty 10

## 2017-06-24 MED ORDER — ATROPINE SULFATE 1 MG/ML IJ SOLN
0.2000 mg | INTRAMUSCULAR | Status: DC | PRN
  Filled 2017-06-24: qty 0.2

## 2017-06-24 MED ORDER — SODIUM CHLORIDE 0.9 % IV BOLUS (SEPSIS): 500.0000 mL | Freq: Once | INTRAVENOUS | Status: DC

## 2017-06-24 MED ORDER — SODIUM CHLORIDE 0.9 % IV SOLN
INTRAVENOUS | Status: DC
  Administered 2017-06-24 – 2017-06-25 (×3): via INTRAVENOUS

## 2017-06-24 NOTE — Progress Notes (Addendum)
Progress Note  Patient Name: Heather [name] Date of Encounter: 06/24/2017  Primary Cardiologist: New (will be Dr. Sallyanne Kuster as Dr. Harl Bowie practices in [place])  Subjective   No complaints today. No CP, SOB, dizziness.  Inpatient Medications    Scheduled Meds: . amLODipine  5 mg Oral QHS  . benazepril  40 mg Oral Daily  . bisacodyl  5 mg Oral q morning - 10a  . buPROPion  75 mg Oral QHS  . donepezil  10 mg Oral QHS  . pantoprazole  40 mg Oral Daily  . QUEtiapine  50 mg Oral q morning - 10a  . rivaroxaban  10 mg Oral QPM  . sodium chloride flush  3 mL Intravenous Q12H  . sodium chloride flush  3 mL Intravenous Q12H   Continuous Infusions: . sodium chloride     PRN Meds: sodium chloride, acetaminophen **OR** acetaminophen, atropine, sodium chloride flush, traMADol, traZODone   Vital Signs    Vitals:   06/24/17 0700 06/24/17 0800 06/24/17 1005 06/24/17 1040  BP: (!) 143/76 128/61    Pulse: (!) 53 (!) 52 (!) 51 98  Resp: 17 18 (!) 24 20  Temp:      TempSrc:      SpO2: 100% 100% 100% 99%  Weight:      Height:       No intake or output data in the 24 hours ending 06/24/17 1103 Filed Weights   06/23/17 1627  Weight: 225 lb (102.1 kg)    Telemetry    Sinus bradycardia HR upper 40s-50s - Personally Reviewed  Physical Exam   GEN: No acute distress, obese  HEENT: Normocephalic, atraumatic, sclera non-icteric. Neck: No JVD or bruits. Cardiac: Bradycardic, regular, no murmurs, rubs, or gallops.  Radials/DP/PT 1+ and equal bilaterally.  Respiratory: Clear to auscultation bilaterally. Breathing is unlabored. GI: Soft, nontender, non-distended, BS +x 4. MS: no deformity. Extremities: No clubbing or cyanosis. No edema. Distal pedal pulses are 2+ and equal bilaterally. Neuro:  A+O to self, place - not time. Follows commands. Psych:  Responds to questions appropriately with a normal affect.  Labs    Chemistry Recent Labs  Lab 06/23/17 1624 06/24/17 0330  NA 138  137  K 4.0 4.9  CL 106 105  CO2 24 23  GLUCOSE 177* 102*  BUN 19 18  CREATININE 1.05* 0.87  CALCIUM 8.8* 8.7*  PROT 6.3*  --   ALBUMIN 2.6*  --   AST 19  --   ALT 13*  --   ALKPHOS 74  --   BILITOT 0.8  --   GFRNONAA 47* 59*  GFRAA 54* >60  ANIONGAP 8 9     Hematology Recent Labs  Lab 06/23/17 1624 06/24/17 0330  WBC 4.3 4.6  RBC 4.27 4.56  HGB 11.4* 12.1  HCT 36.9 39.3  MCV 86.4 86.2  MCH 26.7 26.5  MCHC 30.9 30.8  RDW 16.7* 16.6*  PLT 253 229    Cardiac EnzymesNo results for input(s): TROPONINI in the last 168 hours.  Recent Labs  Lab 06/23/17 1652 06/23/17 1939  TROPIPOC 0.00 0.00     BNP Recent Labs  Lab 06/23/17 1632  BNP 141.6*     DDimer No results for input(s): DDIMER in the last 168 hours.   Radiology    Dg Chest Port 1 View  Result Date: 06/23/2017 CLINICAL DATA:  Syncope EXAM: PORTABLE CHEST 1 VIEW COMPARISON:  01/24/2016 FINDINGS: Cardiomegaly. Stable eventration of the right hemidiaphragm. Large hiatal hernia. No  confluent airspace opacities or effusions. No acute bony abnormality. IMPRESSION: Cardiomegaly. Eventration of the right hemidiaphragm. Large hiatal hernia. Electronically Signed   By: Rolm Baptise M.D.   On: 06/23/2017 16:57    Cardiac Studies   2d echo pending.  Patient Profile     84 y.o. female with dementia, HTN, HL, prior CVA, DVT on xarelto admitted with syncope, found to have bradycardia. On carvedilol 25mg  BID at home. Lives at home with daughter who cares for her, only able to move about with [facility] lift, spend some time in wheelchair - no longer ambulatory.  Assessment & Plan    1. Syncope/bradycardia - rates reported at 20-30s, likely causing syncope. She was on max dose carvedilol as outpatient which has been stopped. HR is in the upper 40s-low 50s. Would hope to avoid pacemaker given her dementia and poor mobility/bedbound status. For now there does not seem to be an acute indication. Continue observation while  beta blocker washes out. Patient has chronic HR in the 40s per notes. Also recommend attention to thyroid abnormality per IM.  2. HTN - will likely need BP sub; would avoid AVN blocking agents in the future.  3. Possible hypothyroidism - abnormal TSH but normal FT4, ? Subclinical hypothyroidism. Further per IM.  For questions or updates, please contact [place] Please consult [email or web address] for contact info under Cardiology/STEMI.  Signed, Charlie Pitter, PA-C 06/24/2017, 11:03 AM    I have seen and examined the patient along with Charlie Pitter, PA-C.  I have reviewed the chart, notes and new data.  I agree with PA's note.  Key new complaints: After transferring to the unit from the emergency room she briefly developed severe bradycardia with rates down in the 20s, maintaining sinus beats and narrow QRS complex.  As the nursing staff was preparing to administer atropine her heart rate spontaneously returned to the low 40s.  She experienced severe shortness of breath and near syncope when she was most bradycardic.  At this point she is essentially asymptomatic with a heart rate around 45 bpm Key examination changes: Morbidly obese, bradycardia, exam is very limited.  No other obvious cardiovascular abnormalities. Key new findings / data: TSH is mildly elevated, although free T4 is normal, subclinical hypothyroidism may be potentiating her bradycardia. ECG shows both sinus bradycardia and mildly prolonged PR interval, otherwise normal.  There is no evidence of high-grade AV block on the telemetry, only sinus bradycardia.  PLAN:  Carvedilol stopped and will not be restarted.  Watch for beta-blocker rebound hypertension. We will replace with antihypertensives without negative chronotropic effect. Atropine is expected to work for new episodes of profound and symptomatic bradycardia, but I suspect will not be necessary.  Similarly, I do not think she will require a pacemaker once the beta-blocker  effect resolves.   Sanda Klein, MD, [place] [phone] 06/24/2017, 3:48 PM

## 2017-06-24 NOTE — Progress Notes (Signed)
Pt arrived to [place] [facility] around 1415.  Pt became unresponsive soon after.  HR was below 40.  Dr. Sherral Hammers was paged.  Rapid response was called. Her HR dropped to 23 the lowest.  NS was given and soon her HR came back up spontaneously.  No atropine was given.  Pt currently resting in bed with O2 2L via [place] and HOB up 30 degrees.  Idolina Primer, RN

## 2017-06-24 NOTE — ED Triage Notes (Signed)
Assisted Pt grandson to reposition Pt in the BED.

## 2017-06-24 NOTE — Progress Notes (Signed)
PROGRESS NOTE    Heather [name]  WUJ:[id number] DOB: [birth year] DOA: 06/23/2017 PCP: Biagio Borg, MD   Brief Narrative:  84 y.o. female PMHx CVA,Dementia,HLD,HTN, Chronic Diastolic CHF,CKD stage III, DVT on Xarelto,  Presenting to the emergency department following a syncopal episode at home.  This is the patient's third episode in recent months.  She had reportedly been in her usual state of health and was sitting up eating when she stated she needed to lay down, grunted, and was then briefly unresponsive.  She recovered quickly.  EMS was called out and she was found to have heart rate in the 30s, dipping down into the 20s.  This was a sinus rhythm on the strip.  There was no recent fall or trauma, no chest pain or palpitations, and no headaches, change in vision or hearing, or focal numbness or weakness.  Patient vomited after regaining consciousness.  Family reports that her heart rate is usually in the 40s.  Of note, she takes Coreg 25 mg twice daily.   ED Course: Upon arrival to the ED, patient is found to be afebrile, saturating well on room air, bradycardic in the 40s, and with normal blood pressure.  EKG features a sinus bradycardia with rate 43 and first-degree AV nodal block.  Chest x-ray is notable for cardiomegaly with large hiatal hernia.  Chemistry panel features a creatinine of 1.05, lower than priors.  CBC is unremarkable.  BNP is slightly elevated and troponin is undetectable.  TSH is elevated to 8.644 with normal T4.  Cardiology was consulted by the ED physician and recommends a medical admission, stopping Coreg, and continuing cardiac monitoring.  Patient remains bradycardic with stable blood pressure and no further syncopal episode in the ED.  She will be admitted to the stepdown unit for ongoing evaluation and management of syncope, suspected cardiogenic, likely related to Coreg.    Subjective: 3/14 A/O 4, negative SOB, negative CP, negative abdominal pain. States has had  several episodes of syncope and collapse over the past several weeks.    Assessment & Plan:   Principal Problem:   Syncope, cardiogenic Active Problems:   Essential hypertension   Dementia with behavioral disturbance   Personal history of DVT (deep vein thrombosis)   Diastolic CHF, chronic (HCC)   CKD (chronic kidney disease), stage III (HCC)   Chronic hypoxemic respiratory failure (HCC)   Sinus bradycardia   History of completed stroke   Subclinical hypothyroidism   Syncope and collapse -iatrogenic most likely secondary to over aggressive blockade with nodal blocking agent (Coreg), subclinical hypothyroidism may be a contributing factor -DC Coreg and all other notable blocking agents. -DC amlodipine -Normal saline bolus 500 ml -Atropine for symptomatic bradycardia:HR< 40  -cardiology following patient does not believe requires pacing at this time. With washout of normal blocking agents hope to stabilize HR. -Echocardiogram pending  Sinus bradycardia  - HR in 40's in ED, had been in 30's and dipping into 20's with EMS  - EKG features sinus bradycardia with rate 43 and 1st degree AV block  - See copy and collapse  Hypotension/EssentialHTN -currently patient with symptomatic bradycardia/hypotension -DC amlodipine, Lotensin for now. Will restart as patient's symptoms resolve   Subclinical Hypothyroidism  - TSH elevated to 8.6 with normal T4 -may be contributing somewhat to her bradycardia for most likely majority secondary to beta blocker.  - Given her symptoms Will start low-dose Synthroid 12.5 g daily.    Dementia  -  must be early stage as  patient is A/O 4 answers appropriately questions concerning her past health history.  - Continue Aricept, continue Seroquel for associated behavior disturbance     CKD stage III(admission Cr 1.05)  Recent Labs  Lab 06/23/17 1624 06/24/17 0330  CREATININE 1.05* 0.87  -baseline -Avoid nephrotoxic medication   Hx of DVT  -  No evidence for acute VTE - continue Xarelto     DVT prophylaxis: Xarelto Code Status: full Family Communication: grandson at bedside Disposition Plan: TBD   Consultants:  Cardiology    Procedures/Significant Events:  Echocardiogram pending   I have personally reviewed and interpreted all radiology studies and my findings are as above.  VENTILATOR SETTINGS:    Cultures   Antimicrobials:    Devices    LINES / TUBES:      Continuous Infusions: . sodium chloride       Objective: Vitals:   06/24/17 1005 06/24/17 1040 06/24/17 1100 06/24/17 1200  BP:   (!) 105/54 (!) 102/52  Pulse: (!) 51 98 (!) 49 (!) 31  Resp: (!) 24 20 (!) 21 15  Temp:      TempSrc:      SpO2: 100% 99% 100% 96%  Weight:      Height:       No intake or output data in the 24 hours ending 06/24/17 1254 Filed Weights   06/23/17 1627  Weight: 225 lb (102.1 kg)    Examination:  General: A/O 4,No acute respiratory distress Neck:  Negative scars, masses, torticollis, lymphadenopathy, JVD Lungs: Clear to auscultation bilaterally without wheezes or crackles Cardiovascular: bradycardic,,Regular rhythm without murmur gallop or rub normal S1 and S2 Abdomen: obese, negative abdominal pain, nondistended, positive soft, bowel sounds, no rebound, no ascites, no appreciable mass Extremities: No significant cyanosis, clubbing, or edema bilateral lower extremities Skin: Negative rashes, lesions, ulcers Psychiatric:  Negative depression, negative anxiety, negative fatigue, negative mania  Central nervous system:  Cranial nerves II through XII intact, tongue/uvula midline, bilateral upper extremity muscle strength 5/5, R LE muscle strength 2/5, LLE muscle strength 3-4/5, sensation intact throughout,  negative dysarthria, negative expressive aphasia, negative receptive aphasia.  .     Data Reviewed: Care during the described time interval was provided by me .  I have reviewed this patient's  available data, including medical history, events of note, physical examination, and all test results as part of my evaluation.   CBC: Recent Labs  Lab 06/23/17 1624 06/24/17 0330  WBC 4.3 4.6  NEUTROABS 1.7  --   HGB 11.4* 12.1  HCT 36.9 39.3  MCV 86.4 86.2  PLT 253 657   Basic Metabolic Panel: Recent Labs  Lab 06/23/17 1624 06/24/17 0330  NA 138 137  K 4.0 4.9  CL 106 105  CO2 24 23  GLUCOSE 177* 102*  BUN 19 18  CREATININE 1.05* 0.87  CALCIUM 8.8* 8.7*  MG 2.3  --    GFR: Estimated Creatinine Clearance: 52 mL/min (by C-G formula based on SCr of 0.87 mg/dL). Liver Function Tests: Recent Labs  Lab 06/23/17 1624  AST 19  ALT 13*  ALKPHOS 74  BILITOT 0.8  PROT 6.3*  ALBUMIN 2.6*   No results for input(s): LIPASE, AMYLASE in the last 168 hours. No results for input(s): AMMONIA in the last 168 hours. Coagulation Profile: No results for input(s): INR, PROTIME in the last 168 hours. Cardiac Enzymes: No results for input(s): CKTOTAL, CKMB, CKMBINDEX, TROPONINI in the last 168 hours. BNP (last 3 results) No results for input(s):  PROBNP in the last 8760 hours. HbA1C: No results for input(s): HGBA1C in the last 72 hours. CBG: Recent Labs  Lab 06/23/17 1629  GLUCAP 158*   Lipid Profile: No results for input(s): CHOL, HDL, LDLCALC, TRIG, CHOLHDL, LDLDIRECT in the last 72 hours. Thyroid Function Tests: Recent Labs    06/23/17 1714 06/23/17 1939  TSH 8.644*  --   FREET4  --  0.84   Anemia Panel: No results for input(s): VITAMINB12, FOLATE, FERRITIN, TIBC, IRON, RETICCTPCT in the last 72 hours. Urine analysis:    Component Value Date/Time   COLORURINE YELLOW 06/23/2017 2354   APPEARANCEUR CLEAR 06/23/2017 2354   LABSPEC 1.017 06/23/2017 2354   PHURINE 5.0 06/23/2017 2354   GLUCOSEU NEGATIVE 06/23/2017 2354   GLUCOSEU NEGATIVE 03/17/2016 1727   HGBUR NEGATIVE 06/23/2017 2354   BILIRUBINUR NEGATIVE 06/23/2017 2354   KETONESUR NEGATIVE 06/23/2017 2354     PROTEINUR NEGATIVE 06/23/2017 2354   UROBILINOGEN 0.2 03/17/2016 1727   NITRITE NEGATIVE 06/23/2017 2354   LEUKOCYTESUR NEGATIVE 06/23/2017 2354   Sepsis Labs: @LABRCNTIP (procalcitonin:4,lacticidven:4)  )No results found for this or any previous visit (from the past 240 hour(s)).       Radiology Studies: Dg Chest Port 1 View  Result Date: 06/23/2017 CLINICAL DATA:  Syncope EXAM: PORTABLE CHEST 1 VIEW COMPARISON:  01/24/2016 FINDINGS: Cardiomegaly. Stable eventration of the right hemidiaphragm. Large hiatal hernia. No confluent airspace opacities or effusions. No acute bony abnormality. IMPRESSION: Cardiomegaly. Eventration of the right hemidiaphragm. Large hiatal hernia. Electronically Signed   By: Rolm Baptise M.D.   On: 06/23/2017 16:57        Scheduled Meds: . amLODipine  5 mg Oral QHS  . benazepril  40 mg Oral Daily  . bisacodyl  5 mg Oral q morning - 10a  . buPROPion  75 mg Oral QHS  . donepezil  10 mg Oral QHS  . pantoprazole  40 mg Oral Daily  . QUEtiapine  50 mg Oral q morning - 10a  . rivaroxaban  10 mg Oral QPM  . sodium chloride flush  3 mL Intravenous Q12H  . sodium chloride flush  3 mL Intravenous Q12H   Continuous Infusions: . sodium chloride       LOS: 1 day    Time spent: 40 minutes    WOODS, Geraldo Docker, MD [facility] Pager [phone]   If 7PM-7AM, please contact night-coverage [email or web address] Password [facility] 06/24/2017, 12:54 PM

## 2017-06-24 NOTE — Progress Notes (Signed)
  Echocardiogram 2D Echocardiogram has been performed.  Bobbye Charleston 06/24/2017, 12:10 PM

## 2017-06-24 NOTE — ED Triage Notes (Signed)
PT 's Grandson assisting Pt to eat Lunch.

## 2017-06-24 NOTE — ED Triage Notes (Signed)
US at bedside

## 2017-06-24 NOTE — ED Notes (Signed)
Patient family was given a cup of coffee.

## 2017-06-24 NOTE — ED Notes (Signed)
Patient transported to PODF- Report given to [facility]; Pt transferred to hospital bed-Monique,RN

## 2017-06-25 ENCOUNTER — Other Ambulatory Visit: Payer: Self-pay

## 2017-06-25 DIAGNOSIS — E039 Hypothyroidism, unspecified: Secondary | ICD-10-CM

## 2017-06-25 DIAGNOSIS — I10 Essential (primary) hypertension: Secondary | ICD-10-CM

## 2017-06-25 DIAGNOSIS — I959 Hypotension, unspecified: Secondary | ICD-10-CM

## 2017-06-25 LAB — BASIC METABOLIC PANEL
Anion gap: 6 (ref 5–15)
BUN: 20 mg/dL (ref 6–20)
CO2: 24 mmol/L (ref 22–32)
Calcium: 8.7 mg/dL — ABNORMAL LOW (ref 8.9–10.3)
Chloride: 108 mmol/L (ref 101–111)
Creatinine, Ser: 1.03 mg/dL — ABNORMAL HIGH (ref 0.44–1.00)
GFR calc Af Amer: 55 mL/min — ABNORMAL LOW (ref >60)
GFR calc non Af Amer: 48 mL/min — ABNORMAL LOW (ref >60)
Glucose, Bld: 78 mg/dL (ref 65–99)
Potassium: 5 mmol/L (ref 3.5–5.1)
Sodium: 138 mmol/L (ref 135–145)

## 2017-06-25 LAB — CBC
HCT: 37.8 % (ref 36.0–46.0)
Hemoglobin: 11.8 g/dL — ABNORMAL LOW (ref 12.0–15.0)
MCH: 27.3 pg (ref 26.0–34.0)
MCHC: 31.2 g/dL (ref 30.0–36.0)
MCV: 87.5 fL (ref 78.0–100.0)
Platelets: 223 10*3/uL (ref 150–400)
RBC: 4.32 MIL/uL (ref 3.87–5.11)
RDW: 17.2 % — ABNORMAL HIGH (ref 11.5–15.5)
WBC: 4.5 10*3/uL (ref 4.0–10.5)

## 2017-06-25 LAB — MAGNESIUM: Magnesium: 2.5 mg/dL — ABNORMAL HIGH (ref 1.7–2.4)

## 2017-06-25 LAB — GLUCOSE, CAPILLARY: Glucose-Capillary: 75 mg/dL (ref 65–99)

## 2017-06-25 MED ORDER — ORAL CARE MOUTH RINSE
15.0000 mL | Freq: Two times a day (BID) | OROMUCOSAL | Status: DC
  Administered 2017-06-25 – 2017-06-27 (×4): 15 mL via OROMUCOSAL

## 2017-06-25 NOTE — Progress Notes (Signed)
[place] TEAM 1 - Stepdown/ICU TEAM  Heather [name]  FUX:[id number] DOB: 12/28/1928 DOA: 06/23/2017 PCP: Biagio Borg, MD    Brief Narrative:  84 yo female w/ a history of dementia (bedbound), HTN, HLD, prior CVA, CKD stage 3, and DVT on xarelto who presented with syncope and was noted to be bradycardic in ER to the 40s.  Of note she was on coreg 25mg  bid at home/  Significant Events: 3/13 admit 3/14 TTE - EF 55-60% - no WMA - grade 1 DD - no AoS - mild Ao and Mitral regurg   Subjective: The patient is sleeping but resting comfortably in bed.  I spoke to multiple family members at the bedside.  The patient denies chest pain fever chills nausea vomiting or abdominal pain.  She relates a poor appetite and is not yet been up and out of bed/on her feet.  Assessment & Plan:  Syncopeand collapse - Sinus bradycardia  Cardiology feels this is secondary to Coreg - now off Coreg - HR slowly recovering - mobilize w/ PT/OT   HTN BP quite variable - err on the side of mildly elevated in this elderly pt w/ recent syncopal spells - follow trend   Hypothyroidism TSH elevated at 8.6 - Free T4 normal - low dose synthroid initiated - will continue for now, w/ need to recheck TSH in 8 - 12 weeks   Dementia continue Aricept and Seroquel for associated behavior disturbance  CKD stage III crt is stable at this time   Hx of DVT No sx to suggests acute VTE -continue Xareltoas per home regimen   DVT prophylaxis: Xarelto  Code Status: FULL CODE Family Communication: spoke w/ multiple family members at bedside   Disposition Plan: d/c when mobile and HR stable   Consultants:  Cardiology   Antimicrobials:  none  Objective: Blood pressure 137/62, pulse 67, temperature 97.6 F (36.4 C), temperature source Oral, resp. rate 19, height 5\' 2"  (1.575 m), weight 95.3 kg (210 lb 1.6 oz), SpO2 96 %.  Intake/Output Summary (Last 24 hours) at 06/25/2017 1550 Last data filed at 06/25/2017  0448 Gross per 24 hour  Intake 1057 ml  Output 300 ml  Net 757 ml   Filed Weights   06/23/17 1627 06/24/17 1420 06/25/17 0435  Weight: 102.1 kg (225 lb) 98.7 kg (217 lb 9.5 oz) 95.3 kg (210 lb 1.6 oz)    Examination: General: No acute respiratory distress Lungs: Clear to auscultation bilaterally without wheezes or crackles Cardiovascular: bradycardic but regular - no rub  Abdomen: Nontender, nondistended, soft, bowel sounds positive, no rebound, no ascites, no appreciable mass Extremities: No significant cyanosis, clubbing, or edema bilateral lower extremities  CBC: Recent Labs  Lab 06/23/17 1624 06/24/17 0330 06/25/17 0643  WBC 4.3 4.6 4.5  NEUTROABS 1.7  --   --   HGB 11.4* 12.1 11.8*  HCT 36.9 39.3 37.8  MCV 86.4 86.2 87.5  PLT 253 229 025   Basic Metabolic Panel: Recent Labs  Lab 06/23/17 1624 06/24/17 0330 06/25/17 0643  NA 138 137 138  K 4.0 4.9 5.0  CL 106 105 108  CO2 24 23 24   GLUCOSE 177* 102* 78  BUN 19 18 20   CREATININE 1.05* 0.87 1.03*  CALCIUM 8.8* 8.7* 8.7*  MG 2.3  --  2.5*   GFR: Estimated Creatinine Clearance: 42.2 mL/min (A) (by C-G formula based on SCr of 1.03 mg/dL (H)).  Liver Function Tests: Recent Labs  Lab 06/23/17 1624  AST 19  ALT 13*  ALKPHOS 74  BILITOT 0.8  PROT 6.3*  ALBUMIN 2.6*   HbA1C: Hgb A1c MFr Bld  Date/Time Value Ref Range Status  12/15/2016 11:29 AM 6.4 4.6 - 6.5 % Final    Comment:    Glycemic Control Guidelines for People with Diabetes:Non Diabetic:  <6%Goal of Therapy: <7%Additional Action Suggested:  >8%   06/12/2016 12:51 PM 6.2 4.6 - 6.5 % Final    Comment:    Glycemic Control Guidelines for People with Diabetes:Non Diabetic:  <6%Goal of Therapy: <7%Additional Action Suggested:  >8%     CBG: Recent Labs  Lab 06/23/17 1629 06/24/17 1429 06/25/17 0508  GLUCAP 158* 118* 75    Recent Results (from the past 240 hour(s))  MRSA PCR Screening     Status: None   Collection Time: 06/24/17  2:35 PM   Result Value Ref Range Status   MRSA by PCR NEGATIVE NEGATIVE Final    Comment:        The GeneXpert MRSA Assay (FDA approved for NASAL specimens only), is one component of a comprehensive MRSA colonization surveillance program. It is not intended to diagnose MRSA infection nor to guide or monitor treatment for MRSA infections. Performed at [facility], [place] [address], [place], [place] [address]      Scheduled Meds: . bisacodyl  5 mg Oral q morning - 10a  . buPROPion  75 mg Oral QHS  . donepezil  10 mg Oral QHS  . levothyroxine  12.5 mcg Oral QAC breakfast  . mouth rinse  15 mL Mouth Rinse BID  . pantoprazole  40 mg Oral Daily  . QUEtiapine  50 mg Oral q morning - 10a  . rivaroxaban  10 mg Oral QPM  . sodium chloride flush  3 mL Intravenous Q12H  . sodium chloride flush  3 mL Intravenous Q12H     LOS: 2 days   Cherene Altes, MD [facility] Office  [phone] Pager - Text Page per Shea Evans as per below:  On-Call/Text Page:      [email or web address]      password [email or web address]  If 7PM-7AM, please contact night-coverage [email or web address] Password [email or web address] 06/25/2017, 3:50 PM

## 2017-06-25 NOTE — Care Management Note (Signed)
Case Management Note  Patient Details  Name: Heather [name] MRN: [id number] Date of Birth: [birth year]  Subjective/Objective:  Pt presented for Syncope- PTA from home with support of daughter. Pt has personal care services via [facility] that family pays out of pocket for M-F 8 am -11 am. Pt has DME [place]. Family wants HHPT for Strengthening Exercises at home.                  Action/Plan: CM did offer choice and family wants to utilize [facility]. MD will need to place order for [facility] PT/ F2F once pt is stable for d/c. Referral made to [place] with [facility] and [place] to begin within 24-48 hours post d/c.   Expected Discharge Date:                  Expected Discharge Plan:  [place]  In-House Referral:  NA  Discharge planning Services  CM Consult  Post Acute Care Choice:  Home Health Choice offered to:  Patient, Adult Children  DME Arranged:  N/A DME Agency:  NA  HH Arranged:  PT [place] Agency:  [facility]  Status of Service:  Completed, signed off  If discussed at [place] of Stay Meetings, dates discussed:    Additional Comments:  Bethena Roys, RN 06/25/2017, 1:57 PM

## 2017-06-25 NOTE — Progress Notes (Signed)
Progress Note  Patient Name: Heather [name] Date of Encounter: 06/25/2017  Primary Cardiologist: No primary care provider on file.   Subjective   Doing much better this morning.  Minimum heart rate has been around 50s and is now as high as 65.  She is alert and appears to be fully oriented, without complaints of dyspnea, chest pressure or pain or any neurological complaints.  Her daughter is with her today.  She describes problems with chronic cough that often improves for a week or 2 after she received antibiotics for urinary tract infections.  Inpatient Medications    Scheduled Meds: . bisacodyl  5 mg Oral q morning - 10a  . buPROPion  75 mg Oral QHS  . donepezil  10 mg Oral QHS  . levothyroxine  12.5 mcg Oral QAC breakfast  . mouth rinse  15 mL Mouth Rinse BID  . pantoprazole  40 mg Oral Daily  . QUEtiapine  50 mg Oral q morning - 10a  . rivaroxaban  10 mg Oral QPM  . sodium chloride flush  3 mL Intravenous Q12H  . sodium chloride flush  3 mL Intravenous Q12H   Continuous Infusions: . sodium chloride    . sodium chloride 70 mL/hr at 06/25/17 0505  . sodium chloride     PRN Meds: sodium chloride, acetaminophen **OR** acetaminophen, atropine, sodium chloride flush, traMADol, traZODone   Vital Signs    Vitals:   06/24/17 1932 06/25/17 0036 06/25/17 0435 06/25/17 0805  BP: (!) 96/51 (!) 116/50 (!) 127/51 (!) 149/57  Pulse: (!) 45 (!) 46 (!) 46 (!) 52  Resp: 18 14 17 16   Temp: 98 F (36.7 C) 97.7 F (36.5 C) 97.8 F (36.6 C) (!) 97.5 F (36.4 C)  TempSrc: Oral Oral Oral Oral  SpO2: 100% 99% 96% 100%  Weight:   210 lb 1.6 oz (95.3 kg)   Height:        Intake/Output Summary (Last 24 hours) at 06/25/2017 1039 Last data filed at 06/25/2017 0448 Gross per 24 hour  Intake 1057 ml  Output 300 ml  Net 757 ml   Filed Weights   06/23/17 1627 06/24/17 1420 06/25/17 0435  Weight: 225 lb (102.1 kg) 217 lb 9.5 oz (98.7 kg) 210 lb 1.6 oz (95.3 kg)    Telemetry    Mild  sinus bradycardia/normal sinus rhythm- Personally Reviewed  ECG    No new tracing- Personally Reviewed  Physical Exam  Morbidly obese which limits her exam GEN: No acute distress.   Neck: No JVD Cardiac: RRR, no murmurs, rubs, or gallops.  Respiratory: Clear to auscultation bilaterally. GI: Soft, nontender, non-distended  MS: No edema; No deformity. Neuro:  Nonfocal  Psych: Normal affect   Labs    Chemistry Recent Labs  Lab 06/23/17 1624 06/24/17 0330 06/25/17 0643  NA 138 137 138  K 4.0 4.9 5.0  CL 106 105 108  CO2 24 23 24   GLUCOSE 177* 102* 78  BUN 19 18 20   CREATININE 1.05* 0.87 1.03*  CALCIUM 8.8* 8.7* 8.7*  PROT 6.3*  --   --   ALBUMIN 2.6*  --   --   AST 19  --   --   ALT 13*  --   --   ALKPHOS 74  --   --   BILITOT 0.8  --   --   GFRNONAA 47* 59* 48*  GFRAA 54* >60 55*  ANIONGAP 8 9 6      Hematology Recent Labs  Lab 06/23/17 1624 06/24/17 0330 06/25/17 0643  WBC 4.3 4.6 4.5  RBC 4.27 4.56 4.32  HGB 11.4* 12.1 11.8*  HCT 36.9 39.3 37.8  MCV 86.4 86.2 87.5  MCH 26.7 26.5 27.3  MCHC 30.9 30.8 31.2  RDW 16.7* 16.6* 17.2*  PLT 253 229 223    Cardiac EnzymesNo results for input(s): TROPONINI in the last 168 hours.  Recent Labs  Lab 06/23/17 1652 06/23/17 1939  TROPIPOC 0.00 0.00     BNP Recent Labs  Lab 06/23/17 1632  BNP 141.6*     DDimer No results for input(s): DDIMER in the last 168 hours.   Radiology    Dg Chest Port 1 View  Result Date: 06/23/2017 CLINICAL DATA:  Syncope EXAM: PORTABLE CHEST 1 VIEW COMPARISON:  01/24/2016 FINDINGS: Cardiomegaly. Stable eventration of the right hemidiaphragm. Large hiatal hernia. No confluent airspace opacities or effusions. No acute bony abnormality. IMPRESSION: Cardiomegaly. Eventration of the right hemidiaphragm. Large hiatal hernia. Electronically Signed   By: Rolm Baptise M.D.   On: 06/23/2017 16:57    Cardiac Studies   Echo 06/24/2017  - Left ventricle: The cavity size was normal.  There was mild focal   basal hypertrophy of the septum. Systolic function was normal.   The estimated ejection fraction was in the range of 55% to 60%.   Wall motion was normal; there were no regional wall motion   abnormalities. Doppler parameters are consistent with abnormal   left ventricular relaxation (grade 1 diastolic dysfunction). - Aortic valve: There was no stenosis. There was mild   regurgitation. - Mitral valve: There was mild regurgitation. - Left atrium: The atrium was mildly dilated. - Right ventricle: The cavity size was mildly dilated. Systolic   function was normal. - Tricuspid valve: Peak RV-RA gradient (S): 29 mm Hg. - Pulmonary arteries: PA peak pressure: 32 mm Hg (S). - Inferior vena cava: The vessel was normal in size. The   respirophasic diameter changes were in the normal range (= 50%),   consistent with normal central venous pressure.  Impressions:  - Normal LV size with mild focal basal septal hypertrophy. EF   55-60%. Mildly dilated RV with normal systolic function. Mild   aortic insufficiency, mild mitral regurgitation.  Patient Profile     84 y.o. female with morbid obesity admitted with symptomatic sinus bradycardia while on high-dose beta-blocker therapy.  Sternal problems include previous history of stroke and DVT on chronic anticoagulation, hypertension, hyperlipidemia, dementia  Assessment & Plan    1. Bradycardia: Markedly improved, full effect of carvedilol will persist through tomorrow.  He does not need a permanent pacemaker.  2. OXB:DZHG probably need to place on her alternative antihypertensive medication, complete solution will be increasing the dose of amlodipine (was on amlodipine 5 mg and benazepril 40 mg daily at home, both currently on hold). 3. Elevated TSH: Although free T4 level is normal, may benefit from a slight increase in her levothyroxine supplement dose  For questions or updates, please contact [place] Please consult  [email or web address] for contact info under Cardiology/STEMI.      Signed, Sanda Klein, MD  06/25/2017, 10:39 AM

## 2017-06-26 LAB — CBC
HCT: 38.9 % (ref 36.0–46.0)
Hemoglobin: 11.7 g/dL — ABNORMAL LOW (ref 12.0–15.0)
MCH: 26.7 pg (ref 26.0–34.0)
MCHC: 30.1 g/dL (ref 30.0–36.0)
MCV: 88.6 fL (ref 78.0–100.0)
Platelets: 220 10*3/uL (ref 150–400)
RBC: 4.39 MIL/uL (ref 3.87–5.11)
RDW: 17.4 % — ABNORMAL HIGH (ref 11.5–15.5)
WBC: 4.6 10*3/uL (ref 4.0–10.5)

## 2017-06-26 LAB — BASIC METABOLIC PANEL
Anion gap: 7 (ref 5–15)
BUN: 16 mg/dL (ref 6–20)
CO2: 24 mmol/L (ref 22–32)
Calcium: 8.7 mg/dL — ABNORMAL LOW (ref 8.9–10.3)
Chloride: 107 mmol/L (ref 101–111)
Creatinine, Ser: 0.93 mg/dL (ref 0.44–1.00)
GFR calc Af Amer: >60 mL/min (ref >60)
GFR calc non Af Amer: 54 mL/min — ABNORMAL LOW (ref >60)
Glucose, Bld: 77 mg/dL (ref 65–99)
Potassium: 4.6 mmol/L (ref 3.5–5.1)
Sodium: 138 mmol/L (ref 135–145)

## 2017-06-26 LAB — GLUCOSE, CAPILLARY
Glucose-Capillary: 64 mg/dL — ABNORMAL LOW (ref 65–99)
Glucose-Capillary: 95 mg/dL (ref 65–99)

## 2017-06-26 MED ORDER — BISACODYL 10 MG RE SUPP: 10.0000 mg | Freq: Every day | RECTAL | Status: DC | PRN

## 2017-06-26 MED ORDER — FUROSEMIDE 20 MG PO TABS
20.0000 mg | ORAL_TABLET | Freq: Every day | ORAL | Status: DC
  Administered 2017-06-26 – 2017-06-27 (×2): 20 mg via ORAL
  Filled 2017-06-26 (×2): qty 1

## 2017-06-26 MED ORDER — SENNOSIDES-DOCUSATE SODIUM 8.6-50 MG PO TABS
1.0000 | ORAL_TABLET | Freq: Two times a day (BID) | ORAL | Status: DC
  Administered 2017-06-27: 1 via ORAL
  Filled 2017-06-26: qty 1

## 2017-06-26 MED ORDER — AMLODIPINE BESYLATE 5 MG PO TABS
5.0000 mg | ORAL_TABLET | Freq: Every day | ORAL | Status: DC
  Administered 2017-06-26 – 2017-06-27 (×2): 5 mg via ORAL
  Filled 2017-06-26 (×2): qty 1

## 2017-06-26 MED ORDER — MAGNESIUM CITRATE PO SOLN
0.5000 | Freq: Once | ORAL | Status: DC | PRN
Start: 2017-06-26 — End: 2017-06-27

## 2017-06-26 MED ORDER — POLYETHYLENE GLYCOL 3350 17 G PO PACK
17.0000 g | PACK | Freq: Every day | ORAL | Status: DC
  Administered 2017-06-27: 17 g via ORAL
  Filled 2017-06-26 (×2): qty 1

## 2017-06-26 NOTE — Progress Notes (Signed)
Subjective:  Blood pressure is up this morning.  She has not had any syncope or dizziness.  She is alert lying in bed with no dyspnea chest pain or other issues.  Daughter notes that she had a low blood sugar last night and also has had some hypoxemia noted.  Objective:  Vital Signs in the last 24 hours: BP (!) 170/81   Pulse (!) 53   Temp 97.9 F (36.6 C) (Oral)   Resp 20   Ht 5\' 2"  (1.575 m)   Wt 95.3 kg (210 lb 1.6 oz)   SpO2 99%   BMI 38.43 kg/m   Physical Exam: Obese black female in no acute distress Lungs:  Clear  Cardiac:  Regular rhythm, normal S1 and S2, no S3, 1/6 systolic murmur Extremities:  No edema present  Intake/Output from previous day: 03/15 0701 - 03/16 0700 In: 1198 [P.O.:598; I.V.:600] Out: 500 [Urine:500] Weight Filed Weights   06/24/17 1420 06/25/17 0435 06/26/17 0639  Weight: 98.7 kg (217 lb 9.5 oz) 95.3 kg (210 lb 1.6 oz) 95.3 kg (210 lb 1.6 oz)    Lab Results: Basic Metabolic Panel: Recent Labs    06/25/17 0643 06/26/17 0359  NA 138 138  K 5.0 4.6  CL 108 107  CO2 24 24  GLUCOSE 78 77  BUN 20 16  CREATININE 1.03* 0.93    CBC: Recent Labs    06/23/17 1624  06/25/17 0643 06/26/17 0359  WBC 4.3   < > 4.5 4.6  NEUTROABS 1.7  --   --   --   HGB 11.4*   < > 11.8* 11.7*  HCT 36.9   < > 37.8 38.9  MCV 86.4   < > 87.5 88.6  PLT 253   < > 223 220   < > = values in this interval not displayed.    BNP    Component Value Date/Time   BNP 141.6 (H) 06/23/2017 1632   Telemetry: Personally reviewed, occasional sinus bradycardia but nothing below 50  Assessment/Plan:  1.  Bradycardia which has improved-evidently no indication for permanent pacemaker 2.  Dementia 3.  Hypertensive heart disease-will need to increase amlodipine dose  Recommendations:  Remaining problems to be addressed include hypoxemia and she may need oxygen for and addressing her blood pressure issues.I would restart amlodipine at this point and monitor blood  pressures.     Kerry Hough  MD [facility] Cardiology  06/26/2017, 11:02 AM

## 2017-06-26 NOTE — Progress Notes (Signed)
[place] TEAM 1 - Stepdown/ICU TEAM  SHAKA ZECH  ZOX:[id number] DOB: Oct 17, 1928 DOA: 06/23/2017 PCP: Biagio Borg, MD    Brief Narrative:  84 yo female w/ a history of dementia (bedbound), HTN, HLD, prior CVA, CKD stage 3, and DVT on xarelto who presented with syncope and was noted to be bradycardic in ER to the 40s.  Of note she was on coreg 25mg  bid at home/  Significant Events: 3/13 admit 3/14 TTE - EF 55-60% - no WMA - grade 1 DD - no AoS - mild Ao and Mitral regurg   Subjective: O2 sat dropped in to 80s and even 70s last night - unclear if these were valid readings as sat presently 100% on 2LNC only.  Daughter w/ whom she lives confirms she has O2 already arranged at home, which she uses intermittently.  Daughter has the ability to check her sats at home.  Pt denies cp, sob, n/v.  She has not been up and out of bed yet (baseline is up in a chair th/o the day - uses wheelchair for mobility as she is not able to walk).  Assessment & Plan:  Syncopeand collapse - Sinus bradycardia  Cardiology feels this is secondary to Coreg - now off Coreg - HR appears stable at ~50-60 - mobilize w/ PT/OT up into chair  Grade 1 Diastolic CHF Noted on TTE 06/24/17, w/ EF 55-60% and no WMA - trace LE edema recurring - resume home lasix and follow   HTN BP quite variable - err on the side of mildly elevated in this elderly pt w/ recent syncopal spells but w/ systolics in 811-914 range agree w/ resuming Norvasc - will also resume lasix   Hypothyroidism TSH elevated at 8.6 - Free T4 normal - low dose synthroid initiated - continue for now, w/ need to recheck TSH in 8 - 12 weeks   Dementia continue Aricept and Seroquel for associated behavior disturbance  CKD stage III crt is stable at this time   Hx of DVT No sx to suggests acute VTE -continue Xareltoas per home regimen   Large HH   DVT prophylaxis: Xarelto  Code Status: FULL CODE Family Communication: spoke w/ dgtr with whom  she lives at bedside   Disposition Plan: d/c when able to get up into chair and HR stable - probable d/c 3/17  Consultants:  Cardiology   Antimicrobials:  none  Objective: Blood pressure (!) 164/93, pulse 62, temperature (!) 97.3 F (36.3 C), temperature source Oral, resp. rate 20, height 5\' 2"  (1.575 m), weight 95.3 kg (210 lb 1.6 oz), SpO2 99 %.  Intake/Output Summary (Last 24 hours) at 06/26/2017 1419 Last data filed at 06/26/2017 1300 Gross per 24 hour  Intake 958 ml  Output 1100 ml  Net -142 ml   Filed Weights   06/24/17 1420 06/25/17 0435 06/26/17 0639  Weight: 98.7 kg (217 lb 9.5 oz) 95.3 kg (210 lb 1.6 oz) 95.3 kg (210 lb 1.6 oz)    Examination: General: No acute respiratory distress at rest in bed on 2L [place] Lungs: Clear to auscultation bilaterally  Cardiovascular: bradycardic but regular   Abdomen: Nontender, nondistended, soft, bowel sounds positive, no rebound Extremities: trace B LE edema   CBC: Recent Labs  Lab 06/23/17 1624 06/24/17 0330 06/25/17 0643 06/26/17 0359  WBC 4.3 4.6 4.5 4.6  NEUTROABS 1.7  --   --   --   HGB 11.4* 12.1 11.8* 11.7*  HCT 36.9 39.3 37.8 38.9  MCV 86.4 86.2 87.5 88.6  PLT 253 229 223 563   Basic Metabolic Panel: Recent Labs  Lab 06/23/17 1624 06/24/17 0330 06/25/17 0643 06/26/17 0359  NA 138 137 138 138  K 4.0 4.9 5.0 4.6  CL 106 105 108 107  CO2 24 23 24 24   GLUCOSE 177* 102* 78 77  BUN 19 18 20 16   CREATININE 1.05* 0.87 1.03* 0.93  CALCIUM 8.8* 8.7* 8.7* 8.7*  MG 2.3  --  2.5*  --    GFR: Estimated Creatinine Clearance: 46.8 mL/min (by C-G formula based on SCr of 0.93 mg/dL).  Liver Function Tests: Recent Labs  Lab 06/23/17 1624  AST 19  ALT 13*  ALKPHOS 74  BILITOT 0.8  PROT 6.3*  ALBUMIN 2.6*   HbA1C: Hgb A1c MFr Bld  Date/Time Value Ref Range Status  12/15/2016 11:29 AM 6.4 4.6 - 6.5 % Final    Comment:    Glycemic Control Guidelines for People with Diabetes:Non Diabetic:  <6%Goal of Therapy:  <7%Additional Action Suggested:  >8%   06/12/2016 12:51 PM 6.2 4.6 - 6.5 % Final    Comment:    Glycemic Control Guidelines for People with Diabetes:Non Diabetic:  <6%Goal of Therapy: <7%Additional Action Suggested:  >8%     CBG: Recent Labs  Lab 06/23/17 1629 06/24/17 1429 06/25/17 0508 06/26/17 0606 06/26/17 0630  GLUCAP 158* 118* 75 64* 95    Recent Results (from the past 240 hour(s))  MRSA PCR Screening     Status: None   Collection Time: 06/24/17  2:35 PM  Result Value Ref Range Status   MRSA by PCR NEGATIVE NEGATIVE Final    Comment:        The GeneXpert MRSA Assay (FDA approved for NASAL specimens only), is one component of a comprehensive MRSA colonization surveillance program. It is not intended to diagnose MRSA infection nor to guide or monitor treatment for MRSA infections. Performed at [facility], [place] [address], [place], [place] [address]      Scheduled Meds: . amLODipine  5 mg Oral Daily  . buPROPion  75 mg Oral QHS  . donepezil  10 mg Oral QHS  . levothyroxine  12.5 mcg Oral QAC breakfast  . mouth rinse  15 mL Mouth Rinse BID  . pantoprazole  40 mg Oral Daily  . polyethylene glycol  17 g Oral Daily  . QUEtiapine  50 mg Oral q morning - 10a  . rivaroxaban  10 mg Oral QPM  . senna-docusate  1 tablet Oral BID  . sodium chloride flush  3 mL Intravenous Q12H     LOS: 3 days   Cherene Altes, MD [facility] Office  [phone] Pager - Text Page per Shea Evans as per below:  On-Call/Text Page:      [email or web address]      password [email or web address]  If 7PM-7AM, please contact night-coverage [email or web address] Password [email or web address] 06/26/2017, 2:19 PM

## 2017-06-26 NOTE — Evaluation (Signed)
Physical Therapy Evaluation Patient Details Name: Heather [name] MRN: [id number] DOB: [birth year] Today's Date: 06/26/2017   History of Present Illness  84 yo female w/ a history of dementia, HTN, HLD, prior CVA, CKD stage 3, and DVT on xarelto who presented with syncope and was noted to be bradycardic in ER to the 40s.   Clinical Impression  Pt admitted with/for sycopal episodes.  Pt continues to need max ot total assist.  Pt currently limited functionally due to the problems listed below.  (see problems list.)  Pt will benefit from PT to maximize function and safety to be able to get home safely with available assist of dtr.     Follow Up Recommendations SNF    Equipment Recommendations  None recommended by PT    Recommendations for Other Services       Precautions / Restrictions Precautions Precautions: Fall Restrictions Weight Bearing Restrictions: No      Mobility  Bed Mobility Overal bed mobility: Needs Assistance Bed Mobility: Rolling;Sidelying to Sit;Sit to Supine Rolling: Max assist Sidelying to sit: Max assist;+2 for physical assistance Supine to sit: Max assist;+2 for physical assistance Sit to supine: Max assist;+2 for physical assistance   General bed mobility comments: pt follow direction readily, initiates movement to cues, but still needs significant assist for transitions.  Transfers Overall transfer level: Needs assistance Equipment used: Rolling walker (2 wheeled) Transfers: Sit to/from Stand Sit to Stand: Total assist;+2 physical assistance         General transfer comment: pt unable to attain upright stance standing in the RW at EOB, but effort was noticeable.  Ambulation/Gait                Stairs            Wheelchair Mobility    Modified Rankin (Stroke Patients Only)       Balance                                             Pertinent Vitals/Pain Pain Assessment: Faces Pain Score: 1  Faces Pain  Scale: No hurt    Home Living Family/patient expects to be discharged to:: Private residence Living Arrangements: Children Available Help at Discharge: Family;Personal care attendant;Available 24 hours/day Type of Home: House Home Access: Level entry     Home Layout: One level Home Equipment: Wheelchair - manual;Bedside commode;Hospital bed      Prior Function Level of Independence: Needs assistance   Gait / Transfers Assistance Needed: Does not ambulate. Hoyer lift to w/c  ADL's / Homemaking Assistance Needed: PCA assist with bathing, dressing, toileting, and hoyer lift OOB; daughter performs meal prep and ADLs. Pt able to self feed  Comments: OOB a min of 4x a day and sits OOB for ~20-30 mintues     Hand Dominance        Extremity/Trunk Assessment   Upper Extremity Assessment Upper Extremity Assessment: Defer to OT evaluation    Lower Extremity Assessment Lower Extremity Assessment: Generalized weakness;Overall WFL for tasks assessed(L weaker than right as would be expected fom last stroke)    Cervical / Trunk Assessment Cervical / Trunk Assessment: Kyphotic;Other exceptions Cervical / Trunk Exceptions: Poor trunk stubility  Communication   Communication: No difficulties  Cognition Arousal/Alertness: Awake/alert Behavior During Therapy: WFL for tasks assessed/performed;Flat affect Overall Cognitive Status: History of cognitive impairments - at baseline  General Comments: Dementia at baseline      General Comments      Exercises     Assessment/Plan    PT Assessment    PT Problem List         PT Treatment Interventions      PT Goals (Current goals can be found in the Care Plan section)  Acute Rehab PT Goals Patient Stated Goal: Dtr wants pt to get pt stronger to decr the daily burden. PT Goal Formulation: With patient Time For Goal Achievement: 07/10/17 Potential to Achieve Goals: Good    Frequency      Barriers to discharge        Co-evaluation               AM-PAC PT "6 Clicks" Daily Activity  Outcome Measure Difficulty turning over in bed (including adjusting bedclothes, sheets and blankets)?: Unable Difficulty moving from lying on back to sitting on the side of the bed? : Unable Difficulty sitting down on and standing up from a chair with arms (e.g., wheelchair, bedside commode, etc,.)?: Unable Help needed moving to and from a bed to chair (including a wheelchair)?: Total Help needed walking in hospital room?: Total Help needed climbing 3-5 steps with a railing? : Total 6 Click Score: 6    End of Session Equipment Utilized During Treatment: Oxygen            Time: 7672-0947 PT Time Calculation (min) (ACUTE ONLY): 39 min   Charges:   PT Evaluation $PT Eval Moderate Complexity: 1 Mod PT Treatments $Therapeutic Activity: 8-22 mins   PT G Codes:        07/17/17  Donnella Sham, PT [phone]  (pager)  Heather [name] 07/17/2017, 5:05 PM

## 2017-06-26 NOTE — Evaluation (Signed)
Occupational Therapy Evaluation Patient Details Name: Heather [name] MRN: [id number] DOB: Feb 12, 1929 Today's Date: 06/26/2017    History of Present Illness 84 yo female w/ a history of dementia, HTN, HLD, prior CVA, CKD stage 3, and DVT on xarelto who presented with syncope and was noted to be bradycardic in ER to the 40s.    Clinical Impression   PTA, pt was living with her daughter who assisted with all ADLs. Pt with PCA who assists with ADLs and using hoyer lift for transfers to w/c. Pt performing self feeding at baseline. Pt currently requiring Min Guard A for grooming at EOB, Max A for dressing/bathing, and Max A +2 for bed mobility. Pt would benefit from further acute OT to facilitate safe dc. Recommend dc to home with daughter and with HHOT to optimize safety, independence with ADLs, and decrease caregiver burden.     Follow Up Recommendations  Home health OT;Supervision/Assistance - 24 hour    Equipment Recommendations  None recommended by OT    Recommendations for Other Services PT consult     Precautions / Restrictions Precautions Precautions: Fall Restrictions Weight Bearing Restrictions: No      Mobility Bed Mobility Overal bed mobility: Needs Assistance Bed Mobility: Rolling;Sidelying to Sit;Sit to Supine Rolling: Max assist Sidelying to sit: Max assist;+2 for physical assistance Supine to sit: Max assist;+2 for physical assistance Sit to supine: Max assist;+2 for physical assistance   General bed mobility comments: pt follow direction readily, initiates movement to cues, but still needs significant assist for transitions.  Transfers Overall transfer level: Needs assistance Equipment used: Rolling walker (2 wheeled) Transfers: Sit to/from Stand Sit to Stand: Total assist;+2 physical assistance         General transfer comment: pt unable to attain upright stance standing in the RW at EOB, but effort was noticeable.    Balance                                            ADL either performed or assessed with clinical judgement   ADL Overall ADL's : Needs assistance/impaired Eating/Feeding: Cueing for safety;Cueing for sequencing;Bed level;Minimal assistance   Grooming: Cueing for safety;Cueing for sequencing;Min guard;Wash/dry face;Sitting Grooming Details (indicate cue type and reason): Sitting EOB, Min Guard for safety and washing her face. Limited ROM to reach forward towards washcloth Upper Body Bathing: Maximal assistance;Bed level   Lower Body Bathing: Maximal assistance;Bed level;+2 for physical assistance   Upper Body Dressing : Maximal assistance;Bed level   Lower Body Dressing: Maximal assistance;Bed level               Functional mobility during ADLs: +2 for physical assistance;Total assistance(sit<>stand and not reaching full standing) General ADL Comments: Pt sitting at EOB and performing grooming tasks. Daughter very supportive and asking questions to optimize safety at home     Vision         Perception     Praxis      Pertinent Vitals/Pain Pain Assessment: Faces Faces Pain Scale: No hurt Pain Intervention(s): Monitored during session     Hand Dominance Right   Extremity/Trunk Assessment Upper Extremity Assessment Upper Extremity Assessment: Generalized weakness   Lower Extremity Assessment Lower Extremity Assessment: Defer to PT evaluation   Cervical / Trunk Assessment Cervical / Trunk Assessment: Kyphotic Cervical / Trunk Exceptions: Poor trunk stubility   Communication Communication Communication: No difficulties  Cognition Arousal/Alertness: Awake/alert Behavior During Therapy: WFL for tasks assessed/performed;Flat affect Overall Cognitive Status: History of cognitive impairments - at baseline                                 General Comments: Dementia at baseline   General Comments  Daughter present throughout session. SpO2 90s on roomair    Exercises      Shoulder Instructions      Home Living Family/patient expects to be discharged to:: Private residence Living Arrangements: Children Available Help at Discharge: Family;Personal care attendant;Available 24 hours/day Type of Home: House Home Access: Level entry     Home Layout: One level     Bathroom Shower/Tub: Walk-in shower;Door     Bathroom Accessibility: No   Home Equipment: Wheelchair - [employer];Hospital bed          Prior Functioning/Environment Level of Independence: Needs assistance  Gait / Transfers Assistance Needed: Does not ambulate. Hoyer lift to w/c ADL's / Homemaking Assistance Needed: PCA assist with bathing, dressing, toileting, and hoyer lift OOB; daughter performs meal prep and ADLs. Pt able to self feed   Comments: PCA each day from 8-11 am. OOB a min of 4x a day and sits OOB for ~20-30 mintues        OT Problem List: Decreased strength;Decreased range of motion;Decreased activity tolerance;Impaired balance (sitting and/or standing);Decreased cognition;Decreased safety awareness;Decreased knowledge of use of DME or AE;Decreased knowledge of precautions;Pain      OT Treatment/Interventions: Self-care/ADL training;Therapeutic exercise;Energy conservation;DME and/or AE instruction;Therapeutic activities;Patient/family education    OT Goals(Current goals can be found in the care plan section) Acute Rehab OT Goals Patient Stated Goal: Dtr wants pt to get pt stronger to decr the daily burden. OT Goal Formulation: With patient/family Time For Goal Achievement: 07/10/17 Potential to Achieve Goals: Good ADL Goals Pt Will Perform Eating: with set-up;with supervision;sitting Pt Will Perform Grooming: with set-up;with supervision;sitting Pt Will Perform Upper Body Dressing: with min assist;sitting Additional ADL Goal #1: Pt will perform bed mobility with Min A in preparation for ADLs in sitting  OT Frequency: Min 2X/week   Barriers to D/C:             Co-evaluation              AM-PAC PT "6 Clicks" Daily Activity     Outcome Measure Help from another person eating meals?: A Little Help from another person taking care of personal grooming?: A Little Help from another person toileting, which includes using toliet, bedpan, or urinal?: A Lot Help from another person bathing (including washing, rinsing, drying)?: A Lot Help from another person to put on and taking off regular upper body clothing?: A Lot Help from another person to put on and taking off regular lower body clothing?: A Lot 6 Click Score: 14   End of Session Equipment Utilized During Treatment: Gait belt;Rolling walker;Oxygen Nurse Communication: Mobility status  Activity Tolerance: Patient tolerated treatment well;Patient limited by fatigue Patient left: in bed;with call bell/phone within reach;with family/visitor present;with bed alarm set  OT Visit Diagnosis: Unsteadiness on feet (R26.81);Other abnormalities of gait and mobility (R26.89);Muscle weakness (generalized) (M62.81);Pain                Time: 6712-4580 OT Time Calculation (min): 39 min Charges:  OT General Charges $OT Visit: 1 Visit OT Evaluation $OT Eval Moderate Complexity: 1 Mod G-Codes:     Charis Capehart MSOT, OTR/L  Acute Rehab Pager: [phone] Office: [place] 06/26/2017, 6:15 PM

## 2017-06-27 MED ORDER — LEVOTHYROXINE SODIUM 25 MCG PO TABS: 12.5000 ug | ORAL_TABLET | Freq: Every day | ORAL | 0 refills | Status: DC

## 2017-06-27 MED ORDER — AMLODIPINE BESYLATE 10 MG PO TABS: 10.0000 mg | ORAL_TABLET | Freq: Every day | ORAL | 0 refills | Status: DC

## 2017-06-27 MED ORDER — AMLODIPINE BESYLATE 10 MG PO TABS: 10.0000 mg | ORAL_TABLET | Freq: Every day | ORAL | Status: DC

## 2017-06-27 NOTE — Progress Notes (Signed)
Pt for d/c home today- notified [facility] of discharge for [facility] needs- PTAR called for non-emergent transport home-

## 2017-06-27 NOTE — Discharge Instructions (Signed)
Information on my medicine - XARELTO (rivaroxaban)  This medication education was reviewed with me or my healthcare representative as part of my discharge preparation.  The pharmacist that spoke with me during my hospital stay was:  Marney Setting, [place]? Xarelto was prescribed to treat blood clots that may have been found in the veins of your legs (deep vein thrombosis) or in your lungs (pulmonary embolism) and to reduce the risk of them occurring again.  What do you need to know about Xarelto? Xarelto should be taken by mouth ONCE A DAY with your evening meal.   DO NOT stop taking Xarelto without talking to the health care provider who prescribed the medication.  Refill your prescription before you run out.  After discharge, you should have regular check-up appointments with your healthcare provider that is prescribing your Xarelto.  In the future your dose may need to be changed if your kidney function changes by a significant amount.  What do you do if you miss a dose? If you are taking Xarelto TWICE DAILY and you miss a dose, take it as soon as you remember. You may take two tablets (total 30 mg) at the same time then resume your regularly scheduled twice daily the next day.  If you are taking Xarelto ONCE DAILY and you miss a dose, take it as soon as you remember on the same day then continue your regularly scheduled once daily regimen the next day. Do not take two doses of Xarelto at the same time.   Important Safety Information Xarelto is a blood thinner medicine that can cause bleeding. You should call your healthcare provider right away if you experience any of the following: ? Bleeding from an injury or your nose that does not stop. ? Unusual colored urine (red or dark brown) or unusual colored stools (red or black). ? Unusual bruising for unknown reasons. ? A serious fall or if you hit your head (even if there is no bleeding).  Some  medicines may interact with Xarelto and might increase your risk of bleeding while on Xarelto. To help avoid this, consult your healthcare provider or pharmacist prior to using any new prescription or non-prescription medications, including herbals, vitamins, non-steroidal anti-inflammatory drugs (NSAIDs) and supplements.  This website has more information on Xarelto: [email or web address]-benson.[email or web address]

## 2017-06-27 NOTE — Progress Notes (Addendum)
CSW received phone call from [employer] concerning transportation needs for pt home.  CSW contacted RNCM Steffanie Dunn was transportation needs for pt.  No further needs noted.  CSW signing off.  Reed Breech LCSWA [phone]

## 2017-06-27 NOTE — Progress Notes (Signed)
Subjective:  Possibly being discharged today.  Blood pressure still slightly up and would restart antihypertensives at previous level.  No significant bradycardia.  Still desaturates some.  Objective:  Vital Signs in the last 24 hours: BP (!) 147/79 (BP Location: Left Arm)   Pulse (!) 57   Temp 98.2 F (36.8 C) (Oral)   Resp 18   Ht 5\' 2"  (1.575 m)   Wt 94.2 kg (207 lb 11.2 oz)   SpO2 99%   BMI 37.99 kg/m   Physical Exam: Obese black female in no acute distress Lungs:  Clear  Cardiac:  Regular rhythm, normal S1 and S2, no S3, 1/6 systolic murmur Extremities:  No edema present  Intake/Output from previous day: 03/16 0701 - 03/17 0700 In: 480 [P.O.:480] Out: 2200 [Urine:2200] Weight Filed Weights   06/25/17 0435 06/26/17 0639 06/27/17 0516  Weight: 95.3 kg (210 lb 1.6 oz) 95.3 kg (210 lb 1.6 oz) 94.2 kg (207 lb 11.2 oz)    Lab Results: Basic Metabolic Panel: Recent Labs    06/25/17 0643 06/26/17 0359  NA 138 138  K 5.0 4.6  CL 108 107  CO2 24 24  GLUCOSE 78 77  BUN 20 16  CREATININE 1.03* 0.93    CBC: Recent Labs    06/25/17 0643 06/26/17 0359  WBC 4.5 4.6  HGB 11.8* 11.7*  HCT 37.8 38.9  MCV 87.5 88.6  PLT 223 220    BNP    Component Value Date/Time   BNP 141.6 (H) 06/23/2017 1632   Telemetry: Personally reviewed, occasional sinus bradycardia but nothing below 50  Assessment/Plan:  1.  Bradycardia which has improved-evidently no indication for permanent pacemaker 2.  Dementia 3.  Hypertensive heart disease-will need to increase amlodipine dose  Recommendations:  Her carvedilol was stopped because of bradycardia.  I would increase her amlodipine to 10 mg on discharge and go ahead and add back her Lotensin 40 mg.  May need to have a diuretic as an outpatient to help control blood pressure.     Kerry Hough  MD [facility]  06/27/2017, 10:50 AM

## 2017-06-27 NOTE — Progress Notes (Signed)
Pt discharged via PTAR with family at bedside. I reviewed d/c instructions with patients daughter. No further questions. Pt stable and assessment unchanged from this am

## 2017-06-27 NOTE — Discharge Summary (Signed)
Physician Discharge Summary  Heather [name] VZD:[id number] DOB: [birth year] DOA: 06/23/2017  PCP: Biagio Borg, MD  Admit date: 06/23/2017 Discharge date: 06/27/2017  Time spent: > 35 minutes  Recommendations for Outpatient Follow-up:  1. Please recheck thyroid levels in 4-6 weeks as patient was placed on synthroid 2. Also B blocker d/c'd due to bradycardia 3. Monitor blood pressures and adjust medication regimen appropriately   Discharge Diagnoses:  Principal Problem:   Syncope, cardiogenic Active Problems:   Essential hypertension   Dementia with behavioral disturbance   Personal history of DVT (deep vein thrombosis)   Diastolic CHF, chronic (HCC)   CKD (chronic kidney disease), stage III (HCC)   Chronic hypoxemic respiratory failure (HCC)   Sinus bradycardia   History of completed stroke   Subclinical hypothyroidism   Bradycardia, drug induced   Discharge Condition: stable  Diet recommendation: regular diet  Filed Weights   06/25/17 0435 06/26/17 0639 06/27/17 0516  Weight: 95.3 kg (210 lb 1.6 oz) 95.3 kg (210 lb 1.6 oz) 94.2 kg (207 lb 11.2 oz)    History of present illness:  84 yo female w/ a history of dementia (bedbound), HTN, HLD, prior CVA, CKD stage 3, and DVT on xarelto who presented with syncope and was noted to be bradycardic in ER to the 40s.  Of note she was on coreg 25mg  bid at home/  Hospital Course:  Syncopeand collapse - Sinus bradycardia  Cardiology feels this is secondary to Coreg - now off Coreg - HR appears stable at ~50-60  - cardiology cleared for d/c  Grade 1 Diastolic CHF Noted on TTE 06/24/17, w/ EF 55-60% and no WMA - diuretic can be resumed by specialist or pcp as outpatient. Pt indicated to follow up with pcp or cardiologist on d/c  HTN Holding B blocker due to bradycardia - otherwise continue Amlodipine at higher dose and donezepil  Hypothyroidism TSH elevatedat 8.6 - Free T4 normal - low dose synthroid initiated - continue for  now, w/ need to recheck TSH in 8 - 12 weeks   Dementia continue Aricept and Seroquel for associated behavior disturbance  CKD stage III crt is stable at this time   Hx of DVT No sx to suggests acute VTE -continue Xareltoas per home regimen    Procedures:  none  Consultations:  Cardiology  Discharge Exam: Vitals:   06/27/17 0516 06/27/17 0800  BP: (!) 146/80 (!) 147/79  Pulse: (!) 56 (!) 57  Resp: 15 18  Temp: 98.1 F (36.7 C) 98.2 F (36.8 C)  SpO2: 100% 99%    General: Pt in nad, alert and awake Cardiovascular: rrr, no rubs Respiratory: no increased wob, no wheezes  Discharge Instructions   Discharge Instructions    Call MD for:  severe uncontrolled pain   Complete by:  As directed    Call MD for:  temperature >100.4   Complete by:  As directed    Diet - low sodium heart healthy   Complete by:  As directed    Discharge instructions   Complete by:  As directed    Please follow up with your cardiologist for further evaluation and recommendations   Increase activity slowly   Complete by:  As directed      Allergies as of 06/27/2017      Reactions   Lipitor [atorvastatin] Other (See Comments)   myalgia   Codeine Other (See Comments)   Lovastatin Other (See Comments)   Pravastatin Sodium Other (See Comments)  Medication List    STOP taking these medications   carvedilol 25 MG tablet Commonly known as:  COREG   furosemide 40 MG tablet Commonly known as:  LASIX   LAXATIVE PO   nystatin powder Commonly known as:  MYCOSTATIN/NYSTOP   ondansetron 4 MG tablet Commonly known as:  ZOFRAN   traZODone 50 MG tablet Commonly known as:  DESYREL     TAKE these medications   acetaminophen 500 MG tablet Commonly known as:  TYLENOL Take 500 mg by mouth every 6 (six) hours as needed for moderate pain (knee, leg, stomach).   amLODipine 10 MG tablet Commonly known as:  NORVASC Take 1 tablet (10 mg total) by mouth daily. Start taking on:   06/28/2017 What changed:    medication strength  how much to take   benazepril 40 MG tablet Commonly known as:  LOTENSIN TAKE ONE TABLET BY MOUTH ONCE DAILY   buPROPion 75 MG tablet Commonly known as:  WELLBUTRIN Take 1 tablet at bedtime   donepezil 10 MG tablet Commonly known as:  ARICEPT Take 1 tablet daily   levothyroxine 25 MCG tablet Commonly known as:  SYNTHROID, LEVOTHROID Take 0.5 tablets (12.5 mcg total) by mouth daily before breakfast. Start taking on:  06/28/2017   pantoprazole 40 MG tablet Commonly known as:  PROTONIX TAKE 1 TABLET BY MOUTH  DAILY   QUEtiapine 50 MG tablet Commonly known as:  SEROQUEL 1 tab by mouth in the AM   rivaroxaban 10 MG Tabs tablet Commonly known as:  XARELTO Take 1 tablet (10 mg total) daily by mouth.   traMADol 50 MG tablet Commonly known as:  ULTRAM TAKE 1 TABLET BY MOUTH EVERY 8 HOURS AS NEEDED What changed:    how much to take  how to take this  when to take this      Allergies  Allergen Reactions  . Lipitor [Atorvastatin] Other (See Comments)    myalgia  . Codeine Other (See Comments)  . Lovastatin Other (See Comments)  . Pravastatin Sodium Other (See Comments)   Follow-up [place].   Specialty:  Home Health Services Why:  Physical Therapy for evaluation and treatment. (Make sure orders are in EPIC prior to d/c). Contact information: [place] [address] [phone]            The results of significant diagnostics from this hospitalization (including imaging, microbiology, ancillary and laboratory) are listed below for reference.    Significant Diagnostic Studies: Dg Chest Port 1 View  Result Date: 06/23/2017 CLINICAL DATA:  Syncope EXAM: PORTABLE CHEST 1 VIEW COMPARISON:  01/24/2016 FINDINGS: Cardiomegaly. Stable eventration of the right hemidiaphragm. Large hiatal hernia. No confluent airspace opacities or effusions.  No acute bony abnormality. IMPRESSION: Cardiomegaly. Eventration of the right hemidiaphragm. Large hiatal hernia. Electronically Signed   By: Rolm Baptise M.D.   On: 06/23/2017 16:57    Microbiology: Recent Results (from the past 240 hour(s))  MRSA PCR Screening     Status: None   Collection Time: 06/24/17  2:35 PM  Result Value Ref Range Status   MRSA by PCR NEGATIVE NEGATIVE Final    Comment:        The GeneXpert MRSA Assay (FDA approved for NASAL specimens only), is one component of a comprehensive MRSA colonization surveillance program. It is not intended to diagnose MRSA infection nor to guide or monitor treatment for MRSA infections. Performed at [facility]  [facility], [place] [address], [place], [place] [address]      Labs: Basic Metabolic Panel: Recent Labs  Lab 06/23/17 1624 06/24/17 0330 06/25/17 0643 06/26/17 0359  NA 138 137 138 138  K 4.0 4.9 5.0 4.6  CL 106 105 108 107  CO2 24 23 24 24   GLUCOSE 177* 102* 78 77  BUN 19 18 20 16   CREATININE 1.05* 0.87 1.03* 0.93  CALCIUM 8.8* 8.7* 8.7* 8.7*  MG 2.3  --  2.5*  --    Liver Function Tests: Recent Labs  Lab 06/23/17 1624  AST 19  ALT 13*  ALKPHOS 74  BILITOT 0.8  PROT 6.3*  ALBUMIN 2.6*   No results for input(s): LIPASE, AMYLASE in the last 168 hours. No results for input(s): AMMONIA in the last 168 hours. CBC: Recent Labs  Lab 06/23/17 1624 06/24/17 0330 06/25/17 0643 06/26/17 0359  WBC 4.3 4.6 4.5 4.6  NEUTROABS 1.7  --   --   --   HGB 11.4* 12.1 11.8* 11.7*  HCT 36.9 39.3 37.8 38.9  MCV 86.4 86.2 87.5 88.6  PLT 253 229 223 220   Cardiac Enzymes: No results for input(s): CKTOTAL, CKMB, CKMBINDEX, TROPONINI in the last 168 hours. BNP: BNP (last 3 results) Recent Labs    06/23/17 1632  BNP 141.6*    ProBNP (last 3 results) No results for input(s): PROBNP in the last 8760 hours.  CBG: Recent Labs  Lab 06/23/17 1629 06/24/17 1429 06/25/17 0508 06/26/17 0606 06/26/17 0630  GLUCAP  158* 118* 75 64* 95    Signed:  Velvet Bathe MD.  [facility] 06/27/2017, 12:41 PM

## 2017-06-28 ENCOUNTER — Telehealth: Payer: Self-pay | Admitting: *Deleted

## 2017-06-28 DIAGNOSIS — F0391 Unspecified dementia with behavioral disturbance: Secondary | ICD-10-CM | POA: Diagnosis not present

## 2017-06-28 DIAGNOSIS — I2699 Other pulmonary embolism without acute cor pulmonale: Secondary | ICD-10-CM | POA: Diagnosis not present

## 2017-06-28 DIAGNOSIS — M8580 Other specified disorders of bone density and structure, unspecified site: Secondary | ICD-10-CM | POA: Diagnosis not present

## 2017-06-28 DIAGNOSIS — N183 Chronic kidney disease, stage 3 (moderate): Secondary | ICD-10-CM | POA: Diagnosis not present

## 2017-06-28 DIAGNOSIS — I130 Hypertensive heart and chronic kidney disease with heart failure and stage 1 through stage 4 chronic kidney disease, or unspecified chronic kidney disease: Secondary | ICD-10-CM | POA: Diagnosis not present

## 2017-06-28 DIAGNOSIS — I5032 Chronic diastolic (congestive) heart failure: Secondary | ICD-10-CM | POA: Diagnosis not present

## 2017-06-28 DIAGNOSIS — I6309 Cerebral infarction due to thrombosis of other precerebral artery: Secondary | ICD-10-CM | POA: Diagnosis not present

## 2017-06-28 DIAGNOSIS — I69354 Hemiplegia and hemiparesis following cerebral infarction affecting left non-dominant side: Secondary | ICD-10-CM | POA: Diagnosis not present

## 2017-06-28 DIAGNOSIS — J9611 Chronic respiratory failure with hypoxia: Secondary | ICD-10-CM | POA: Diagnosis not present

## 2017-06-28 DIAGNOSIS — G4700 Insomnia, unspecified: Secondary | ICD-10-CM | POA: Diagnosis not present

## 2017-06-28 DIAGNOSIS — J9601 Acute respiratory failure with hypoxia: Secondary | ICD-10-CM | POA: Diagnosis not present

## 2017-06-28 DIAGNOSIS — F329 Major depressive disorder, single episode, unspecified: Secondary | ICD-10-CM | POA: Diagnosis not present

## 2017-06-28 DIAGNOSIS — M170 Bilateral primary osteoarthritis of knee: Secondary | ICD-10-CM | POA: Diagnosis not present

## 2017-06-28 DIAGNOSIS — M109 Gout, unspecified: Secondary | ICD-10-CM | POA: Diagnosis not present

## 2017-06-28 DIAGNOSIS — K219 Gastro-esophageal reflux disease without esophagitis: Secondary | ICD-10-CM | POA: Diagnosis not present

## 2017-06-28 DIAGNOSIS — I872 Venous insufficiency (chronic) (peripheral): Secondary | ICD-10-CM | POA: Diagnosis not present

## 2017-06-28 NOTE — Progress Notes (Signed)
Late entry- post discharge- HH order referral form signed by Dr. Wendee Beavers and faxed back to [facility] for HHRN/PT

## 2017-06-28 NOTE — Telephone Encounter (Signed)
Pt was on TCM report admitted 06/23/17 for Syncope, cardiogenic. Cardiology feels this is secondary to [place] - now off [place] stable at ~50-60 cardiology cleared for D/C 06/27/17, and will f/u w/cardiology.Marland KitchenJohny Chess

## 2017-06-29 ENCOUNTER — Telehealth: Payer: Self-pay | Admitting: Internal Medicine

## 2017-06-29 NOTE — Telephone Encounter (Signed)
MD out of the office pls advise../lmb 

## 2017-06-29 NOTE — Telephone Encounter (Signed)
Okay to give verbal order.

## 2017-06-29 NOTE — Telephone Encounter (Signed)
Copied from [place] [phone]. Topic: Quick Communication - See Telephone Encounter >> Jun 29, 2017  9:51 AM Synthia Innocent wrote: CRM for notification. See Telephone encounter for:  Requesting verbal orders for PT 2x a week for 4 week, nursing and OT eval 06/29/17.

## 2017-06-29 NOTE — Telephone Encounter (Signed)
Notified Roselyn Reef w/ MD response.Marland KitchenJohny Chess

## 2017-07-01 DIAGNOSIS — J9601 Acute respiratory failure with hypoxia: Secondary | ICD-10-CM | POA: Diagnosis not present

## 2017-07-01 DIAGNOSIS — I2699 Other pulmonary embolism without acute cor pulmonale: Secondary | ICD-10-CM | POA: Diagnosis not present

## 2017-07-01 DIAGNOSIS — I6309 Cerebral infarction due to thrombosis of other precerebral artery: Secondary | ICD-10-CM | POA: Diagnosis not present

## 2017-07-01 DIAGNOSIS — I5032 Chronic diastolic (congestive) heart failure: Secondary | ICD-10-CM | POA: Diagnosis not present

## 2017-07-26 ENCOUNTER — Telehealth: Payer: Self-pay | Admitting: Internal Medicine

## 2017-07-26 DIAGNOSIS — G4700 Insomnia, unspecified: Secondary | ICD-10-CM | POA: Diagnosis not present

## 2017-07-26 DIAGNOSIS — Z86718 Personal history of other venous thrombosis and embolism: Secondary | ICD-10-CM

## 2017-07-26 DIAGNOSIS — Z9981 Dependence on supplemental oxygen: Secondary | ICD-10-CM

## 2017-07-26 DIAGNOSIS — F0391 Unspecified dementia with behavioral disturbance: Secondary | ICD-10-CM | POA: Diagnosis not present

## 2017-07-26 DIAGNOSIS — J9611 Chronic respiratory failure with hypoxia: Secondary | ICD-10-CM | POA: Diagnosis not present

## 2017-07-26 DIAGNOSIS — M170 Bilateral primary osteoarthritis of knee: Secondary | ICD-10-CM | POA: Diagnosis not present

## 2017-07-26 DIAGNOSIS — Z7901 Long term (current) use of anticoagulants: Secondary | ICD-10-CM

## 2017-07-26 DIAGNOSIS — F329 Major depressive disorder, single episode, unspecified: Secondary | ICD-10-CM | POA: Diagnosis not present

## 2017-07-26 DIAGNOSIS — I5032 Chronic diastolic (congestive) heart failure: Secondary | ICD-10-CM

## 2017-07-26 DIAGNOSIS — N183 Chronic kidney disease, stage 3 (moderate): Secondary | ICD-10-CM

## 2017-07-26 DIAGNOSIS — M109 Gout, unspecified: Secondary | ICD-10-CM | POA: Diagnosis not present

## 2017-07-26 DIAGNOSIS — M8580 Other specified disorders of bone density and structure, unspecified site: Secondary | ICD-10-CM | POA: Diagnosis not present

## 2017-07-26 DIAGNOSIS — Z9181 History of falling: Secondary | ICD-10-CM

## 2017-07-26 DIAGNOSIS — K219 Gastro-esophageal reflux disease without esophagitis: Secondary | ICD-10-CM

## 2017-07-26 DIAGNOSIS — I130 Hypertensive heart and chronic kidney disease with heart failure and stage 1 through stage 4 chronic kidney disease, or unspecified chronic kidney disease: Secondary | ICD-10-CM | POA: Diagnosis not present

## 2017-07-26 DIAGNOSIS — E7800 Pure hypercholesterolemia, unspecified: Secondary | ICD-10-CM

## 2017-07-26 DIAGNOSIS — I872 Venous insufficiency (chronic) (peripheral): Secondary | ICD-10-CM | POA: Diagnosis not present

## 2017-07-26 DIAGNOSIS — I69354 Hemiplegia and hemiparesis following cerebral infarction affecting left non-dominant side: Secondary | ICD-10-CM | POA: Diagnosis not present

## 2017-07-26 NOTE — Telephone Encounter (Signed)
Copied from [place] [phone]. Topic: Quick Communication - See Telephone Encounter >> Jul 26, 2017  1:45 PM Boyd Kerbs wrote: CRM for notification.   [place] [phone] needing verbal orders for extend PT for 2 x 4 .    See Telephone encounter for: 07/26/17.

## 2017-07-26 NOTE — Telephone Encounter (Signed)
Ok for verbal 

## 2017-07-27 NOTE — Telephone Encounter (Signed)
Called PT no answer LMOM w/MD approval../lmb

## 2017-07-28 DIAGNOSIS — J9601 Acute respiratory failure with hypoxia: Secondary | ICD-10-CM | POA: Diagnosis not present

## 2017-07-28 DIAGNOSIS — I5032 Chronic diastolic (congestive) heart failure: Secondary | ICD-10-CM | POA: Diagnosis not present

## 2017-07-28 DIAGNOSIS — I2699 Other pulmonary embolism without acute cor pulmonale: Secondary | ICD-10-CM | POA: Diagnosis not present

## 2017-07-29 DIAGNOSIS — I2699 Other pulmonary embolism without acute cor pulmonale: Secondary | ICD-10-CM | POA: Diagnosis not present

## 2017-07-29 DIAGNOSIS — I6309 Cerebral infarction due to thrombosis of other precerebral artery: Secondary | ICD-10-CM | POA: Diagnosis not present

## 2017-07-29 DIAGNOSIS — I5032 Chronic diastolic (congestive) heart failure: Secondary | ICD-10-CM | POA: Diagnosis not present

## 2017-07-29 DIAGNOSIS — J9601 Acute respiratory failure with hypoxia: Secondary | ICD-10-CM | POA: Diagnosis not present

## 2017-08-10 ENCOUNTER — Encounter (HOSPITAL_COMMUNITY): Payer: Self-pay | Admitting: Pharmacy Technician

## 2017-08-10 ENCOUNTER — Emergency Department (HOSPITAL_COMMUNITY)
Admission: EM | Admit: 2017-08-10 | Discharge: 2017-08-10 | Disposition: A | Discharge status: Home / Self Care | Payer: Medicare Other | Attending: Emergency Medicine | Admitting: Emergency Medicine

## 2017-08-10 DIAGNOSIS — I5032 Chronic diastolic (congestive) heart failure: Secondary | ICD-10-CM | POA: Diagnosis not present

## 2017-08-10 DIAGNOSIS — I130 Hypertensive heart and chronic kidney disease with heart failure and stage 1 through stage 4 chronic kidney disease, or unspecified chronic kidney disease: Secondary | ICD-10-CM | POA: Insufficient documentation

## 2017-08-10 DIAGNOSIS — R55 Syncope and collapse: Secondary | ICD-10-CM | POA: Diagnosis not present

## 2017-08-10 DIAGNOSIS — F0390 Unspecified dementia without behavioral disturbance: Secondary | ICD-10-CM | POA: Diagnosis not present

## 2017-08-10 DIAGNOSIS — Z79899 Other long term (current) drug therapy: Secondary | ICD-10-CM | POA: Diagnosis not present

## 2017-08-10 DIAGNOSIS — R404 Transient alteration of awareness: Secondary | ICD-10-CM | POA: Diagnosis not present

## 2017-08-10 DIAGNOSIS — R531 Weakness: Secondary | ICD-10-CM | POA: Diagnosis not present

## 2017-08-10 DIAGNOSIS — E039 Hypothyroidism, unspecified: Secondary | ICD-10-CM | POA: Diagnosis not present

## 2017-08-10 DIAGNOSIS — N183 Chronic kidney disease, stage 3 (moderate): Secondary | ICD-10-CM | POA: Insufficient documentation

## 2017-08-10 LAB — CBC
HCT: 39 % (ref 36.0–46.0)
Hemoglobin: 12 g/dL (ref 12.0–15.0)
MCH: 26.7 pg (ref 26.0–34.0)
MCHC: 30.8 g/dL (ref 30.0–36.0)
MCV: 86.7 fL (ref 78.0–100.0)
Platelets: 267 10*3/uL (ref 150–400)
RBC: 4.5 MIL/uL (ref 3.87–5.11)
RDW: 18 % — ABNORMAL HIGH (ref 11.5–15.5)
WBC: 6.1 10*3/uL (ref 4.0–10.5)

## 2017-08-10 LAB — BASIC METABOLIC PANEL
Anion gap: 9 (ref 5–15)
BUN: 15 mg/dL (ref 6–20)
CO2: 26 mmol/L (ref 22–32)
Calcium: 9.1 mg/dL (ref 8.9–10.3)
Chloride: 104 mmol/L (ref 101–111)
Creatinine, Ser: 0.94 mg/dL (ref 0.44–1.00)
GFR calc Af Amer: >60 mL/min (ref >60)
GFR calc non Af Amer: 53 mL/min — ABNORMAL LOW (ref >60)
Glucose, Bld: 149 mg/dL — ABNORMAL HIGH (ref 65–99)
Potassium: 5 mmol/L (ref 3.5–5.1)
Sodium: 139 mmol/L (ref 135–145)

## 2017-08-10 LAB — I-STAT CHEM 8, ED
BUN: 26 mg/dL — ABNORMAL HIGH (ref 6–20)
Calcium, Ion: 0.9 mmol/L — ABNORMAL LOW (ref 1.15–1.40)
Chloride: 112 mmol/L — ABNORMAL HIGH (ref 101–111)
Creatinine, Ser: 0.9 mg/dL (ref 0.44–1.00)
Glucose, Bld: 129 mg/dL — ABNORMAL HIGH (ref 65–99)
HCT: 38 % (ref 36.0–46.0)
Hemoglobin: 12.9 g/dL (ref 12.0–15.0)
Potassium: 8.2 mmol/L (ref 3.5–5.1)
Sodium: 133 mmol/L — ABNORMAL LOW (ref 135–145)
TCO2: 19 mmol/L — ABNORMAL LOW (ref 22–32)

## 2017-08-10 LAB — I-STAT TROPONIN, ED: Troponin i, poc: 0 ng/mL (ref 0.00–0.08)

## 2017-08-10 LAB — CBG MONITORING, ED: Glucose-Capillary: 124 mg/dL — ABNORMAL HIGH (ref 65–99)

## 2017-08-10 MED ORDER — ONDANSETRON 4 MG PO TBDP
4.0000 mg | ORAL_TABLET | Freq: Once | ORAL | Status: AC
  Administered 2017-08-10: 4 mg via ORAL
  Filled 2017-08-10: qty 1

## 2017-08-10 NOTE — ED Notes (Signed)
ED Provider at bedside. 

## 2017-08-10 NOTE — ED Provider Notes (Signed)
[facility] EMERGENCY DEPARTMENT Provider Note   CSN: [id number] Arrival date & time: 08/10/17  1328     History   Chief Complaint Chief Complaint  Patient presents with  . Loss of Consciousness    HPI Heather [name] is a 84 y.o. female.  84 yo F with a chief complaint of a syncopal event.  The patient was out at a all-you-can-eat buffet the [place] [employer] and had more than her normal meal.  She then became less responsive than normal and then woke up and had a few episodes of vomiting.  She has had episodes like this recently and had been recently admitted to the hospital for the same.  She currently is complaining of nausea but denies chest pain shortness of breath diaphoresis diarrhea.  She had been well up until that moment.  She denies dark stool or blood in her stool denies fevers or chills denies cough or congestion.  Patient is bedbound from a prior stroke  The history is provided by the patient.  Illness  This is a recurrent problem. The current episode started less than 1 hour ago. The problem occurs rarely. The problem has been resolved. Pertinent negatives include no chest pain, no headaches and no shortness of breath. Nothing aggravates the symptoms. Nothing relieves the symptoms. She has tried nothing for the symptoms. The treatment provided no relief.    Past Medical History:  Diagnosis Date  . ALLERGIC RHINITIS 12/09/2006  . DEGENERATIVE JOINT DISEASE, LEFT KNEE 01/23/2007  . Encephalocele ([place]) 01/23/2007  . FEVER UNSPECIFIED 01/28/2010  . GERD 12/09/2006  . GLUCOSE INTOLERANCE 01/24/2009  . GOUT 12/09/2006  . HYPERCHOLESTEROLEMIA 12/09/2006  . HYPERLIPIDEMIA 12/18/2006  . HYPERTENSION 12/09/2006  . Impaired glucose tolerance 07/26/2010  . MENOPAUSAL DISORDER 01/25/2008  . Stroke [facility])     Patient Active Problem List   Diagnosis Date Noted  . Bradycardia, drug induced   . Syncope, cardiogenic 06/23/2017  . Sinus bradycardia 06/23/2017  .  History of completed stroke 06/23/2017  . Subclinical hypothyroidism 06/23/2017  . Nausea without vomiting 02/16/2017  . Facial droop   . Transient alteration of awareness   . Acute metabolic encephalopathy 48/54/6270  . Seizure ([place]) 01/25/2017  . Adhesive capsulitis of left shoulder 01/02/2017  . Acute bilateral low back pain without sciatica 01/02/2017  . Gait disorder 12/17/2016  . Do not resuscitate discussion 12/17/2016  . Right shoulder pain 09/25/2016  . Insomnia 08/16/2016  . Dysuria 08/16/2016  . Chronic hypoxemic respiratory failure ([place]) 06/12/2016  . Nocturia 03/17/2016  . Mild dementia 03/09/2016  . Stroke syndrome 03/09/2016  . CKD (chronic kidney disease), stage III ([place]) 02/05/2016  . Rash 02/05/2016  . Personal history of DVT (deep vein thrombosis) 01/28/2016  . Diastolic CHF, chronic ([place]) 01/28/2016  . Acute kidney injury ([place])   . Pulmonary embolus ([place])   . Acute respiratory failure with hypoxia ([place]) 01/24/2016  . Dementia with behavioral disturbance 01/01/2016  . Non compliance w medication regimen 01/01/2016  . General weakness 01/01/2016  . Peripheral edema 01/01/2016  . Chronic venous insufficiency 08/01/2014  . Fall at home 08/01/2013  . Osteopenia 08/01/2013  . Left knee pain 01/29/2011  . Encounter for well adult exam with abnormal findings 07/26/2010  . Impaired glucose tolerance 07/26/2010  . MENOPAUSAL DISORDER 01/25/2008  . Osteoarthrosis involving lower leg 01/23/2007  . Encephalocele ([place]) 01/23/2007  . Hyperlipidemia 12/18/2006  . GOUT 12/09/2006  . Essential hypertension 12/09/2006  . ALLERGIC RHINITIS 12/09/2006  .  GERD 12/09/2006    Past Surgical History:  Procedure Laterality Date  . s/p anterior encephalocele repair and craniotomy  2003     OB History   None      Home Medications    Prior to Admission medications   Medication Sig Start Date End Date Taking? Authorizing Provider  acetaminophen (TYLENOL) 500 MG tablet  Take 500 mg by mouth every 6 (six) hours as needed for moderate pain (knee, leg, stomach).   Yes [provider]  amLODipine (NORVASC) 10 MG tablet Take 1 tablet (10 mg total) by mouth daily. 06/28/17  Yes Velvet Bathe, MD  benazepril (LOTENSIN) 40 MG tablet TAKE ONE TABLET BY MOUTH ONCE DAILY 02/01/17  Yes Biagio Borg, MD  buPROPion [facility]) 75 MG tablet Take 1 tablet at bedtime Patient taking differently: Take 75 mg by mouth at bedtime.  03/10/17  Yes Cameron Sprang, MD  donepezil (ARICEPT) 10 MG tablet Take 1 tablet daily Patient taking differently: Take 10 mg by mouth at bedtime.  03/10/17  Yes Cameron Sprang, MD  levothyroxine (SYNTHROID, LEVOTHROID) 25 MCG tablet Take 0.5 tablets (12.5 mcg total) by mouth daily before breakfast. 06/28/17  Yes Velvet Bathe, MD  pantoprazole (PROTONIX) 40 MG tablet TAKE 1 TABLET BY MOUTH  DAILY 06/04/17  Yes Plotnikov, Evie Lacks, MD  QUEtiapine (SEROQUEL) 50 MG tablet 1 tab by mouth in the AM Patient taking differently: Take 50 mg by mouth daily.  09/25/16  Yes Biagio Borg, MD  rivaroxaban (XARELTO) 10 MG TABS tablet Take 1 tablet (10 mg total) daily by mouth. Patient taking differently: Take 10 mg by mouth every evening.  02/16/17  Yes Plotnikov, Evie Lacks, MD  traMADol (ULTRAM) 50 MG tablet TAKE 1 TABLET BY MOUTH EVERY 8 HOURS AS NEEDED Patient taking differently: Take one tablet by mouth every 8 hours as needed for pain. 06/10/17  Yes Biagio Borg, MD    Family History Family History  Problem Relation Age of Onset  . Hypertension Other     Social History Social History   Tobacco Use  . Smoking status: Never Smoker  . Smokeless tobacco: Never Used  Substance Use Topics  . Alcohol use: No  . Drug use: No     Allergies   Lipitor [atorvastatin]; Codeine; Lovastatin; and Pravastatin sodium   Review of Systems Review of Systems  Constitutional: Negative for chills and fever.  HENT: Negative for congestion and rhinorrhea.     Eyes: Negative for redness and visual disturbance.  Respiratory: Negative for shortness of breath and wheezing.   Cardiovascular: Negative for chest pain and palpitations.  Gastrointestinal: Negative for nausea and vomiting.  Genitourinary: Negative for dysuria and urgency.  Musculoskeletal: Negative for arthralgias and myalgias.  Skin: Negative for pallor and wound.  Neurological: Positive for syncope. Negative for dizziness and headaches.     Physical Exam Updated Vital Signs BP 125/62   Pulse 67   Temp 98.2 F (36.8 C) (Oral)   Resp (!) 21   SpO2 93%   Physical Exam  Constitutional: She is oriented to person, place, and time. She appears well-developed and well-nourished. No distress.  HENT:  Head: Normocephalic and atraumatic.  Eyes: Pupils are equal, round, and reactive to light. EOM are normal.  Neck: Normal range of motion. Neck supple.  Cardiovascular: Normal rate and regular rhythm. Exam reveals no gallop and no friction rub.  No murmur heard. Pulmonary/Chest: Effort normal. She has no wheezes. She has no rales.  Abdominal: Soft. She exhibits no distension and no mass. There is no tenderness. There is no guarding.  Musculoskeletal: She exhibits no edema or tenderness.  Neurological: She is alert and oriented to person, place, and time.  Skin: Skin is warm and dry. She is not diaphoretic.  Psychiatric: She has a normal mood and affect. Her behavior is normal.  Nursing note and vitals reviewed.    ED Treatments / Results  Labs (all labs ordered are listed, but only abnormal results are displayed) Labs Reviewed  CBC - Abnormal; Notable for the following components:      Result Value   RDW 18.0 (*)    All other components within normal limits  BASIC METABOLIC PANEL - Abnormal; Notable for the following components:   Glucose, Bld 149 (*)    GFR calc non Af Amer 53 (*)    All other components within normal limits  CBG MONITORING, ED - Abnormal; Notable for the  following components:   Glucose-Capillary 124 (*)    All other components within normal limits  I-STAT CHEM 8, ED - Abnormal; Notable for the following components:   Sodium 133 (*)    Potassium 8.2 (*)    Chloride 112 (*)    BUN 26 (*)    Glucose, Bld 129 (*)    Calcium, Ion 0.90 (*)    TCO2 19 (*)    All other components within normal limits  CBG MONITORING, ED  I-STAT TROPONIN, ED    EKG EKG Interpretation  Date/Time:  Tuesday August 10 2017 14:01:48 EDT Ventricular Rate:  65 PR Interval:  202 QRS Duration: 90 QT Interval:  410 QTC Calculation: 426 R Axis:   173 Text Interpretation:  Normal sinus rhythm Anterolateral infarct , age undetermined Abnormal ECG No significant change since last tracing Reconfirmed by Deno Etienne [phone]) on 08/10/2017 2:35:19 PM   Radiology No results found.  Procedures Procedures (including critical care time)  Medications Ordered in ED Medications  ondansetron (ZOFRAN-ODT) disintegrating tablet 4 mg (4 mg Oral Given 08/10/17 1501)     Initial Impression / Assessment and Plan / ED Course  I have reviewed the triage vital signs and the nursing notes.  Pertinent labs & imaging results that were available during my care of the patient were reviewed by me and considered in my medical decision making (see chart for details).     84 yo F with a chief complaint of a near syncopal event.  The patient was recently in the hospital for a similar presentation.  She has had another episode happened in the PCPs office and was sent home afterwards.  I will obtain a laboratory evaluation.  She had an i-STAT Chem-8 that is likely hemolyzed with a potassium of 8.2.  EKG shows no concerning findings for hyperkalemia and I will hold off on treatment at this time.    Repeat BMP is unremarkable.  Patient continues to be asymptomatic.  Discharge home.  PCP follow-up.  8:02 PM:  I have discussed the diagnosis/risks/treatment options with the patient and family  and believe the pt to be eligible for discharge home to follow-up with PCP. We also discussed returning to the ED immediately if new or worsening sx occur. We discussed the sx which are most concerning (e.g., sudden worsening pain, fever, inability to tolerate by mouth) that necessitate immediate return. Medications administered to the patient during their visit and any new prescriptions provided to the patient are listed below.  Medications given during this visit  Medications  ondansetron (ZOFRAN-ODT) disintegrating tablet 4 mg (4 mg Oral Given 08/10/17 1501)    Labs reviewed bmp with renal function at baseline.  No noted anemia  Old records reviewed recent admission for same without concerning echo  The patient appears reasonably screen and/or stabilized for discharge and I doubt any other medical condition or other [facility] requiring further screening, evaluation, or treatment in the ED at this time prior to discharge.    Final Clinical Impressions(s) / ED Diagnoses   Final diagnoses:  Syncope and collapse    ED Discharge Orders    None       Deno Etienne, DO 08/10/17 2002

## 2017-08-10 NOTE — ED Triage Notes (Signed)
Pt arrives from home via ems with reports of syncope X20 seconds with ams and NV. 4mg  zofran en route. Hx dementia, cva's with R defecits. At baseline per EMS.  BP 114/56, 58, RR 16, 98% RA, CBG 196.

## 2017-08-17 ENCOUNTER — Encounter: Payer: Self-pay | Admitting: Internal Medicine

## 2017-08-17 ENCOUNTER — Other Ambulatory Visit (INDEPENDENT_AMBULATORY_CARE_PROVIDER_SITE_OTHER): Payer: Medicare Other

## 2017-08-17 ENCOUNTER — Ambulatory Visit: Payer: Medicare Other | Admitting: Internal Medicine

## 2017-08-17 VITALS — BP 120/82 | HR 94 | Temp 98.2°F | Ht 62.0 in

## 2017-08-17 DIAGNOSIS — R001 Bradycardia, unspecified: Secondary | ICD-10-CM | POA: Diagnosis not present

## 2017-08-17 DIAGNOSIS — R7989 Other specified abnormal findings of blood chemistry: Secondary | ICD-10-CM

## 2017-08-17 DIAGNOSIS — R110 Nausea: Secondary | ICD-10-CM

## 2017-08-17 DIAGNOSIS — I10 Essential (primary) hypertension: Secondary | ICD-10-CM

## 2017-08-17 DIAGNOSIS — T50905A Adverse effect of unspecified drugs, medicaments and biological substances, initial encounter: Secondary | ICD-10-CM | POA: Diagnosis not present

## 2017-08-17 LAB — CBC WITH DIFFERENTIAL/PLATELET
Basophils Absolute: 0 10*3/uL (ref 0.0–0.1)
Basophils Relative: 0.8 % (ref 0.0–3.0)
Eosinophils Absolute: 0.2 10*3/uL (ref 0.0–0.7)
Eosinophils Relative: 4.8 % (ref 0.0–5.0)
HCT: 44.4 % (ref 36.0–46.0)
Hemoglobin: 14.1 g/dL (ref 12.0–15.0)
Lymphocytes Relative: 29.3 % (ref 12.0–46.0)
Lymphs Abs: 1.2 10*3/uL (ref 0.7–4.0)
MCHC: 31.7 g/dL (ref 30.0–36.0)
MCV: 84.8 fl (ref 78.0–100.0)
Monocytes Absolute: 0.5 10*3/uL (ref 0.1–1.0)
Monocytes Relative: 11.9 % (ref 3.0–12.0)
Neutro Abs: 2.1 10*3/uL (ref 1.4–7.7)
Neutrophils Relative %: 53.2 % (ref 43.0–77.0)
Platelets: 261 10*3/uL (ref 150.0–400.0)
RBC: 5.23 Mil/uL — ABNORMAL HIGH (ref 3.87–5.11)
RDW: 17.6 % — ABNORMAL HIGH (ref 11.5–15.5)
WBC: 4 10*3/uL (ref 4.0–10.5)

## 2017-08-17 LAB — BASIC METABOLIC PANEL
BUN: 17 mg/dL (ref 6–23)
CO2: 24 mEq/L (ref 19–32)
Calcium: 9.5 mg/dL (ref 8.4–10.5)
Chloride: 104 mEq/L (ref 96–112)
Creatinine, Ser: 0.87 mg/dL (ref 0.40–1.20)
GFR: 79.22 mL/min (ref >60.00)
Glucose, Bld: 100 mg/dL — ABNORMAL HIGH (ref 70–99)
Potassium: 4.2 mEq/L (ref 3.5–5.1)
Sodium: 138 mEq/L (ref 135–145)

## 2017-08-17 LAB — HEPATIC FUNCTION PANEL
ALT: 18 U/L (ref 0–35)
AST: 25 U/L (ref 0–37)
Albumin: 3.6 g/dL (ref 3.5–5.2)
Alkaline Phosphatase: 110 U/L (ref 39–117)
Bilirubin, Direct: 0.1 mg/dL (ref 0.0–0.3)
Total Bilirubin: 0.4 mg/dL (ref 0.2–1.2)
Total Protein: 7.4 g/dL (ref 6.0–8.3)

## 2017-08-17 LAB — TSH: TSH: 4.13 u[IU]/mL (ref 0.35–4.50)

## 2017-08-17 LAB — T4, FREE: Free T4: 0.68 ng/dL (ref 0.60–1.60)

## 2017-08-17 MED ORDER — ONDANSETRON 4 MG PO TBDP: 4.0000 mg | ORAL_TABLET | Freq: Three times a day (TID) | ORAL | 1 refills | Status: DC | PRN

## 2017-08-17 MED ORDER — ONDANSETRON HCL 4 MG/2ML IJ SOLN
2.0000 mg | Freq: Once | INTRAMUSCULAR | Status: AC
  Administered 2017-08-17: 2 mg via INTRAMUSCULAR

## 2017-08-17 NOTE — Progress Notes (Signed)
Subjective:    Patient ID: Heather [name], female    DOB: [birth year], 84 y.o.   MRN: [id number]  HPI  Here with daughter  After recent ED visit with recurring episodes monthly for last 4 months of nausea, regurgitation, cough and syncope.  Exam benign, labs ok and pt felt to be stable.  Denies hyper or hypo thyroid symptoms such as voice, skin or hair change.  Low HR not noted again after BB stopped last admit.  BP remains mild elevated in PM only. Denies worsening reflux, abd pain, dysphagia, n/v, bowel change or blood.  Pt denies fever, wt loss, night sweats, loss of appetite, or other constitutional symptoms Past Medical History:  Diagnosis Date  . ALLERGIC RHINITIS 12/09/2006  . DEGENERATIVE JOINT DISEASE, LEFT KNEE 01/23/2007  . Encephalocele ([place]) 01/23/2007  . FEVER UNSPECIFIED 01/28/2010  . GERD 12/09/2006  . GLUCOSE INTOLERANCE 01/24/2009  . GOUT 12/09/2006  . HYPERCHOLESTEROLEMIA 12/09/2006  . HYPERLIPIDEMIA 12/18/2006  . HYPERTENSION 12/09/2006  . Impaired glucose tolerance 07/26/2010  . MENOPAUSAL DISORDER 01/25/2008  . Stroke [facility])    Past Surgical History:  Procedure Laterality Date  . s/p anterior encephalocele repair and craniotomy  2003    reports that she has never smoked. She has never used smokeless tobacco. She reports that she does not drink alcohol or use drugs. family history includes Hypertension in her other. Allergies  Allergen Reactions  . Lipitor [Atorvastatin] Other (See Comments)    myalgia  . Codeine Other (See Comments)  . Lovastatin Other (See Comments)  . Pravastatin Sodium Other (See Comments)   Current Outpatient Medications on File Prior to Visit  Medication Sig Dispense Refill  . acetaminophen (TYLENOL) 500 MG tablet Take 500 mg by mouth every 6 (six) hours as needed for moderate pain (knee, leg, stomach).    Marland Kitchen amLODipine (NORVASC) 10 MG tablet Take 1 tablet (10 mg total) by mouth daily. 30 tablet 0  . benazepril (LOTENSIN) 40 MG tablet TAKE ONE  TABLET BY MOUTH ONCE DAILY 90 tablet 3  . buPROPion (WELLBUTRIN) 75 MG tablet Take 1 tablet at bedtime (Patient taking differently: Take 75 mg by mouth at bedtime. ) 30 tablet 6  . donepezil (ARICEPT) 10 MG tablet Take 1 tablet daily (Patient taking differently: Take 10 mg by mouth at bedtime. ) 90 tablet 3  . levothyroxine (SYNTHROID, LEVOTHROID) 25 MCG tablet Take 0.5 tablets (12.5 mcg total) by mouth daily before breakfast. 30 tablet 0  . pantoprazole (PROTONIX) 40 MG tablet TAKE 1 TABLET BY MOUTH  DAILY 30 tablet 3  . QUEtiapine (SEROQUEL) 50 MG tablet 1 tab by mouth in the AM (Patient taking differently: Take 50 mg by mouth daily. ) 90 tablet 3  . rivaroxaban (XARELTO) 10 MG TABS tablet Take 1 tablet (10 mg total) daily by mouth. (Patient taking differently: Take 10 mg by mouth every evening. ) 30 tablet 5  . traMADol (ULTRAM) 50 MG tablet TAKE 1 TABLET BY MOUTH EVERY 8 HOURS AS NEEDED (Patient taking differently: Take one tablet by mouth every 8 hours as needed for pain.) 90 tablet 2   No current facility-administered medications on file prior to visit.    Review of Systems  Constitutional: Negative for other unusual diaphoresis or sweats HENT: Negative for ear discharge or swelling Eyes: Negative for other worsening visual disturbances Respiratory: Negative for stridor or other swelling  Gastrointestinal: Negative for worsening distension or other blood Genitourinary: Negative for retention or other urinary change Musculoskeletal: Negative  for other MSK pain or swelling Skin: Negative for color change or other new lesions Neurological: Negative for worsening tremors and other numbness  Psychiatric/Behavioral: Negative for worsening agitation or other fatigue All other system neg per pt    Objective:   Physical Exam BP 120/82   Pulse 94   Temp 98.2 F (36.8 C) (Oral)   Ht 5\' 2"  (1.575 m)   SpO2 97%   BMI 37.99 kg/m  VS noted,  Constitutional: Pt appears in NAD HENT: Head:  NCAT.  Right Ear: External ear normal.  Left Ear: External ear normal.  Eyes: . Pupils are equal, round, and reactive to light. Conjunctivae and EOM are normal Nose: without d/c or deformity Neck: Neck supple. Gross normal ROM Cardiovascular: Normal rate and regular rhythm.   Pulmonary/Chest: Effort normal and breath sounds without rales or wheezing.  Abd:  Soft, NT, ND, + BS, no organomegaly Neurological: Pt is alert. At baseline orientation, motor grossly intact Skin: Skin is warm. No rashes, other new lesions, no LE edema Psychiatric: Pt behavior is normal without agitation  No other exam findings    Assessment & Plan:

## 2017-08-17 NOTE — Patient Instructions (Addendum)
You had the nausea shot today  Please take all new medication as prescribed - the zofran for nausea at home  Please call if you change your mind about the referral to Gastroenterology  Please continue all other medications as before, and refills have been done if requested.  Please have the pharmacy call with any other refills you may need.  Please keep your appointments with your specialists as you may have planned  Please go to the LAB in the Basement (turn left off the elevator) for the tests to be done today  You will be contacted by phone if any changes need to be made immediately.  Otherwise, you will receive a letter about your results with an explanation, but please check with MyChart first.  Please remember to sign up for MyChart if you have not done so, as this will be important to you in the future with finding out test results, communicating by private email, and scheduling acute appointments online when needed.  Please return in 6 months, or sooner if needed

## 2017-08-17 NOTE — Assessment & Plan Note (Signed)
With last hospn, for f/u TFT's today

## 2017-08-17 NOTE — Assessment & Plan Note (Signed)
Recurring, likely leading to vasovagal syncope several times recently, to consider PPI, declines GI referral for now

## 2017-08-17 NOTE — Assessment & Plan Note (Signed)
D/w daughter, ok to cont same tx,  to f/u any worsening symptoms or concerns

## 2017-08-17 NOTE — Assessment & Plan Note (Signed)
Resolved,  to f/u any worsening symptoms or concerns  

## 2017-08-26 ENCOUNTER — Telehealth: Payer: Self-pay

## 2017-08-26 MED ORDER — ONDANSETRON HCL 4 MG PO TABS: 4.0000 mg | ORAL_TABLET | Freq: Three times a day (TID) | ORAL | 1 refills | Status: DC | PRN

## 2017-08-26 NOTE — Addendum Note (Signed)
Addended by: Biagio Borg on: 08/26/2017 12:29 PM   Modules accepted: Orders

## 2017-08-26 NOTE — Telephone Encounter (Signed)
Copied from [place]. Topic: General - Other >> Aug 26, 2017  8:42 AM Oneta Rack wrote: Relation to pt: Heywood Iles / daughter Call back [place]: [place], [place] - [address] [phone] (Phone) [phone] (Fax)  Reason for call:  Daughter states insurance will not cover ondansetron (ZOFRAN ODT) 4 MG disintegrating tablet, cost is over a $100, daughter seeking an alternate, please advise >> Aug 26, 2017  8:45 AM Oneta Rack wrote: Relation to pt: Heywood Iles / daughter Call back [facility]: [place], [place] - [address] [phone] (Phone) [phone] (Fax)  Reason for call:  Daughter states insurance will not cover ondansetron (ZOFRAN ODT) 4 MG disintegrating tablet, cost is over a $100, daughter seeking an alternate, please advise

## 2017-08-26 NOTE — Telephone Encounter (Signed)
Ok to change the ODT zofran to the regular zofran (to swallow pills instead of melting in the mouth)

## 2017-08-27 DIAGNOSIS — I2699 Other pulmonary embolism without acute cor pulmonale: Secondary | ICD-10-CM | POA: Diagnosis not present

## 2017-08-27 DIAGNOSIS — J9601 Acute respiratory failure with hypoxia: Secondary | ICD-10-CM | POA: Diagnosis not present

## 2017-08-27 DIAGNOSIS — I5032 Chronic diastolic (congestive) heart failure: Secondary | ICD-10-CM | POA: Diagnosis not present

## 2017-08-31 ENCOUNTER — Other Ambulatory Visit: Payer: Self-pay | Admitting: Internal Medicine

## 2017-08-31 NOTE — Telephone Encounter (Signed)
Requesting Dr. Jenny Reichmann refill the medication.

## 2017-09-10 ENCOUNTER — Ambulatory Visit (INDEPENDENT_AMBULATORY_CARE_PROVIDER_SITE_OTHER): Payer: Medicare Other | Admitting: Neurology

## 2017-09-10 ENCOUNTER — Encounter: Payer: Self-pay | Admitting: Neurology

## 2017-09-10 ENCOUNTER — Other Ambulatory Visit: Payer: Self-pay

## 2017-09-10 VITALS — BP 138/84 | HR 72 | Ht 62.0 in | Wt 210.0 lb

## 2017-09-10 DIAGNOSIS — F0390 Unspecified dementia without behavioral disturbance: Secondary | ICD-10-CM | POA: Diagnosis not present

## 2017-09-10 DIAGNOSIS — F03A0 Unspecified dementia, mild, without behavioral disturbance, psychotic disturbance, mood disturbance, and anxiety: Secondary | ICD-10-CM

## 2017-09-10 MED ORDER — QUETIAPINE FUMARATE 50 MG PO TABS: ORAL_TABLET | ORAL | 3 refills | Status: DC

## 2017-09-10 MED ORDER — DONEPEZIL HCL 10 MG PO TABS: ORAL_TABLET | ORAL | 3 refills | Status: DC

## 2017-09-10 NOTE — Progress Notes (Signed)
NEUROLOGY FOLLOW UP OFFICE NOTE  Heather [name] [id number] October 14, 1928  HISTORY OF PRESENT ILLNESS: I had the pleasure of seeing Heather [name] in follow-up in the neurology clinic on 09/10/2017.  The patient was last seen 6 months ago for mild dementia and is again accompanied by her daughter who helps supplement the history today.  Records and images were personally reviewed where available. MMSE in November 2018 was 24/30 (19/30 in November 2017). She is taking Aricept 10mg  daily without side effects. She is also on Seroquel for behavioral changes. On her last visit, she was started on Wellbutrin for crying spells. She had an episode of decreased responsiveness on her last visit in November 2018. These have recurred multiple times, most recently on 08/10/17 while at [facility] after her typical meal. She was admitted the month prior for another similar episode and beta-blocker was stopped due to bradycardia. Daughter reports SBP runs between 125-150. Memory continues to decline. She is very impatient, ready to go to bed as soon as she eats. Once she is in bed, she is ready to get up after 10 minutes. She wants to get up 10x in an 8 hour period (during the daytime). She is better at night with the Seroquel, waking up 4 times at night.   HPI 03/09/2016: This is an 84 yo RH woman with a history of hypertension, hyperlipidemia, encephalocele s/p surgery in 2003, with memory loss and stroke in October 2017. She had seen her PCP on 01/01/16 for memory problems. An MRI brain was ordered, however while waiting for the scan, she started having shortness of breath and was admitted 01/24/16 and found to have DVTs in both lower extremities. She was also complaining of generalized weakness with difficulty ambulating. She was discharged home on Xarelto and home PT. She went for outpatient previously scheduled MRI brain on 02/03/16, which I personally reviewed, showing an acute/subacute small left periatrial infarct  without associated hemorrhage. There was a remote moderate-sized right parietal lobe infarct. There was marked chronic microvascular change. Remote frontal craniotomy for repair of anterior encephalocele was seen, with significant surrounding encephalomalacia. There was moderate global atrophy without hydrocephalus. She was instructed to go to the ER, but since she had recent echocardiogram and was started on Xarelto, and had no clear deficits on exam, and she was discharged home with outpatient follow-up.   She reports that her memory has been "on and off" for conversations. She can hold a conversation then just "slacks off." Her daughter has noticed this and feels that she loses interest, cutting off conversations. She endorses some word-finding difficulties. Her daughter states that sometimes it is hard to say about her memory because of her "quirky sense of humor." For instance, this morning she said she did not want breakfast. After her daughter came home, she told her she was hungry, so her daughter fixed food. Once made, she stated she did not want food. She only repeats herself when they are having conversations in the bathroom at 3am. She had previously been living with her husband until hospital admission, and has been living with her daughter for the past 2 months. She denied any missed bill payments when she was living with her husband. She is pretty good with remembering to take her morning medications. She stopped driving due to mobility issues 3 years ago. She has been using a wheelchair since recent hospital discharge, however her daughter reports that for the past 2 years, she has not walked a lot because  of bilateral leg weakness. She would use a walker and hold on to furniture. She feels her left arm and leg are weaker. She has occasional tingling in both hands. She needs assistance with dressing and bathing. Her daughter reports a couple of falls in the past 2 years, she would tell her  daughter about them 1-2 weeks later. She denies any prior history of stroke. She denies any headaches, dizziness, diplopia, dysarthria/dysphagia. No personality changes or hallucinations. She is having some constipation, but her bigger issue is urinary urgency. She has the urge to urinate 4 to 6 times an hour at night, but once she sits on the commode, she does not urinate. During the day, she has the urge around 3 times an hour.   PAST MEDICAL HISTORY: Past Medical History:  Diagnosis Date  . ALLERGIC RHINITIS 12/09/2006  . DEGENERATIVE JOINT DISEASE, LEFT KNEE 01/23/2007  . Encephalocele ([place]) 01/23/2007  . FEVER UNSPECIFIED 01/28/2010  . GERD 12/09/2006  . GLUCOSE INTOLERANCE 01/24/2009  . GOUT 12/09/2006  . HYPERCHOLESTEROLEMIA 12/09/2006  . HYPERLIPIDEMIA 12/18/2006  . HYPERTENSION 12/09/2006  . Impaired glucose tolerance 07/26/2010  . MENOPAUSAL DISORDER 01/25/2008  . Stroke [facility])     MEDICATIONS: Current Outpatient Medications on File Prior to Visit  Medication Sig Dispense Refill  . acetaminophen (TYLENOL) 500 MG tablet Take 500 mg by mouth every 6 (six) hours as needed for moderate pain (knee, leg, stomach).    Marland Kitchen amLODipine (NORVASC) 10 MG tablet Take 1 tablet (10 mg total) by mouth daily. 30 tablet 0  . benazepril (LOTENSIN) 40 MG tablet TAKE ONE TABLET BY MOUTH ONCE DAILY 90 tablet 3  . buPROPion (WELLBUTRIN) 75 MG tablet Take 1 tablet at bedtime (Patient taking differently: Take 75 mg by mouth at bedtime. ) 30 tablet 6  . donepezil (ARICEPT) 10 MG tablet Take 1 tablet daily (Patient taking differently: Take 10 mg by mouth at bedtime. ) 90 tablet 3  . levothyroxine (SYNTHROID, LEVOTHROID) 25 MCG tablet Take 0.5 tablets (12.5 mcg total) by mouth daily before breakfast. 30 tablet 0  . ondansetron (ZOFRAN ODT) 4 MG disintegrating tablet Take 1 tablet (4 mg total) by mouth every 8 (eight) hours as needed for nausea or vomiting. 30 tablet 1  . ondansetron (ZOFRAN) 4 MG tablet Take 1  tablet (4 mg total) by mouth every 8 (eight) hours as needed for nausea or vomiting. 30 tablet 1  . pantoprazole (PROTONIX) 40 MG tablet TAKE 1 TABLET BY MOUTH  DAILY 30 tablet 3  . QUEtiapine (SEROQUEL) 50 MG tablet 1 tab by mouth in the AM (Patient taking differently: Take 50 mg by mouth daily. ) 90 tablet 3  . traMADol (ULTRAM) 50 MG tablet TAKE 1 TABLET BY MOUTH EVERY 8 HOURS AS NEEDED (Patient taking differently: Take one tablet by mouth every 8 hours as needed for pain.) 90 tablet 2  . XARELTO 10 MG TABS tablet TAKE 1 TABLET BY MOUTH ONCE DAILY 30 tablet 5   No current facility-administered medications on file prior to visit.     ALLERGIES: Allergies  Allergen Reactions  . Lipitor [Atorvastatin] Other (See Comments)    myalgia  . Codeine Other (See Comments)  . Lovastatin Other (See Comments)  . Pravastatin Sodium Other (See Comments)    FAMILY HISTORY: Family History  Problem Relation Age of Onset  . Hypertension Other     SOCIAL HISTORY: Social History   Socioeconomic History  . Marital status: Married    Spouse name: Not  on file  . Number of children: 7  . Years of education: Not on file  . Highest education level: Not on file  Occupational History  . Occupation: Musician  Social Needs  . Financial resource strain: Not on file  . Food insecurity:    Worry: Not on file    Inability: Not on file  . Transportation needs:    Medical: Not on file    Non-medical: Not on file  Tobacco Use  . Smoking status: Never Smoker  . Smokeless tobacco: Never Used  Substance and Sexual Activity  . Alcohol use: No  . Drug use: No  . Sexual activity: Never  Lifestyle  . Physical activity:    Days per week: Not on file    Minutes per session: Not on file  . Stress: Not on file  Relationships  . Social connections:    Talks on phone: Not on file    Gets together: Not on file    Attends religious service: Not on file    Active member of club or  organization: Not on file    Attends meetings of clubs or organizations: Not on file    Relationship status: Not on file  . Intimate partner violence:    Fear of current or ex partner: Not on file    Emotionally abused: Not on file    Physically abused: Not on file    Forced sexual activity: Not on file  Other Topics Concern  . Not on file  Social History Narrative  . Not on file    REVIEW OF SYSTEMS: Constitutional: No fevers, chills, or sweats, no generalized fatigue, change in appetite Eyes: No visual changes, double vision, eye pain Ear, nose and throat: No hearing loss, ear pain, nasal congestion, sore throat Cardiovascular: No chest pain, palpitations Respiratory:  No shortness of breath at rest or with exertion, wheezes GastrointestinaI: No nausea, vomiting, diarrhea, abdominal pain, fecal incontinence Genitourinary:  No dysuria, urinary retention or frequency. +urgency Musculoskeletal:  No neck pain, back pain Integumentary: No rash, pruritus, skin lesions Neurological: as above Psychiatric: No depression, insomnia, anxiety Endocrine: No palpitations, fatigue, diaphoresis, mood swings, change in appetite, change in weight, increased thirst Hematologic/Lymphatic:  No anemia, purpura, petechiae. Allergic/Immunologic: no itchy/runny eyes, nasal congestion, recent allergic reactions, rashes  PHYSICAL EXAM: Vitals:   09/10/17 1542  BP: 138/84  Pulse: 72  SpO2: 96%   General: No acute distress, sitting in wheelchair Head:  Normocephalic/atraumatic Neck: supple, no paraspinal tenderness, full range of motion Heart:  Regular rate and rhythm Lungs:  Clear to auscultation bilaterally Back: No paraspinal tenderness Skin/Extremities: No rash, no edema Neurological Exam: alert and oriented to person, place, states it is January 29, 2019. No aphasia or dysarthria. Fund of knowledge is reduced. Recent and remote memory are impaired. 3/3 delayed recall. Attention and concentration  are reduced 1/5 WORLD backwards ("DORLD"). Able to name objects and repeat phrases. Cranial nerves: Pupils equal, round, reactive to light.  Extraocular movements intact with no nystagmus. Visual fields full. Facial sensation intact. No facial asymmetry. Tongue, uvula, palate midline.  Motor: Bulk and tone normal, muscle strength 5/5 throughout with no pronator drift.  Sensation to light touch intact.  No extinction to double simultaneous stimulation.  Deep tendon reflexes +1 throughout, toes downgoing.  Finger to nose testing intact.  Gait not tested, sitting on wheelchair.  IMPRESSION: This is an 84 yo RH woman with a history of hypertension, hyperlipidemia, encephalocele s/p surgery in 2003,  subcortical stroke in October 2017, with mild dementia with behavioral disturbance. Continue Aricept 10mg  daily. Continue Wellbutrin for mood. Her daughter is interested in reducing Seroquel, she will try 1/2 tab qhs. They were encouraged to start looking into day programs, we discussed the importance of control of vascular risk factors, physical exercise, and brain stimulation exercises for brain health. She is on Xarelto for DVTs. Continue control of vascular risk factors for secondary stroke prevention. Follow-up in 6 months, call for any changes.   Thank you for allowing me to participate in her care.  Please do not hesitate to call for any questions or concerns.  The duration of this appointment visit was 25 minutes of face-to-face time with the patient.  Greater than 50% of this time was spent in counseling, explanation of diagnosis, planning of further management, and coordination of care.   Heather [name], M.D.   CC: Dr. Jenny Reichmann

## 2017-09-10 NOTE — Patient Instructions (Signed)
Try reducing Seroquel 50mg , take 1/2 tablet at night. If no change, continue on lower dose. Go back on higher dose if more behavioral issues. Continue Donepezil 10mg  daily. Start looking into day programs to help with brain stimulation during the daytime. Follow-up in 6 months, call for any changes.

## 2017-09-27 DIAGNOSIS — I2699 Other pulmonary embolism without acute cor pulmonale: Secondary | ICD-10-CM | POA: Diagnosis not present

## 2017-09-27 DIAGNOSIS — J9601 Acute respiratory failure with hypoxia: Secondary | ICD-10-CM | POA: Diagnosis not present

## 2017-09-27 DIAGNOSIS — I5032 Chronic diastolic (congestive) heart failure: Secondary | ICD-10-CM | POA: Diagnosis not present

## 2017-10-19 ENCOUNTER — Other Ambulatory Visit: Payer: Self-pay | Admitting: Internal Medicine

## 2017-10-19 ENCOUNTER — Other Ambulatory Visit: Payer: Self-pay | Admitting: Neurology

## 2017-10-19 DIAGNOSIS — F320 Major depressive disorder, single episode, mild: Secondary | ICD-10-CM

## 2017-10-27 DIAGNOSIS — I5032 Chronic diastolic (congestive) heart failure: Secondary | ICD-10-CM | POA: Diagnosis not present

## 2017-10-27 DIAGNOSIS — I2699 Other pulmonary embolism without acute cor pulmonale: Secondary | ICD-10-CM | POA: Diagnosis not present

## 2017-10-27 DIAGNOSIS — J9601 Acute respiratory failure with hypoxia: Secondary | ICD-10-CM | POA: Diagnosis not present

## 2017-11-13 ENCOUNTER — Other Ambulatory Visit: Payer: Self-pay | Admitting: Internal Medicine

## 2017-11-21 ENCOUNTER — Other Ambulatory Visit: Payer: Self-pay | Admitting: Internal Medicine

## 2017-11-27 DIAGNOSIS — I5032 Chronic diastolic (congestive) heart failure: Secondary | ICD-10-CM | POA: Diagnosis not present

## 2017-11-27 DIAGNOSIS — J9601 Acute respiratory failure with hypoxia: Secondary | ICD-10-CM | POA: Diagnosis not present

## 2017-11-27 DIAGNOSIS — I2699 Other pulmonary embolism without acute cor pulmonale: Secondary | ICD-10-CM | POA: Diagnosis not present

## 2017-12-28 DIAGNOSIS — J9601 Acute respiratory failure with hypoxia: Secondary | ICD-10-CM | POA: Diagnosis not present

## 2017-12-28 DIAGNOSIS — I2699 Other pulmonary embolism without acute cor pulmonale: Secondary | ICD-10-CM | POA: Diagnosis not present

## 2017-12-28 DIAGNOSIS — I5032 Chronic diastolic (congestive) heart failure: Secondary | ICD-10-CM | POA: Diagnosis not present

## 2018-01-11 ENCOUNTER — Ambulatory Visit: Payer: Medicare Other

## 2018-01-18 ENCOUNTER — Ambulatory Visit: Payer: Self-pay | Admitting: Internal Medicine

## 2018-01-18 NOTE — Telephone Encounter (Signed)
Pt's daughter calling staing thet her mother is acting more fatigued and resisting getting out of the bed. This began 1 week ago. When pt doesn't want to be disturbed she will say, "I'm tired" or "i'm asleep." Pt began having weakness for a year but has worsened in the past week. Her daughter feels that her mohters inactivity or activity intolerance is contributing to her fatigue. Her daughter stated that all her mother wants to do is lay in the bed. Pt denies (daughter) any c/o chest pain, fever, cough, shortness of breath, vomiting, diarrhea,bleeding.  Pt has appointment in place  (must use [facility] transportation) 01/20/18 to see PCP. Advised daughter to check pt's BP before and after getting pt out of the bed. Advised giving her mother some extra fluids before her appointment. Pt has h/o fainting after doctor's visits during the ride home from the visits. Advised daughter to notify PCP or call 911 with any signs of worsening, chest pain or sudden changes in mental status.  Reason for Disposition . [1] MODERATE weakness (i.e., interferes with work, school, normal activities) AND [2] persists > 3 days  Answer Assessment - Initial Assessment Questions 1. DESCRIPTION: "Describe how you are feeling."    "Im tired and "Im asleep" 2. SEVERITY: "How bad is it?"  "Can you stand and walk?"   - MILD - Feels weak or tired, but does not interfere with work, school or normal activities   - [place] to stand and walk; weakness interferes with work, school, or normal activities   - SEVERE - Unable to stand or walk     Severe hasnt stood in over a year 3. ONSET:  "When did the weakness begin?"     1 year ago 4. CAUSE: "What do you think is causing the weakness?"     Inactivity- thinks pt has just decided that she will not walk anymore or fear of falling and feels insecure away from home 5. MEDICINES: "Have you recently started a new medicine or had a change in the amount of a medicine?"     no 6. OTHER  SYMPTOMS: "Do you have any other symptoms?" (e.g., chest pain, fever, cough, SOB, vomiting, diarrhea, bleeding, other areas of pain)     none 7. PREGNANCY: "Is there any chance you are pregnant?" "When was your last menstrual period?"     n/a  Protocols used: WEAKNESS (GENERALIZED) AND FATIGUE-A-AH

## 2018-01-20 ENCOUNTER — Ambulatory Visit: Payer: Medicare Other | Admitting: Internal Medicine

## 2018-01-27 DIAGNOSIS — I2699 Other pulmonary embolism without acute cor pulmonale: Secondary | ICD-10-CM | POA: Diagnosis not present

## 2018-01-27 DIAGNOSIS — J9601 Acute respiratory failure with hypoxia: Secondary | ICD-10-CM | POA: Diagnosis not present

## 2018-01-27 DIAGNOSIS — I5032 Chronic diastolic (congestive) heart failure: Secondary | ICD-10-CM | POA: Diagnosis not present

## 2018-02-17 ENCOUNTER — Ambulatory Visit: Payer: Medicare Other | Admitting: Internal Medicine

## 2018-02-17 ENCOUNTER — Encounter: Payer: Self-pay | Admitting: Internal Medicine

## 2018-02-17 VITALS — BP 128/86 | HR 68 | Temp 98.5°F | Ht 62.0 in

## 2018-02-17 DIAGNOSIS — Z23 Encounter for immunization: Secondary | ICD-10-CM | POA: Diagnosis not present

## 2018-02-17 DIAGNOSIS — Z0000 Encounter for general adult medical examination without abnormal findings: Secondary | ICD-10-CM | POA: Diagnosis not present

## 2018-02-17 MED ORDER — APIXABAN 2.5 MG PO TABS: 2.5000 mg | ORAL_TABLET | Freq: Two times a day (BID) | ORAL | 11 refills | Status: DC

## 2018-02-17 NOTE — Progress Notes (Signed)
Subjective:    Patient ID: Heather [name], female    DOB: [birth year], 84 y.o.   MRN: [id number]  HPI  Here for wellness and f/u with daughter;  Overall doing ok;  Pt denies Chest pain, worsening SOB, DOE, wheezing, orthopnea, PND, worsening LE edema, palpitations, dizziness or syncope.  Pt denies neurological change such as new headache, facial or extremity weakness.  Pt denies polydipsia, polyuria, or low sugar symptoms. Pt states overall good compliance with treatment and medications, good tolerability, and has been trying to follow appropriate diet.  Pt denies worsening depressive symptoms, suicidal ideation or panic. No fever, night sweats, wt loss, loss of appetite, or other constitutional symptoms.  Pt states good ability with ADL's, has low fall risk, home safety reviewed and adequate, no other significant changes in hearing or vision, and Wheelchair bound for 1 yr, no falls, and has not walked in 1 yr Past Medical History:  Diagnosis Date  . ALLERGIC RHINITIS 12/09/2006  . DEGENERATIVE JOINT DISEASE, LEFT KNEE 01/23/2007  . Encephalocele ([place]) 01/23/2007  . FEVER UNSPECIFIED 01/28/2010  . GERD 12/09/2006  . GLUCOSE INTOLERANCE 01/24/2009  . GOUT 12/09/2006  . HYPERCHOLESTEROLEMIA 12/09/2006  . HYPERLIPIDEMIA 12/18/2006  . HYPERTENSION 12/09/2006  . Impaired glucose tolerance 07/26/2010  . MENOPAUSAL DISORDER 01/25/2008  . Stroke [facility])    Past Surgical History:  Procedure Laterality Date  . s/p anterior encephalocele repair and craniotomy  2003    reports that she has never smoked. She has never used smokeless tobacco. She reports that she does not drink alcohol or use drugs. family history includes Hypertension in her other. Allergies  Allergen Reactions  . Lipitor [Atorvastatin] Other (See Comments)    myalgia  . Codeine Other (See Comments)  . Lovastatin Other (See Comments)  . Pravastatin Sodium Other (See Comments)   Current Outpatient Medications on File Prior to Visit    Medication Sig Dispense Refill  . acetaminophen (TYLENOL) 500 MG tablet Take 500 mg by mouth every 6 (six) hours as needed for moderate pain (knee, leg, stomach).    Marland Kitchen amLODipine (NORVASC) 10 MG tablet Take 1 tablet (10 mg total) by mouth daily. 30 tablet 0  . amLODipine (NORVASC) 5 MG tablet TAKE 1 TABLET BY MOUTH ONCE DAILY 90 tablet 1  . benazepril (LOTENSIN) 40 MG tablet TAKE ONE TABLET BY MOUTH ONCE DAILY 90 tablet 3  . buPROPion (WELLBUTRIN) 75 MG tablet TAKE 1 TABLET BY MOUTH AT BEDTIME 90 tablet 3  . donepezil (ARICEPT) 10 MG tablet Take 1 tablet daily 90 tablet 3  . levothyroxine (SYNTHROID, LEVOTHROID) 25 MCG tablet Take 0.5 tablets (12.5 mcg total) by mouth daily before breakfast. 30 tablet 0  . nystatin (NYSTATIN) powder Use as directed twice per day as needed for itching 45 g 5  . ondansetron (ZOFRAN ODT) 4 MG disintegrating tablet Take 1 tablet (4 mg total) by mouth every 8 (eight) hours as needed for nausea or vomiting. 30 tablet 1  . ondansetron (ZOFRAN) 4 MG tablet Take 1 tablet (4 mg total) by mouth every 8 (eight) hours as needed for nausea or vomiting. 30 tablet 1  . pantoprazole (PROTONIX) 40 MG tablet TAKE 1 TABLET BY MOUTH ONCE DAILY 90 tablet 1  . QUEtiapine (SEROQUEL) 50 MG tablet Take 1 tablet daily 90 tablet 3  . traMADol (ULTRAM) 50 MG tablet TAKE 1 TABLET BY MOUTH EVERY 8 HOURS AS NEEDED (Patient taking differently: Take one tablet by mouth every 8 hours as needed for  pain.) 90 tablet 2  . XARELTO 10 MG TABS tablet TAKE 1 TABLET BY MOUTH ONCE DAILY 30 tablet 5   No current facility-administered medications on file prior to visit.   ROS: Unable due to dementia, daughter reports no other issues    Objective:   Physical Exam BP 128/86   Pulse 68   Temp 98.5 F (36.9 C) (Oral)   Ht 5\' 2"  (1.575 m)   SpO2 92%   BMI 38.41 kg/m  VS noted, obese Constitutional: Pt is oriented to person, place, and time. Appears well-developed and well-nourished, in no significant  distress and comfortable Head: Normocephalic and atraumatic  Eyes: Conjunctivae and EOM are normal. Pupils are equal, round, and reactive to light Right Ear: External ear normal without discharge Left Ear: External ear normal without discharge Nose: Nose without discharge or deformity Mouth/Throat: Oropharynx is without other ulcerations and moist  Neck: Normal range of motion. Neck supple. No JVD present. No tracheal deviation present or significant neck LA or mass Cardiovascular: Normal rate, regular rhythm, normal heart sounds and intact distal pulses.   Pulmonary/Chest: WOB normal and breath sounds without rales or wheezing  Abdominal: Soft. Bowel sounds are normal. NT. No HSM  Musculoskeletal: Normal range of motion. Exhibits no edema Lymphadenopathy: Has no other cervical adenopathy.  Neurological: Pt is alert and oriented to person only. Pt has normal reflexes. No cranial nerve deficit. Motor grossly intact, Gait intact Skin: Skin is warm and dry. No rash noted or new ulcerations Psychiatric:  Has normal mood and affect. Behavior is normal without agitation No other exam findings  Lab Results  Component Value Date   WBC 4.0 08/17/2017   HGB 14.1 08/17/2017   HCT 44.4 08/17/2017   PLT 261.0 08/17/2017   GLUCOSE 100 (H) 08/17/2017   CHOL 212 (H) 12/15/2016   TRIG 189.0 (H) 12/15/2016   HDL 31.80 (L) 12/15/2016   LDLDIRECT 139.0 01/01/2016   LDLCALC 142 (H) 12/15/2016   ALT 18 08/17/2017   AST 25 08/17/2017   NA 138 08/17/2017   K 4.2 08/17/2017   CL 104 08/17/2017   CREATININE 0.87 08/17/2017   BUN 17 08/17/2017   CO2 24 08/17/2017   TSH 4.13 08/17/2017   INR 2.43 01/25/2017   HGBA1C 6.4 12/15/2016   Decline sfurther lab today    Assessment & Plan:

## 2018-02-17 NOTE — Patient Instructions (Addendum)
You had the flu shot today  OK to give the Eliquis prescription to see if less expensive;  If you have this filled, you would stop the Xarelto and let us know that you are taking it  Please continue all other medications as before, and refills have been done if requested.  Please have the pharmacy call with any other refills you may need.  Please continue your efforts at being more active, low cholesterol diet, and weight control.  You are otherwise up to date with prevention measures today.  Please keep your appointments with your specialists as you may have planned  Please return in 6 months, or sooner if needed

## 2018-02-17 NOTE — Assessment & Plan Note (Signed)

## 2018-02-27 DIAGNOSIS — I5032 Chronic diastolic (congestive) heart failure: Secondary | ICD-10-CM | POA: Diagnosis not present

## 2018-02-27 DIAGNOSIS — J9601 Acute respiratory failure with hypoxia: Secondary | ICD-10-CM | POA: Diagnosis not present

## 2018-02-27 DIAGNOSIS — I2699 Other pulmonary embolism without acute cor pulmonale: Secondary | ICD-10-CM | POA: Diagnosis not present

## 2018-03-12 ENCOUNTER — Other Ambulatory Visit: Payer: Self-pay | Admitting: Internal Medicine

## 2018-03-29 DIAGNOSIS — I5032 Chronic diastolic (congestive) heart failure: Secondary | ICD-10-CM | POA: Diagnosis not present

## 2018-03-29 DIAGNOSIS — J9601 Acute respiratory failure with hypoxia: Secondary | ICD-10-CM | POA: Diagnosis not present

## 2018-03-29 DIAGNOSIS — I2699 Other pulmonary embolism without acute cor pulmonale: Secondary | ICD-10-CM | POA: Diagnosis not present

## 2018-04-02 ENCOUNTER — Other Ambulatory Visit: Payer: Self-pay | Admitting: Internal Medicine

## 2018-04-08 ENCOUNTER — Other Ambulatory Visit: Payer: Self-pay | Admitting: Internal Medicine

## 2018-04-11 NOTE — Telephone Encounter (Signed)
   LOV:02/17/18 NextOV:08/18/18  Last Filled/Quantity:06/10/17 90#

## 2018-04-22 ENCOUNTER — Ambulatory Visit: Payer: Medicare Other | Admitting: Neurology

## 2018-04-29 DIAGNOSIS — I5032 Chronic diastolic (congestive) heart failure: Secondary | ICD-10-CM | POA: Diagnosis not present

## 2018-04-29 DIAGNOSIS — J9601 Acute respiratory failure with hypoxia: Secondary | ICD-10-CM | POA: Diagnosis not present

## 2018-04-29 DIAGNOSIS — I2699 Other pulmonary embolism without acute cor pulmonale: Secondary | ICD-10-CM | POA: Diagnosis not present

## 2018-05-30 DIAGNOSIS — I2699 Other pulmonary embolism without acute cor pulmonale: Secondary | ICD-10-CM | POA: Diagnosis not present

## 2018-05-30 DIAGNOSIS — I5032 Chronic diastolic (congestive) heart failure: Secondary | ICD-10-CM | POA: Diagnosis not present

## 2018-05-30 DIAGNOSIS — J9601 Acute respiratory failure with hypoxia: Secondary | ICD-10-CM | POA: Diagnosis not present

## 2018-06-05 ENCOUNTER — Other Ambulatory Visit: Payer: Self-pay | Admitting: Internal Medicine

## 2018-06-28 DIAGNOSIS — I2699 Other pulmonary embolism without acute cor pulmonale: Secondary | ICD-10-CM | POA: Diagnosis not present

## 2018-06-28 DIAGNOSIS — I5032 Chronic diastolic (congestive) heart failure: Secondary | ICD-10-CM | POA: Diagnosis not present

## 2018-06-28 DIAGNOSIS — J9601 Acute respiratory failure with hypoxia: Secondary | ICD-10-CM | POA: Diagnosis not present

## 2018-07-29 DIAGNOSIS — J9601 Acute respiratory failure with hypoxia: Secondary | ICD-10-CM | POA: Diagnosis not present

## 2018-07-29 DIAGNOSIS — I2699 Other pulmonary embolism without acute cor pulmonale: Secondary | ICD-10-CM | POA: Diagnosis not present

## 2018-07-29 DIAGNOSIS — I5032 Chronic diastolic (congestive) heart failure: Secondary | ICD-10-CM | POA: Diagnosis not present

## 2018-08-18 ENCOUNTER — Encounter: Payer: Self-pay | Admitting: Internal Medicine

## 2018-08-18 ENCOUNTER — Ambulatory Visit (INDEPENDENT_AMBULATORY_CARE_PROVIDER_SITE_OTHER): Payer: Medicare Other | Admitting: Internal Medicine

## 2018-08-18 VITALS — Ht 62.0 in

## 2018-08-18 DIAGNOSIS — F0151 Vascular dementia with behavioral disturbance: Secondary | ICD-10-CM | POA: Diagnosis not present

## 2018-08-18 DIAGNOSIS — I10 Essential (primary) hypertension: Secondary | ICD-10-CM | POA: Diagnosis not present

## 2018-08-18 DIAGNOSIS — R7302 Impaired glucose tolerance (oral): Secondary | ICD-10-CM

## 2018-08-18 DIAGNOSIS — F01518 Vascular dementia, unspecified severity, with other behavioral disturbance: Secondary | ICD-10-CM

## 2018-08-18 MED ORDER — RIVAROXABAN 10 MG PO TABS: 10.0000 mg | ORAL_TABLET | Freq: Every day | ORAL | 3 refills | Status: DC

## 2018-08-18 NOTE — Progress Notes (Signed)
Patient ID: Heather [name], female   DOB: 1928-08-10, 85 y.o.   MRN: [id number]  Virtual Visit via Video Note  I connected with Heather [name] on 08/18/18 at  2:40 PM EDT by a video enabled telemedicine application and verified that I am speaking with the correct person using two identifiers.  Location: Patient: at home with daughter Provider: at office   I discussed the limitations of evaluation and management by telemedicine and the availability of in person appointments. The patient expressed understanding and agreed to proceed.  History of Present Illness: 85yo bedrriden with excellent 24/7 care seen with daughter.  Pt denies chest pain, increased sob or doe, wheezing, orthopnea, PND, increased LE swelling, palpitations, dizziness or syncope.  Pt denies new neurological symptoms such as new headache, or facial or extremity weakness or numbness   Pt denies polydipsia, polyuria,  BP's at home per home health about 150/90, and family request no change in tx.  Home o2 sats recent 94-96% on RA at rest.  Gets adequate hydration and nutrition carefully monitored.  Denies worsening reflux, abd pain, dysphagia, n/v, bowel change or blood.  Denies worsening depressive symptoms, suicidal ideation, or panic;Dementia overall stable and not assoc with behavioral changes such as hallucinations, paranoia, or agitation. Takes laxative twice weekly with good control.  Has hoyer lift at home and very difficult to get to office.  No new complaints Past Medical History:  Diagnosis Date  . ALLERGIC RHINITIS 12/09/2006  . DEGENERATIVE JOINT DISEASE, LEFT KNEE 01/23/2007  . Encephalocele ([place]) 01/23/2007  . FEVER UNSPECIFIED 01/28/2010  . GERD 12/09/2006  . GLUCOSE INTOLERANCE 01/24/2009  . GOUT 12/09/2006  . HYPERCHOLESTEROLEMIA 12/09/2006  . HYPERLIPIDEMIA 12/18/2006  . HYPERTENSION 12/09/2006  . Impaired glucose tolerance 07/26/2010  . MENOPAUSAL DISORDER 01/25/2008  . Stroke [facility])    Past Surgical History:   Procedure Laterality Date  . s/p anterior encephalocele repair and craniotomy  2003    reports that she has never smoked. She has never used smokeless tobacco. She reports that she does not drink alcohol or use drugs. family history includes Hypertension in an other family member. Allergies  Allergen Reactions  . Lipitor [Atorvastatin] Other (See Comments)    myalgia  . Codeine Other (See Comments)  . Lovastatin Other (See Comments)  . Pravastatin Sodium Other (See Comments)   Current Outpatient Medications on File Prior to Visit  Medication Sig Dispense Refill  . acetaminophen (TYLENOL) 500 MG tablet Take 500 mg by mouth every 6 (six) hours as needed for moderate pain (knee, leg, stomach).    Marland Kitchen amLODipine (NORVASC) 5 MG tablet TAKE 1 TABLET BY MOUTH ONCE DAILY 90 tablet 1  . benazepril (LOTENSIN) 40 MG tablet TAKE ONE TABLET BY MOUTH ONCE DAILY 90 tablet 3  . buPROPion (WELLBUTRIN) 75 MG tablet TAKE 1 TABLET BY MOUTH AT BEDTIME 90 tablet 3  . donepezil (ARICEPT) 10 MG tablet Take 1 tablet daily 90 tablet 3  . levothyroxine (SYNTHROID, LEVOTHROID) 25 MCG tablet Take 0.5 tablets (12.5 mcg total) by mouth daily before breakfast. 30 tablet 0  . nystatin (NYSTATIN) powder Use as directed twice per day as needed for itching 45 g 5  . pantoprazole (PROTONIX) 40 MG tablet TAKE 1 TABLET BY MOUTH ONCE DAILY 90 tablet 1  . QUEtiapine (SEROQUEL) 50 MG tablet Take 1 tablet daily 90 tablet 3  . traMADol (ULTRAM) 50 MG tablet Take 1 tablet (50 mg total) by mouth every 8 (eight) hours as needed. for pain  90 tablet 0  . traZODone (DESYREL) 50 MG tablet TAKE 1/2 TO 1 (ONE-HALF TO ONE) TABLET BY MOUTH AT BEDTIME AS NEEDED FOR SLEEP 30 tablet 11   No current facility-administered medications on file prior to visit.     Observations/Objective: Alert, NAD, appropriate mood and affect, resps normal, cn 2-12 intact, moves UE's well, no visible rash or swelling Lab Results  Component Value Date   WBC 4.0  08/17/2017   HGB 14.1 08/17/2017   HCT 44.4 08/17/2017   PLT 261.0 08/17/2017   GLUCOSE 100 (H) 08/17/2017   CHOL 212 (H) 12/15/2016   TRIG 189.0 (H) 12/15/2016   HDL 31.80 (L) 12/15/2016   LDLDIRECT 139.0 01/01/2016   LDLCALC 142 (H) 12/15/2016   ALT 18 08/17/2017   AST 25 08/17/2017   NA 138 08/17/2017   K 4.2 08/17/2017   CL 104 08/17/2017   CREATININE 0.87 08/17/2017   BUN 17 08/17/2017   CO2 24 08/17/2017   TSH 4.13 08/17/2017   INR 2.43 01/25/2017   HGBA1C 6.4 12/15/2016   Assessment and Plan: See notes  Follow Up Instructions: See notes   I discussed the assessment and treatment plan with the patient. The patient was provided an opportunity to ask questions and all were answered. The patient agreed with the plan and demonstrated an understanding of the instructions.   The patient was advised to call back or seek an in-person evaluation if the symptoms worsen or if the condition fails to improve as anticipated.  [name], MD

## 2018-08-18 NOTE — Assessment & Plan Note (Signed)
stable overall by history and exam, recent data reviewed with pt, and pt to continue medical treatment as before,  to f/u any worsening symptoms or concerns  

## 2018-08-18 NOTE — Patient Instructions (Signed)
Please continue all other medications as before, and refills have been done if requested.  Please have the pharmacy call with any other refills you may need.  Please continue your efforts at being more active, low cholesterol diet, and weight control.  You are otherwise up to date with prevention measures today.  Please keep your appointments with your specialists as you may have planned  We will try to ask [facility] for lab draw at home  Please return in 6 months, or sooner if needed, by virtual visit if unable to come to office

## 2018-08-18 NOTE — Assessment & Plan Note (Signed)
stable overall by history and exam, recent data reviewed with pt, and pt to continue medical treatment as before,  to f/u any worsening symptoms or concerns, family declines any med tx change

## 2018-08-28 DIAGNOSIS — I2699 Other pulmonary embolism without acute cor pulmonale: Secondary | ICD-10-CM | POA: Diagnosis not present

## 2018-08-28 DIAGNOSIS — I5032 Chronic diastolic (congestive) heart failure: Secondary | ICD-10-CM | POA: Diagnosis not present

## 2018-08-28 DIAGNOSIS — J9601 Acute respiratory failure with hypoxia: Secondary | ICD-10-CM | POA: Diagnosis not present

## 2018-09-27 ENCOUNTER — Telehealth: Payer: Self-pay

## 2018-09-27 NOTE — Telephone Encounter (Signed)
Form has been filled out and placed on your desk for signature.    Copied from [place] [id number]. Topic: General - Other >> Sep 26, 2018 10:34 AM Celene Kras A wrote: Reason for CRM: Pts daughter called and is requesting to receive a handicap placard for pt. Please advise.

## 2018-09-27 NOTE — Telephone Encounter (Signed)
Pt's daughter, [name] has been informed and requested it be sent by mail.  Copied from [place] [id number]. Topic: General - Other >> Sep 26, 2018 10:34 AM Celene Kras A wrote: Reason for CRM: Pts daughter called and is requesting to receive a handicap placard for pt. Please advise.

## 2018-09-28 DIAGNOSIS — I2699 Other pulmonary embolism without acute cor pulmonale: Secondary | ICD-10-CM | POA: Diagnosis not present

## 2018-09-28 DIAGNOSIS — I5032 Chronic diastolic (congestive) heart failure: Secondary | ICD-10-CM | POA: Diagnosis not present

## 2018-09-28 DIAGNOSIS — J9601 Acute respiratory failure with hypoxia: Secondary | ICD-10-CM | POA: Diagnosis not present

## 2018-10-03 ENCOUNTER — Other Ambulatory Visit: Payer: Self-pay | Admitting: Neurology

## 2018-10-03 DIAGNOSIS — F0390 Unspecified dementia without behavioral disturbance: Secondary | ICD-10-CM

## 2018-10-03 DIAGNOSIS — F03A0 Unspecified dementia, mild, without behavioral disturbance, psychotic disturbance, mood disturbance, and anxiety: Secondary | ICD-10-CM

## 2018-10-17 ENCOUNTER — Other Ambulatory Visit: Payer: Self-pay | Admitting: Internal Medicine

## 2018-10-28 DIAGNOSIS — I2699 Other pulmonary embolism without acute cor pulmonale: Secondary | ICD-10-CM | POA: Diagnosis not present

## 2018-10-28 DIAGNOSIS — I5032 Chronic diastolic (congestive) heart failure: Secondary | ICD-10-CM | POA: Diagnosis not present

## 2018-10-28 DIAGNOSIS — J9601 Acute respiratory failure with hypoxia: Secondary | ICD-10-CM | POA: Diagnosis not present

## 2018-11-07 ENCOUNTER — Other Ambulatory Visit: Payer: Self-pay | Admitting: Neurology

## 2018-11-07 DIAGNOSIS — F0390 Unspecified dementia without behavioral disturbance: Secondary | ICD-10-CM

## 2018-11-07 DIAGNOSIS — F03A0 Unspecified dementia, mild, without behavioral disturbance, psychotic disturbance, mood disturbance, and anxiety: Secondary | ICD-10-CM

## 2018-11-11 ENCOUNTER — Other Ambulatory Visit: Payer: Self-pay | Admitting: Neurology

## 2018-11-11 DIAGNOSIS — F03A0 Unspecified dementia, mild, without behavioral disturbance, psychotic disturbance, mood disturbance, and anxiety: Secondary | ICD-10-CM

## 2018-11-11 DIAGNOSIS — F0390 Unspecified dementia without behavioral disturbance: Secondary | ICD-10-CM

## 2018-11-15 ENCOUNTER — Other Ambulatory Visit: Payer: Self-pay

## 2018-11-15 ENCOUNTER — Encounter: Payer: Self-pay | Admitting: Neurology

## 2018-11-15 ENCOUNTER — Telehealth (INDEPENDENT_AMBULATORY_CARE_PROVIDER_SITE_OTHER): Payer: Medicare Other | Admitting: Neurology

## 2018-11-15 ENCOUNTER — Encounter

## 2018-11-15 VITALS — Ht 62.0 in

## 2018-11-15 DIAGNOSIS — F0391 Unspecified dementia with behavioral disturbance: Secondary | ICD-10-CM

## 2018-11-15 DIAGNOSIS — F03B18 Unspecified dementia, moderate, with other behavioral disturbance: Secondary | ICD-10-CM

## 2018-11-15 MED ORDER — QUETIAPINE FUMARATE 50 MG PO TABS: ORAL_TABLET | ORAL | 3 refills | Status: DC

## 2018-11-15 MED ORDER — BUPROPION HCL 75 MG PO TABS: ORAL_TABLET | ORAL | 3 refills | Status: DC

## 2018-11-15 MED ORDER — DONEPEZIL HCL 10 MG PO TABS: ORAL_TABLET | ORAL | 3 refills | Status: DC

## 2018-11-15 NOTE — Progress Notes (Signed)
Virtual Visit via Video Note The purpose of this virtual visit is to provide medical care while limiting exposure to the novel coronavirus.    Consent was obtained for video visit:  Yes.   Answered questions that patient had about telehealth interaction:  Yes.   I discussed the limitations, risks, security and privacy concerns of performing an evaluation and management service by telemedicine. I also discussed with the patient that there may be a patient responsible charge related to this service. The patient expressed understanding and agreed to proceed.  Pt location: Home Physician Location: office Name of referring provider:  Biagio Borg, MD I connected with [name] at patients initiation/request on 11/15/2018 at  3:30 PM EDT by video enabled telemedicine application and verified that I am speaking with the correct person using two identifiers. Pt MRN:  [id number] Pt DOB:  Aug 16, 1928 Video Participants:  [name];  Heywood Iles (daughter)   History of Present Illness:  The patient was seen as a virtual video visit on 11/15/2018. She was last seen over a year ago for dementia with behavioral disturbance. Her daughter [name] is present to help supplement the history. Since her last visit, [name] reports that her memory is pretty good. The patient says the same thing. As long as she has a routine, she does pretty well. Her daughter manages medications and finances. She does not drive. She does have difficulty following instructions. She is not always clear and coherent when she just wakes up. She is now wheelchair-bound and needs a hoyer lift for transfers. She is mostly in bed or on a recliner, she gets dizzy if she is up for more than 15 minutes. In the past she was having episodes of decreased responsiveness, she has not had these in a while. She is able to feed herself. She has 24/7 care and needs assistance with dressing and bathing. There have been no further hallucinations or  paranoia in over a year. She is on Donepezil 10mg  daily, Wellbutrin 75mg  qhs, and Seroquel 50mg  qhs with no side effects.  History on Initial Assessment 03/09/2016: This is an 85 yo RH woman with a history of hypertension, hyperlipidemia, encephalocele s/p surgery in 2003, with memory loss and stroke in October 2017. She had seen her PCP on 01/01/16 for memory problems. An MRI brain was ordered, however while waiting for the scan, she started having shortness of breath and was admitted 01/24/16 and found to have DVTs in both lower extremities. She was also complaining of generalized weakness with difficulty ambulating. She was discharged home on Xarelto and home PT. She went for outpatient previously scheduled MRI brain on 02/03/16, which I personally reviewed, showing an acute/subacute small left periatrial infarct without associated hemorrhage. There was a remote moderate-sized right parietal lobe infarct. There was marked chronic microvascular change. Remote frontal craniotomy for repair of anterior encephalocele was seen, with significant surrounding encephalomalacia. There was moderate global atrophy without hydrocephalus. She was instructed to go to the ER, but since she had recent echocardiogram and was started on Xarelto, and had no clear deficits on exam, and she was discharged home with outpatient follow-up.   She reports that her memory has been "on and off" for conversations. She can hold a conversation then just "slacks off." Her daughter has noticed this and feels that she loses interest, cutting off conversations. She endorses some word-finding difficulties. Her daughter states that sometimes it is hard to say about her memory because of her "quirky  sense of humor." For instance, this morning she said she did not want breakfast. After her daughter came home, she told her she was hungry, so her daughter fixed food. Once made, she stated she did not want food. She only repeats herself when they are  having conversations in the bathroom at 3am. She had previously been living with her husband until hospital admission, and has been living with her daughter for the past 2 months. She denied any missed bill payments when she was living with her husband. She is pretty good with remembering to take her morning medications. She stopped driving due to mobility issues 3 years ago. She has been using a wheelchair since recent hospital discharge, however her daughter reports that for the past 2 years, she has not walked a lot because of bilateral leg weakness. She would use a walker and hold on to furniture. She feels her left arm and leg are weaker. She has occasional tingling in both hands. She needs assistance with dressing and bathing. Her daughter reports a couple of falls in the past 2 years, she would tell her daughter about them 1-2 weeks later. She denies any prior history of stroke. She denies any headaches, dizziness, diplopia, dysarthria/dysphagia. No personality changes or hallucinations. She is having some constipation, but her bigger issue is urinary urgency. She has the urge to urinate 4 to 6 times an hour at night, but once she sits on the commode, she does not urinate. During the day, she has the urge around 3 times an hour.      Current Outpatient Medications on File Prior to Visit  Medication Sig Dispense Refill  . amLODipine (NORVASC) 5 MG tablet TAKE 1 TABLET BY MOUTH ONCE DAILY 90 tablet 1  . benazepril (LOTENSIN) 40 MG tablet TAKE ONE TABLET BY MOUTH ONCE DAILY 90 tablet 3  . levothyroxine (SYNTHROID, LEVOTHROID) 25 MCG tablet Take 0.5 tablets (12.5 mcg total) by mouth daily before breakfast. 30 tablet 0  . nystatin (NYSTATIN) powder Use as directed twice per day as needed for itching 45 g 5  . pantoprazole (PROTONIX) 40 MG tablet Take 1 tablet by mouth once daily 90 tablet 0  . rivaroxaban (XARELTO) 10 MG TABS tablet Take 1 tablet (10 mg total) by mouth daily. 90 tablet 3  . traMADol  (ULTRAM) 50 MG tablet Take 1 tablet (50 mg total) by mouth every 8 (eight) hours as needed. for pain 90 tablet 0  . traZODone (DESYREL) 50 MG tablet TAKE 1/2 TO 1 (ONE-HALF TO ONE) TABLET BY MOUTH AT BEDTIME AS NEEDED FOR SLEEP 30 tablet 11   No current facility-administered medications on file prior to visit.      Observations/Objective:   Vitals:   11/15/18 1553  Height: 5\' 2"  (1.575 m)   GEN:  The patient appears stated age and is in NAD. She is lying down in bed.  Neurological examination: Patient is awake, alert, oriented to person, city, year. No aphasia or dysarthria. Reduced fluency, able to follow simple commands. Remote and recent memory impaired. Able to name and repeat. Cranial nerves: Extraocular movements intact with no nystagmus. No facial asymmetry. Motor: moves all extremities symmetrically, at least anti-gravity x 4. No incoordination on finger to nose testing. Gait not tested.  Montreal Cognitive Assessment Blind 11/15/2018  Attention: Read list of digits (0/2) 0  Attention: Read list of letters (0/1) 0  Attention: Serial 7 subtraction starting at 100 (0/3) 0  Language: Repeat phrase (0/2) 1  Language :  Fluency (0/1) 0  Abstraction (0/2) 0  Delayed Recall (0/5) 1  Orientation (0/6) 3  Total 5  Adjusted Score (based on education) 6/22    Assessment and Plan:   This is an 85 yo RH woman with a history of hypertension, hyperlipidemia, encephalocele s/p surgery in 2003, subcortical stroke in October 2017, with moderate dementia with behavioral disturbance. MOCA blind (done over phone) today 6/22. Continue Aricept 10mg  daily, Wellbutrin 75mg  qhs, and Seroquel 50mg  qhs. Refills sent. Continue control of vascular risk factors for secondary stroke prevention. Follow-up in 6 months, call for any changes.   Follow Up Instructions:   -I discussed the assessment and treatment plan with the patient/daughter. The patient/daughter were provided an opportunity to ask questions and  all were answered. The patient/daughter agreed with the plan and demonstrated an understanding of the instructions.   The patient/daughter was advised to call back or seek an in-person evaluation if the symptoms worsen or if the condition fails to improve as anticipated.     Cameron Sprang, MD

## 2018-11-27 ENCOUNTER — Other Ambulatory Visit: Payer: Self-pay | Admitting: Internal Medicine

## 2018-11-28 DIAGNOSIS — J9601 Acute respiratory failure with hypoxia: Secondary | ICD-10-CM | POA: Diagnosis not present

## 2018-11-28 DIAGNOSIS — I5032 Chronic diastolic (congestive) heart failure: Secondary | ICD-10-CM | POA: Diagnosis not present

## 2018-11-28 DIAGNOSIS — I2699 Other pulmonary embolism without acute cor pulmonale: Secondary | ICD-10-CM | POA: Diagnosis not present

## 2018-12-13 ENCOUNTER — Telehealth: Payer: Self-pay

## 2018-12-13 NOTE — Telephone Encounter (Signed)
Pt's daughter has been informed to check with HH to see if they have someone to come out to administer the injections. If so PCP can send in scripts to the pharmacy. If [facility] doesn't offer anyone to come out the pt would have to be seen in office for immunizations.   Copied from [place] [phone]. Topic: General - Other >> Dec 13, 2018 10:33 AM Rainey Pines A wrote: Patients caregiver [name] is requesting a callback from Dr .Jenny Reichmann nurse in regards to patients receiving a flu and pneumonia shot. Best contact number [phone] >> Dec 13, 2018 10:45 AM Morey Hummingbird wrote: Patient is bed ridden, please advise on how patient can get flu shot

## 2018-12-26 ENCOUNTER — Telehealth: Payer: Self-pay | Admitting: Internal Medicine

## 2018-12-26 DIAGNOSIS — R7302 Impaired glucose tolerance (oral): Secondary | ICD-10-CM

## 2018-12-26 DIAGNOSIS — Z0000 Encounter for general adult medical examination without abnormal findings: Secondary | ICD-10-CM

## 2018-12-26 NOTE — Telephone Encounter (Signed)
Heather [name] has been informed that orders have been put in.

## 2018-12-26 NOTE — Telephone Encounter (Signed)
Copied from [place] [phone]. Topic: General - Other >> Dec 26, 2018  9:48 AM Carolyn Stare wrote: Pt daughter Neoma Laming call to say pt had virtual appt in May and said pt was suppose to have labs drawn but there is no orders in and she would like a call back   [phone]

## 2018-12-29 ENCOUNTER — Ambulatory Visit: Payer: Medicare Other

## 2018-12-29 DIAGNOSIS — I2699 Other pulmonary embolism without acute cor pulmonale: Secondary | ICD-10-CM | POA: Diagnosis not present

## 2018-12-29 DIAGNOSIS — J9601 Acute respiratory failure with hypoxia: Secondary | ICD-10-CM | POA: Diagnosis not present

## 2018-12-29 DIAGNOSIS — I5032 Chronic diastolic (congestive) heart failure: Secondary | ICD-10-CM | POA: Diagnosis not present

## 2019-01-02 ENCOUNTER — Ambulatory Visit (INDEPENDENT_AMBULATORY_CARE_PROVIDER_SITE_OTHER): Payer: Medicare Other

## 2019-01-02 DIAGNOSIS — Z23 Encounter for immunization: Secondary | ICD-10-CM

## 2019-01-12 ENCOUNTER — Other Ambulatory Visit: Payer: Self-pay | Admitting: Internal Medicine

## 2019-01-17 ENCOUNTER — Other Ambulatory Visit: Payer: Self-pay | Admitting: Internal Medicine

## 2019-01-31 ENCOUNTER — Ambulatory Visit (INDEPENDENT_AMBULATORY_CARE_PROVIDER_SITE_OTHER): Payer: Medicare Other | Admitting: Internal Medicine

## 2019-01-31 DIAGNOSIS — R7302 Impaired glucose tolerance (oral): Secondary | ICD-10-CM

## 2019-01-31 DIAGNOSIS — J309 Allergic rhinitis, unspecified: Secondary | ICD-10-CM

## 2019-01-31 DIAGNOSIS — I10 Essential (primary) hypertension: Secondary | ICD-10-CM | POA: Diagnosis not present

## 2019-01-31 DIAGNOSIS — F0151 Vascular dementia with behavioral disturbance: Secondary | ICD-10-CM | POA: Diagnosis not present

## 2019-01-31 DIAGNOSIS — F01518 Vascular dementia, unspecified severity, with other behavioral disturbance: Secondary | ICD-10-CM

## 2019-01-31 MED ORDER — GUAIFENESIN ER 600 MG PO TB12: 1200.0000 mg | ORAL_TABLET | Freq: Two times a day (BID) | ORAL | 1 refills | Status: DC | PRN

## 2019-01-31 MED ORDER — TRIAMCINOLONE ACETONIDE 55 MCG/ACT NA AERO: 2.0000 | INHALATION_SPRAY | Freq: Every day | NASAL | 12 refills | Status: DC

## 2019-01-31 MED ORDER — CETIRIZINE HCL 10 MG PO TABS: 10.0000 mg | ORAL_TABLET | Freq: Every day | ORAL | 11 refills | Status: DC

## 2019-01-31 NOTE — Progress Notes (Signed)
Patient ID: Heather [name], female   DOB: Nov 04, 1928, 85 y.o.   MRN: [id number]  Virtual Visit via Video Note  I connected with [name] on 01/31/19 at 11:00 AM EDT by a video enabled telemedicine application and verified that I am speaking with the correct person using two identifiers.  Location: Patient: at home Provider: at office   I discussed the limitations of evaluation and management by telemedicine and the availability of in person appointments. The patient expressed understanding and agreed to proceed.  History of Present Illness: Here with c/o Does have several wks ongoing nasal allergy symptoms with clearish congestion, itch and sneezing, without fever, pain, ST, cough, swelling but does have occasional wheezing, somewhat worse at night.  Denies pain or HA.  Bp has been mild elevated this am, and o2 sat at home 90-92%. Pt denies chest pain, increased sob or doe, wheezing, orthopnea, PND, increased LE swelling, palpitations, dizziness or syncope.  Pt denies new neurological symptoms such as new headache, or facial or extremity weakness or numbness   Pt denies polydipsia, polyuria.  Dementia overall stable symptomatically, and not assoc with behavioral changes such as hallucinations, paranoia, or agitation. Past Medical History:  Diagnosis Date  . ALLERGIC RHINITIS 12/09/2006  . DEGENERATIVE JOINT DISEASE, LEFT KNEE 01/23/2007  . Encephalocele ([place]) 01/23/2007  . FEVER UNSPECIFIED 01/28/2010  . GERD 12/09/2006  . GLUCOSE INTOLERANCE 01/24/2009  . GOUT 12/09/2006  . HYPERCHOLESTEROLEMIA 12/09/2006  . HYPERLIPIDEMIA 12/18/2006  . HYPERTENSION 12/09/2006  . Impaired glucose tolerance 07/26/2010  . MENOPAUSAL DISORDER 01/25/2008  . Stroke [facility])    Past Surgical History:  Procedure Laterality Date  . s/p anterior encephalocele repair and craniotomy  2003    reports that she has never smoked. She has never used smokeless tobacco. She reports that she does not drink alcohol or use  drugs. family history includes Hypertension in an other family member. Allergies  Allergen Reactions  . Lipitor [Atorvastatin] Other (See Comments)    myalgia  . Codeine Other (See Comments)  . Lovastatin Other (See Comments)  . Pravastatin Sodium Other (See Comments)   Current Outpatient Medications on File Prior to Visit  Medication Sig Dispense Refill  . amLODipine (NORVASC) 5 MG tablet Take 1 tablet by mouth once daily 90 tablet 0  . benazepril (LOTENSIN) 40 MG tablet TAKE ONE TABLET BY MOUTH ONCE DAILY 90 tablet 3  . buPROPion (WELLBUTRIN) 75 MG tablet TAKE 1 TABLET BY MOUTH AT BEDTIME 90 tablet 3  . donepezil (ARICEPT) 10 MG tablet TAKE 1 TABLET BY MOUTH ONCE DAILY 90 tablet 3  . levothyroxine (SYNTHROID, LEVOTHROID) 25 MCG tablet Take 0.5 tablets (12.5 mcg total) by mouth daily before breakfast. 30 tablet 0  . nystatin (NYSTATIN) powder USE AS DIRECTED TWICE DAILY AS NEEDED 45 g 0  . pantoprazole (PROTONIX) 40 MG tablet Take 1 tablet by mouth once daily 90 tablet 0  . QUEtiapine (SEROQUEL) 50 MG tablet Take 1 tablet daily 90 tablet 3  . rivaroxaban (XARELTO) 10 MG TABS tablet Take 1 tablet (10 mg total) by mouth daily. 90 tablet 3  . traMADol (ULTRAM) 50 MG tablet Take 1 tablet (50 mg total) by mouth every 8 (eight) hours as needed. for pain 90 tablet 0  . traZODone (DESYREL) 50 MG tablet TAKE 1/2 TO 1 (ONE-HALF TO ONE) TABLET BY MOUTH AT BEDTIME AS NEEDED FOR SLEEP 30 tablet 11   No current facility-administered medications on file prior to visit.  Observations/Objective: Alert, NAD, appropriate mood and affect, resps normal, cn 2-12 intact, moves all 4s, no visible rash or swelling Lab Results  Component Value Date   WBC 4.0 08/17/2017   HGB 14.1 08/17/2017   HCT 44.4 08/17/2017   PLT 261.0 08/17/2017   GLUCOSE 100 (H) 08/17/2017   CHOL 212 (H) 12/15/2016   TRIG 189.0 (H) 12/15/2016   HDL 31.80 (L) 12/15/2016   LDLDIRECT 139.0 01/01/2016   LDLCALC 142 (H) 12/15/2016    ALT 18 08/17/2017   AST 25 08/17/2017   NA 138 08/17/2017   K 4.2 08/17/2017   CL 104 08/17/2017   CREATININE 0.87 08/17/2017   BUN 17 08/17/2017   CO2 24 08/17/2017   TSH 4.13 08/17/2017   INR 2.43 01/25/2017   HGBA1C 6.4 12/15/2016   Assessment and Plan: See notes  Follow Up Instructions: See notes   I discussed the assessment and treatment plan with the patient. The patient was provided an opportunity to ask questions and all were answered. The patient agreed with the plan and demonstrated an understanding of the instructions.   The patient was advised to call back or seek an in-person evaluation if the symptoms worsen or if the condition fails to improve as anticipated.   [name], MD

## 2019-02-05 ENCOUNTER — Encounter: Payer: Self-pay | Admitting: Internal Medicine

## 2019-02-05 NOTE — Assessment & Plan Note (Signed)
No behavioral difficulty recently, stable, to cont same tx

## 2019-02-05 NOTE — Assessment & Plan Note (Signed)
stable overall by history and exam, recent data reviewed with pt, and pt to continue medical treatment as before,  to f/u any worsening symptoms or concerns  

## 2019-02-05 NOTE — Assessment & Plan Note (Signed)
Mild to mod, c/w seasonal flare, for zyrtec and nasacort, to f/u any worsening symptoms or concerns

## 2019-02-05 NOTE — Patient Instructions (Signed)
Please take all new medication as prescribed - the zyrtec and nasacort  Please continue all other medications as before, and refills have been done if requested.  Please have the pharmacy call with any other refills you may need.  Please continue your efforts at being more active, low cholesterol diet, and weight control.  Please keep your appointments with your specialists as you may have planned

## 2019-02-05 NOTE — Assessment & Plan Note (Signed)
Cont to monitor BP on regular basis, and f/u for persitent > 140/90

## 2019-03-06 ENCOUNTER — Other Ambulatory Visit: Payer: Self-pay | Admitting: Internal Medicine

## 2019-03-13 ENCOUNTER — Other Ambulatory Visit: Payer: Self-pay | Admitting: Internal Medicine

## 2019-03-15 ENCOUNTER — Ambulatory Visit (INDEPENDENT_AMBULATORY_CARE_PROVIDER_SITE_OTHER): Payer: Medicare Other | Admitting: Internal Medicine

## 2019-03-15 ENCOUNTER — Ambulatory Visit: Payer: Self-pay

## 2019-03-15 DIAGNOSIS — E039 Hypothyroidism, unspecified: Secondary | ICD-10-CM

## 2019-03-15 DIAGNOSIS — J309 Allergic rhinitis, unspecified: Secondary | ICD-10-CM

## 2019-03-15 DIAGNOSIS — R7302 Impaired glucose tolerance (oral): Secondary | ICD-10-CM | POA: Diagnosis not present

## 2019-03-15 DIAGNOSIS — E038 Other specified hypothyroidism: Secondary | ICD-10-CM

## 2019-03-15 DIAGNOSIS — I10 Essential (primary) hypertension: Secondary | ICD-10-CM

## 2019-03-15 NOTE — Patient Instructions (Addendum)
Please continue all other medications as before, and refills have been done if requested.  Please have the pharmacy call with any other refills you may need.  Please continue your efforts at being more active, low cholesterol diet, and weight control.  Please keep your appointments with your specialists as you may have planned  Please go to the LAB in the Basement (turn left off the elevator) for the tests to be done as you are able  You will be contacted by phone if any changes need to be made immediately.  Otherwise, you will receive a letter about your results with an explanation, but please check with MyChart first.  Please remember to sign up for MyChart if you have not done so, as this will be important to you in the future with finding out test results, communicating by private email, and scheduling acute appointments online when needed.  Please return in 6 months, or sooner if needed

## 2019-03-15 NOTE — Telephone Encounter (Signed)
Noted  

## 2019-03-15 NOTE — Progress Notes (Signed)
Patient ID: Heather [name], female   DOB: [birth year], 85 y.o.   MRN: [id number]  Virtual Visit via Video Note  I connected with Heather [name] on 03/15/19 at  3:00 PM EST by a video enabled telemedicine application and verified that I am speaking with the correct person using two identifiers.  Location: Patient: at home in bed, daughter assisting with call Provider: at office   I discussed the limitations of evaluation and management by telemedicine and the availability of in person appointments. The patient expressed understanding and agreed to proceed.  History of Present Illness: Here to f/u; overall doing ok,  Pt denies chest pain, increasing sob or doe, wheezing, orthopnea, PND, increased LE swelling, palpitations, dizziness or syncope.  Pt denies new neurological symptoms such as new headache, or facial or extremity weakness or numbness.  Pt denies polydipsia, polyuria, or low sugar episode.  Pt states overall good compliance with meds, mostly trying to follow appropriate diet.  Does have several wks ongoing nasal allergy symptoms with clearish congestion, itch and sneezing, without fever, pain, ST, cough, swelling or wheezing but better with claritin.  BP at home typically 140/74 Past Medical History:  Diagnosis Date  . ALLERGIC RHINITIS 12/09/2006  . DEGENERATIVE JOINT DISEASE, LEFT KNEE 01/23/2007  . Encephalocele ([place]) 01/23/2007  . FEVER UNSPECIFIED 01/28/2010  . GERD 12/09/2006  . GLUCOSE INTOLERANCE 01/24/2009  . GOUT 12/09/2006  . HYPERCHOLESTEROLEMIA 12/09/2006  . HYPERLIPIDEMIA 12/18/2006  . HYPERTENSION 12/09/2006  . Impaired glucose tolerance 07/26/2010  . MENOPAUSAL DISORDER 01/25/2008  . Stroke [facility])    Past Surgical History:  Procedure Laterality Date  . s/p anterior encephalocele repair and craniotomy  2003    reports that she has never smoked. She has never used smokeless tobacco. She reports that she does not drink alcohol or use drugs. family history includes  Hypertension in an other family member. Allergies  Allergen Reactions  . Lipitor [Atorvastatin] Other (See Comments)    myalgia  . Codeine Other (See Comments)  . Lovastatin Other (See Comments)  . Pravastatin Sodium Other (See Comments)   Current Outpatient Medications on File Prior to Visit  Medication Sig Dispense Refill  . amLODipine (NORVASC) 5 MG tablet Take 1 tablet by mouth once daily 90 tablet 1  . benazepril (LOTENSIN) 40 MG tablet TAKE ONE TABLET BY MOUTH ONCE DAILY 90 tablet 3  . buPROPion (WELLBUTRIN) 75 MG tablet TAKE 1 TABLET BY MOUTH AT BEDTIME 90 tablet 3  . cetirizine (ZYRTEC) 10 MG tablet Take 1 tablet (10 mg total) by mouth daily. 30 tablet 11  . donepezil (ARICEPT) 10 MG tablet TAKE 1 TABLET BY MOUTH ONCE DAILY 90 tablet 3  . guaiFENesin (MUCINEX) 600 MG 12 hr tablet Take 2 tablets (1,200 mg total) by mouth 2 (two) times daily as needed. 60 tablet 1  . levothyroxine (SYNTHROID, LEVOTHROID) 25 MCG tablet Take 0.5 tablets (12.5 mcg total) by mouth daily before breakfast. 30 tablet 0  . nystatin (NYSTATIN) powder USE AS DIRECTED TWICE DAILY AS NEEDED 45 g 0  . pantoprazole (PROTONIX) 40 MG tablet Take 1 tablet by mouth once daily 90 tablet 0  . QUEtiapine (SEROQUEL) 50 MG tablet Take 1 tablet daily 90 tablet 3  . rivaroxaban (XARELTO) 10 MG TABS tablet Take 1 tablet (10 mg total) by mouth daily. 90 tablet 3  . traMADol (ULTRAM) 50 MG tablet Take 1 tablet (50 mg total) by mouth every 8 (eight) hours as needed. for pain 90 tablet 0  .  traZODone (DESYREL) 50 MG tablet TAKE 1/2 TO 1 (ONE-HALF TO ONE) TABLET BY MOUTH AT BEDTIME AS NEEDED FOR SLEEP 30 tablet 11  . triamcinolone (NASACORT) 55 MCG/ACT AERO nasal inhaler Place 2 sprays into the nose daily. 1 Inhaler 12   No current facility-administered medications on file prior to visit.     Observations/Objective: Alert, NAD, appropriate mood and affect, resps normal, cn 2-12 intact, moves all 4s, no visible rash or  swelling Lab Results  Component Value Date   WBC 4.0 08/17/2017   HGB 14.1 08/17/2017   HCT 44.4 08/17/2017   PLT 261.0 08/17/2017   GLUCOSE 100 (H) 08/17/2017   CHOL 212 (H) 12/15/2016   TRIG 189.0 (H) 12/15/2016   HDL 31.80 (L) 12/15/2016   LDLDIRECT 139.0 01/01/2016   LDLCALC 142 (H) 12/15/2016   ALT 18 08/17/2017   AST 25 08/17/2017   NA 138 08/17/2017   K 4.2 08/17/2017   CL 104 08/17/2017   CREATININE 0.87 08/17/2017   BUN 17 08/17/2017   CO2 24 08/17/2017   TSH 4.13 08/17/2017   INR 2.43 01/25/2017   HGBA1C 6.4 12/15/2016   Assessment and Plan: See notes  Follow Up Instructions: See notes  I discussed the assessment and treatment plan with the patient. The patient was provided an opportunity to ask questions and all were answered. The patient agreed with the plan and demonstrated an understanding of the instructions.   The patient was advised to call back or seek an in-person evaluation if the symptoms worsen or if the condition fails to improve as anticipated.   [name], MD

## 2019-03-15 NOTE — Telephone Encounter (Signed)
Daughter called to report elevated BP over past 3-4 days.  Reported BP has been averaging in 150's/80's, but over past 4 days it has trended upward to 170-175/ 80's.  Voiced concern that the BP was 200/80 this AM.  Denied any complaints of headache, chest pain, or shortness of breath.  Daughter stated pt. has had strokes in past, and denied any change in baseline of right sided weakness, no noted left sided weakness and no speech difficulty.  Has not missed any doses of BP medication. Recheck of BP during call; 160/99 @ 9:30; 154/86 @ 9:40.    Transferred to office for a virtual appt.  Daughter agreed with plan.    Reason for Disposition . Systolic BP  >= 99991111 OR Diastolic >= A999333  Answer Assessment - Initial Assessment Questions 1. BLOOD PRESSURE: "What is the blood pressure?" "Did you take at least two measurements 5 minutes apart?"     200/80 @ 8:00 AM ; 160/99 @ 9:30 AM  2. ONSET: "When did you take your blood pressure?"     See above 3. HOW: "How did you obtain the blood pressure?" (e.g., visiting nurse, automatic home BP monitor)     Automatic BP  4. HISTORY: "Do you have a history of high blood pressure?"     yes 5. MEDICATIONS: "Are you taking any medications for blood pressure?" "Have you missed any doses recently?"     No missed doses 6. OTHER SYMPTOMS: "Do you have any symptoms?" (e.g., headache, chest pain, blurred vision, difficulty breathing, weakness)     C/o feeling light-headed; denied headache; denied blurred vision; baseline weakness on right side of body is unchanged; has "mild dementia" ; denied chest pain; denied shortness of breath; has sinus drainage  7. PREGNANCY: "Is there any chance you are pregnant?" "When was your last menstrual period?"     n/a  Protocols used: HIGH BLOOD PRESSURE-A-AH

## 2019-03-18 ENCOUNTER — Encounter: Payer: Self-pay | Admitting: Internal Medicine

## 2019-03-18 NOTE — Assessment & Plan Note (Signed)
stable overall by history and exam, recent data reviewed with pt, and pt to continue medical treatment as before,  to f/u any worsening symptoms or concerns  

## 2019-03-29 ENCOUNTER — Telehealth: Payer: Self-pay | Admitting: Internal Medicine

## 2019-03-29 MED ORDER — AMLODIPINE BESYLATE 5 MG PO TABS: 7.5000 mg | ORAL_TABLET | Freq: Every day | ORAL | 3 refills | Status: DC

## 2019-03-29 NOTE — Telephone Encounter (Signed)
Ms. Pilgrim daughter [name] called would like to talk to Dr. Gwynn Burly CMA about patient BP medication she is taking. She would like to give patient 1 tab and 1/2 in the morning. Please call patient back, thanks.

## 2019-03-29 NOTE — Telephone Encounter (Signed)
Based on last visit, ok for increase to 1.5 tab qd - I have done erx

## 2019-03-29 NOTE — Telephone Encounter (Signed)
Please advise.   From the chart it shows she is only taking amlodipine once daily.

## 2019-03-29 NOTE — Telephone Encounter (Signed)
Pt's daughter has been informed.  

## 2019-03-29 NOTE — Addendum Note (Signed)
Addended by: Biagio Borg on: 03/29/2019 11:59 AM   Modules accepted: Orders

## 2019-04-16 ENCOUNTER — Other Ambulatory Visit: Payer: Self-pay | Admitting: Internal Medicine

## 2019-04-17 NOTE — Telephone Encounter (Signed)
Done erx 

## 2019-06-14 ENCOUNTER — Telehealth: Payer: Self-pay | Admitting: Internal Medicine

## 2019-06-14 NOTE — Telephone Encounter (Signed)
Patient's care giver is requesting a RX for a new mattress for the hospital bed that he wrote an RX for in 2018.  This request needs to be sent to [facility] - Phone [phone] LOV :03/15/2019

## 2019-06-14 NOTE — Telephone Encounter (Signed)
Sent community message to [facility] to confirm if appointment is needed before ordering hospital mattress.

## 2019-06-20 NOTE — Telephone Encounter (Signed)
Pt scheduled for virtual appointment 06/23/19 due to being on bed rest.

## 2019-06-23 ENCOUNTER — Ambulatory Visit (INDEPENDENT_AMBULATORY_CARE_PROVIDER_SITE_OTHER): Payer: Medicare Other | Admitting: Internal Medicine

## 2019-06-23 ENCOUNTER — Telehealth: Payer: Self-pay

## 2019-06-23 ENCOUNTER — Encounter: Payer: Self-pay | Admitting: Internal Medicine

## 2019-06-23 DIAGNOSIS — I5032 Chronic diastolic (congestive) heart failure: Secondary | ICD-10-CM

## 2019-06-23 DIAGNOSIS — R7302 Impaired glucose tolerance (oral): Secondary | ICD-10-CM

## 2019-06-23 DIAGNOSIS — F0151 Vascular dementia with behavioral disturbance: Secondary | ICD-10-CM | POA: Diagnosis not present

## 2019-06-23 DIAGNOSIS — F01518 Vascular dementia, unspecified severity, with other behavioral disturbance: Secondary | ICD-10-CM

## 2019-06-23 DIAGNOSIS — Z8673 Personal history of transient ischemic attack (TIA), and cerebral infarction without residual deficits: Secondary | ICD-10-CM | POA: Diagnosis not present

## 2019-06-23 NOTE — Patient Instructions (Signed)
See notes

## 2019-06-23 NOTE — Assessment & Plan Note (Signed)
stable overall by history and exam, recent data reviewed with pt, and pt to continue medical treatment as before,  to f/u any worsening symptoms or concerns  

## 2019-06-23 NOTE — Progress Notes (Signed)
Patient ID: Heather [name], female   DOB: April 06, 1929, 86 y.o.   MRN: [id number]  Virtual Visit via Video Note  I connected with [name] on 06/23/19 at 11:00 AM EST by a video enabled telemedicine application and verified that I am speaking with the correct person using two identifiers.  Location: Patient: at home Provider: at office   I discussed the limitations of evaluation and management by telemedicine and the availability of in person appointments. The patient expressed understanding and agreed to proceed.  History of Present Illness: Here to f/u with daughter; overall doing ok,  Pt denies chest pain, increasing sob or doe, wheezing, orthopnea, PND, increased LE swelling, palpitations, dizziness or syncope.  Pt denies new neurological symptoms such as new headache, or facial or extremity weakness or numbness.  Pt denies polydipsia, polyuria, or low sugar episode.  Daughter states for her that her current mattress is 86 yo, pt is bedridden and spends at least 22 hrs in bed daily for this time, only getting out for meals with hoyer lift; has been successful in avoiding bedsores with an air mattress and frequent turning, but still needs hosp bed overall and new mattress replacement in particular as the mid section is not longer viable after lying with HOB elevated for 3 yrs.  Pt continues to require frequent turning and hosp bed due to reduce pain and history of stroke and dementia, Dementia overall stable symptomatically, and not assoc with behavioral changes such as hallucinations, paranoia, or agitation. Past Medical History:  Diagnosis Date  . ALLERGIC RHINITIS 12/09/2006  . DEGENERATIVE JOINT DISEASE, LEFT KNEE 01/23/2007  . Encephalocele ([place]) 01/23/2007  . FEVER UNSPECIFIED 01/28/2010  . GERD 12/09/2006  . GLUCOSE INTOLERANCE 01/24/2009  . GOUT 12/09/2006  . HYPERCHOLESTEROLEMIA 12/09/2006  . HYPERLIPIDEMIA 12/18/2006  . HYPERTENSION 12/09/2006  . Impaired glucose tolerance 07/26/2010   . MENOPAUSAL DISORDER 01/25/2008  . Stroke [facility])    Past Surgical History:  Procedure Laterality Date  . s/p anterior encephalocele repair and craniotomy  2003    reports that she has never smoked. She has never used smokeless tobacco. She reports that she does not drink alcohol or use drugs. family history includes Hypertension in an other family member. Allergies  Allergen Reactions  . Lipitor [Atorvastatin] Other (See Comments)    myalgia  . Codeine Other (See Comments)  . Lovastatin Other (See Comments)  . Pravastatin Sodium Other (See Comments)   Current Outpatient Medications on File Prior to Visit  Medication Sig Dispense Refill  . amLODipine (NORVASC) 5 MG tablet Take 1.5 tablets (7.5 mg total) by mouth daily. 135 tablet 3  . benazepril (LOTENSIN) 40 MG tablet TAKE ONE TABLET BY MOUTH ONCE DAILY 90 tablet 3  . buPROPion (WELLBUTRIN) 75 MG tablet TAKE 1 TABLET BY MOUTH AT BEDTIME 90 tablet 3  . cetirizine (ZYRTEC) 10 MG tablet Take 1 tablet (10 mg total) by mouth daily. 30 tablet 11  . donepezil (ARICEPT) 10 MG tablet TAKE 1 TABLET BY MOUTH ONCE DAILY 90 tablet 3  . guaiFENesin (MUCINEX) 600 MG 12 hr tablet Take 2 tablets (1,200 mg total) by mouth 2 (two) times daily as needed. 60 tablet 1  . levothyroxine (SYNTHROID, LEVOTHROID) 25 MCG tablet Take 0.5 tablets (12.5 mcg total) by mouth daily before breakfast. 30 tablet 0  . nystatin (NYSTATIN) powder USE AS DIRECTED TWICE DAILY AS NEEDED 45 g 0  . pantoprazole (PROTONIX) 40 MG tablet Take 1 tablet by mouth once daily 90  tablet 3  . QUEtiapine (SEROQUEL) 50 MG tablet Take 1 tablet daily 90 tablet 3  . rivaroxaban (XARELTO) 10 MG TABS tablet Take 1 tablet (10 mg total) by mouth daily. 90 tablet 3  . traMADol (ULTRAM) 50 MG tablet Take 1 tablet (50 mg total) by mouth every 8 (eight) hours as needed. for pain 90 tablet 0  . traZODone (DESYREL) 50 MG tablet TAKE 1/2 TO 1 (ONE-HALF TO ONE) TABLET BY MOUTH AT BEDTIME AS NEEDED FOR  SLEEP 90 tablet 1  . triamcinolone (NASACORT) 55 MCG/ACT AERO nasal inhaler Place 2 sprays into the nose daily. 1 Inhaler 12   No current facility-administered medications on file prior to visit.    Observations/Objective: Alert, NAD, appropriate mood and affect, resps normal, cn 2-12 intact, moves all 4s, no visible rash or swelling Lab Results  Component Value Date   WBC 4.0 08/17/2017   HGB 14.1 08/17/2017   HCT 44.4 08/17/2017   PLT 261.0 08/17/2017   GLUCOSE 100 (H) 08/17/2017   CHOL 212 (H) 12/15/2016   TRIG 189.0 (H) 12/15/2016   HDL 31.80 (L) 12/15/2016   LDLDIRECT 139.0 01/01/2016   LDLCALC 142 (H) 12/15/2016   ALT 18 08/17/2017   AST 25 08/17/2017   NA 138 08/17/2017   K 4.2 08/17/2017   CL 104 08/17/2017   CREATININE 0.87 08/17/2017   BUN 17 08/17/2017   CO2 24 08/17/2017   TSH 4.13 08/17/2017   INR 2.43 01/25/2017   HGBA1C 6.4 12/15/2016   Assessment and Plan: See notes  Follow Up Instructions: See notes   I discussed the assessment and treatment plan with the patient. The patient was provided an opportunity to ask questions and all were answered. The patient agreed with the plan and demonstrated an understanding of the instructions.   The patient was advised to call back or seek an in-person evaluation if the symptoms worsen or if the condition fails to improve as anticipated.  [name], MD

## 2019-06-23 NOTE — Telephone Encounter (Signed)
Faxed and confirmed DME order for bed mattress to [facility].  Fax number [phone]

## 2019-06-23 NOTE — Assessment & Plan Note (Addendum)
Ok for rx for mattress replacement to [facility]  I spent 32 minutes in preparing to see the patient by review of recent labs, imaging and procedures, obtaining and reviewing separately obtained history, communicating with the patient and family or caregiver, ordering medications, tests or procedures, and documenting clinical information in the EHR including the differential Dx, treatment, and any further evaluation and other management of stroke hx, dementia, CHF and hyperglycemia

## 2019-06-26 ENCOUNTER — Ambulatory Visit: Payer: Medicare Other | Admitting: Neurology

## 2019-08-30 ENCOUNTER — Other Ambulatory Visit: Payer: Self-pay | Admitting: Internal Medicine

## 2019-10-30 ENCOUNTER — Other Ambulatory Visit: Payer: Self-pay | Admitting: Internal Medicine

## 2019-11-27 ENCOUNTER — Other Ambulatory Visit: Payer: Self-pay | Admitting: Neurology

## 2019-11-27 DIAGNOSIS — F03B18 Unspecified dementia, moderate, with other behavioral disturbance: Secondary | ICD-10-CM

## 2019-11-27 DIAGNOSIS — F0391 Unspecified dementia with behavioral disturbance: Secondary | ICD-10-CM

## 2019-11-27 NOTE — Telephone Encounter (Signed)
Will need to schedule f/u and ok to send refills until then, otherwise she can also just ask PCP if they are ok with refilling these. Thanks

## 2019-11-28 ENCOUNTER — Telehealth: Payer: Self-pay | Admitting: Internal Medicine

## 2019-11-28 NOTE — Telephone Encounter (Signed)
Spoke with daughter [name] and she agreed to schedule pt for a f/u telemedicine visit, refills sent to pharmacy for Seroquel and Wellbutrin.

## 2019-11-28 NOTE — Telephone Encounter (Signed)
    Pt c/o medication issue:  1. Name of Medication: XARELTO 10 MG TABS tablet  2. How are you currently taking this medication (dosage and times per day)? As written  3. Are you having a reaction (difficulty breathing--STAT)? no  4. What is your medication issue? Daughter calling to report medication is too costly.

## 2019-11-28 NOTE — Telephone Encounter (Signed)
Attempted to reach pts daughter, [name]. Was asked to return call after 1:00 PM when she returns from work.

## 2019-11-29 MED ORDER — APIXABAN 5 MG PO TABS: 5.0000 mg | ORAL_TABLET | Freq: Two times a day (BID) | ORAL | 11 refills | Status: DC

## 2019-11-29 NOTE — Telephone Encounter (Signed)
Notified pt/daughter w/MD response../lmb 

## 2019-11-29 NOTE — Telephone Encounter (Signed)
Ok for change to eliquis  Bid, perhaps this is less expensive

## 2020-01-09 ENCOUNTER — Other Ambulatory Visit: Payer: Self-pay | Admitting: Neurology

## 2020-01-09 DIAGNOSIS — F0391 Unspecified dementia with behavioral disturbance: Secondary | ICD-10-CM

## 2020-01-09 DIAGNOSIS — F03B18 Unspecified dementia, moderate, with other behavioral disturbance: Secondary | ICD-10-CM

## 2020-01-22 ENCOUNTER — Telehealth: Payer: Self-pay | Admitting: Emergency Medicine

## 2020-01-22 NOTE — Telephone Encounter (Signed)
Pt daughter called and asked if you give her a call back. She has some questions she would like to ask you about the flu shot and covid booster. Thanks.

## 2020-01-24 NOTE — Telephone Encounter (Signed)
Spoke with pts daughter and she has explained to me she is needing advice on wether or not if her mother needs a flu vaccine and booster COVID shot even though she id bed ridden.  **Sent to Dr. Jenny Reichmann to advise.

## 2020-01-24 NOTE — Telephone Encounter (Addendum)
Follow up message  Heather [name] states patient had second dose of Pfizer vaccine in February 2021. She did not have the exact dates

## 2020-01-24 NOTE — Telephone Encounter (Signed)
Ok for flu shot  I cannot say about the covid booster since I dont have documentation of which covid and when the previous 2 shots were given

## 2020-01-27 NOTE — Telephone Encounter (Signed)
Ok yes, then I would encourage and she would qualify for the Booster shot since it has been over 6 mo since she had the initial 2 shots

## 2020-01-28 ENCOUNTER — Other Ambulatory Visit: Payer: Self-pay | Admitting: Internal Medicine

## 2020-01-31 ENCOUNTER — Other Ambulatory Visit: Payer: Self-pay | Admitting: Neurology

## 2020-01-31 DIAGNOSIS — F03B18 Unspecified dementia, moderate, with other behavioral disturbance: Secondary | ICD-10-CM

## 2020-01-31 DIAGNOSIS — F0391 Unspecified dementia with behavioral disturbance: Secondary | ICD-10-CM

## 2020-02-02 ENCOUNTER — Other Ambulatory Visit: Payer: Self-pay | Admitting: Internal Medicine

## 2020-02-26 ENCOUNTER — Other Ambulatory Visit: Payer: Self-pay | Admitting: Internal Medicine

## 2020-04-01 ENCOUNTER — Other Ambulatory Visit: Payer: Self-pay | Admitting: Internal Medicine

## 2020-04-03 ENCOUNTER — Encounter: Payer: Self-pay | Admitting: Neurology

## 2020-04-03 ENCOUNTER — Other Ambulatory Visit: Payer: Self-pay

## 2020-04-03 ENCOUNTER — Telehealth (INDEPENDENT_AMBULATORY_CARE_PROVIDER_SITE_OTHER): Payer: Medicare Other | Admitting: Neurology

## 2020-04-03 DIAGNOSIS — F0391 Unspecified dementia with behavioral disturbance: Secondary | ICD-10-CM | POA: Diagnosis not present

## 2020-04-03 DIAGNOSIS — F03B18 Unspecified dementia, moderate, with other behavioral disturbance: Secondary | ICD-10-CM

## 2020-04-03 MED ORDER — DONEPEZIL HCL 10 MG PO TABS: ORAL_TABLET | ORAL | 3 refills | Status: DC

## 2020-04-03 MED ORDER — QUETIAPINE FUMARATE 25 MG PO TABS: 25.0000 mg | ORAL_TABLET | Freq: Every day | ORAL | 3 refills | Status: DC

## 2020-04-03 NOTE — Progress Notes (Signed)
Virtual Visit via Video Note The purpose of this virtual visit is to provide medical care while limiting exposure to the novel coronavirus.    Consent was obtained for video visit:  Yes.   Answered questions that patient had about telehealth interaction:  Yes.   I discussed the limitations, risks, security and privacy concerns of performing an evaluation and management service by telemedicine. I also discussed with the patient that there may be a patient responsible charge related to this service. The patient expressed understanding and agreed to proceed.  Pt location: Home Physician Location: office Name of referring provider:  Biagio Borg, MD I connected with [name] at patients initiation/request on 04/03/2020 at  3:00 PM EST by video enabled telemedicine application and verified that I am speaking with the correct person using two identifiers. Pt MRN:  [id number] Pt DOB:  January 22, 1929 Video Participants:  [name]; Heywood Iles (daughter)   History of Present Illness:  The patient was seen as a virtual video visit on 04/03/2020. She was last seen in the neurology clinic over a year ago for dementia with behavioral disturbance. Her daughter [name] provides the history. She has overall been stable this past year. She has been bedbound/wheelchair-bound needing a hoyer lift for transfers. She rolled off the bed 6 months ago. She is able to answer simple questions and follow commands, saying "hi, I'm good." She is not eating a lot at night but eats well in the daytime. She is able to feed herself, but sometimes asks what the food is when it is something obvious. Family manages finances, medications, meals. She is total assist with 24/7 care. Sleep is good. No significant behaviors, she has been off Wellbutrin and Donepezil when they ran out of medication a month ago. She continues on Seroquel 50mg  qhs with no paranoia or hallucinations. She denies any headaches, dizziness, focal  numbness/tingling.    History on Initial Assessment 03/09/2016: This is an 86 yo RH woman with a history of hypertension, hyperlipidemia, encephalocele s/p surgery in 2003, with memory loss and stroke in October 2017. She had seen her PCP on 01/01/16 for memory problems. An MRI brain was ordered, however while waiting for the scan, she started having shortness of breath and was admitted 01/24/16 and found to have DVTs in both lower extremities. She was also complaining of generalized weakness with difficulty ambulating. She was discharged home on Xarelto and home PT. She went for outpatient previously scheduled MRI brain on 02/03/16, which I personally reviewed, showing an acute/subacute small left periatrial infarct without associated hemorrhage. There was a remote moderate-sized right parietal lobe infarct. There was marked chronic microvascular change. Remote frontal craniotomy for repair of anterior encephalocele was seen, with significant surrounding encephalomalacia. There was moderate global atrophy without hydrocephalus. She was instructed to go to the ER, but since she had recent echocardiogram and was started on Xarelto, and had no clear deficits on exam, and she was discharged home with outpatient follow-up.   She reports that her memory has been "on and off" for conversations. She can hold a conversation then just "slacks off." Her daughter has noticed this and feels that she loses interest, cutting off conversations. She endorses some word-finding difficulties. Her daughter states that sometimes it is hard to say about her memory because of her "quirky sense of humor." For instance, this morning she said she did not want breakfast. After her daughter came home, she told her she was hungry, so her daughter  fixed food. Once made, she stated she did not want food. She only repeats herself when they are having conversations in the bathroom at 3am. She had previously been living with her husband until  hospital admission, and has been living with her daughter for the past 2 months. She denied any missed bill payments when she was living with her husband. She is pretty good with remembering to take her morning medications. She stopped driving due to mobility issues 3 years ago. She has been using a wheelchair since recent hospital discharge, however her daughter reports that for the past 2 years, she has not walked a lot because of bilateral leg weakness. She would use a walker and hold on to furniture. She feels her left arm and leg are weaker. She has occasional tingling in both hands. She needs assistance with dressing and bathing. Her daughter reports a couple of falls in the past 2 years, she would tell her daughter about them 1-2 weeks later. She denies any prior history of stroke. She denies any headaches, dizziness, diplopia, dysarthria/dysphagia. No personality changes or hallucinations. She is having some constipation, but her bigger issue is urinary urgency. She has the urge to urinate 4 to 6 times an hour at night, but once she sits on the commode, she does not urinate. During the day, she has the urge around 3 times an hour.     Current Outpatient Medications on File Prior to Visit  Medication Sig Dispense Refill  . amLODipine (NORVASC) 5 MG tablet Take 1.5 tablets (7.5 mg total) by mouth daily. 135 tablet 3  . benazepril (LOTENSIN) 40 MG tablet TAKE ONE TABLET BY MOUTH ONCE DAILY 90 tablet 3  . cetirizine (ZYRTEC) 10 MG tablet Take 1 tablet by mouth once daily 30 tablet 0  . guaiFENesin (MUCINEX) 600 MG 12 hr tablet Take 2 tablets (1,200 mg total) by mouth 2 (two) times daily as needed. 60 tablet 1  . nystatin (NYSTATIN) powder USE AS DIRECTED TWICE DAILY AS NEEDED 45 g 0  . pantoprazole (PROTONIX) 40 MG tablet Take 1 tablet by mouth once daily 90 tablet 3  . traZODone (DESYREL) 50 MG tablet TAKE 1/2 TO 1 (ONE-HALF TO ONE) TABLET BY MOUTH AT BEDTIME AS NEEDED FOR SLEEP 90 tablet 0  .  triamcinolone (NASACORT) 55 MCG/ACT AERO nasal inhaler Place 2 sprays into the nose daily. 1 Inhaler 12  . XARELTO 10 MG TABS tablet Take 1 tablet by mouth once daily 90 tablet 2  . apixaban (ELIQUIS) 5 MG TABS tablet Take 1 tablet (5 mg total) by mouth 2 (two) times daily. (Patient not taking: Reported on 04/03/2020) 60 tablet 11  . levothyroxine (SYNTHROID, LEVOTHROID) 25 MCG tablet Take 0.5 tablets (12.5 mcg total) by mouth daily before breakfast. (Patient not taking: Reported on 04/03/2020) 30 tablet 0  . traMADol (ULTRAM) 50 MG tablet Take 1 tablet (50 mg total) by mouth every 8 (eight) hours as needed. for pain (Patient not taking: Reported on 04/03/2020) 90 tablet 0   No current facility-administered medications on file prior to visit.     Observations/Objective:   Vitals:   04/03/20 1320  Weight: 160 lb (72.6 kg)  Height: 5' (1.524 m)   GEN:  The patient appears stated age and is in NAD.  Neurological examination: Patient is awake, alert, answers simple questions. She knows the month. When asked about year, she says "what do you mean?" and says May, then 2002 with prompting. She is unable to name  pen or cup, saying she cannot see it on the video. No aphasia or dysarthria. Reduced fluency. Cranial nerves: Extraocular movements intact with no nystagmus. No facial asymmetry. Motor: moves all extremities symmetrically, at least anti-gravity x 4. No incoordination on finger to nose testing.  Assessment and Plan:   This is an 86 yo RH woman with a history of hypertension, hyperlipidemia, encephalocele s/p surgery in 2003, subcortical stroke in October 2017, with moderate dementia with behavioral disturbance. MOCA blind (done over phone) 6/22 in 11/2018. Symptoms have been stable, refills sent for Donepezil 10mg  daily. She has not been having significant behavioral issues, we agreed to hold off on restarting Wellbutrin and will reduce Seroquel to 25mg  qhs, hopefully we will be able to continue  streamlining medications. Continue 24/7 care. Follow-up in 1 year, they know to call for any changes.    Follow Up Instructions:   -I discussed the assessment and treatment plan with the patient/daughter. The patient/daughter were provided an opportunity to ask questions and all were answered. The patient/daughter agreed with the plan and demonstrated an understanding of the instructions.   The patient was advised to call back or seek an in-person evaluation if the symptoms worsen or if the condition fails to improve as anticipated.      Cameron Sprang, MD

## 2020-04-09 ENCOUNTER — Other Ambulatory Visit: Payer: Self-pay | Admitting: Internal Medicine

## 2020-04-09 NOTE — Telephone Encounter (Signed)
Please refill as per office routine med refill policy (all routine meds refilled for 3 mo or monthly per pt preference up to one year from last visit, then month to month grace period for 3 mo, then further med refills will have to be denied)  

## 2020-05-01 ENCOUNTER — Other Ambulatory Visit: Payer: Self-pay | Admitting: Internal Medicine

## 2020-05-30 ENCOUNTER — Emergency Department (HOSPITAL_COMMUNITY)
Admission: EM | Admit: 2020-05-30 | Discharge: 2020-05-30 | Disposition: A | Discharge status: Home / Self Care | Payer: Medicare Other | Attending: Emergency Medicine | Admitting: Emergency Medicine

## 2020-05-30 ENCOUNTER — Emergency Department (HOSPITAL_COMMUNITY): Payer: Medicare Other

## 2020-05-30 DIAGNOSIS — R55 Syncope and collapse: Secondary | ICD-10-CM | POA: Diagnosis not present

## 2020-05-30 DIAGNOSIS — F0391 Unspecified dementia with behavioral disturbance: Secondary | ICD-10-CM | POA: Insufficient documentation

## 2020-05-30 DIAGNOSIS — R112 Nausea with vomiting, unspecified: Secondary | ICD-10-CM | POA: Insufficient documentation

## 2020-05-30 DIAGNOSIS — Z79899 Other long term (current) drug therapy: Secondary | ICD-10-CM | POA: Diagnosis not present

## 2020-05-30 DIAGNOSIS — I130 Hypertensive heart and chronic kidney disease with heart failure and stage 1 through stage 4 chronic kidney disease, or unspecified chronic kidney disease: Secondary | ICD-10-CM | POA: Diagnosis not present

## 2020-05-30 DIAGNOSIS — R4020 Unspecified coma: Secondary | ICD-10-CM | POA: Diagnosis not present

## 2020-05-30 DIAGNOSIS — R001 Bradycardia, unspecified: Secondary | ICD-10-CM | POA: Insufficient documentation

## 2020-05-30 DIAGNOSIS — Z86718 Personal history of other venous thrombosis and embolism: Secondary | ICD-10-CM | POA: Insufficient documentation

## 2020-05-30 DIAGNOSIS — N183 Chronic kidney disease, stage 3 unspecified: Secondary | ICD-10-CM | POA: Diagnosis not present

## 2020-05-30 DIAGNOSIS — M2550 Pain in unspecified joint: Secondary | ICD-10-CM | POA: Diagnosis not present

## 2020-05-30 DIAGNOSIS — R079 Chest pain, unspecified: Secondary | ICD-10-CM | POA: Diagnosis not present

## 2020-05-30 DIAGNOSIS — R5381 Other malaise: Secondary | ICD-10-CM | POA: Diagnosis not present

## 2020-05-30 DIAGNOSIS — Z7401 Bed confinement status: Secondary | ICD-10-CM | POA: Diagnosis not present

## 2020-05-30 DIAGNOSIS — K449 Diaphragmatic hernia without obstruction or gangrene: Secondary | ICD-10-CM | POA: Diagnosis not present

## 2020-05-30 DIAGNOSIS — R4182 Altered mental status, unspecified: Secondary | ICD-10-CM | POA: Diagnosis not present

## 2020-05-30 DIAGNOSIS — I5030 Unspecified diastolic (congestive) heart failure: Secondary | ICD-10-CM | POA: Diagnosis not present

## 2020-05-30 DIAGNOSIS — J9811 Atelectasis: Secondary | ICD-10-CM | POA: Diagnosis not present

## 2020-05-30 DIAGNOSIS — I517 Cardiomegaly: Secondary | ICD-10-CM | POA: Diagnosis not present

## 2020-05-30 DIAGNOSIS — R569 Unspecified convulsions: Secondary | ICD-10-CM | POA: Diagnosis not present

## 2020-05-30 DIAGNOSIS — R404 Transient alteration of awareness: Secondary | ICD-10-CM | POA: Diagnosis not present

## 2020-05-30 LAB — COMPREHENSIVE METABOLIC PANEL
ALT: 26 U/L (ref 0–44)
AST: 34 U/L (ref 15–41)
Albumin: 2.9 g/dL — ABNORMAL LOW (ref 3.5–5.0)
Alkaline Phosphatase: 100 U/L (ref 38–126)
Anion gap: 9 (ref 5–15)
BUN: 16 mg/dL (ref 8–23)
CO2: 26 mmol/L (ref 22–32)
Calcium: 9.2 mg/dL (ref 8.9–10.3)
Chloride: 106 mmol/L (ref 98–111)
Creatinine, Ser: 0.93 mg/dL (ref 0.44–1.00)
GFR, Estimated: 59 mL/min — ABNORMAL LOW (ref >60)
Glucose, Bld: 183 mg/dL — ABNORMAL HIGH (ref 70–99)
Potassium: 3.4 mmol/L — ABNORMAL LOW (ref 3.5–5.1)
Sodium: 141 mmol/L (ref 135–145)
Total Bilirubin: 0.5 mg/dL (ref 0.3–1.2)
Total Protein: 6.5 g/dL (ref 6.5–8.1)

## 2020-05-30 LAB — CBC WITH DIFFERENTIAL/PLATELET
Abs Immature Granulocytes: 0.02 10*3/uL (ref 0.00–0.07)
Basophils Absolute: 0.1 10*3/uL (ref 0.0–0.1)
Basophils Relative: 1 %
Eosinophils Absolute: 0.1 10*3/uL (ref 0.0–0.5)
Eosinophils Relative: 2 %
HCT: 41.6 % (ref 36.0–46.0)
Hemoglobin: 13 g/dL (ref 12.0–15.0)
Immature Granulocytes: 0 %
Lymphocytes Relative: 18 %
Lymphs Abs: 1 10*3/uL (ref 0.7–4.0)
MCH: 26.9 pg (ref 26.0–34.0)
MCHC: 31.3 g/dL (ref 30.0–36.0)
MCV: 86.1 fL (ref 80.0–100.0)
Monocytes Absolute: 0.6 10*3/uL (ref 0.1–1.0)
Monocytes Relative: 10 %
Neutro Abs: 4.1 10*3/uL (ref 1.7–7.7)
Neutrophils Relative %: 69 %
Platelets: 307 10*3/uL (ref 150–400)
RBC: 4.83 MIL/uL (ref 3.87–5.11)
RDW: 16.3 % — ABNORMAL HIGH (ref 11.5–15.5)
WBC: 5.8 10*3/uL (ref 4.0–10.5)
nRBC: 0 % (ref 0.0–0.2)

## 2020-05-30 LAB — TROPONIN I (HIGH SENSITIVITY)
Troponin I (High Sensitivity): 8 ng/L (ref <18)
Troponin I (High Sensitivity): 6 ng/L (ref <18)

## 2020-05-30 LAB — CBG MONITORING, ED: Glucose-Capillary: 85 mg/dL (ref 70–99)

## 2020-05-30 MED ORDER — SODIUM CHLORIDE 0.9 % IV BOLUS
1000.0000 mL | Freq: Once | INTRAVENOUS | Status: AC
  Administered 2020-05-30: 1000 mL via INTRAVENOUS

## 2020-05-30 MED ORDER — SODIUM CHLORIDE 0.9 % IV SOLN: INTRAVENOUS | Status: DC

## 2020-05-30 NOTE — ED Provider Notes (Signed)
[facility] EMERGENCY DEPARTMENT Provider Note   CSN: [id number] Arrival date & time: 05/30/20  0827     History Chief Complaint  Patient presents with  . Loss of Consciousness    Heather [name] is a 87 y.o. female.  HPI   Patient presents to the ED for evaluation after a syncopal episode.  History was provided by the patient's daughter who was present during the episode as well as the patient.  The daughter states the patient has history of dementia and is mostly nonambulatory but does sit at the breakfast table to eat.  The daughter states that she was finishing up her breakfast when she suddenly slumped over.  This lasted maybe about 3 to 4 minutes.  The daughter did try to wake her up but the patient did not respond.  She did have an episode of nausea and vomiting.  By the time EMS arrived the patient was alert and awake and back to her baseline.  The daughter states the patient's had may be 6-8 episodes like this over the last couple of years.  These episodes always occur with eating.  Patient often will have an episode of vomiting.  She will also tend to have bowel movement afterwards.  Patient was admitted to the hospital back in March 2019 for a similar episode.  According to the records during that evaluation patient was noted to have an episode of bradycardia in the ER to the 40s.  They discontinued her Coreg.  Patient had another episode the following month.  She was evaluated in the ED and subsequently released.  Daughter states she has not come back to the emergency room since that time.  She has not had any blood tests recently.  Right now the patient states she feels great.  She denies having any chest pain or abdominal pain.  She is alert and awake and answering questions appropriately.  Daughter states she is at her baseline now.  Past Medical History:  Diagnosis Date  . ALLERGIC RHINITIS 12/09/2006  . DEGENERATIVE JOINT DISEASE, LEFT KNEE 01/23/2007  .  Encephalocele ([place]) 01/23/2007  . FEVER UNSPECIFIED 01/28/2010  . GERD 12/09/2006  . GLUCOSE INTOLERANCE 01/24/2009  . GOUT 12/09/2006  . HYPERCHOLESTEROLEMIA 12/09/2006  . HYPERLIPIDEMIA 12/18/2006  . HYPERTENSION 12/09/2006  . Impaired glucose tolerance 07/26/2010  . MENOPAUSAL DISORDER 01/25/2008  . Stroke [facility])     Patient Active Problem List   Diagnosis Date Noted  . Abnormal TSH 08/17/2017  . Bradycardia, drug induced   . Syncope, cardiogenic 06/23/2017  . Sinus bradycardia 06/23/2017  . History of completed stroke 06/23/2017  . Subclinical hypothyroidism 06/23/2017  . Nausea without vomiting 02/16/2017  . Facial droop   . Transient alteration of awareness   . Acute metabolic encephalopathy 77/82/4235  . Seizure ([place]) 01/25/2017  . Adhesive capsulitis of left shoulder 01/02/2017  . Acute bilateral low back pain without sciatica 01/02/2017  . Gait disorder 12/17/2016  . Do not resuscitate discussion 12/17/2016  . Right shoulder pain 09/25/2016  . Insomnia 08/16/2016  . Dysuria 08/16/2016  . Chronic hypoxemic respiratory failure ([place]) 06/12/2016  . Nocturia 03/17/2016  . Mild dementia ([place]) 03/09/2016  . Stroke syndrome 03/09/2016  . CKD (chronic kidney disease), stage III ([place]) 02/05/2016  . Rash 02/05/2016  . Personal history of DVT (deep vein thrombosis) 01/28/2016  . Diastolic CHF, chronic ([place]) 01/28/2016  . Acute kidney injury ([place])   . Pulmonary embolus ([place])   . Acute respiratory failure  with hypoxia ([place]) 01/24/2016  . Dementia with behavioral disturbance ([place]) 01/01/2016  . Non compliance w medication regimen 01/01/2016  . General weakness 01/01/2016  . Peripheral edema 01/01/2016  . Chronic venous insufficiency 08/01/2014  . Fall at home 08/01/2013  . Osteopenia 08/01/2013  . Left knee pain 01/29/2011  . Preventative health care 07/26/2010  . Impaired glucose tolerance 07/26/2010  . MENOPAUSAL DISORDER 01/25/2008  . Osteoarthrosis involving lower leg  01/23/2007  . Encephalocele ([place]) 01/23/2007  . Hyperlipidemia 12/18/2006  . GOUT 12/09/2006  . Essential hypertension 12/09/2006  . Allergic rhinitis 12/09/2006  . GERD 12/09/2006    Past Surgical History:  Procedure Laterality Date  . s/p anterior encephalocele repair and craniotomy  2003     OB History   No obstetric history on file.     Family History  Problem Relation Age of Onset  . Hypertension Other     Social History   Tobacco Use  . Smoking status: Never Smoker  . Smokeless tobacco: Never Used  Vaping Use  . Vaping Use: Never used  Substance Use Topics  . Alcohol use: No  . Drug use: No    Home Medications Prior to Admission medications   Medication Sig Start Date End Date Taking? Authorizing Provider  amLODipine (NORVASC) 5 MG tablet Take 1.5 tablets (7.5 mg total) by mouth daily. 03/29/19   Biagio Borg, MD  apixaban (ELIQUIS) 5 MG TABS tablet Take 1 tablet (5 mg total) by mouth 2 (two) times daily. Patient not taking: Reported on 04/03/2020 11/29/19   Biagio Borg, MD  benazepril (LOTENSIN) 40 MG tablet TAKE ONE TABLET BY MOUTH ONCE DAILY 02/01/17   Biagio Borg, MD  cetirizine (ZYRTEC) 10 MG tablet Take 1 tablet by mouth once daily 05/01/20   Biagio Borg, MD  donepezil (ARICEPT) 10 MG tablet TAKE 1 TABLET BY MOUTH ONCE DAILY 04/03/20   Cameron Sprang, MD  guaiFENesin (MUCINEX) 600 MG 12 hr tablet Take 2 tablets (1,200 mg total) by mouth 2 (two) times daily as needed. 01/31/19   Biagio Borg, MD  levothyroxine (SYNTHROID, LEVOTHROID) 25 MCG tablet Take 0.5 tablets (12.5 mcg total) by mouth daily before breakfast. Patient not taking: Reported on 04/03/2020 06/28/17   Velvet Bathe, MD  nystatin (NYSTATIN) powder USE AS DIRECTED TWICE DAILY AS NEEDED 02/02/20   Biagio Borg, MD  pantoprazole (PROTONIX) 40 MG tablet Take 1 tablet by mouth once daily 04/09/20   Biagio Borg, MD  QUEtiapine (SEROQUEL) 25 MG tablet Take 1 tablet (25 mg total) by mouth at  bedtime. 04/03/20   Cameron Sprang, MD  traMADol (ULTRAM) 50 MG tablet Take 1 tablet (50 mg total) by mouth every 8 (eight) hours as needed. for pain Patient not taking: Reported on 04/03/2020 04/12/18   Plotnikov, Evie Lacks, MD  traZODone (DESYREL) 50 MG tablet TAKE 1/2 TO 1 (ONE-HALF TO ONE) TABLET BY MOUTH AT BEDTIME AS NEEDED FOR SLEEP 05/01/20   Biagio Borg, MD  triamcinolone (NASACORT) 55 MCG/ACT AERO nasal inhaler Place 2 sprays into the nose daily. 01/31/19   Biagio Borg, MD  XARELTO 10 MG TABS tablet Take 1 tablet by mouth once daily 08/30/19   Biagio Borg, MD    Allergies    Lipitor [atorvastatin], Codeine, Lovastatin, and Pravastatin sodium  Review of Systems   Review of Systems  All other systems reviewed and are negative.   Physical Exam Updated Vital Signs BP 118/81  Pulse 94   Temp 97.7 F (36.5 C) (Oral)   Resp 17   SpO2 97%   Physical Exam Vitals and nursing note reviewed.  Constitutional:      Appearance: She is well-developed and well-nourished. She is not toxic-appearing or diaphoretic.  HENT:     Head: Normocephalic and atraumatic.     Right Ear: External ear normal.     Left Ear: External ear normal.  Eyes:     General: No scleral icterus.       Right eye: No discharge.        Left eye: No discharge.     Conjunctiva/sclera: Conjunctivae normal.  Neck:     Trachea: No tracheal deviation.  Cardiovascular:     Rate and Rhythm: Normal rate and regular rhythm.     Pulses: Intact distal pulses.  Pulmonary:     Effort: Pulmonary effort is normal. No respiratory distress.     Breath sounds: Normal breath sounds. No stridor. No wheezing or rales.  Abdominal:     General: Bowel sounds are normal. There is no distension.     Palpations: Abdomen is soft.     Tenderness: There is no abdominal tenderness. There is no guarding or rebound.  Musculoskeletal:        General: No tenderness or edema.     Cervical back: Neck supple.  Skin:    General: Skin  is warm and dry.     Findings: No rash.  Neurological:     General: No focal deficit present.     Mental Status: She is alert.     Cranial Nerves: No cranial nerve deficit (no facial droop, extraocular movements intact, no slurred speech).     Sensory: No sensory deficit.     Motor: No abnormal muscle tone or seizure activity.     Coordination: Coordination normal.     Deep Tendon Reflexes: Strength normal.     Comments: Pt able to lift legs bilaterally, lift arms bilaterally  Psychiatric:        Mood and Affect: Mood and affect normal.     ED Results / Procedures / Treatments   Labs (all labs ordered are listed, but only abnormal results are displayed) Labs Reviewed  CBC WITH DIFFERENTIAL/PLATELET - Abnormal; Notable for the following components:      Result Value   RDW 16.3 (*)    All other components within normal limits  COMPREHENSIVE METABOLIC PANEL - Abnormal; Notable for the following components:   Potassium 3.4 (*)    Glucose, Bld 183 (*)    Albumin 2.9 (*)    GFR, Estimated 59 (*)    All other components within normal limits  CBG MONITORING, ED  TROPONIN I (HIGH SENSITIVITY)  TROPONIN I (HIGH SENSITIVITY)    EKG EKG Interpretation  Date/Time:  Thursday May 30 2020 09:03:23 EST Ventricular Rate:  85 PR Interval:    QRS Duration: 101 QT Interval:  438 QTC Calculation: 521 R Axis:   23 Text Interpretation: Sinus rhythm Borderline prolonged PR interval Probable anteroseptal infarct, similar to prior eCG Lateral leads are also involved Prolonged QT interval  new since last tracing Baseline wander in lead(s) V5 Poor data quality Confirmed by Dorie Rank [phone]) on 05/30/2020 9:10:52 AM   Radiology CT HEAD WO CONTRAST  Result Date: 05/30/2020 CLINICAL DATA:  Mental status changes EXAM: CT HEAD WITHOUT CONTRAST TECHNIQUE: Contiguous axial images were obtained from the base of the skull through the vertex without intravenous contrast. COMPARISON:  01/25/2017  FINDINGS: Brain: Old bifrontal infarcts and right frontoparietal encephalomalacia, stable. No acute infarcts. There is atrophy and chronic small vessel disease changes. No hemorrhage or hydrocephalus. Vascular: No hyperdense vessel or unexpected calcification. Skull: Prior right frontal craniotomy. No acute calvarial abnormality. Sinuses/Orbits: No acute findings Other: None IMPRESSION: Bifrontal and right fronto parietal encephalomalacia, stable. Atrophy, chronic microvascular disease. No acute intracranial abnormality. Electronically Signed   By: Rolm Baptise M.D.   On: 05/30/2020 11:04   DG Chest Port 1 View  Result Date: 05/30/2020 CLINICAL DATA:  Chest pain EXAM: PORTABLE CHEST 1 VIEW COMPARISON:  2019 FINDINGS: No new consolidation or edema. No pleural effusion or pneumothorax. Large hiatal hernia with adjacent atelectasis. Similar cardiomegaly. IMPRESSION: No acute process in the chest. Unchanged cardiomegaly and large hiatal hernia. Electronically Signed   By: Macy Mis M.D.   On: 05/30/2020 10:18    Procedures Procedures   Medications Ordered in ED Medications  sodium chloride 0.9 % bolus 1,000 mL (0 mLs Intravenous Stopped 05/30/20 1339)    And  0.9 %  sodium chloride infusion (has no administration in time range)    ED Course  I have reviewed the triage vital signs and the nursing notes.  Pertinent labs & imaging results that were available during my care of the patient were reviewed by me and considered in my medical decision making (see chart for details).  Clinical Course as of 05/30/20 1517  Thu May 30, 2020  1154 First troponin normal. [JK]    Clinical Course User Index [JK] Dorie Rank, MD   MDM Rules/Calculators/A&P                          Patient presented to the ED for evaluation of a syncopal episode.  Patient has had similar episodes in the past.  Patient lives at home with her daughter who watches her very closely.  Daughter states all these episodes have  always been with eating.  They are not frequent but she has had several over the years.  This episode occurred with eating today.  Patient has recovered and is back to her baseline.  Unclear etiology.  At this time she has no focal neurologic deficits.  Labs are normal.  No signs to suggest acute cardiac injury.  Patient is not hypotensive and does not have a dysrhythmia here.  Possible she could be having some aspiration issues that possibly could be triggering vasovagal episodes.  Is also possible she could be having some episodes of cardiac dysrhythmia although that was not observed here in the ED.  Patient has been admitted previously and had a work-up.  I discussed options of having the patient stay in the hospital overnight for further evaluation versus outpatient follow-up.  Patient is certainly at risk considering her age but I am also reassured by the fact that this has occurred multiple times over the years and the patient has always recovered without any difficulties.  Patient and daughter opted to go home with outpatient follow-up.  Warning signs precautions discussed. Final Clinical Impression(s) / ED Diagnoses Final diagnoses:  Syncope and collapse    Rx / DC Orders ED Discharge Orders    None       Dorie Rank, MD 05/30/20 1520

## 2020-05-30 NOTE — ED Notes (Signed)
[facility] aware [phone]

## 2020-05-30 NOTE — ED Triage Notes (Signed)
Pt arrives from home where she lives with her daughter she called ems because she was sitting at the breakfast table and slumped over not responding for around 3-4 minutes per ems. She stayed in the chair for this entire event. She has dementia and is at her baseline per ems she is alert and ox2.

## 2020-05-30 NOTE — Discharge Instructions (Addendum)
Follow up with your doctor for further evaluation to discuss possible outpatient cardiac monitoring, swallowing evaluation.   Return to the ED for recurrent symptoms, fevers, chills, confusion, weakness or other concerns

## 2020-06-02 ENCOUNTER — Other Ambulatory Visit: Payer: Self-pay | Admitting: Internal Medicine

## 2020-06-02 NOTE — Telephone Encounter (Signed)
Please refill as per office routine med refill policy (all routine meds refilled for 3 mo or monthly per pt preference up to one year from last visit, then month to month grace period for 3 mo, then further med refills will have to be denied)  

## 2020-06-04 ENCOUNTER — Telehealth (INDEPENDENT_AMBULATORY_CARE_PROVIDER_SITE_OTHER): Payer: Medicare Other | Admitting: Internal Medicine

## 2020-06-04 DIAGNOSIS — I10 Essential (primary) hypertension: Secondary | ICD-10-CM

## 2020-06-04 DIAGNOSIS — R7302 Impaired glucose tolerance (oral): Secondary | ICD-10-CM | POA: Diagnosis not present

## 2020-06-04 DIAGNOSIS — J309 Allergic rhinitis, unspecified: Secondary | ICD-10-CM

## 2020-06-04 MED ORDER — MONTELUKAST SODIUM 10 MG PO TABS: 10.0000 mg | ORAL_TABLET | Freq: Every day | ORAL | 11 refills | Status: DC

## 2020-06-04 NOTE — Progress Notes (Signed)
Patient ID: Heather [name], female   DOB: Jun 12, 1928, 87 y.o.   MRN: [id number] .  Virtual Visit via Video Note  I connected with Heather [name] on 06/04/20 at  2:40 PM EST by a video enabled telemedicine application and verified that I am speaking with the correct person using two identifiers.  Location of all participants today Patient: at home Provider: at office   I discussed the limitations of evaluation and management by telemedicine and the availability of in person appointments. The patient expressed understanding and agreed to proceed.  History of Present Illness: Here with  2-3 days acute onset facial pain, pressure, headache, general weakness and malaise, and clear d/c, with mild ST and cough, but pt denies chest pain, wheezing, increased sob or doe, orthopnea, PND, increased LE swelling, palpitations, though has had recurrent dizzy episodes , last seen in ED feb 17 with neg cxr, ct head, labs.   Has ongoing allergy, taking mucinex, no fever, no bedsores . Past Medical History:  Diagnosis Date  . ALLERGIC RHINITIS 12/09/2006  . DEGENERATIVE JOINT DISEASE, LEFT KNEE 01/23/2007  . Encephalocele ([place]) 01/23/2007  . FEVER UNSPECIFIED 01/28/2010  . GERD 12/09/2006  . GLUCOSE INTOLERANCE 01/24/2009  . GOUT 12/09/2006  . HYPERCHOLESTEROLEMIA 12/09/2006  . HYPERLIPIDEMIA 12/18/2006  . HYPERTENSION 12/09/2006  . Impaired glucose tolerance 07/26/2010  . MENOPAUSAL DISORDER 01/25/2008  . Stroke [facility]    Past Surgical History:  Procedure Laterality Date  . s/p anterior encephalocele repair and craniotomy  2003    reports that she has never smoked. She has never used smokeless tobacco. She reports that she does not drink alcohol and does not use drugs. family history includes Hypertension in an other family member. Allergies  Allergen Reactions  . Lipitor [Atorvastatin] Other (See Comments)    myalgia  . Codeine Other (See Comments)  . Lovastatin Other (See Comments)  . Pravastatin  Sodium Other (See Comments)   Current Outpatient Medications on File Prior to Visit  Medication Sig Dispense Refill  . amLODipine (NORVASC) 5 MG tablet TAKE 1 & 1/2 (ONE & ONE-HALF) TABLETS BY MOUTH DAILY 135 tablet 0  . apixaban (ELIQUIS) 5 MG TABS tablet Take 1 tablet (5 mg total) by mouth 2 (two) times daily. (Patient not taking: Reported on 04/03/2020) 60 tablet 11  . benazepril (LOTENSIN) 40 MG tablet TAKE ONE TABLET BY MOUTH ONCE DAILY 90 tablet 3  . cetirizine (ZYRTEC) 10 MG tablet Take 1 tablet by mouth once daily 30 tablet 0  . donepezil (ARICEPT) 10 MG tablet TAKE 1 TABLET BY MOUTH ONCE DAILY 90 tablet 3  . guaiFENesin (MUCINEX) 600 MG 12 hr tablet Take 2 tablets (1,200 mg total) by mouth 2 (two) times daily as needed. 60 tablet 1  . levothyroxine (SYNTHROID, LEVOTHROID) 25 MCG tablet Take 0.5 tablets (12.5 mcg total) by mouth daily before breakfast. (Patient not taking: Reported on 04/03/2020) 30 tablet 0  . nystatin (NYSTATIN) powder USE AS DIRECTED TWICE DAILY AS NEEDED 45 g 0  . pantoprazole (PROTONIX) 40 MG tablet Take 1 tablet by mouth once daily 90 tablet 0  . QUEtiapine (SEROQUEL) 25 MG tablet Take 1 tablet (25 mg total) by mouth at bedtime. 90 tablet 3  . traMADol (ULTRAM) 50 MG tablet Take 1 tablet (50 mg total) by mouth every 8 (eight) hours as needed. for pain (Patient not taking: Reported on 04/03/2020) 90 tablet 0  . traZODone (DESYREL) 50 MG tablet TAKE 1/2 TO 1 (ONE-HALF TO ONE) TABLET  BY MOUTH AT BEDTIME AS NEEDED FOR SLEEP 90 tablet 0  . triamcinolone (NASACORT) 55 MCG/ACT AERO nasal inhaler Place 2 sprays into the nose daily. 1 Inhaler 12  . XARELTO 10 MG TABS tablet Take 1 tablet by mouth once daily 90 tablet 2   No current facility-administered medications on file prior to visit.    Observations/Objective: Alert, NAD, appropriate mood and affect, resps normal, cn 2-12 intact, moves all 4s, no visible rash or swelling Lab Results  Component Value Date   WBC 5.8  05/30/2020   HGB 13.0 05/30/2020   HCT 41.6 05/30/2020   PLT 307 05/30/2020   GLUCOSE 183 (H) 05/30/2020   CHOL 212 (H) 12/15/2016   TRIG 189.0 (H) 12/15/2016   HDL 31.80 (L) 12/15/2016   LDLDIRECT 139.0 01/01/2016   LDLCALC 142 (H) 12/15/2016   ALT 26 05/30/2020   AST 34 05/30/2020   NA 141 05/30/2020   K 3.4 (L) 05/30/2020   CL 106 05/30/2020   CREATININE 0.93 05/30/2020   BUN 16 05/30/2020   CO2 26 05/30/2020   TSH 4.13 08/17/2017   INR 2.43 01/25/2017   HGBA1C 6.4 12/15/2016   Assessment and Plan: See notes  Follow Up Instructions: See notes   I discussed the assessment and treatment plan with the patient. The patient was provided an opportunity to ask questions and all were answered. The patient agreed with the plan and demonstrated an understanding of the instructions.   The patient was advised to call back or seek an in-person evaluation if the symptoms worsen or if the condition fails to improve as anticipated.   [name], MD

## 2020-06-10 ENCOUNTER — Encounter: Payer: Self-pay | Admitting: Internal Medicine

## 2020-06-10 NOTE — Patient Instructions (Signed)
Please take all new medication as prescribed - the singulair, and restart nasacort

## 2020-06-10 NOTE — Assessment & Plan Note (Signed)
Mild to mod, for restart nasacort, and add singulair 10 qd,,  to f/u any worsening symptoms or concerns

## 2020-06-10 NOTE — Assessment & Plan Note (Signed)
Cont to monitor at home and next visit

## 2020-06-10 NOTE — Assessment & Plan Note (Signed)
Lab Results  Component Value Date   HGBA1C 6.4 12/15/2016   Stable, pt to continue current medical treatment  - diet

## 2020-06-12 ENCOUNTER — Other Ambulatory Visit: Payer: Self-pay | Admitting: Internal Medicine

## 2020-07-08 ENCOUNTER — Other Ambulatory Visit: Payer: Self-pay | Admitting: Internal Medicine

## 2020-07-22 ENCOUNTER — Other Ambulatory Visit: Payer: Self-pay

## 2020-07-22 ENCOUNTER — Observation Stay (HOSPITAL_COMMUNITY)
Admission: EM | Admit: 2020-07-22 | Discharge: 2020-07-26 | Disposition: A | Discharge status: Home / Self Care | Payer: Medicare Other | Attending: Internal Medicine | Admitting: Internal Medicine

## 2020-07-22 ENCOUNTER — Encounter (HOSPITAL_COMMUNITY): Payer: Self-pay

## 2020-07-22 DIAGNOSIS — R0789 Other chest pain: Secondary | ICD-10-CM | POA: Diagnosis not present

## 2020-07-22 DIAGNOSIS — I10 Essential (primary) hypertension: Secondary | ICD-10-CM | POA: Diagnosis not present

## 2020-07-22 DIAGNOSIS — K50111 Crohn's disease of large intestine with rectal bleeding: Secondary | ICD-10-CM

## 2020-07-22 DIAGNOSIS — R079 Chest pain, unspecified: Secondary | ICD-10-CM | POA: Diagnosis not present

## 2020-07-22 DIAGNOSIS — F03918 Unspecified dementia, unspecified severity, with other behavioral disturbance: Secondary | ICD-10-CM | POA: Diagnosis present

## 2020-07-22 DIAGNOSIS — Z20822 Contact with and (suspected) exposure to covid-19: Secondary | ICD-10-CM | POA: Diagnosis not present

## 2020-07-22 DIAGNOSIS — E876 Hypokalemia: Secondary | ICD-10-CM | POA: Insufficient documentation

## 2020-07-22 DIAGNOSIS — K625 Hemorrhage of anus and rectum: Secondary | ICD-10-CM | POA: Diagnosis not present

## 2020-07-22 DIAGNOSIS — K922 Gastrointestinal hemorrhage, unspecified: Secondary | ICD-10-CM

## 2020-07-22 DIAGNOSIS — Z7901 Long term (current) use of anticoagulants: Secondary | ICD-10-CM | POA: Diagnosis not present

## 2020-07-22 DIAGNOSIS — K529 Noninfective gastroenteritis and colitis, unspecified: Secondary | ICD-10-CM | POA: Diagnosis not present

## 2020-07-22 DIAGNOSIS — D124 Benign neoplasm of descending colon: Secondary | ICD-10-CM

## 2020-07-22 DIAGNOSIS — K635 Polyp of colon: Secondary | ICD-10-CM | POA: Diagnosis not present

## 2020-07-22 DIAGNOSIS — E039 Hypothyroidism, unspecified: Secondary | ICD-10-CM | POA: Diagnosis not present

## 2020-07-22 DIAGNOSIS — F0391 Unspecified dementia with behavioral disturbance: Secondary | ICD-10-CM | POA: Diagnosis not present

## 2020-07-22 DIAGNOSIS — R58 Hemorrhage, not elsewhere classified: Secondary | ICD-10-CM | POA: Diagnosis not present

## 2020-07-22 DIAGNOSIS — I5032 Chronic diastolic (congestive) heart failure: Secondary | ICD-10-CM | POA: Diagnosis not present

## 2020-07-22 DIAGNOSIS — I110 Hypertensive heart disease with heart failure: Secondary | ICD-10-CM | POA: Diagnosis not present

## 2020-07-22 DIAGNOSIS — Z79899 Other long term (current) drug therapy: Secondary | ICD-10-CM | POA: Insufficient documentation

## 2020-07-22 DIAGNOSIS — K921 Melena: Secondary | ICD-10-CM | POA: Diagnosis present

## 2020-07-22 DIAGNOSIS — Z8673 Personal history of transient ischemic attack (TIA), and cerebral infarction without residual deficits: Secondary | ICD-10-CM

## 2020-07-22 DIAGNOSIS — D123 Benign neoplasm of transverse colon: Secondary | ICD-10-CM

## 2020-07-22 LAB — COMPREHENSIVE METABOLIC PANEL
ALT: 12 U/L (ref 0–44)
AST: 20 U/L (ref 15–41)
Albumin: 3 g/dL — ABNORMAL LOW (ref 3.5–5.0)
Alkaline Phosphatase: 96 U/L (ref 38–126)
Anion gap: 7 (ref 5–15)
BUN: 21 mg/dL (ref 8–23)
CO2: 26 mmol/L (ref 22–32)
Calcium: 8.7 mg/dL — ABNORMAL LOW (ref 8.9–10.3)
Chloride: 106 mmol/L (ref 98–111)
Creatinine, Ser: 0.86 mg/dL (ref 0.44–1.00)
GFR, Estimated: >60 mL/min (ref >60)
Glucose, Bld: 90 mg/dL (ref 70–99)
Potassium: 3.8 mmol/L (ref 3.5–5.1)
Sodium: 139 mmol/L (ref 135–145)
Total Bilirubin: 1 mg/dL (ref 0.3–1.2)
Total Protein: 6.7 g/dL (ref 6.5–8.1)

## 2020-07-22 LAB — CBC WITH DIFFERENTIAL/PLATELET
Abs Immature Granulocytes: 0.04 10*3/uL (ref 0.00–0.07)
Basophils Absolute: 0.1 10*3/uL (ref 0.0–0.1)
Basophils Relative: 0 %
Eosinophils Absolute: 0.1 10*3/uL (ref 0.0–0.5)
Eosinophils Relative: 1 %
HCT: 39.2 % (ref 36.0–46.0)
Hemoglobin: 12 g/dL (ref 12.0–15.0)
Immature Granulocytes: 0 %
Lymphocytes Relative: 19 %
Lymphs Abs: 2.3 10*3/uL (ref 0.7–4.0)
MCH: 26.7 pg (ref 26.0–34.0)
MCHC: 30.6 g/dL (ref 30.0–36.0)
MCV: 87.1 fL (ref 80.0–100.0)
Monocytes Absolute: 1.1 10*3/uL — ABNORMAL HIGH (ref 0.1–1.0)
Monocytes Relative: 9 %
Neutro Abs: 8.3 10*3/uL — ABNORMAL HIGH (ref 1.7–7.7)
Neutrophils Relative %: 71 %
Platelets: 394 10*3/uL (ref 150–400)
RBC: 4.5 MIL/uL (ref 3.87–5.11)
RDW: 17.9 % — ABNORMAL HIGH (ref 11.5–15.5)
WBC: 11.9 10*3/uL — ABNORMAL HIGH (ref 4.0–10.5)
nRBC: 0 % (ref 0.0–0.2)

## 2020-07-22 LAB — PROTIME-INR
INR: 0.9 (ref 0.8–1.2)
Prothrombin Time: 12.2 seconds (ref 11.4–15.2)

## 2020-07-22 LAB — SAMPLE TO BLOOD BANK

## 2020-07-22 LAB — POC OCCULT BLOOD, ED: Fecal Occult Bld: POSITIVE — AB

## 2020-07-22 NOTE — ED Triage Notes (Signed)
Pt to ED from home with c/o possible GI bleed. Per EMS pt passed a very hard BM then had diarrhea, noted to have some mild bleeding (bright red) throughout stool. Denies any pain. Pt is taking xarelto. Arrives A+O to [place], VSS, NADN.

## 2020-07-22 NOTE — ED Provider Notes (Signed)
[facility] DEPT Provider Note: Georgena Spurling, MD, FACEP  CSN: [id number] MRN: [id number] ARRIVAL: 07/22/20 at 2128 ROOM: [place]  GI Bleeding   HISTORY OF PRESENT ILLNESS  07/22/20 10:49 PM Heather [name] is a 87 y.o. female who is cared for by her daughter who is at the bedside and is the chief historian.  The patient had a large bowel movement yesterday evening followed by 2 episodes of diarrhea.  Her daughter noted the diarrhea to have a slightly unusual color ("like cooked sweet potatoes with specks of orange in it") but did not notice any gross blood.  Today throughout the day she had several grossly bloody bowel movements into her diaper.  This was blood by itself and not mixed with stool.  The volume of blood was small to medium.  It was somewhat congealed but not containing clots.  The patient denies any abdominal pain, rectal pain, nausea or vomiting.  Her daughter did give her an Imodium tablet yesterday evening along with her evening Xarelto.  She has not had her evening medications tonight.   Past Medical History:  Diagnosis Date  . ALLERGIC RHINITIS 12/09/2006  . DEGENERATIVE JOINT DISEASE, LEFT KNEE 01/23/2007  . Encephalocele ([place]) 01/23/2007  . FEVER UNSPECIFIED 01/28/2010  . GERD 12/09/2006  . GLUCOSE INTOLERANCE 01/24/2009  . GOUT 12/09/2006  . HYPERCHOLESTEROLEMIA 12/09/2006  . HYPERLIPIDEMIA 12/18/2006  . HYPERTENSION 12/09/2006  . Impaired glucose tolerance 07/26/2010  . MENOPAUSAL DISORDER 01/25/2008  . Stroke [facility])     Past Surgical History:  Procedure Laterality Date  . s/p anterior encephalocele repair and craniotomy  2003    Family History  Problem Relation Age of Onset  . Hypertension Other     Social History   Tobacco Use  . Smoking status: Never Smoker  . Smokeless tobacco: Never Used  Vaping Use  . Vaping Use: Never used  Substance Use Topics  . Alcohol use: No  . Drug use: No    Prior to Admission medications    Medication Sig Start Date End Date Taking? Authorizing Provider  amLODipine (NORVASC) 5 MG tablet TAKE 1 & 1/2 (ONE & ONE-HALF) TABLETS BY MOUTH DAILY 06/05/20   Biagio Borg, MD  benazepril (LOTENSIN) 40 MG tablet TAKE ONE TABLET BY MOUTH ONCE DAILY 02/01/17   Biagio Borg, MD  cetirizine (ZYRTEC) 10 MG tablet Take 1 tablet by mouth once daily 07/08/20   Biagio Borg, MD  donepezil (ARICEPT) 10 MG tablet TAKE 1 TABLET BY MOUTH ONCE DAILY 04/03/20   Cameron Sprang, MD  guaiFENesin (MUCINEX) 600 MG 12 hr tablet Take 2 tablets (1,200 mg total) by mouth 2 (two) times daily as needed. 01/31/19   Biagio Borg, MD  montelukast (SINGULAIR) 10 MG tablet Take 1 tablet (10 mg total) by mouth daily. 06/04/20   Biagio Borg, MD  nystatin (NYSTATIN) powder USE AS DIRECTED TWICE DAILY AS NEEDED 02/02/20   Biagio Borg, MD  pantoprazole (PROTONIX) 40 MG tablet Take 1 tablet by mouth once daily 04/09/20   Biagio Borg, MD  QUEtiapine (SEROQUEL) 25 MG tablet Take 1 tablet (25 mg total) by mouth at bedtime. 04/03/20   Cameron Sprang, MD  traZODone (DESYREL) 50 MG tablet TAKE 1/2 TO 1 (ONE-HALF TO ONE) TABLET BY MOUTH AT BEDTIME AS NEEDED FOR SLEEP 05/01/20   Biagio Borg, MD  triamcinolone (NASACORT) 55 MCG/ACT AERO nasal inhaler Place 2 sprays into the nose daily.  01/31/19   Biagio Borg, MD  XARELTO 10 MG TABS tablet Take 1 tablet by mouth once daily 06/12/20   Biagio Borg, MD  apixaban (ELIQUIS) 5 MG TABS tablet Take 1 tablet (5 mg total) by mouth 2 (two) times daily. Patient not taking: Reported on 04/03/2020 11/29/19 07/22/20  Biagio Borg, MD  levothyroxine (SYNTHROID, LEVOTHROID) 25 MCG tablet Take 0.5 tablets (12.5 mcg total) by mouth daily before breakfast. Patient not taking: Reported on 04/03/2020 06/28/17 07/22/20  Velvet Bathe, MD    Allergies Lipitor [atorvastatin], Codeine, Lovastatin, and Pravastatin sodium   REVIEW OF SYSTEMS  Negative except as noted here or in the History of Present  Illness.   PHYSICAL EXAMINATION  Initial Vital Signs Blood pressure (!) 141/63, pulse 75, temperature 98.4 F (36.9 C), temperature source Oral, resp. rate 16, height 5' (1.524 m), weight 72 kg, SpO2 99 %.  Examination General: Well-developed, well-nourished female in no acute distress; appearance consistent with age of record HENT: normocephalic; atraumatic Eyes: pupils equal, round and reactive to light; extraocular muscles grossly intact Neck: supple Heart: regular rate and rhythm Lungs: clear to auscultation bilaterally Abdomen: soft; nondistended; nontender; bowel sounds present Rectal: Nontender, nonthrombosed external hemorrhoids without active bleeding; gross blood high in rectal vault Extremities: No deformity; full range of motion; pulses normal Neurologic: Awake, alert; motor function intact in all extremities and symmetric; no facial droop Skin: Warm and dry Psychiatric: Flat affect   RESULTS  Summary of this visit's results, reviewed and interpreted by myself:   EKG Interpretation  Date/Time:    Ventricular Rate:    PR Interval:    QRS Duration:   QT Interval:    QTC Calculation:   R Axis:     Text Interpretation:        Laboratory Studies: Results for orders placed or performed during the hospital encounter of 07/22/20 (from the past 24 hour(s))  CBC with Differential     Status: Abnormal   Collection Time: 07/22/20 10:38 PM  Result Value Ref Range   WBC 11.9 (H) 4.0 - 10.5 K/uL   RBC 4.50 3.87 - 5.11 MIL/uL   Hemoglobin 12.0 12.0 - 15.0 g/dL   HCT 39.2 36.0 - 46.0 %   MCV 87.1 80.0 - 100.0 fL   MCH 26.7 26.0 - 34.0 pg   MCHC 30.6 30.0 - 36.0 g/dL   RDW 17.9 (H) 11.5 - 15.5 %   Platelets 394 150 - 400 K/uL   nRBC 0.0 0.0 - 0.2 %   Neutrophils Relative % 71 %   Neutro Abs 8.3 (H) 1.7 - 7.7 K/uL   Lymphocytes Relative 19 %   Lymphs Abs 2.3 0.7 - 4.0 K/uL   Monocytes Relative 9 %   Monocytes Absolute 1.1 (H) 0.1 - 1.0 K/uL   Eosinophils Relative  1 %   Eosinophils Absolute 0.1 0.0 - 0.5 K/uL   Basophils Relative 0 %   Basophils Absolute 0.1 0.0 - 0.1 K/uL   Immature Granulocytes 0 %   Abs Immature Granulocytes 0.04 0.00 - 0.07 K/uL  Comprehensive metabolic panel     Status: Abnormal   Collection Time: 07/22/20 10:38 PM  Result Value Ref Range   Sodium 139 135 - 145 mmol/L   Potassium 3.8 3.5 - 5.1 mmol/L   Chloride 106 98 - 111 mmol/L   CO2 26 22 - 32 mmol/L   Glucose, Bld 90 70 - 99 mg/dL   BUN 21 8 - 23 mg/dL  Creatinine, Ser 0.86 0.44 - 1.00 mg/dL   Calcium 8.7 (L) 8.9 - 10.3 mg/dL   Total Protein 6.7 6.5 - 8.1 g/dL   Albumin 3.0 (L) 3.5 - 5.0 g/dL   AST 20 15 - 41 U/L   ALT 12 0 - 44 U/L   Alkaline Phosphatase 96 38 - 126 U/L   Total Bilirubin 1.0 0.3 - 1.2 mg/dL   GFR, Estimated >60 >60 mL/min   Anion gap 7 5 - 15  Protime-INR     Status: None   Collection Time: 07/22/20 10:38 PM  Result Value Ref Range   Prothrombin Time 12.2 11.4 - 15.2 seconds   INR 0.9 0.8 - 1.2  Sample to Blood Bank     Status: None   Collection Time: 07/22/20 10:38 PM  Result Value Ref Range   Blood Bank Specimen SAMPLE AVAILABLE FOR TESTING    Sample Expiration      07/25/2020,2359 Performed at [facility], [place] [address] [place], [place] [address]   POC occult blood, ED Provider will collect     Status: Abnormal   Collection Time: 07/22/20 11:12 PM  Result Value Ref Range   Fecal Occult Bld POSITIVE (A) NEGATIVE   Imaging Studies: No results found.  ED COURSE and MDM  Nursing notes, initial and subsequent vitals signs, including pulse oximetry, reviewed and interpreted by myself.  Vitals:   07/22/20 2307 07/23/20 0000 07/23/20 0100 07/23/20 0120  BP: (!) 156/88 110/76 (!) 150/61 117/71  Pulse: 77 76 73 78  Resp: 16 16 16 16   Temp:      TempSrc:      SpO2: 99% 99% 98% 95%  Weight:      Height:       Medications - No data to display  1:36 AM Will have patient admitted for lower GI bleed.  The  blood appears to be coming from higher than her hemorrhoids and she will likely need a colonoscopy.  PROCEDURES  Procedures   ED DIAGNOSES     ICD-10-CM   1. Lower GI bleed  K92.2        Lundon Verdejo, MD 07/23/20 [phone]

## 2020-07-23 ENCOUNTER — Other Ambulatory Visit: Payer: Self-pay

## 2020-07-23 DIAGNOSIS — F0151 Vascular dementia with behavioral disturbance: Secondary | ICD-10-CM | POA: Diagnosis not present

## 2020-07-23 DIAGNOSIS — I5032 Chronic diastolic (congestive) heart failure: Secondary | ICD-10-CM

## 2020-07-23 DIAGNOSIS — K921 Melena: Secondary | ICD-10-CM | POA: Diagnosis not present

## 2020-07-23 DIAGNOSIS — K922 Gastrointestinal hemorrhage, unspecified: Secondary | ICD-10-CM | POA: Diagnosis not present

## 2020-07-23 DIAGNOSIS — Z7901 Long term (current) use of anticoagulants: Secondary | ICD-10-CM | POA: Diagnosis not present

## 2020-07-23 DIAGNOSIS — Z8673 Personal history of transient ischemic attack (TIA), and cerebral infarction without residual deficits: Secondary | ICD-10-CM | POA: Diagnosis not present

## 2020-07-23 DIAGNOSIS — I10 Essential (primary) hypertension: Secondary | ICD-10-CM

## 2020-07-23 LAB — CBC WITH DIFFERENTIAL/PLATELET
Abs Immature Granulocytes: 0.03 10*3/uL (ref 0.00–0.07)
Basophils Absolute: 0.1 10*3/uL (ref 0.0–0.1)
Basophils Relative: 1 %
Eosinophils Absolute: 0.2 10*3/uL (ref 0.0–0.5)
Eosinophils Relative: 1 %
HCT: 40.5 % (ref 36.0–46.0)
Hemoglobin: 12.3 g/dL (ref 12.0–15.0)
Immature Granulocytes: 0 %
Lymphocytes Relative: 17 %
Lymphs Abs: 2.1 10*3/uL (ref 0.7–4.0)
MCH: 26.8 pg (ref 26.0–34.0)
MCHC: 30.4 g/dL (ref 30.0–36.0)
MCV: 88.2 fL (ref 80.0–100.0)
Monocytes Absolute: 1 10*3/uL (ref 0.1–1.0)
Monocytes Relative: 9 %
Neutro Abs: 8.6 10*3/uL — ABNORMAL HIGH (ref 1.7–7.7)
Neutrophils Relative %: 72 %
Platelets: 356 10*3/uL (ref 150–400)
RBC: 4.59 MIL/uL (ref 3.87–5.11)
RDW: 17.9 % — ABNORMAL HIGH (ref 11.5–15.5)
WBC: 11.9 10*3/uL — ABNORMAL HIGH (ref 4.0–10.5)
nRBC: 0 % (ref 0.0–0.2)

## 2020-07-23 LAB — BASIC METABOLIC PANEL
Anion gap: 9 (ref 5–15)
BUN: 21 mg/dL (ref 8–23)
CO2: 25 mmol/L (ref 22–32)
Calcium: 8.9 mg/dL (ref 8.9–10.3)
Chloride: 108 mmol/L (ref 98–111)
Creatinine, Ser: 0.82 mg/dL (ref 0.44–1.00)
GFR, Estimated: >60 mL/min (ref >60)
Glucose, Bld: 99 mg/dL (ref 70–99)
Potassium: 3.5 mmol/L (ref 3.5–5.1)
Sodium: 142 mmol/L (ref 135–145)

## 2020-07-23 LAB — HEMOGLOBIN AND HEMATOCRIT, BLOOD
HCT: 37.7 % (ref 36.0–46.0)
HCT: 36.4 % (ref 36.0–46.0)
Hemoglobin: 12 g/dL (ref 12.0–15.0)
Hemoglobin: 11.2 g/dL — ABNORMAL LOW (ref 12.0–15.0)

## 2020-07-23 LAB — MAGNESIUM: Magnesium: 2.2 mg/dL (ref 1.7–2.4)

## 2020-07-23 LAB — SARS CORONAVIRUS 2 (TAT 6-24 HRS): SARS Coronavirus 2: NEGATIVE

## 2020-07-23 MED ORDER — TRIAMCINOLONE ACETONIDE 55 MCG/ACT NA AERO: 2.0000 | INHALATION_SPRAY | Freq: Every day | NASAL | Status: DC

## 2020-07-23 MED ORDER — DONEPEZIL HCL 10 MG PO TABS
10.0000 mg | ORAL_TABLET | Freq: Every day | ORAL | Status: DC
  Administered 2020-07-23 – 2020-07-25 (×3): 10 mg via ORAL
  Filled 2020-07-23 (×3): qty 1

## 2020-07-23 MED ORDER — FLUTICASONE PROPIONATE 50 MCG/ACT NA SUSP
2.0000 | Freq: Every day | NASAL | Status: DC
  Administered 2020-07-23 – 2020-07-25 (×3): 2 via NASAL
  Filled 2020-07-23 (×2): qty 16

## 2020-07-23 MED ORDER — TRAZODONE HCL 50 MG PO TABS
25.0000 mg | ORAL_TABLET | Freq: Every evening | ORAL | Status: DC | PRN
  Administered 2020-07-23: 25 mg via ORAL
  Filled 2020-07-23: qty 1

## 2020-07-23 MED ORDER — QUETIAPINE FUMARATE 25 MG PO TABS
25.0000 mg | ORAL_TABLET | Freq: Every day | ORAL | Status: DC
  Administered 2020-07-23 – 2020-07-25 (×3): 25 mg via ORAL
  Filled 2020-07-23 (×3): qty 1

## 2020-07-23 MED ORDER — SODIUM CHLORIDE 0.9 % IV SOLN: INTRAVENOUS | Status: DC

## 2020-07-23 MED ORDER — SODIUM CHLORIDE 0.9% FLUSH
3.0000 mL | Freq: Two times a day (BID) | INTRAVENOUS | Status: DC
  Administered 2020-07-23 – 2020-07-26 (×6): 3 mL via INTRAVENOUS

## 2020-07-23 MED ORDER — AMLODIPINE BESYLATE 10 MG PO TABS
10.0000 mg | ORAL_TABLET | Freq: Every day | ORAL | Status: DC
  Administered 2020-07-24 – 2020-07-26 (×3): 10 mg via ORAL
  Filled 2020-07-23 (×3): qty 1

## 2020-07-23 NOTE — Consult Note (Addendum)
Consultation  Referring Provider: [facility]/ Pokhrel Primary Care Physician:  Biagio Borg, MD Primary Gastroenterologist:  none  Reason for Consultation: Bloody stool  HPI: Heather [name] is a 87 y.o. female, who was admitted last evening through the emergency room after her daughter found bloody stool in patient's depends.  Patient has not had any prior GI history, no prior colonoscopy. She has history of CVA and is nonambulatory.  She has been on Xarelto over the past 4 years.  Also with history of GERD, hypertension, dementia, degenerative joint disease.  And hyperlipidemia. Rectal exam in the ER confirmed hemorrhoids and heme positive stool. Last Xarelto was taken on 07/21/2020.  Initial labs revealed hemoglobin of 12/INR 0.9/BUN 21/creatinine 0.86  Review of labs showed hemoglobin was 13 in February 2022. This a.m. hemoglobin stable at 12.3 hematocrit 40.5.  No abdominal imaging has been done.  Patient has not had any further bowel movements or bleeding since admission.  She denies any abdominal pain or discomfort, denies any rectal pain or discomfort, no nausea or vomiting.  Confirmed no prior history of GI bleeding and no prior colonoscopy. Family at bedside says that her bowel movements have been more regular recently as she has been on an herbal supplement.   Past Medical History:  Diagnosis Date  . ALLERGIC RHINITIS 12/09/2006  . DEGENERATIVE JOINT DISEASE, LEFT KNEE 01/23/2007  . Encephalocele ([place]) 01/23/2007  . FEVER UNSPECIFIED 01/28/2010  . GERD 12/09/2006  . GLUCOSE INTOLERANCE 01/24/2009  . GOUT 12/09/2006  . HYPERCHOLESTEROLEMIA 12/09/2006  . HYPERLIPIDEMIA 12/18/2006  . HYPERTENSION 12/09/2006  . Impaired glucose tolerance 07/26/2010  . MENOPAUSAL DISORDER 01/25/2008  . Stroke [facility])     Past Surgical History:  Procedure Laterality Date  . s/p anterior encephalocele repair and craniotomy  2003    Prior to Admission medications   Medication Sig Start Date End  Date Taking? Authorizing Provider  amLODipine (NORVASC) 5 MG tablet TAKE 1 & 1/2 (ONE & ONE-HALF) TABLETS BY MOUTH DAILY 06/05/20  Yes Biagio Borg, MD  benazepril (LOTENSIN) 40 MG tablet TAKE ONE TABLET BY MOUTH ONCE DAILY 02/01/17  Yes Biagio Borg, MD  cetirizine (ZYRTEC) 10 MG tablet Take 1 tablet by mouth once daily 07/08/20  Yes Biagio Borg, MD  donepezil (ARICEPT) 10 MG tablet TAKE 1 TABLET BY MOUTH ONCE DAILY 04/03/20  Yes Cameron Sprang, MD  guaiFENesin (MUCINEX) 600 MG 12 hr tablet Take 2 tablets (1,200 mg total) by mouth 2 (two) times daily as needed. 01/31/19  Yes Biagio Borg, MD  montelukast (SINGULAIR) 10 MG tablet Take 1 tablet (10 mg total) by mouth daily. 06/04/20  Yes Biagio Borg, MD  nystatin (NYSTATIN) powder USE AS DIRECTED TWICE DAILY AS NEEDED 02/02/20  Yes Biagio Borg, MD  pantoprazole (PROTONIX) 40 MG tablet Take 1 tablet by mouth once daily 04/09/20  Yes Biagio Borg, MD  QUEtiapine (SEROQUEL) 25 MG tablet Take 1 tablet (25 mg total) by mouth at bedtime. 04/03/20  Yes Cameron Sprang, MD  traZODone (DESYREL) 50 MG tablet TAKE 1/2 TO 1 (ONE-HALF TO ONE) TABLET BY MOUTH AT BEDTIME AS NEEDED FOR SLEEP 05/01/20  Yes Biagio Borg, MD  triamcinolone (NASACORT) 55 MCG/ACT AERO nasal inhaler Place 2 sprays into the nose daily. 01/31/19  Yes Biagio Borg, MD  XARELTO 10 MG TABS tablet Take 1 tablet by mouth once daily 06/12/20   Biagio Borg, MD  apixaban (ELIQUIS) 5 MG TABS  tablet Take 1 tablet (5 mg total) by mouth 2 (two) times daily. Patient not taking: Reported on 04/03/2020 11/29/19 07/22/20  Biagio Borg, MD  levothyroxine (SYNTHROID, LEVOTHROID) 25 MCG tablet Take 0.5 tablets (12.5 mcg total) by mouth daily before breakfast. Patient not taking: Reported on 04/03/2020 06/28/17 07/22/20  Velvet Bathe, MD    Current Facility-Administered Medications  Medication Dose Route Frequency Provider Last Rate Last Admin  . 0.9 %  sodium chloride infusion   Intravenous  Continuous Dwyane Dee, MD 75 mL/hr at 07/23/20 0413 New Bag at 07/23/20 0413  . [START ON 07/24/2020] amLODipine (NORVASC) tablet 10 mg  10 mg Oral Daily Pokhrel, Laxman, MD      . donepezil (ARICEPT) tablet 10 mg  10 mg Oral QHS Pokhrel, Laxman, MD      . fluticasone (FLONASE) 50 MCG/ACT nasal spray 2 spray  2 spray Each Nare Daily Pokhrel, Laxman, MD      . QUEtiapine (SEROQUEL) tablet 25 mg  25 mg Oral QHS Pokhrel, Laxman, MD      . sodium chloride flush (NS) 0.9 % injection 3 mL  3 mL Intravenous Q12H Dwyane Dee, MD      . traZODone (DESYREL) tablet 25 mg  25 mg Oral QHS PRN Flora Lipps, MD        Allergies as of 07/22/2020 - Review Complete 06/10/2020  Allergen Reaction Noted  . Lipitor [atorvastatin] Other (See Comments) 02/01/2013  . Codeine Other (See Comments) 12/09/2006  . Lovastatin Other (See Comments) 12/09/2006  . Pravastatin sodium Other (See Comments) 07/26/2009    Family History  Problem Relation Age of Onset  . Hypertension Other     Social History   Socioeconomic History  . Marital status: Married    Spouse name: Not on file  . Number of children: 7  . Years of education: Not on file  . Highest education level: Not on file  Occupational History  . Occupation: Musician  Tobacco Use  . Smoking status: Never Smoker  . Smokeless tobacco: Never Used  Vaping Use  . Vaping Use: Never used  Substance and Sexual Activity  . Alcohol use: No  . Drug use: No  . Sexual activity: Never  Other Topics Concern  . Not on file  Social History Narrative   Right handed   Drinks no caffeine   One story home   Social Determinants of Health   Financial Resource Strain: Not on file  Food Insecurity: Not on file  Transportation Needs: Not on file  Physical Activity: Not on file  Stress: Not on file  Social Connections: Not on file  Intimate Partner Violence: Not on file    Review of Systems: Pertinent positive and negative review of  systems were noted in the above HPI section.  All other review of systems was otherwise negative.Marland Kitchen  Physical Exam: Vital signs in last 24 hours: Temp:  [98.1 F (36.7 C)-98.4 F (36.9 C)] 98.1 F (36.7 C) (04/12 0842) Pulse Rate:  [56-79] 56 (04/12 0842) Resp:  [16-20] 20 (04/12 0842) BP: (110-156)/(61-113) 148/113 (04/12 0842) SpO2:  [95 %-100 %] 100 % (04/12 0842) Weight:  [72 kg] 72 kg (04/11 2134) Last BM Date: 07/23/20 General:   Alert, well-developed, well-nourished very elderly African-American female,, pleasant and cooperative in NAD Head:  Normocephalic and atraumatic. Eyes:  Sclera clear, no icterus.  Conjunctiva pink. Ears:  Normal auditory acuity. Nose:  No deformity, discharge,  or lesions. Mouth:  No deformity or lesions.  Neck:  Supple; no masses or thyromegaly. Lungs:  Clear throughout to auscultation.   No wheezes, crackles, or rhonchi. Heart:  irRegular rate and rhythm; no murmurs, clicks, rubs,  or gallops. Abdomen:  Soft,nontender, BS active,nonpalp mass or hsm.   Rectal: Small noninflamed external hemorrhoid, on digital exam there is some mucousy pinkish heme, no palpable mass or internal hemorrhoids Msk:  Symmetrical without gross deformities. . Pulses:  Normal pulses noted. Extremities:  Without clubbing or edema. Neurologic:  Alert and  oriented x3  Skin:  Intact without significant lesions or rashes.. Psych:  Alert and cooperative. Normal mood and affect.  Intake/Output from previous day: 04/11 0701 - 04/12 0700 In: 75 [I.V.:75] Out: -  Intake/Output this shift: No intake/output data recorded.  Lab Results: Recent Labs    07/22/20 2238 07/23/20 0428  WBC 11.9* 11.9*  HGB 12.0 12.3  HCT 39.2 40.5  PLT 394 356   BMET Recent Labs    07/22/20 2238 07/23/20 0428  NA 139 142  K 3.8 3.5  CL 106 108  CO2 26 25  GLUCOSE 90 99  BUN 21 21  CREATININE 0.86 0.82  CALCIUM 8.7* 8.9   LFT Recent Labs    07/22/20 2238  PROT 6.7  ALBUMIN 3.0*   AST 20  ALT 12  ALKPHOS 96  BILITOT 1.0   PT/INR Recent Labs    07/22/20 2238  LABPROT 12.2  INR 0.9    IMPRESSION:  50.  87 year old African-American female admitted with presumed lower GI bleeding after bloody bowel movement at home yesterday.  This occurred in the setting of Xarelto  Fortunately no further active bleeding since admission and hemoglobin very stable On rectal exam she does have some mucoid heme, no palpable lesion on digital exam but bleeding may be secondary to internal hemorrhoids.  No prior colonoscopies and therefore cannot rule out other occult source for GI bleeding i.e. occult neoplasm, diverticulosis  2.  Chronic anticoagulation-on Xarelto 3.  History of CVA-nonambulatory 4.  Hypertension 5.  Mild dementia  Plan:  Start full liquid diet Continue serial hemoglobins and transfuse as indicated Continue to hold Xarelto Had discussion with the patient's daughter and another daughter by phone today regarding work-up.  As she has not had prior colonoscopy, colonoscopy should be considered, though at her advanced age realize this will be with increased risk.  Family would like to discuss decision regarding colonoscopy and will let us know later in the day. If colonoscopy not to be done, will be a good idea to get a CT of the abdomen pelvis to rule out a neoplasm.  Thank you, we will follow along   Amy EsterwoodPA-C  07/23/2020, 8:49 AM      Attending Physician Note   I have taken a history, examined the patient and reviewed the chart. I agree with the Advanced Practitioner's note, impression and recommendations.  Painless hematochezia on Xarelto c/w LGI bleed and no bleeding since admission.  Colonoscopy is generally the next step in evaluating. Due to her advanced age the patient and her daughters are reluctant but are considering colonoscopy. If they decline would proceed with CT AP to further evaluate. Continue to hold Xarelto and trend CBC.     Lucio Edward, MD FACG [phone]

## 2020-07-23 NOTE — H&P (Signed)
History and Physical    Heather [name]  ATF:[id number]  DOB: 1928/04/16  DOA: 07/22/2020  PCP: Biagio Borg, MD Patient coming from: home  Chief Complaint: rectal bleeding  HPI:  Heather [name] is an 87 yo female with PMH CVA (residual LUE weakness and does not walk; on Xarelto), HTN, HLD, gout, GERD, DJD who presented to the hospital after her daughter noticed that her adult briefs had bright red blood mixed with stool in them.  History is obtained mostly from the daughter who is bedside in the ER.  Patient has been living with her daughter for the past 3 years approximately.  Patient has been on Xarelto for approximately 4 years from her stroke. They deny any similar episode in the past of blood noticed in her briefs.  They are unsure of if and when the patient's last colonoscopy was. The last bloody episode noted was approximately 6 PM prior to coming to the ER.  In the ER she underwent rectal exam by the EDP and was noted to be FOBT positive with presence of hemorrhoids however no visible/obvious bleeding was explained by her hemorrhoid presence and etiology was thought to be more proximal.  Hgb on initial evaluation was 12 g/dL and she is hemodynamically stable. She last took Xarelto on Sunday evening.   She is admitted for further GIB workup and Hgb monitoring.    I have personally briefly reviewed patient's old medical records in [facility] and discussed patient with the ER provider when appropriate/indicated.  Assessment/Plan: * GIB (gastrointestinal bleeding) -Daughter states patient has been living with her for approximately 3 years and they are unsure if she has ever had a colonoscopy.  She declined referral to GI by her PCP a few years ago as well -Daughter reporting bright red blood noted in her previous.  FOBT positive in ER -Hemoglobin 12 g/dL.  Continue trending hemoglobin -GI consulted on admission -Keep n.p.o. for now - continue IVF  History of CVA  (cerebrovascular accident) - residual deficits include LUE weakness and daughter states patient does not walk anymore - hold Xarelto for now until GIB workup complete  Diastolic CHF, chronic (HCC) - no s/s exacerbation - last echo 06/24/17: EF 55-60%, Gr 1 DD, no RWMA, no AS  Dementia with behavioral disturbance (HCC) - currently cooperative and pleasant - follow up med rec and resume meds as able   Essential hypertension - hold home meds for now in setting of GIB     Code Status:     Code Status: Full Code  DVT Prophylaxis:   SCDs Start: 07/23/20 0400   Anticipated disposition is to: home  History: Past Medical History:  Diagnosis Date  . ALLERGIC RHINITIS 12/09/2006  . DEGENERATIVE JOINT DISEASE, LEFT KNEE 01/23/2007  . Encephalocele ([employer] [facility] [employer]) 01/23/2007  . FEVER UNSPECIFIED 01/28/2010  . GERD 12/09/2006  . GLUCOSE INTOLERANCE 01/24/2009  . GOUT 12/09/2006  . HYPERCHOLESTEROLEMIA 12/09/2006  . HYPERLIPIDEMIA 12/18/2006  . HYPERTENSION 12/09/2006  . Impaired glucose tolerance 07/26/2010  . MENOPAUSAL DISORDER 01/25/2008  . Stroke [facility])     Past Surgical History:  Procedure Laterality Date  . s/p anterior encephalocele repair and craniotomy  2003     reports that she has never smoked. She has never used smokeless tobacco. She reports that she does not drink alcohol and does not use drugs.  Allergies  Allergen Reactions  . Lipitor [Atorvastatin] Other (See Comments)    myalgia  . Codeine Other (See Comments)  . Lovastatin  Other (See Comments)  . Pravastatin Sodium Other (See Comments)    Family History  Problem Relation Age of Onset  . Hypertension Other    Home Medications: Prior to Admission medications   Medication Sig Start Date End Date Taking? Authorizing Provider  amLODipine (NORVASC) 5 MG tablet TAKE 1 & 1/2 (ONE & ONE-HALF) TABLETS BY MOUTH DAILY 06/05/20  Yes Biagio Borg, MD  benazepril (LOTENSIN) 40 MG tablet TAKE ONE TABLET BY MOUTH ONCE DAILY 02/01/17   Yes Biagio Borg, MD  cetirizine (ZYRTEC) 10 MG tablet Take 1 tablet by mouth once daily 07/08/20  Yes Biagio Borg, MD  donepezil (ARICEPT) 10 MG tablet TAKE 1 TABLET BY MOUTH ONCE DAILY 04/03/20  Yes Cameron Sprang, MD  guaiFENesin (MUCINEX) 600 MG 12 hr tablet Take 2 tablets (1,200 mg total) by mouth 2 (two) times daily as needed. 01/31/19  Yes Biagio Borg, MD  montelukast (SINGULAIR) 10 MG tablet Take 1 tablet (10 mg total) by mouth daily. 06/04/20  Yes Biagio Borg, MD  nystatin (NYSTATIN) powder USE AS DIRECTED TWICE DAILY AS NEEDED 02/02/20  Yes Biagio Borg, MD  pantoprazole (PROTONIX) 40 MG tablet Take 1 tablet by mouth once daily 04/09/20  Yes Biagio Borg, MD  QUEtiapine (SEROQUEL) 25 MG tablet Take 1 tablet (25 mg total) by mouth at bedtime. 04/03/20  Yes Cameron Sprang, MD  traZODone (DESYREL) 50 MG tablet TAKE 1/2 TO 1 (ONE-HALF TO ONE) TABLET BY MOUTH AT BEDTIME AS NEEDED FOR SLEEP 05/01/20  Yes Biagio Borg, MD  triamcinolone (NASACORT) 55 MCG/ACT AERO nasal inhaler Place 2 sprays into the nose daily. 01/31/19  Yes Biagio Borg, MD  XARELTO 10 MG TABS tablet Take 1 tablet by mouth once daily 06/12/20   Biagio Borg, MD  apixaban (ELIQUIS) 5 MG TABS tablet Take 1 tablet (5 mg total) by mouth 2 (two) times daily. Patient not taking: Reported on 04/03/2020 11/29/19 07/22/20  Biagio Borg, MD  levothyroxine (SYNTHROID, LEVOTHROID) 25 MCG tablet Take 0.5 tablets (12.5 mcg total) by mouth daily before breakfast. Patient not taking: Reported on 04/03/2020 06/28/17 07/22/20  Velvet Bathe, MD    Review of Systems:  Review of systems not obtained due to patient factors. Mild cognitive impairment   Physical Exam: Vitals:   07/23/20 0000 07/23/20 0100 07/23/20 0120 07/23/20 0200  BP: 110/76 (!) 150/61 117/71 (!) 127/91  Pulse: 76 73 78 79  Resp: 16 16 16 16   Temp:      TempSrc:      SpO2: 99% 98% 95% 97%  Weight:      Height:       General appearance: alert and no  distress Head: Normocephalic, without obvious abnormality, atraumatic Eyes: EOMI Lungs: clear to auscultation bilaterally Heart: regular rate and rhythm and S1, S2 normal Abdomen: normal findings: bowel sounds normal and soft, non-tender Extremities: no edema Skin: mobility and turgor normal Neurologic: 4/5 LUE strength; 3+/5 LE strength, sensation intact throughout   Labs on Admission:  I have personally reviewed following labs and imaging studies Results for orders placed or performed during the hospital encounter of 07/22/20 (from the past 24 hour(s))  CBC with Differential     Status: Abnormal   Collection Time: 07/22/20 10:38 PM  Result Value Ref Range   WBC 11.9 (H) 4.0 - 10.5 K/uL   RBC 4.50 3.87 - 5.11 MIL/uL   Hemoglobin 12.0 12.0 - 15.0 g/dL   HCT 39.2  36.0 - 46.0 %   MCV 87.1 80.0 - 100.0 fL   MCH 26.7 26.0 - 34.0 pg   MCHC 30.6 30.0 - 36.0 g/dL   RDW 17.9 (H) 11.5 - 15.5 %   Platelets 394 150 - 400 K/uL   nRBC 0.0 0.0 - 0.2 %   Neutrophils Relative % 71 %   Neutro Abs 8.3 (H) 1.7 - 7.7 K/uL   Lymphocytes Relative 19 %   Lymphs Abs 2.3 0.7 - 4.0 K/uL   Monocytes Relative 9 %   Monocytes Absolute 1.1 (H) 0.1 - 1.0 K/uL   Eosinophils Relative 1 %   Eosinophils Absolute 0.1 0.0 - 0.5 K/uL   Basophils Relative 0 %   Basophils Absolute 0.1 0.0 - 0.1 K/uL   Immature Granulocytes 0 %   Abs Immature Granulocytes 0.04 0.00 - 0.07 K/uL  Comprehensive metabolic panel     Status: Abnormal   Collection Time: 07/22/20 10:38 PM  Result Value Ref Range   Sodium 139 135 - 145 mmol/L   Potassium 3.8 3.5 - 5.1 mmol/L   Chloride 106 98 - 111 mmol/L   CO2 26 22 - 32 mmol/L   Glucose, Bld 90 70 - 99 mg/dL   BUN 21 8 - 23 mg/dL   Creatinine, Ser 0.86 0.44 - 1.00 mg/dL   Calcium 8.7 (L) 8.9 - 10.3 mg/dL   Total Protein 6.7 6.5 - 8.1 g/dL   Albumin 3.0 (L) 3.5 - 5.0 g/dL   AST 20 15 - 41 U/L   ALT 12 0 - 44 U/L   Alkaline Phosphatase 96 38 - 126 U/L   Total Bilirubin 1.0 0.3 -  1.2 mg/dL   GFR, Estimated >60 >60 mL/min   Anion gap 7 5 - 15  Protime-INR     Status: None   Collection Time: 07/22/20 10:38 PM  Result Value Ref Range   Prothrombin Time 12.2 11.4 - 15.2 seconds   INR 0.9 0.8 - 1.2  Sample to Blood Bank     Status: None   Collection Time: 07/22/20 10:38 PM  Result Value Ref Range   Blood Bank Specimen SAMPLE AVAILABLE FOR TESTING    Sample Expiration      07/25/2020,2359 Performed at [facility], [place] [address], [place], [place] [address]   POC occult blood, ED Provider will collect     Status: Abnormal   Collection Time: 07/22/20 11:12 PM  Result Value Ref Range   Fecal Occult Bld POSITIVE (A) NEGATIVE     Radiological Exams on Admission: No results found. No orders to display    Consults called:  GI   Dwyane Dee, MD [facility] 07/23/2020, 4:05 AM

## 2020-07-23 NOTE — Assessment & Plan Note (Signed)
-   no s/s exacerbation - last echo 06/24/17: EF 55-60%, Gr 1 DD, no RWMA, no AS

## 2020-07-23 NOTE — Progress Notes (Signed)
Same day note  Patient seen and examined at bedside.  Patient was admitted to the hospital for rectal bleed  At the time of my evaluation, patient complains of okay.  Has not had a bowel movement this morning.  Denies any abdominal pain nausea vomiting fever or chills.  Physical examination reveals elderly female not in obvious distress.  Mild weakness of the left upper and lower extremities.  Non tender abdomen.  Mild pallor noted.  Laboratory data and imaging was reviewed  Assessment and Plan.  GIB (gastrointestinal bleeding) FOBT positive.  Bright red blood prior to presentation.  No further episodes this morning..  No colonoscopy in the past.  GI has been consulted.  continue IV fluids.  Monitor hemoglobin transfuse if hemoglobin less than 7.  Hemodynamically stable so far.  On full liquids today. CBC Latest Ref Rng & Units 07/23/2020 07/22/2020 05/30/2020  WBC 4.0 - 10.5 K/uL 11.9(H) 11.9(H) 5.8  Hemoglobin 12.0 - 15.0 g/dL 12.3 12.0 13.0  Hematocrit 36.0 - 46.0 % 40.5 39.2 41.6  Platelets 150 - 400 K/uL 356 394 307   History of CVA (cerebrovascular accident) With residual left-sided weakness. Xarelto on hold for now.  Diastolic CHF, chronic 2D echocardiogram from 06/2017 showed ejection fraction of 55 to 60%.  Compensated at this time.  Dementia with behavioral disturbance Patient takes Seroquel, trazodone, Aricept at home.  Will resume  Essential hypertension Patient is on amlodipine, benazepril at home, will hold benazepril.  Resume amlodipine currently on hold.  Hypothyroidism.  Synthroid on the med rec.  Not taking.  Check TSH  Spoke with the patient's family at bedside  No Charge  Signed,  Delila Pereyra, MD [facility]

## 2020-07-23 NOTE — Assessment & Plan Note (Signed)
-  Daughter states patient has been living with her for approximately 3 years and they are unsure if she has ever had a colonoscopy.  She declined referral to GI by her PCP a few years ago as well -Daughter reporting bright red blood noted in her previous.  FOBT positive in ER -Hemoglobin 12 g/dL.  Continue trending hemoglobin -GI consulted on admission -Keep n.p.o. for now - continue IVF

## 2020-07-23 NOTE — Assessment & Plan Note (Signed)
-   hold home meds for now in setting of GIB

## 2020-07-23 NOTE — Assessment & Plan Note (Signed)
-   currently cooperative and pleasant - follow up med rec and resume meds as able

## 2020-07-23 NOTE — Plan of Care (Signed)
  Problem: Education: Goal: Knowledge of General Education information will improve Description: Including pain rating scale, medication(s)/side effects and non-pharmacologic comfort measures Outcome: Progressing   Problem: Health Behavior/Discharge Planning: Goal: Ability to manage health-related needs will improve Outcome: Progressing   Problem: Clinical Measurements: Goal: Will remain free from infection Outcome: Progressing   Problem: Activity: Goal: Risk for activity intolerance will decrease Outcome: Progressing   Problem: Nutrition: Goal: Adequate nutrition will be maintained Outcome: Progressing   Problem: Safety: Goal: Ability to remain free from injury will improve Outcome: Progressing

## 2020-07-23 NOTE — Hospital Course (Signed)
Ms. Heather [name] is an 87 yo female with PMH CVA (residual LUE weakness and does not walk; on Xarelto), HTN, HLD, gout, GERD, DJD who presented to the hospital after her daughter noticed that her adult briefs had bright red blood mixed with stool in them.  History is obtained mostly from the daughter who is bedside in the ER.  Patient has been living with her daughter for the past 3 years approximately.  Patient has been on Xarelto for approximately 4 years from her stroke. They deny any similar episode in the past of blood noticed in her briefs.  They are unsure of if and when the patient's last colonoscopy was. The last bloody episode noted was approximately 6 PM prior to coming to the ER.  In the ER she underwent rectal exam by the EDP and was noted to be FOBT positive with presence of hemorrhoids however no visible/obvious bleeding was explained by her hemorrhoid presence and etiology was thought to be more proximal.  Hgb on initial evaluation was 12 g/dL and she is hemodynamically stable. She last took Xarelto on Sunday evening.   She is admitted for further GIB workup and Hgb monitoring.

## 2020-07-23 NOTE — Assessment & Plan Note (Signed)
-   residual deficits include LUE weakness and daughter states patient does not walk anymore - hold Xarelto for now until GIB workup complete

## 2020-07-24 DIAGNOSIS — K922 Gastrointestinal hemorrhage, unspecified: Secondary | ICD-10-CM | POA: Diagnosis not present

## 2020-07-24 DIAGNOSIS — Z7901 Long term (current) use of anticoagulants: Secondary | ICD-10-CM | POA: Diagnosis not present

## 2020-07-24 DIAGNOSIS — I5032 Chronic diastolic (congestive) heart failure: Secondary | ICD-10-CM | POA: Diagnosis not present

## 2020-07-24 DIAGNOSIS — I10 Essential (primary) hypertension: Secondary | ICD-10-CM | POA: Diagnosis not present

## 2020-07-24 DIAGNOSIS — F0151 Vascular dementia with behavioral disturbance: Secondary | ICD-10-CM | POA: Diagnosis not present

## 2020-07-24 LAB — PHOSPHORUS: Phosphorus: 3 mg/dL (ref 2.5–4.6)

## 2020-07-24 LAB — BASIC METABOLIC PANEL
Anion gap: 7 (ref 5–15)
BUN: 16 mg/dL (ref 8–23)
CO2: 25 mmol/L (ref 22–32)
Calcium: 8.3 mg/dL — ABNORMAL LOW (ref 8.9–10.3)
Chloride: 110 mmol/L (ref 98–111)
Creatinine, Ser: 0.79 mg/dL (ref 0.44–1.00)
GFR, Estimated: >60 mL/min (ref >60)
Glucose, Bld: 99 mg/dL (ref 70–99)
Potassium: 3.6 mmol/L (ref 3.5–5.1)
Sodium: 142 mmol/L (ref 135–145)

## 2020-07-24 LAB — CBC
HCT: 35.1 % — ABNORMAL LOW (ref 36.0–46.0)
Hemoglobin: 10.8 g/dL — ABNORMAL LOW (ref 12.0–15.0)
MCH: 27 pg (ref 26.0–34.0)
MCHC: 30.8 g/dL (ref 30.0–36.0)
MCV: 87.8 fL (ref 80.0–100.0)
Platelets: 316 10*3/uL (ref 150–400)
RBC: 4 MIL/uL (ref 3.87–5.11)
RDW: 17.8 % — ABNORMAL HIGH (ref 11.5–15.5)
WBC: 10 10*3/uL (ref 4.0–10.5)
nRBC: 0 % (ref 0.0–0.2)

## 2020-07-24 LAB — TSH: TSH: 2.687 u[IU]/mL (ref 0.350–4.500)

## 2020-07-24 LAB — MAGNESIUM: Magnesium: 2.1 mg/dL (ref 1.7–2.4)

## 2020-07-24 LAB — HEMOGLOBIN AND HEMATOCRIT, BLOOD
HCT: 35 % — ABNORMAL LOW (ref 36.0–46.0)
HCT: 32.9 % — ABNORMAL LOW (ref 36.0–46.0)
Hemoglobin: 10.6 g/dL — ABNORMAL LOW (ref 12.0–15.0)
Hemoglobin: 10 g/dL — ABNORMAL LOW (ref 12.0–15.0)

## 2020-07-24 MED ORDER — PEG-KCL-NACL-NASULF-NA ASC-C 100 G PO SOLR: 1.0000 | Freq: Once | ORAL | Status: DC

## 2020-07-24 MED ORDER — PEG-KCL-NACL-NASULF-NA ASC-C 100 G PO SOLR
0.5000 | Freq: Once | ORAL | Status: AC
  Administered 2020-07-25: 100 g via ORAL

## 2020-07-24 MED ORDER — PEG-KCL-NACL-NASULF-NA ASC-C 100 G PO SOLR
0.5000 | Freq: Once | ORAL | Status: AC
  Administered 2020-07-24: 100 g via ORAL
  Filled 2020-07-24: qty 1

## 2020-07-24 NOTE — Progress Notes (Addendum)
Patient ID: Heather [name], female   DOB: 1928-07-22, 87 y.o.   MRN: [id number]    Progress Note   Subjective  Day #2 CC: rectal bleeding  Hemoglobin 11.2> 10.8 this a.m.  Xarelto on hold  Nurse reports no further active GI bleeding, smears of blood when cleaning after urination, no bowel movement  Daughter at bedside this morning, family has discussed options and would like to proceed with colonoscopy for definitive diagnosis, especially as she will need to stay on blood thinner.    Objective   Vital signs in last 24 hours: Temp:  [99 F (37.2 C)-99.1 F (37.3 C)] 99.1 F (37.3 C) (04/13 0508) Pulse Rate:  [59-66] 59 (04/13 0508) Resp:  [18] 18 (04/13 0508) BP: (149-152)/(56-65) 149/65 (04/13 0508) SpO2:  [93 %] 93 % (04/13 0508) Last BM Date: 07/23/20 General:    Elderly African-American female in NAD in good spirits as usual, daughter at bedside Heart:  Regular rate and rhythm; no murmurs Lungs: Respirations even and unlabored, lungs CTA bilaterally Abdomen:  Soft, nontender and nondistended. Normal bowel sounds. Extremities:  Without edema. Neurologic:  Alert and oriented, left-sided deficits Psych:  Cooperative. Normal mood and affect.  Intake/Output from previous day: 04/12 0701 - 04/13 0700 In: 1029.5 [I.V.:1029.5] Out: -  Intake/Output this shift: No intake/output data recorded.  Lab Results: Recent Labs    07/22/20 2238 07/23/20 0428 07/23/20 1136 07/23/20 1654 07/24/20 0635  WBC 11.9* 11.9*  --   --  10.0  HGB 12.0 12.3 12.0 11.2* 10.8*  HCT 39.2 40.5 37.7 36.4 35.1*  PLT 394 356  --   --  316   BMET Recent Labs    07/22/20 2238 07/23/20 0428 07/24/20 0501  NA 139 142 142  K 3.8 3.5 3.6  CL 106 108 110  CO2 26 25 25   GLUCOSE 90 99 99  BUN 21 21 16   CREATININE 0.86 0.82 0.79  CALCIUM 8.7* 8.9 8.3*   LFT Recent Labs    07/22/20 2238  PROT 6.7  ALBUMIN 3.0*  AST 20  ALT 12  ALKPHOS 96  BILITOT 1.0   PT/INR Recent Labs     07/22/20 2238  LABPROT 12.2  INR 0.9        Assessment / Plan:    #20 87 year old African-American female admitted with GI bleeding/bloody stool in setting of chronic Xarelto.  Prior history of GI bleeding and no prior colonoscopy.  Fortunately she has not had any significant bleeding since admission, hemoglobin has drifted from 11.9 on admission to 10.8 today.  Family has decided to proceed with colonoscopy for definitive diagnosis and  in light of need for chronic anticoagulation.  #2 mild anemia secondary to above #3 chronic anticoagulation-on Xarelto chronically #4 history of CVA with left-sided deficits, nonambulatory #5 mild dementia 6.  Hypertension  Plan:  Start clear liquids today, bowel prep this evening and in the early a.m. tomorrow Continue serial hemoglobins Continue to hold Xarelto Patient will be scheduled for colonoscopy with Dr. Fuller Plan early tomorrow afternoon.  Procedure was discussed in detail with the patient and her daughter including indications risk and benefits and they are agreeable to proceed.    LOS: 1 day   Amy Esterwood PA-C 07/24/2020, 9:02 AM      Attending Physician Note   I have taken an interval history, reviewed the chart and examined the patient. I agree with the Advanced Practitioner's note, impression and recommendations.   Patient and daughter want to proceed with  colonoscopy. I am concerned the bowel prep will be difficult or not doable given her health status and mobility limitations. Attempt bowel prep for colonoscopy tomorrow afternoon. Continue to hold Xarelto and trend CBC.   Lucio Edward, MD FACG [phone]

## 2020-07-24 NOTE — Care Management Obs Status (Signed)
[facility] NOTIFICATION   Patient Details  Name: Heather [name] MRN: [id number] Date of Birth: 18-Sep-1928   Medicare Observation Status Notification Given:  Yes    MahabirJuliann Pulse, RN 07/24/2020, 2:24 PM

## 2020-07-24 NOTE — Progress Notes (Signed)
PROGRESS NOTE  Heather [name] UYQ:[id number] DOB: [birth year] DOA: 07/22/2020 PCP: Biagio Borg, MD   LOS: 1 day   Brief narrative: Ms. Heather [name] is an 87 yo female with past medical history of CVA with residual left upper extremity weakness on Xarelto, ambulatory dysfunction, hypertension, hyperlipidemia, gout, GERD, DJD presented to hospital after bright red blood was noted by the family and her face.  Patient lives with her daughter for last 3 years or so after sustaining a stroke 4 years back.  No previous history of stroke.  In the ED patient was noted to be FOBT positive with hemorrhage.  Initial hemoglobin was 12.  She was hemodynamically stable.  Xarelto was kept on hold and GI was consulted.    Assessment/Plan:  Principal Problem:   GIB (gastrointestinal bleeding) Active Problems:   Essential hypertension   Dementia with behavioral disturbance (HCC)   Diastolic CHF, chronic (HCC)   History of CVA (cerebrovascular accident)   GIB  FOBT positive.  Bright red blood prior to presentation.  No further episodes this today..  No colonoscopy in the past.  GI on board and plan for colonoscopic evaluation tomorrow..  continue IV fluids.  Monitor hemoglobin transfuse if hemoglobin less than 7.  Hemodynamically stable so far.   On full liquids so far.  History of CVA With residual left-sided weakness. Xarelto on hold for now.  Not much ambulatory at baseline.  Lives with her daughter at home.  Diastolic CHF, chronic 2D echocardiogram from 06/2017 showed ejection fraction of 55 to 60%.  Compensated at this time.  Dementia with behavioral disturbance Patient takes Seroquel, trazodone, Aricept at home.  Has been dizzy while in the hospital  Essential hypertension Patient is on amlodipine, benazepril at home, will continue to hold benazepril.   Hypothyroidism.  Synthroid on the med rec.  Not taking.    TSH within normal limits at 2.6.  DVT prophylaxis: SCDs Start: 07/23/20 0400     Code Status: Full code  Family Communication: Spoke with the patient's family at bedside  Status is: Inpatient  Remains inpatient appropriate because:IV treatments appropriate due to intensity of illness or inability to take PO, Inpatient level of care appropriate due to severity of illness and Need for colonoscopic evaluation   Dispo: The patient is from: Home              Anticipated d/c is to: Home              Patient currently is not medically stable to d/c.   Difficult to place patient No   Consultants:  GI  Procedures:  None  Anti-infectives:  . None  Anti-infectives (From admission, onward)   None     Subjective: Today, patient was seen and examined at bedside.  Patient denies any nausea, vomiting, abdominal pain or bloody bowel movement.  Objective: Vitals:   07/24/20 0508 07/24/20 0918  BP: (!) 149/65 (!) 154/86  Pulse: (!) 59   Resp: 18   Temp: 99.1 F (37.3 C)   SpO2: 93%     Intake/Output Summary (Last 24 hours) at 07/24/2020 1029 Last data filed at 07/23/2020 1900 Gross per 24 hour  Intake 1029.5 ml  Output --  Net 1029.5 ml   Filed Weights   07/22/20 2134  Weight: 72 kg   Body mass index is 31 kg/m.   Physical Exam: GENERAL: Patient is alert awake and communicative, not in obvious distress.  Obese HENT: No scleral pallor or icterus. Pupils equally  reactive to light. Oral mucosa is moist NECK: is supple, no gross swelling noted. CHEST: Clear to auscultation. No crackles or wheezes.  Diminished breath sounds bilaterally. CVS: S1 and S2 heard, no murmur. Regular rate and rhythm.  ABDOMEN: Soft, non-tender, obese, bowel sounds are present. EXTREMITIES: Bilateral lower extremity edema noted CNS: Cranial nerves are intact. No focal motor deficits. SKIN: warm and dry without rashes.  Data Review: I have personally reviewed the following laboratory data and studies,  CBC: Recent Labs  Lab 07/22/20 2238 07/23/20 0428 07/23/20 1136  07/23/20 1654 07/24/20 0635  WBC 11.9* 11.9*  --   --  10.0  NEUTROABS 8.3* 8.6*  --   --   --   HGB 12.0 12.3 12.0 11.2* 10.8*  HCT 39.2 40.5 37.7 36.4 35.1*  MCV 87.1 88.2  --   --  87.8  PLT 394 356  --   --  814   Basic Metabolic Panel: Recent Labs  Lab 07/22/20 2238 07/23/20 0428 07/24/20 0501  NA 139 142 142  K 3.8 3.5 3.6  CL 106 108 110  CO2 26 25 25   GLUCOSE 90 99 99  BUN 21 21 16   CREATININE 0.86 0.82 0.79  CALCIUM 8.7* 8.9 8.3*  MG  --  2.2 2.1  PHOS  --   --  3.0   Liver Function Tests: Recent Labs  Lab 07/22/20 2238  AST 20  ALT 12  ALKPHOS 96  BILITOT 1.0  PROT 6.7  ALBUMIN 3.0*   No results for input(s): LIPASE, AMYLASE in the last 168 hours. No results for input(s): AMMONIA in the last 168 hours. Cardiac Enzymes: No results for input(s): CKTOTAL, CKMB, CKMBINDEX, TROPONINI in the last 168 hours. BNP (last 3 results) No results for input(s): BNP in the last 8760 hours.  ProBNP (last 3 results) No results for input(s): PROBNP in the last 8760 hours.  CBG: No results for input(s): GLUCAP in the last 168 hours. Recent Results (from the past 240 hour(s))  SARS CORONAVIRUS 2 (TAT 6-24 HRS) Nasopharyngeal Nasopharyngeal Swab     Status: None   Collection Time: 07/23/20  2:25 AM   Specimen: Nasopharyngeal Swab  Result Value Ref Range Status   SARS Coronavirus 2 NEGATIVE NEGATIVE Final    Comment: (NOTE) SARS-CoV-2 target nucleic acids are NOT DETECTED.  The SARS-CoV-2 RNA is generally detectable in upper and lower respiratory specimens during the acute phase of infection. Negative results do not preclude SARS-CoV-2 infection, do not rule out co-infections with other pathogens, and should not be used as the sole basis for treatment or other patient management decisions. Negative results must be combined with clinical observations, patient history, and epidemiological information. The expected result is Negative.  Fact Sheet for  Patients: SugarRoll.be  Fact Sheet for Healthcare Providers: https://www.woods-mathews.com/  This test is not yet approved or cleared by the [place] FDA and  has been authorized for detection and/or diagnosis of SARS-CoV-2 by FDA under an Emergency Use Authorization (EUA). This EUA will remain  in effect (meaning this test can be used) for the duration of the COVID-19 declaration under Se ction 564(b)(1) of the Act, 21 U.S.C. section 360bbb-3(b)(1), unless the authorization is terminated or revoked sooner.  Performed at [facility], [place] [address] [place], [place] [address]      Studies: No results found.   Flora Lipps, MD  [facility] 07/24/2020  If 7PM-7AM, please contact night-coverage

## 2020-07-24 NOTE — Care Management CC44 (Signed)
Condition Code 44 Documentation Completed  Patient Details  Name: Heather [name] MRN: [id number] Date of Birth: [birth year]   Condition Code 44 given:  Yes Patient signature on Condition Code 44 notice:  Yes Documentation of 2 MD's agreement:  Yes Code 44 added to claim:  Yes    Dessa Phi, RN 07/24/2020, 2:25 PM

## 2020-07-25 ENCOUNTER — Encounter (HOSPITAL_COMMUNITY): Payer: Self-pay | Admitting: Internal Medicine

## 2020-07-25 ENCOUNTER — Encounter (HOSPITAL_COMMUNITY)
Admission: EM | Disposition: A | Discharge status: Home / Self Care | Payer: Self-pay | Source: Home / Self Care | Attending: Emergency Medicine

## 2020-07-25 ENCOUNTER — Observation Stay (HOSPITAL_COMMUNITY): Payer: Medicare Other | Admitting: Certified Registered"

## 2020-07-25 DIAGNOSIS — K921 Melena: Secondary | ICD-10-CM | POA: Diagnosis not present

## 2020-07-25 DIAGNOSIS — K625 Hemorrhage of anus and rectum: Secondary | ICD-10-CM | POA: Diagnosis not present

## 2020-07-25 DIAGNOSIS — K529 Noninfective gastroenteritis and colitis, unspecified: Secondary | ICD-10-CM | POA: Diagnosis not present

## 2020-07-25 DIAGNOSIS — Z20822 Contact with and (suspected) exposure to covid-19: Secondary | ICD-10-CM | POA: Diagnosis not present

## 2020-07-25 DIAGNOSIS — D124 Benign neoplasm of descending colon: Secondary | ICD-10-CM

## 2020-07-25 DIAGNOSIS — D123 Benign neoplasm of transverse colon: Secondary | ICD-10-CM | POA: Diagnosis not present

## 2020-07-25 DIAGNOSIS — K50111 Crohn's disease of large intestine with rectal bleeding: Secondary | ICD-10-CM

## 2020-07-25 DIAGNOSIS — Z7901 Long term (current) use of anticoagulants: Secondary | ICD-10-CM | POA: Diagnosis not present

## 2020-07-25 DIAGNOSIS — K635 Polyp of colon: Secondary | ICD-10-CM | POA: Diagnosis not present

## 2020-07-25 DIAGNOSIS — I10 Essential (primary) hypertension: Secondary | ICD-10-CM | POA: Diagnosis not present

## 2020-07-25 DIAGNOSIS — F0151 Vascular dementia with behavioral disturbance: Secondary | ICD-10-CM | POA: Diagnosis not present

## 2020-07-25 DIAGNOSIS — I5032 Chronic diastolic (congestive) heart failure: Secondary | ICD-10-CM | POA: Diagnosis not present

## 2020-07-25 DIAGNOSIS — K922 Gastrointestinal hemorrhage, unspecified: Secondary | ICD-10-CM | POA: Diagnosis not present

## 2020-07-25 HISTORY — PX: COLONOSCOPY WITH PROPOFOL: SHX5780

## 2020-07-25 HISTORY — PX: POLYPECTOMY: SHX5525

## 2020-07-25 HISTORY — PX: BIOPSY: SHX5522

## 2020-07-25 LAB — BASIC METABOLIC PANEL
Anion gap: 6 (ref 5–15)
BUN: 9 mg/dL (ref 8–23)
CO2: 24 mmol/L (ref 22–32)
Calcium: 8.1 mg/dL — ABNORMAL LOW (ref 8.9–10.3)
Chloride: 108 mmol/L (ref 98–111)
Creatinine, Ser: 0.84 mg/dL (ref 0.44–1.00)
GFR, Estimated: >60 mL/min (ref >60)
Glucose, Bld: 76 mg/dL (ref 70–99)
Potassium: 3.4 mmol/L — ABNORMAL LOW (ref 3.5–5.1)
Sodium: 138 mmol/L (ref 135–145)

## 2020-07-25 LAB — CBC
HCT: 33.3 % — ABNORMAL LOW (ref 36.0–46.0)
Hemoglobin: 10.2 g/dL — ABNORMAL LOW (ref 12.0–15.0)
MCH: 27 pg (ref 26.0–34.0)
MCHC: 30.6 g/dL (ref 30.0–36.0)
MCV: 88.1 fL (ref 80.0–100.0)
Platelets: 303 10*3/uL (ref 150–400)
RBC: 3.78 MIL/uL — ABNORMAL LOW (ref 3.87–5.11)
RDW: 18 % — ABNORMAL HIGH (ref 11.5–15.5)
WBC: 8.2 10*3/uL (ref 4.0–10.5)
nRBC: 0 % (ref 0.0–0.2)

## 2020-07-25 LAB — PHOSPHORUS: Phosphorus: 3 mg/dL (ref 2.5–4.6)

## 2020-07-25 LAB — HEMOGLOBIN AND HEMATOCRIT, BLOOD
HCT: 36.8 % (ref 36.0–46.0)
Hemoglobin: 10.9 g/dL — ABNORMAL LOW (ref 12.0–15.0)

## 2020-07-25 LAB — MAGNESIUM: Magnesium: 1.9 mg/dL (ref 1.7–2.4)

## 2020-07-25 SURGERY — COLONOSCOPY WITH PROPOFOL
Anesthesia: Monitor Anesthesia Care

## 2020-07-25 MED ORDER — FLEET ENEMA 7-19 GM/118ML RE ENEM
2.0000 | ENEMA | Freq: Once | RECTAL | Status: AC
  Administered 2020-07-25: 2 via RECTAL
  Filled 2020-07-25: qty 2

## 2020-07-25 MED ORDER — LIDOCAINE 2% (20 MG/ML) 5 ML SYRINGE
INTRAMUSCULAR | Status: DC | PRN
  Administered 2020-07-25: 40 mg via INTRAVENOUS

## 2020-07-25 MED ORDER — LACTATED RINGERS IV SOLN: INTRAVENOUS | Status: DC

## 2020-07-25 MED ORDER — PROPOFOL 500 MG/50ML IV EMUL
INTRAVENOUS | Status: DC | PRN
  Administered 2020-07-25: 100 ug/kg/min via INTRAVENOUS

## 2020-07-25 MED ORDER — PROPOFOL 10 MG/ML IV BOLUS
INTRAVENOUS | Status: DC | PRN
  Administered 2020-07-25: 20 mg via INTRAVENOUS

## 2020-07-25 MED ORDER — POTASSIUM CHLORIDE CRYS ER 20 MEQ PO TBCR
40.0000 meq | EXTENDED_RELEASE_TABLET | Freq: Once | ORAL | Status: AC
  Administered 2020-07-25: 40 meq via ORAL
  Filled 2020-07-25: qty 2

## 2020-07-25 SURGICAL SUPPLY — 22 items

## 2020-07-25 NOTE — Anesthesia Postprocedure Evaluation (Signed)
Anesthesia Post Note  Patient: Heather [name]  Procedure(s) Performed: COLONOSCOPY WITH PROPOFOL (N/A ) POLYPECTOMY BIOPSY     Patient location during evaluation: Endoscopy Anesthesia Type: MAC Level of consciousness: awake Pain management: pain level controlled Vital Signs Assessment: post-procedure vital signs reviewed and stable Respiratory status: spontaneous breathing Postop Assessment: no apparent nausea or vomiting Anesthetic complications: no   No complications documented.  Last Vitals:  Vitals:   07/25/20 1559 07/25/20 1600  BP:  (!) 100/46  Pulse: 60 (!) 56  Resp: 18 17  Temp:    SpO2: 100% 100%    Last Pain:  Vitals:   07/25/20 1358  TempSrc: Oral  PainSc: 0-No pain                 Huston Foley

## 2020-07-25 NOTE — Op Note (Signed)
[facility] Patient Name: Heather [name] Procedure Date: 07/25/2020 MRN: [id number] Attending MD: Jerene Bears , MD Date of Birth: [birth year] CSN: [id number] Age: 87 Admit Type: Inpatient Procedure:                Colonoscopy Indications:              Rectal bleeding Providers:                Lajuan Lines. Hilarie Fredrickson, MD, Benay Pillow, RN, Tyna Jaksch                            Technician Referring MD:             [facility] Medicines:                Monitored Anesthesia Care Complications:            No immediate complications. Estimated Blood Loss:     Estimated blood loss was minimal. Procedure:                Pre-Anesthesia Assessment:                           - Prior to the procedure, a History and Physical                            was performed, and patient medications and                            allergies were reviewed. The patient's tolerance of                            previous anesthesia was also reviewed. The risks                            and benefits of the procedure and the sedation                            options and risks were discussed with the patient.                            All questions were answered, and informed consent                            was obtained. Prior Anticoagulants: The patient has                            taken Xarelto (rivaroxaban), last dose was 4 days                            prior to procedure. ASA Grade Assessment: III - A                            patient with severe systemic disease. After  reviewing the risks and benefits, the patient was                            deemed in satisfactory condition to undergo the                            procedure.                           After obtaining informed consent, the colonoscope                            was passed under direct vision. Throughout the                            procedure, the patient's blood pressure, pulse,  and                            oxygen saturations were monitored continuously. The                            PCF-H190DL ([id number] Olympus pediatric colonoscope                            was introduced through the anus and advanced to the                            cecum, identified by appendiceal orifice and                            ileocecal valve. The colonoscopy was performed                            without difficulty. The patient tolerated the                            procedure well. The quality of the bowel                            preparation was good. The ileocecal valve,                            appendiceal orifice, and rectum were photographed. Scope In: 3:31:28 PM Scope Out: 3:52:43 PM Scope Withdrawal Time: 0 hours 15 minutes 35 seconds  Total Procedure Duration: 0 hours 21 minutes 15 seconds  Findings:      The digital rectal exam was normal.      Two sessile polyps were found in the descending colon and transverse       colon. The polyps were 7 to 8 mm in size. These polyps were removed with       a cold snare. Resection and retrieval were complete.      Segmental moderate mucosal changes characterized by congestion (edema),       erythema and loss of vascularity were found in the recto-sigmoid colon,       in the  mid sigmoid colon and in the distal sigmoid colon. The begins in       the proximal rectum near rectosigmoid colon and continues to       approximately 20-25 cm from the dentate line. Biopsies were taken with a       cold forceps for histology (rule out ischemic injury and Crohn's).      The exam was otherwise without abnormality. Normal mucosa without       colitis in the cecum, ascending colon, transverse colon, descending       colon, proximal sigmoid colon, and distal rectum.      The retroflexed view of the distal rectum and anal verge was normal and       showed no anal or rectal abnormalities. Impression:               - Two 7 to 8 mm polyps in  the descending colon and                            in the transverse colon, removed with a cold snare.                            Resected and retrieved.                           - Segmental moderate mucosal changes were found                            from the rectosigmoid colon to the mid sigmoid                            colon secondary to colitis. Biopsied to help                            determine etiology (query ischemic injury versus                            inflammatory).                           - The examination was otherwise normal as above.                           - The distal rectum and anal verge are normal on                            retroflexion view. Moderate Sedation:      N/A Recommendation:           - Return patient to hospital ward for ongoing care.                           - Advance diet as tolerated.                           - Continue present medications. Would hold Xarelto  for the time being.                           - Await pathology results. Further recommendations                            once pathology reviewed.                           - No recommendation at this time regarding repeat                            colonoscopy. Procedure Code(s):        --- Professional ---                           [phone], Colonoscopy, flexible; with removal of                            tumor(s), polyp(s), or other lesion(s) by snare                            technique                           45380, 75, Colonoscopy, flexible; with biopsy,                            single or multiple Diagnosis Code(s):        --- Professional ---                           K63.5, Polyp of colon                           K52.9, Noninfective gastroenteritis and colitis,                            unspecified                           K62.5, Hemorrhage of anus and rectum CPT copyright 2019 American Medical Association. All rights reserved. The  codes documented in this report are preliminary and upon coder review may  be revised to meet current compliance requirements. Jerene Bears, MD 07/25/2020 4:13:11 PM This report has been signed electronically. Number of Addenda: 0

## 2020-07-25 NOTE — Procedures (Addendum)
Spoke to the patient's daughter Heather [name] after her colonoscopy I informed her as to the results  We will need to await biopsies to determine the etiology of the segmental colitis We will plan to leave patient off Xarelto for the time being We will need biopsy results in order to make plan going forward She has arranged for her mother to have a medical Lucianne Lei transport home tomorrow at 4 PM if medically appropriate.  This seems reasonable if hemoglobin is stable  Time provided for questions and answers and she thanked me for the call  We will advance diet to soft

## 2020-07-25 NOTE — Plan of Care (Signed)
  Problem: Education: Goal: Knowledge of General Education information will improve Description: Including pain rating scale, medication(s)/side effects and non-pharmacologic comfort measures Outcome: Progressing   Problem: Health Behavior/Discharge Planning: Goal: Ability to manage health-related needs will improve Outcome: Progressing   Problem: Clinical Measurements: Goal: Will remain free from infection Outcome: Progressing   Problem: Activity: Goal: Risk for activity intolerance will decrease Outcome: Progressing   Problem: Nutrition: Goal: Adequate nutrition will be maintained Outcome: Progressing   Problem: Safety: Goal: Ability to remain free from injury will improve Outcome: Progressing   Problem: Skin Integrity: Goal: Risk for impaired skin integrity will decrease Outcome: Progressing   

## 2020-07-25 NOTE — Transfer of Care (Signed)
Immediate Anesthesia Transfer of Care Note  Patient: Heather [name]  Procedure(s) Performed: COLONOSCOPY WITH PROPOFOL (N/A ) POLYPECTOMY BIOPSY  Patient Location: Endoscopy Unit  Anesthesia Type:MAC  Level of Consciousness: sedated, patient cooperative and responds to stimulation  Airway & Oxygen Therapy: Patient Spontanous Breathing and Patient connected to face mask oxygen  Post-op Assessment: Report given to RN, Post -op Vital signs reviewed and stable and Patient moving all extremities  Post vital signs: Reviewed and stable  Last Vitals:  Vitals Value Taken Time  BP    Temp    Pulse 60 07/25/20 1558  Resp 18 07/25/20 1558  SpO2 100 % 07/25/20 1558  Vitals shown include unvalidated device data.  Last Pain:  Vitals:   07/25/20 1358  TempSrc: Oral  PainSc: 0-No pain         Complications: No complications documented.

## 2020-07-25 NOTE — Anesthesia Preprocedure Evaluation (Signed)
Anesthesia Evaluation  Patient identified by MRN, date of birth, ID band Patient awake    Reviewed: Allergy & Precautions, NPO status , Patient's Chart, lab work & pertinent test results  Airway Mallampati: II       Dental no notable dental hx.    Pulmonary    Pulmonary exam normal        Cardiovascular hypertension, Pt. on medications Normal cardiovascular exam     Neuro/Psych PSYCHIATRIC DISORDERS Dementia    GI/Hepatic GERD  Medicated,  Endo/Other    Renal/GU      Musculoskeletal   Abdominal (+) + obese,   Peds  Hematology   Anesthesia Other Findings   Reproductive/Obstetrics                             Anesthesia Physical Anesthesia Plan  ASA: III  Anesthesia Plan: MAC   Post-op Pain Management:    Induction:   PONV Risk Score and Plan: Propofol infusion  Airway Management Planned: Natural Airway, Nasal Cannula and Simple Face Mask  Additional Equipment: None  Intra-op Plan:   Post-operative Plan:   Informed Consent: I have reviewed the patients History and Physical, chart, labs and discussed the procedure including the risks, benefits and alternatives for the proposed anesthesia with the patient or authorized representative who has indicated his/her understanding and acceptance.       Plan Discussed with: CRNA  Anesthesia Plan Comments:         Anesthesia Quick Evaluation

## 2020-07-25 NOTE — Progress Notes (Signed)
Pt incontinent of large liquid BM color light yellowish brown.

## 2020-07-25 NOTE — Progress Notes (Signed)
PROGRESS NOTE  Heather [name] DJM:[id number] DOB: Oct 06, 1928 DOA: 07/22/2020 PCP: Biagio Borg, MD   LOS: 1 day   Brief narrative: Heather [name] is an 87 yo female with past medical history of CVA with residual left upper extremity weakness on Xarelto, ambulatory dysfunction, hypertension, hyperlipidemia, gout, GERD, DJD presented to hospital after bright red blood was noted by the family and her face.  Patient lives with her daughter for last 3 years or so after sustaining a stroke 4 years back.  No previous history of stroke.  In the ED patient was noted to be FOBT positive with hemorrhage.  Initial hemoglobin was 12.  She was hemodynamically stable.  Xarelto was kept on hold and GI was consulted.   Assessment/Plan:  Principal Problem:   GIB (gastrointestinal bleeding) Active Problems:   Essential hypertension   Dementia with behavioral disturbance (HCC)   Diastolic CHF, chronic (HCC)   History of CVA (cerebrovascular accident)   Lower GI bleed   Long term current use of anticoagulant therapy   GI bleed. FOBT positive.  Bright red blood prior to presentation.  No further episodes this after hospitalization.  Awaiting for colonoscopic evaluation by GI.  Monitor hemoglobin transfuse if hemoglobin less than 7.  Hemodynamically stable so far.     History of CVA With residual left-sided weakness. Xarelto on hold for now.  Not much ambulatory at baseline.  Lives with her daughter at home.  Hypokalemia.  Will replace.  Check levels in a.m.  Diastolic CHF, chronic 2D echocardiogram from 06/2017 showed ejection fraction of 55 to 60%.  Compensated at this time.  Dementia with behavioral disturbance Patient takes Seroquel, trazodone, Aricept at home.   Essential hypertension Patient is on amlodipine, benazepril at home,  continue to hold benazepril.   Hypothyroidism.  Synthroid on the med rec.  Not taking.    TSH within normal limits at 2.6.  DVT prophylaxis: SCDs Start: 07/23/20  0400    Code Status: Full code  Family Communication:  Spoke with the patient's family at bedside  Status is: Inpatient  Remains inpatient appropriate because:IV treatments appropriate due to intensity of illness or inability to take PO, Inpatient level of care appropriate due to severity of illness and Need for colonoscopic evaluation   Dispo: The patient is from: Home              Anticipated d/c is to: Home likely by tomorrow.  Awaiting for colonoscopy.              Patient currently is not medically stable to d/c.   Difficult to place patient No   Consultants:  GI  Procedures:  For colonoscopy 07/25/2020  Anti-infectives:  . None  Anti-infectives (From admission, onward)   None     Subjective: Today, patient was seen and examined at bedside.  Is doing preparation for colonoscopy.  Denies any pain, nausea vomiting or blood in stool.  Objective: Vitals:   07/25/20 1620 07/25/20 1627  BP: (!) 133/48 (!) 138/51  Pulse: (!) 53 (!) 53  Resp: (!) 21 15  Temp:    SpO2: 99% 100%    Intake/Output Summary (Last 24 hours) at 07/25/2020 1627 Last data filed at 07/25/2020 1559 Gross per 24 hour  Intake 750 ml  Output 50 ml  Net 700 ml   Filed Weights   07/22/20 2134 07/25/20 1358  Weight: 72 kg 72 kg   Body mass index is 31 kg/m.   Physical Exam: GENERAL: Patient is  alert awake and communicative, not in obvious distress.  Obese HENT: No scleral pallor or icterus. Pupils equally reactive to light. Oral mucosa is moist NECK: is supple, no gross swelling noted. CHEST: Clear to auscultation. No crackles or wheezes.  Diminished breath sounds bilaterally. CVS: S1 and S2 heard, no murmur. Regular rate and rhythm.  ABDOMEN: Soft, non-tender, obese, bowel sounds are present. EXTREMITIES: Bilateral lower extremity edema noted CNS: Cranial nerves are intact. No focal motor deficits. SKIN: warm and dry without rashes.  Data Review: I have personally reviewed the  following laboratory data and studies,  CBC: Recent Labs  Lab 07/22/20 2238 07/23/20 0428 07/23/20 1136 07/24/20 0635 07/24/20 1100 07/24/20 2303 07/25/20 0439 07/25/20 1040  WBC 11.9* 11.9*  --  10.0  --   --  8.2  --   NEUTROABS 8.3* 8.6*  --   --   --   --   --   --   HGB 12.0 12.3   < > 10.8* 10.6* 10.0* 10.2* 10.9*  HCT 39.2 40.5   < > 35.1* 35.0* 32.9* 33.3* 36.8  MCV 87.1 88.2  --  87.8  --   --  88.1  --   PLT 394 356  --  316  --   --  303  --    < > = values in this interval not displayed.   Basic Metabolic Panel: Recent Labs  Lab 07/22/20 2238 07/23/20 0428 07/24/20 0501 07/25/20 0439  NA 139 142 142 138  K 3.8 3.5 3.6 3.4*  CL 106 108 110 108  CO2 26 25 25 24   GLUCOSE 90 99 99 76  BUN 21 21 16 9   CREATININE 0.86 0.82 0.79 0.84  CALCIUM 8.7* 8.9 8.3* 8.1*  MG  --  2.2 2.1 1.9  PHOS  --   --  3.0 3.0   Liver Function Tests: Recent Labs  Lab 07/22/20 2238  AST 20  ALT 12  ALKPHOS 96  BILITOT 1.0  PROT 6.7  ALBUMIN 3.0*   No results for input(s): LIPASE, AMYLASE in the last 168 hours. No results for input(s): AMMONIA in the last 168 hours. Cardiac Enzymes: No results for input(s): CKTOTAL, CKMB, CKMBINDEX, TROPONINI in the last 168 hours. BNP (last 3 results) No results for input(s): BNP in the last 8760 hours.  ProBNP (last 3 results) No results for input(s): PROBNP in the last 8760 hours.  CBG: No results for input(s): GLUCAP in the last 168 hours. Recent Results (from the past 240 hour(s))  SARS CORONAVIRUS 2 (TAT 6-24 HRS) Nasopharyngeal Nasopharyngeal Swab     Status: None   Collection Time: 07/23/20  2:25 AM   Specimen: Nasopharyngeal Swab  Result Value Ref Range Status   SARS Coronavirus 2 NEGATIVE NEGATIVE Final    Comment: (NOTE) SARS-CoV-2 target nucleic acids are NOT DETECTED.  The SARS-CoV-2 RNA is generally detectable in upper and lower respiratory specimens during the acute phase of infection. Negative results do not  preclude SARS-CoV-2 infection, do not rule out co-infections with other pathogens, and should not be used as the sole basis for treatment or other patient management decisions. Negative results must be combined with clinical observations, patient history, and epidemiological information. The expected result is Negative.  Fact Sheet for Patients: SugarRoll.be  Fact Sheet for Healthcare Providers: https://www.woods-mathews.com/  This test is not yet approved or cleared by the [place] FDA and  has been authorized for detection and/or diagnosis of SARS-CoV-2 by FDA under an Emergency  Use Authorization (EUA). This EUA will remain  in effect (meaning this test can be used) for the duration of the COVID-19 declaration under Se ction 564(b)(1) of the Act, 21 U.S.C. section 360bbb-3(b)(1), unless the authorization is terminated or revoked sooner.  Performed at [facility], [place] [address], [place], [place] [address]      Studies: No results found.   Flora Lipps, MD  [facility] 07/25/2020  If 7PM-7AM, please contact night-coverage

## 2020-07-25 NOTE — Progress Notes (Addendum)
Patient ID: Heather [name], female   DOB: 16-Aug-1928, 87 y.o.   MRN: [id number]    Progress Note   Subjective  Day # 3 CC: rectal bleeding  HGB 10.2 this am down from 10.8   Nurses patient drank 1 L of the prep last evening did have bowel movements but not clear, she still working on the second liter, no bowel movements this morning so far Patient completed the second liter of prep by 9:30 AM, no further bowel movements thus far.  Patient's daughter says that the last stool was sandy appearing about 4 AM   Objective   Vital signs in last 24 hours: Temp:  [98 F (36.7 C)-99.1 F (37.3 C)] 98 F (36.7 C) (04/14 0600) Pulse Rate:  [62] 62 (04/13 2100) Resp:  [16-21] 21 (04/14 0600) BP: (125-160)/(54-86) 125/54 (04/14 0600) SpO2:  [94 %-100 %] 100 % (04/14 0600) Last BM Date: 07/25/20 General:    Elderly African-American female in NAD Heart:  Regular rate and rhythm; no murmurs Lungs: Respirations even and unlabored, lungs CTA bilaterally Abdomen:  Soft, nontender and nondistended. Normal bowel sounds. Extremities:  Without edema. Neurologic:  Alert and oriented, left-sided deficits Psych:  Cooperative. Normal mood and affect.  Intake/Output from previous day: 04/13 0701 - 04/14 0700 In: 750 [P.O.:750] Out: 600 [Urine:600] Intake/Output this shift: No intake/output data recorded.  Lab Results: Recent Labs    07/23/20 0428 07/23/20 1136 07/24/20 0635 07/24/20 1100 07/24/20 2303 07/25/20 0439  WBC 11.9*  --  10.0  --   --  8.2  HGB 12.3   < > 10.8* 10.6* 10.0* 10.2*  HCT 40.5   < > 35.1* 35.0* 32.9* 33.3*  PLT 356  --  316  --   --  303   < > = values in this interval not displayed.   BMET Recent Labs    07/23/20 0428 07/24/20 0501 07/25/20 0439  NA 142 142 138  K 3.5 3.6 3.4*  CL 108 110 108  CO2 25 25 24   GLUCOSE 99 99 76  BUN 21 16 9   CREATININE 0.82 0.79 0.84  CALCIUM 8.9 8.3* 8.1*   LFT Recent Labs    07/22/20 2238  PROT 6.7  ALBUMIN 3.0*  AST  20  ALT 12  ALKPHOS 96  BILITOT 1.0   PT/INR Recent Labs    07/22/20 2238  LABPROT 12.2  INR 0.9      Assessment / Plan:    74. 87 year old African-American female admitted with rectal bleeding, grossly bloody bowel movement, in the setting of Xarelto.  She has not had further obvious active bleeding since admission however hemoglobin has drifted from 11.9-10.6.  Patient is scheduled for colonoscopy today, she has been slow to complete the prep but has completed the prep at this point, stools not clear.  2.  History of CVA with left-sided deficits, nonambulatory 3.  Mild dementia 4.  Chronic anticoagulation-on Xarelto which has been on hold since admit  Plan:  Hopefully remainder of stool will clear this a.m.  Explained to family that she will not be ready for colonoscopy early this afternoon, as initially planned.  We are working on obtaining a late afternoon procedure slot.  I have asked the nurse to call me if stools are not clear in the next hour and will then give 2 enemas.  If unable to obtain a procedure spot later this afternoon she will be bumped to a.m.  This was explained to patient and her  daughter this morning.    LOS: 1 day   Amy EsterwoodPA-C  07/25/2020, 8:23 AM      Attending Physician Note   I have taken an interval history, reviewed the chart and examined the patient. I agree with the Advanced Practitioner's note, impression and recommendations. Patients daughter in the room. Bowel prep is in progress and effluent is clearing but not yet clear. 2 enemas given. No bleeding noted with bowel prep. Xarelto remains on hold. Colonoscopy is planned later this afternoon with Dr. Hilarie Fredrickson.   Lucio Edward, MD FACG [phone]

## 2020-07-26 ENCOUNTER — Encounter (HOSPITAL_COMMUNITY): Payer: Self-pay | Admitting: Internal Medicine

## 2020-07-26 DIAGNOSIS — E876 Hypokalemia: Secondary | ICD-10-CM

## 2020-07-26 DIAGNOSIS — F0151 Vascular dementia with behavioral disturbance: Secondary | ICD-10-CM | POA: Diagnosis not present

## 2020-07-26 DIAGNOSIS — K625 Hemorrhage of anus and rectum: Secondary | ICD-10-CM

## 2020-07-26 DIAGNOSIS — I5032 Chronic diastolic (congestive) heart failure: Secondary | ICD-10-CM | POA: Diagnosis not present

## 2020-07-26 DIAGNOSIS — I10 Essential (primary) hypertension: Secondary | ICD-10-CM | POA: Diagnosis not present

## 2020-07-26 DIAGNOSIS — K922 Gastrointestinal hemorrhage, unspecified: Secondary | ICD-10-CM | POA: Diagnosis not present

## 2020-07-26 DIAGNOSIS — E039 Hypothyroidism, unspecified: Secondary | ICD-10-CM

## 2020-07-26 LAB — BASIC METABOLIC PANEL
Anion gap: 5 (ref 5–15)
BUN: 7 mg/dL — ABNORMAL LOW (ref 8–23)
CO2: 26 mmol/L (ref 22–32)
Calcium: 8.4 mg/dL — ABNORMAL LOW (ref 8.9–10.3)
Chloride: 109 mmol/L (ref 98–111)
Creatinine, Ser: 0.68 mg/dL (ref 0.44–1.00)
GFR, Estimated: >60 mL/min (ref >60)
Glucose, Bld: 98 mg/dL (ref 70–99)
Potassium: 3.9 mmol/L (ref 3.5–5.1)
Sodium: 140 mmol/L (ref 135–145)

## 2020-07-26 LAB — CBC
HCT: 37.3 % (ref 36.0–46.0)
Hemoglobin: 11.2 g/dL — ABNORMAL LOW (ref 12.0–15.0)
MCH: 26.9 pg (ref 26.0–34.0)
MCHC: 30 g/dL (ref 30.0–36.0)
MCV: 89.7 fL (ref 80.0–100.0)
Platelets: 320 10*3/uL (ref 150–400)
RBC: 4.16 MIL/uL (ref 3.87–5.11)
RDW: 17.9 % — ABNORMAL HIGH (ref 11.5–15.5)
WBC: 7.1 10*3/uL (ref 4.0–10.5)
nRBC: 0 % (ref 0.0–0.2)

## 2020-07-26 LAB — MAGNESIUM: Magnesium: 2 mg/dL (ref 1.7–2.4)

## 2020-07-26 MED ORDER — PANTOPRAZOLE SODIUM 40 MG PO TBEC: 1.0000 | DELAYED_RELEASE_TABLET | Freq: Two times a day (BID) | ORAL | 0 refills | Status: AC

## 2020-07-26 NOTE — Discharge Summary (Signed)
Physician Discharge Summary  Heather [name] IOM:[id number] DOB: 02-Feb-1929 DOA: 07/22/2020  PCP: Biagio Borg, MD  Admit date: 07/22/2020 Discharge date: 07/26/2020  Admitted From: Inpatient Disposition: home  Recommendations for Outpatient Follow-up:  1. Follow up with PCP in 1-2 weeks 2. F/up with gi as discussed  Home Health:No Equipment/Devices:no new equip  Discharge Condition:Stable CODE STATUS:Full code Diet recommendation: heart healthy soft  Brief/Interim Summary: Ms. Heather [name] is an 87 yo female with past medical history of CVA with residual left upper extremity weakness on Xarelto, ambulatory dysfunction, hypertension, hyperlipidemia, gout, GERD, DJD presented to hospital after bright red blood was noted by the family and her face.  Patient lives with her daughter for last 3 years or so after sustaining a stroke 4 years back.  No previous history of stroke.  In the ED patient was noted to be FOBT positive with hemorrhage.  Initial hemoglobin was 12.  She was hemodynamically stable.  Xarelto was kept on hold and GI was consulted  Hospital course:  GI bleed. FOBT positive. Bright red bloodprior to presentation. No further episodes of this after hospitalization.  S/P colonoscopic evaluation by GI WITHOUT acure bleeding.GI recs keeping off blood thinner, discussed this with family who are in agreement withplan.  They will f/up with bx results Hgb has remained stable and actually increased .Marland Kitchen    History of CVA With residual left-sided weakness.Xarelto on hold for now.  Not much ambulatory at baseline.  Lives with her daughter at home.  Hypokalemia.  Was repleted.  Checked levels in a.m.and wasd 3.9  Diastolic CHF, chronic 2D echocardiogram from 06/2017 showed ejection fraction of55 to 60%. Compensatedat this time.  Dementia with behavioral disturbance Patient takes Seroquel,trazodone, Aricept at home. PCP to continue to manageas an outpt  Essential  hypertension Patient is on amlodipine,benazepril at home, continue to hold benazepril.   Hypothyroidism.Synthroid on the med rec. Not taking.  TSH within normal limits at 2.6.No obvious signs of hormonal dysregualtion.  Will defer to her primary care physician for continued outpatient surveillance and management  Discharge Diagnoses:  Principal Problem:   GIB (gastrointestinal bleeding) Active Problems:   Essential hypertension   Dementia with behavioral disturbance (HCC)   Diastolic CHF, chronic (HCC)   History of CVA (cerebrovascular accident)   Lower GI bleed   Long term current use of anticoagulant therapy   Rectal bleeding   Benign neoplasm of transverse colon   Benign neoplasm of descending colon   Segmental colitis with rectal bleeding [facility])    Discharge Instructions  Discharge Instructions    Call MD for:  difficulty breathing, headache or visual disturbances   Complete by: As directed    Call MD for:  persistant nausea and vomiting   Complete by: As directed    Call MD for:  severe uncontrolled pain   Complete by: As directed    Call MD for:  temperature >100.4   Complete by: As directed    Diet - low sodium heart healthy   Complete by: As directed    Discharge instructions   Complete by: As directed    Return to er for any changeto include bleeding   Increase activity slowly   Complete by: As directed      Allergies as of 07/26/2020      Reactions   Lipitor [atorvastatin] Other (See Comments)   myalgia   Codeine Other (See Comments)   Lovastatin Other (See Comments)   Pravastatin Sodium Other (See Comments)  Medication List    STOP taking these medications   apixaban 5 MG Tabs tablet Commonly known as: ELIQUIS   levothyroxine 25 MCG tablet Commonly known as: SYNTHROID   traMADol 50 MG tablet Commonly known as: ULTRAM   Xarelto 10 MG Tabs tablet Generic drug: rivaroxaban     TAKE these medications   amLODipine 5 MG  tablet Commonly known as: NORVASC TAKE 1 & 1/2 (ONE & ONE-HALF) TABLETS BY MOUTH DAILY   benazepril 40 MG tablet Commonly known as: LOTENSIN TAKE ONE TABLET BY MOUTH ONCE DAILY   cetirizine 10 MG tablet Commonly known as: ZYRTEC Take 1 tablet by mouth once daily   donepezil 10 MG tablet Commonly known as: ARICEPT TAKE 1 TABLET BY MOUTH ONCE DAILY   guaiFENesin 600 MG 12 hr tablet Commonly known as: Mucinex Take 2 tablets (1,200 mg total) by mouth 2 (two) times daily as needed.   montelukast 10 MG tablet Commonly known as: SINGULAIR Take 1 tablet (10 mg total) by mouth daily.   nystatin powder Commonly known as: nystatin USE AS DIRECTED TWICE DAILY AS NEEDED   pantoprazole 40 MG tablet Commonly known as: PROTONIX Take 1 tablet (40 mg total) by mouth 2 (two) times daily. What changed: when to take this   QUEtiapine 25 MG tablet Commonly known as: SEROQUEL Take 1 tablet (25 mg total) by mouth at bedtime.   traZODone 50 MG tablet Commonly known as: DESYREL TAKE 1/2 TO 1 (ONE-HALF TO ONE) TABLET BY MOUTH AT BEDTIME AS NEEDED FOR SLEEP   triamcinolone 55 MCG/ACT Aero nasal inhaler Commonly known as: NASACORT Place 2 sprays into the nose daily.       Allergies  Allergen Reactions  . Lipitor [Atorvastatin] Other (See Comments)    myalgia  . Codeine Other (See Comments)  . Lovastatin Other (See Comments)  . Pravastatin Sodium Other (See Comments)    Consultations:  GI   Procedures/Studies:  No results found.    Subjective: Patient reports she feels well Daughter at bedside, discussed plan of care which she is in agreement with  Discharge Exam: Vitals:   07/26/20 0643 07/26/20 0743  BP: (!) 186/75 (!) 149/53  Pulse: (!) 51 (!) 52  Resp: 16   Temp: 97.9 F (36.6 C) 97.6 F (36.4 C)  SpO2: 100% 98%   Vitals:   07/25/20 1649 07/25/20 2049 07/26/20 0643 07/26/20 0743  BP: (!) 136/48 (!) 154/66 (!) 186/75 (!) 149/53  Pulse: (!) 55 77 (!) 51 (!)  52  Resp:  18 16   Temp:  98.3 F (36.8 C) 97.9 F (36.6 C) 97.6 F (36.4 C)  TempSrc:  Oral    SpO2: 94% 95% 100% 98%  Weight:      Height:        General: Pt is alert, awake, not in acute distress Cardiovascular: RRR, S1/S2 +, no rubs, no gallops Respiratory: CTA bilaterally, no wheezing, no rhonchi Abdominal: Soft, NT, ND, bowel sounds + Extremities: no edema, no cyanosis    The results of significant diagnostics from this hospitalization (including imaging, microbiology, ancillary and laboratory) are listed below for reference.     Microbiology: Recent Results (from the past 240 hour(s))  SARS CORONAVIRUS 2 (TAT 6-24 HRS) Nasopharyngeal Nasopharyngeal Swab     Status: None   Collection Time: 07/23/20  2:25 AM   Specimen: Nasopharyngeal Swab  Result Value Ref Range Status   SARS Coronavirus 2 NEGATIVE NEGATIVE Final    Comment: (NOTE) SARS-CoV-2 target nucleic  acids are NOT DETECTED.  The SARS-CoV-2 RNA is generally detectable in upper and lower respiratory specimens during the acute phase of infection. Negative results do not preclude SARS-CoV-2 infection, do not rule out co-infections with other pathogens, and should not be used as the sole basis for treatment or other patient management decisions. Negative results must be combined with clinical observations, patient history, and epidemiological information. The expected result is Negative.  Fact Sheet for Patients: SugarRoll.be  Fact Sheet for Healthcare Providers: https://www.woods-mathews.com/  This test is not yet approved or cleared by the [place] FDA and  has been authorized for detection and/or diagnosis of SARS-CoV-2 by FDA under an Emergency Use Authorization (EUA). This EUA will remain  in effect (meaning this test can be used) for the duration of the COVID-19 declaration under Se ction 564(b)(1) of the Act, 21 U.S.C. section 360bbb-3(b)(1), unless the  authorization is terminated or revoked sooner.  Performed at [facility], [place] [address], [place], [place] [address]      Labs: BNP (last 3 results) No results for input(s): BNP in the last 8760 hours. Basic Metabolic Panel: Recent Labs  Lab 07/22/20 2238 07/23/20 0428 07/24/20 0501 07/25/20 0439 07/26/20 0430  NA 139 142 142 138 140  K 3.8 3.5 3.6 3.4* 3.9  CL 106 108 110 108 109  CO2 26 25 25 24 26   GLUCOSE 90 99 99 76 98  BUN 21 21 16 9  7*  CREATININE 0.86 0.82 0.79 0.84 0.68  CALCIUM 8.7* 8.9 8.3* 8.1* 8.4*  MG  --  2.2 2.1 1.9 2.0  PHOS  --   --  3.0 3.0  --    Liver Function Tests: Recent Labs  Lab 07/22/20 2238  AST 20  ALT 12  ALKPHOS 96  BILITOT 1.0  PROT 6.7  ALBUMIN 3.0*   No results for input(s): LIPASE, AMYLASE in the last 168 hours. No results for input(s): AMMONIA in the last 168 hours. CBC: Recent Labs  Lab 07/22/20 2238 07/23/20 0428 07/23/20 1136 07/24/20 0635 07/24/20 1100 07/24/20 2303 07/25/20 0439 07/25/20 1040 07/26/20 0430  WBC 11.9* 11.9*  --  10.0  --   --  8.2  --  7.1  NEUTROABS 8.3* 8.6*  --   --   --   --   --   --   --   HGB 12.0 12.3   < > 10.8* 10.6* 10.0* 10.2* 10.9* 11.2*  HCT 39.2 40.5   < > 35.1* 35.0* 32.9* 33.3* 36.8 37.3  MCV 87.1 88.2  --  87.8  --   --  88.1  --  89.7  PLT 394 356  --  316  --   --  303  --  320   < > = values in this interval not displayed.   Cardiac Enzymes: No results for input(s): CKTOTAL, CKMB, CKMBINDEX, TROPONINI in the last 168 hours. BNP: Invalid input(s): POCBNP CBG: No results for input(s): GLUCAP in the last 168 hours. D-Dimer No results for input(s): DDIMER in the last 72 hours. Hgb A1c No results for input(s): HGBA1C in the last 72 hours. Lipid Profile No results for input(s): CHOL, HDL, LDLCALC, TRIG, CHOLHDL, LDLDIRECT in the last 72 hours. Thyroid function studies Recent Labs    07/24/20 0501  TSH 2.687   Anemia work up No results for input(s):  VITAMINB12, FOLATE, FERRITIN, TIBC, IRON, RETICCTPCT in the last 72 hours. Urinalysis    Component Value Date/Time   COLORURINE YELLOW  06/23/2017 2354   APPEARANCEUR CLEAR 06/23/2017 2354   LABSPEC 1.017 06/23/2017 2354   PHURINE 5.0 06/23/2017 2354   GLUCOSEU NEGATIVE 06/23/2017 2354   GLUCOSEU NEGATIVE 03/17/2016 1727   HGBUR NEGATIVE 06/23/2017 2354   BILIRUBINUR NEGATIVE 06/23/2017 2354   KETONESUR NEGATIVE 06/23/2017 2354   PROTEINUR NEGATIVE 06/23/2017 2354   UROBILINOGEN 0.2 03/17/2016 1727   NITRITE NEGATIVE 06/23/2017 2354   LEUKOCYTESUR NEGATIVE 06/23/2017 2354   Sepsis Labs Invalid input(s): PROCALCITONIN,  WBC,  LACTICIDVEN Microbiology Recent Results (from the past 240 hour(s))  SARS CORONAVIRUS 2 (TAT 6-24 HRS) Nasopharyngeal Nasopharyngeal Swab     Status: None   Collection Time: 07/23/20  2:25 AM   Specimen: Nasopharyngeal Swab  Result Value Ref Range Status   SARS Coronavirus 2 NEGATIVE NEGATIVE Final    Comment: (NOTE) SARS-CoV-2 target nucleic acids are NOT DETECTED.  The SARS-CoV-2 RNA is generally detectable in upper and lower respiratory specimens during the acute phase of infection. Negative results do not preclude SARS-CoV-2 infection, do not rule out co-infections with other pathogens, and should not be used as the sole basis for treatment or other patient management decisions. Negative results must be combined with clinical observations, patient history, and epidemiological information. The expected result is Negative.  Fact Sheet for Patients: SugarRoll.be  Fact Sheet for Healthcare Providers: https://www.woods-mathews.com/  This test is not yet approved or cleared by the [place] FDA and  has been authorized for detection and/or diagnosis of SARS-CoV-2 by FDA under an Emergency Use Authorization (EUA). This EUA will remain  in effect (meaning this test can be used) for the duration of  the COVID-19 declaration under Se ction 564(b)(1) of the Act, 21 U.S.C. section 360bbb-3(b)(1), unless the authorization is terminated or revoked sooner.  Performed at [facility], [place] [address], [place], [place] [address]      Time coordinating discharge: Over 30 minutes  SIGNED:   Nicolette Bang, MD  [facility] 07/26/2020, 10:59 AM Pager   If 7PM-7AM, please contact night-coverage [email or web address] Password [email or web address]

## 2020-07-26 NOTE — Progress Notes (Signed)
Pt discharged home today per Dr. Wyonia Hough. Pt's IV site D/C'd and WDL. Pt's VSS. Pt and family provided with home medication list, discharge instructions and prescriptions. Verbalized understanding. Pt left floor via WC in stable condition accompanied by NT.

## 2020-07-26 NOTE — TOC Transition Note (Signed)
Transition of Care [facility]) - CM/SW Discharge Note   Patient Details  Name: Heather [name] MRN: [id number] Date of Birth: [birth year]  Transition of [facility]) CM/SW Contact:  Leeroy Cha, RN Phone Number: 07/26/2020, 11:03 AM   Clinical Narrative:    Discharge to home with self care no toc needs.   Final next level of care: Home/Self Care Barriers to Discharge: No Barriers Identified   Patient Goals and CMS Choice        Discharge Placement                       Discharge Plan and Services   Discharge Planning Services: CM Consult                                 Social Determinants of Health (SDOH) Interventions     Readmission Risk Interventions No flowsheet data found.

## 2020-07-29 LAB — SURGICAL PATHOLOGY

## 2020-07-30 ENCOUNTER — Telehealth: Payer: Self-pay | Admitting: Internal Medicine

## 2020-07-30 NOTE — Telephone Encounter (Signed)
Patients family calling, requesting a virtual visit for a follow up from the hospital for colitis and for a referral for GI, is this okay for virtual? Patient is bed ridden

## 2020-07-30 NOTE — Telephone Encounter (Signed)
Ok for virtual 

## 2020-07-31 NOTE — Telephone Encounter (Signed)
Left voicemail for [name] to call an schedule an office visit

## 2020-08-02 ENCOUNTER — Telehealth (INDEPENDENT_AMBULATORY_CARE_PROVIDER_SITE_OTHER): Payer: Medicare Other | Admitting: Internal Medicine

## 2020-08-02 DIAGNOSIS — K50111 Crohn's disease of large intestine with rectal bleeding: Secondary | ICD-10-CM

## 2020-08-02 NOTE — Progress Notes (Addendum)
Patient ID: Heather [name], female   DOB: [birth year], 87 y.o.   MRN: [id number]  Cumulative time during 7-day interval 12 min telephone visit; there was not an associated office visit for this concern within a 7 day period.  Verbal consent for services obtained from patient prior to services given.  Names of all persons present for services: [name], MD, patient  Chief complaint: f/u hospn for GI bleeding  History, background, results pertinent:  Here after recent hospn 4/11 - 89 with hematochezia as BRBPR small volume noted per family without abd pain and on xarelto x 4 yrs after prior cva; In the ED patient was noted to be FOBT positive with hemorrhage. Initial hemoglobin was 12. She was hemodynamically stable. Xarelto was kept on hold and GI was consulted.  S/P colonoscopic evaluation by GI WITHOUT acure bleeding.GI recs keeping off blood thinner, discussed this with family who are in agreement withplan.  They will f/up with bx results Hgb has remained stable and actually increased  Post hospital pt has remained stable without overt bleeding, pain, fever or other bowel change.  Pt denies chest pain, increased sob or doe, wheezing, orthopnea, PND, increased LE swelling, palpitations, dizziness or syncope.   Pt denies polydipsia, polyuria, denies new focal neuro s/s.  Has not specific f/u appt with GI.    Also, current hoyer lift is poorly servicable.  Pt continues bedridden and requires hoyer lift to move from the bed to other such as chair.    Past Medical History:  Diagnosis Date  . ALLERGIC RHINITIS 12/09/2006  . DEGENERATIVE JOINT DISEASE, LEFT KNEE 01/23/2007  . Encephalocele ([place]) 01/23/2007  . FEVER UNSPECIFIED 01/28/2010  . GERD 12/09/2006  . GLUCOSE INTOLERANCE 01/24/2009  . GOUT 12/09/2006  . HYPERCHOLESTEROLEMIA 12/09/2006  . HYPERLIPIDEMIA 12/18/2006  . HYPERTENSION 12/09/2006  . Impaired glucose tolerance 07/26/2010  . MENOPAUSAL DISORDER 01/25/2008  . Stroke [facility])    No  results found for this or any previous visit (from the past 48 hour(s)).  Current Outpatient Medications on File Prior to Visit  Medication Sig Dispense Refill  . amLODipine (NORVASC) 5 MG tablet TAKE 1 & 1/2 (ONE & ONE-HALF) TABLETS BY MOUTH DAILY 135 tablet 0  . benazepril (LOTENSIN) 40 MG tablet TAKE ONE TABLET BY MOUTH ONCE DAILY 90 tablet 3  . cetirizine (ZYRTEC) 10 MG tablet Take 1 tablet by mouth once daily 30 tablet 0  . donepezil (ARICEPT) 10 MG tablet TAKE 1 TABLET BY MOUTH ONCE DAILY 90 tablet 3  . guaiFENesin (MUCINEX) 600 MG 12 hr tablet Take 2 tablets (1,200 mg total) by mouth 2 (two) times daily as needed. 60 tablet 1  . montelukast (SINGULAIR) 10 MG tablet Take 1 tablet (10 mg total) by mouth daily. 30 tablet 11  . nystatin (NYSTATIN) powder USE AS DIRECTED TWICE DAILY AS NEEDED 45 g 0  . pantoprazole (PROTONIX) 40 MG tablet Take 1 tablet (40 mg total) by mouth 2 (two) times daily. 90 tablet 0  . QUEtiapine (SEROQUEL) 25 MG tablet Take 1 tablet (25 mg total) by mouth at bedtime. 90 tablet 3  . traZODone (DESYREL) 50 MG tablet TAKE 1/2 TO 1 (ONE-HALF TO ONE) TABLET BY MOUTH AT BEDTIME AS NEEDED FOR SLEEP 90 tablet 0  . triamcinolone (NASACORT) 55 MCG/ACT AERO nasal inhaler Place 2 sprays into the nose daily. 1 Inhaler 12  . [DISCONTINUED] apixaban (ELIQUIS) 5 MG TABS tablet Take 1 tablet (5 mg total) by mouth 2 (two) times daily. (Patient  not taking: Reported on 04/03/2020) 60 tablet 11  . [DISCONTINUED] levothyroxine (SYNTHROID, LEVOTHROID) 25 MCG tablet Take 0.5 tablets (12.5 mcg total) by mouth daily before breakfast. (Patient not taking: Reported on 04/03/2020) 30 tablet 0   No current facility-administered medications on file prior to visit.   Lab Results  Component Value Date   WBC 7.1 07/26/2020   HGB 11.2 (L) 07/26/2020   HCT 37.3 07/26/2020   PLT 320 07/26/2020   GLUCOSE 98 07/26/2020   CHOL 212 (H) 12/15/2016   TRIG 189.0 (H) 12/15/2016   HDL 31.80 (L) 12/15/2016    LDLDIRECT 139.0 01/01/2016   LDLCALC 142 (H) 12/15/2016   ALT 12 07/22/2020   AST 20 07/22/2020   NA 140 07/26/2020   K 3.9 07/26/2020   CL 109 07/26/2020   CREATININE 0.68 07/26/2020   BUN 7 (L) 07/26/2020   CO2 26 07/26/2020   TSH 2.687 07/24/2020   INR 0.9 07/22/2020   HGBA1C 6.4 12/15/2016   A/P/next steps:   Hematochezia - none post colonoscopy with adenoma,  and biopsy c/w ischemic colitis; ok to continue to monitor and no specific GI f/u needed  Chronic anticoagulation - ok for xarelto restart,  to f/u any worsening symptoms or concerns  GERD - to cont PPI bid       - no further lab work needed, for f/u 6 mo  [name], MD

## 2020-08-04 ENCOUNTER — Encounter: Payer: Self-pay | Admitting: Internal Medicine

## 2020-08-04 NOTE — Patient Instructions (Signed)
Ok to restart xarelto

## 2020-08-04 NOTE — Assessment & Plan Note (Signed)
See notes

## 2020-08-07 ENCOUNTER — Telehealth: Payer: Self-pay | Admitting: Internal Medicine

## 2020-08-07 DIAGNOSIS — Z8673 Personal history of transient ischemic attack (TIA), and cerebral infarction without residual deficits: Secondary | ICD-10-CM

## 2020-08-07 DIAGNOSIS — I5032 Chronic diastolic (congestive) heart failure: Secondary | ICD-10-CM

## 2020-08-07 DIAGNOSIS — F01518 Vascular dementia, unspecified severity, with other behavioral disturbance: Secondary | ICD-10-CM

## 2020-08-07 DIAGNOSIS — J9611 Chronic respiratory failure with hypoxia: Secondary | ICD-10-CM

## 2020-08-07 DIAGNOSIS — F0151 Vascular dementia with behavioral disturbance: Secondary | ICD-10-CM

## 2020-08-07 NOTE — Telephone Encounter (Signed)
Patients daughter called and said they are needing a new order for the patient to get a new hoyer lift. The tires on her are coming off and [employer] said it can not be repaired the whole lift needs to be replaced. Please advise

## 2020-08-07 NOTE — Telephone Encounter (Signed)
Done hardcopy to s summ[facility] 

## 2020-08-08 NOTE — Telephone Encounter (Signed)
Hard copy faxed to [employer]

## 2020-08-13 NOTE — Telephone Encounter (Signed)
That would be very difficult since there is none, only a phone note

## 2020-08-13 NOTE — Telephone Encounter (Signed)
    Follow up message   Heather [name] from [employer] requesting office notes that mention hoyer lift be refaxed to [phone] Phone [phone]

## 2020-08-15 NOTE — Telephone Encounter (Signed)
Spoke with [employer] and they stated that the telephone note is considered as an office note. Okay to fax this telephone note with proof that Dr. Jenny Reichmann gave the okay for new hoyer lift.

## 2020-08-19 NOTE — Telephone Encounter (Signed)
Heather [name] w/ [employer] called and said that they are needing office notes discussing the hoyer lift. She said that the visit can be virtual or the patient can come in office. She said a telephone note will not work. Please advise

## 2020-08-21 NOTE — Telephone Encounter (Signed)
Left message for patient's daughter to call back. Patient needs either a virtual visit or office visit to discuss order for hoyer lift.

## 2020-08-23 DIAGNOSIS — F0151 Vascular dementia with behavioral disturbance: Secondary | ICD-10-CM | POA: Diagnosis not present

## 2020-08-23 DIAGNOSIS — J9611 Chronic respiratory failure with hypoxia: Secondary | ICD-10-CM | POA: Diagnosis not present

## 2020-08-23 DIAGNOSIS — Z8673 Personal history of transient ischemic attack (TIA), and cerebral infarction without residual deficits: Secondary | ICD-10-CM | POA: Diagnosis not present

## 2020-08-23 DIAGNOSIS — I5032 Chronic diastolic (congestive) heart failure: Secondary | ICD-10-CM | POA: Diagnosis not present

## 2020-08-26 ENCOUNTER — Other Ambulatory Visit: Payer: Self-pay | Admitting: Internal Medicine

## 2020-08-29 NOTE — Telephone Encounter (Signed)
Spoke with patient's daughter, Neoma Laming and she states that hoyer lift has been delivered and no visit is needed

## 2020-09-02 ENCOUNTER — Other Ambulatory Visit: Payer: Self-pay

## 2020-09-02 ENCOUNTER — Emergency Department (HOSPITAL_COMMUNITY)
Admission: EM | Admit: 2020-09-02 | Discharge: 2020-09-02 | Disposition: A | Discharge status: Home / Self Care | Payer: Medicare Other | Attending: Emergency Medicine | Admitting: Emergency Medicine

## 2020-09-02 ENCOUNTER — Encounter (HOSPITAL_COMMUNITY): Payer: Self-pay | Admitting: Emergency Medicine

## 2020-09-02 DIAGNOSIS — R0902 Hypoxemia: Secondary | ICD-10-CM | POA: Diagnosis not present

## 2020-09-02 DIAGNOSIS — Z79899 Other long term (current) drug therapy: Secondary | ICD-10-CM | POA: Insufficient documentation

## 2020-09-02 DIAGNOSIS — R410 Disorientation, unspecified: Secondary | ICD-10-CM | POA: Diagnosis not present

## 2020-09-02 DIAGNOSIS — L02811 Cutaneous abscess of head [any part, except face]: Secondary | ICD-10-CM | POA: Diagnosis not present

## 2020-09-02 DIAGNOSIS — R404 Transient alteration of awareness: Secondary | ICD-10-CM | POA: Diagnosis not present

## 2020-09-02 DIAGNOSIS — I130 Hypertensive heart and chronic kidney disease with heart failure and stage 1 through stage 4 chronic kidney disease, or unspecified chronic kidney disease: Secondary | ICD-10-CM | POA: Insufficient documentation

## 2020-09-02 DIAGNOSIS — Z86010 Personal history of colonic polyps: Secondary | ICD-10-CM | POA: Diagnosis not present

## 2020-09-02 DIAGNOSIS — N183 Chronic kidney disease, stage 3 unspecified: Secondary | ICD-10-CM | POA: Insufficient documentation

## 2020-09-02 DIAGNOSIS — L0201 Cutaneous abscess of face: Secondary | ICD-10-CM | POA: Diagnosis not present

## 2020-09-02 DIAGNOSIS — F0391 Unspecified dementia with behavioral disturbance: Secondary | ICD-10-CM | POA: Insufficient documentation

## 2020-09-02 DIAGNOSIS — I5032 Chronic diastolic (congestive) heart failure: Secondary | ICD-10-CM | POA: Diagnosis not present

## 2020-09-02 DIAGNOSIS — R5381 Other malaise: Secondary | ICD-10-CM | POA: Diagnosis not present

## 2020-09-02 MED ORDER — CLINDAMYCIN HCL 300 MG PO CAPS: 300.0000 mg | ORAL_CAPSULE | Freq: Three times a day (TID) | ORAL | 0 refills | Status: AC

## 2020-09-02 MED ORDER — CLINDAMYCIN HCL 300 MG PO CAPS
300.0000 mg | ORAL_CAPSULE | Freq: Once | ORAL | Status: AC
  Administered 2020-09-02: 300 mg via ORAL
  Filled 2020-09-02: qty 1

## 2020-09-02 NOTE — ED Provider Notes (Signed)
[place] DEPT Provider Note   CSN: [id number] Arrival date & time: 09/02/20  1003     History Chief Complaint  Patient presents with  . Abscess    Heather [name] is a 87 y.o. female.  The history is provided by the patient and a caregiver.  Abscess Abscess location: FOREHEAD. Abscess quality: fluctuance   Duration:  6 days Progression:  Worsening Chronicity:  New Context: insect bite/sting   Relieved by:  Nothing Worsened by:  Nothing Associated symptoms: no fever and no headaches        Past Medical History:  Diagnosis Date  . ALLERGIC RHINITIS 12/09/2006  . DEGENERATIVE JOINT DISEASE, LEFT KNEE 01/23/2007  . Encephalocele ([place]) 01/23/2007  . FEVER UNSPECIFIED 01/28/2010  . GERD 12/09/2006  . GLUCOSE INTOLERANCE 01/24/2009  . GOUT 12/09/2006  . HYPERCHOLESTEROLEMIA 12/09/2006  . HYPERLIPIDEMIA 12/18/2006  . HYPERTENSION 12/09/2006  . Impaired glucose tolerance 07/26/2010  . MENOPAUSAL DISORDER 01/25/2008  . Stroke [facility])     Patient Active Problem List   Diagnosis Date Noted  . Rectal bleeding   . Benign neoplasm of transverse colon   . Benign neoplasm of descending colon   . Segmental colitis with rectal bleeding ([place])   . Lower GI bleed   . Long term current use of anticoagulant therapy   . GIB (gastrointestinal bleeding) 07/23/2020  . History of CVA (cerebrovascular accident) 07/23/2020  . Abnormal TSH 08/17/2017  . Bradycardia, drug induced   . Syncope, cardiogenic 06/23/2017  . Sinus bradycardia 06/23/2017  . History of completed stroke 06/23/2017  . Subclinical hypothyroidism 06/23/2017  . Nausea without vomiting 02/16/2017  . Facial droop   . Transient alteration of awareness   . Acute metabolic encephalopathy 52/77/8242  . Seizure ([place]) 01/25/2017  . Adhesive capsulitis of left shoulder 01/02/2017  . Acute bilateral low back pain without sciatica 01/02/2017  . Gait disorder 12/17/2016  . Do not resuscitate  discussion 12/17/2016  . Right shoulder pain 09/25/2016  . Insomnia 08/16/2016  . Dysuria 08/16/2016  . Chronic hypoxemic respiratory failure ([place]) 06/12/2016  . Nocturia 03/17/2016  . Mild dementia ([place]) 03/09/2016  . Stroke syndrome 03/09/2016  . CKD (chronic kidney disease), stage III ([place]) 02/05/2016  . Rash 02/05/2016  . Personal history of DVT (deep vein thrombosis) 01/28/2016  . Diastolic CHF, chronic ([place]) 01/28/2016  . Acute kidney injury ([place])   . Pulmonary embolus ([place])   . Acute respiratory failure with hypoxia ([place]) 01/24/2016  . Dementia with behavioral disturbance ([place]) 01/01/2016  . Non compliance w medication regimen 01/01/2016  . General weakness 01/01/2016  . Peripheral edema 01/01/2016  . Chronic venous insufficiency 08/01/2014  . Fall at home 08/01/2013  . Osteopenia 08/01/2013  . Left knee pain 01/29/2011  . Preventative health care 07/26/2010  . Impaired glucose tolerance 07/26/2010  . MENOPAUSAL DISORDER 01/25/2008  . Osteoarthrosis involving lower leg 01/23/2007  . Encephalocele ([place]) 01/23/2007  . Hyperlipidemia 12/18/2006  . GOUT 12/09/2006  . Essential hypertension 12/09/2006  . Allergic rhinitis 12/09/2006  . GERD 12/09/2006    Past Surgical History:  Procedure Laterality Date  . BIOPSY  07/25/2020   Procedure: BIOPSY;  Surgeon: Jerene Bears, MD;  Location: [facility];  Service: Endoscopy;;  . COLONOSCOPY WITH PROPOFOL N/A 07/25/2020   Procedure: COLONOSCOPY WITH PROPOFOL;  Surgeon: Jerene Bears, MD;  [facility];  Service: Endoscopy;  Laterality: N/A;  . POLYPECTOMY  07/25/2020   Procedure: POLYPECTOMY;  Surgeon: Jerene Bears,  MD;  [facility];  Service: Endoscopy;;  . s/p anterior encephalocele repair and craniotomy  2003     OB History   No obstetric history on file.     Family History  Problem Relation Age of Onset  . Hypertension Other     Social History   Tobacco Use  . Smoking status: Never Smoker   . Smokeless tobacco: Never Used  Vaping Use  . Vaping Use: Never used  Substance Use Topics  . Alcohol use: No  . Drug use: No    Home Medications Prior to Admission medications   Medication Sig Start Date End Date Taking? Authorizing Provider  clindamycin (CLEOCIN) 300 MG capsule Take 1 capsule (300 mg total) by mouth 3 (three) times daily for 10 days. 09/02/20 09/12/20 Yes Aziyah Provencal, DO  amLODipine (NORVASC) 5 MG tablet TAKE 1 & 1/2 (ONE & ONE-HALF) TABLETS BY MOUTH DAILY 06/05/20   Biagio Borg, MD  benazepril (LOTENSIN) 40 MG tablet TAKE ONE TABLET BY MOUTH ONCE DAILY 02/01/17   Biagio Borg, MD  donepezil (ARICEPT) 10 MG tablet TAKE 1 TABLET BY MOUTH ONCE DAILY 04/03/20   Cameron Sprang, MD  EQ ALLERGY RELIEF, CETIRIZINE, 10 MG tablet Take 1 tablet by mouth once daily 08/26/20   Biagio Borg, MD  guaiFENesin (MUCINEX) 600 MG 12 hr tablet Take 2 tablets (1,200 mg total) by mouth 2 (two) times daily as needed. 01/31/19   Biagio Borg, MD  montelukast (SINGULAIR) 10 MG tablet Take 1 tablet (10 mg total) by mouth daily. 06/04/20   Biagio Borg, MD  nystatin (NYSTATIN) powder USE AS DIRECTED TWICE DAILY AS NEEDED 02/02/20   Biagio Borg, MD  pantoprazole (PROTONIX) 40 MG tablet Take 1 tablet (40 mg total) by mouth 2 (two) times daily. 07/26/20   Spongberg, Audie Pinto, MD  QUEtiapine (SEROQUEL) 25 MG tablet Take 1 tablet (25 mg total) by mouth at bedtime. 04/03/20   Cameron Sprang, MD  traZODone (DESYREL) 50 MG tablet TAKE 1/2 TO 1 (ONE-HALF TO ONE) TABLET BY MOUTH AT BEDTIME AS NEEDED FOR SLEEP 08/26/20   Biagio Borg, MD  triamcinolone (NASACORT) 55 MCG/ACT AERO nasal inhaler Place 2 sprays into the nose daily. 01/31/19   Biagio Borg, MD  apixaban (ELIQUIS) 5 MG TABS tablet Take 1 tablet (5 mg total) by mouth 2 (two) times daily. Patient not taking: Reported on 04/03/2020 11/29/19 07/22/20  Biagio Borg, MD  levothyroxine (SYNTHROID, LEVOTHROID) 25 MCG tablet Take 0.5 tablets  (12.5 mcg total) by mouth daily before breakfast. Patient not taking: Reported on 04/03/2020 06/28/17 07/22/20  Velvet Bathe, MD    Allergies    Lipitor [atorvastatin], Codeine, Lovastatin, and Pravastatin sodium  Review of Systems   Review of Systems  Constitutional: Negative for fever.  HENT: Negative for congestion.   Cardiovascular: Negative for chest pain.  Musculoskeletal: Negative for arthralgias and back pain.  Skin: Positive for color change.  Neurological: Negative for dizziness, tremors, seizures, syncope, facial asymmetry, speech difficulty, weakness, light-headedness, numbness and headaches.    Physical Exam Updated Vital Signs BP (!) 152/77 (BP Location: Right Arm)   Pulse 92   Temp 97.8 F (36.6 C) (Oral)   Resp 18   Ht 5' (1.524 m)   Wt 75 kg   SpO2 94%   BMI 32.29 kg/m   Physical Exam Constitutional:      General: She is not in acute distress.    Appearance:  She is not ill-appearing.  Eyes:     Extraocular Movements: Extraocular movements intact.     Pupils: Pupils are equal, round, and reactive to light.  Cardiovascular:     Pulses: Normal pulses.  Skin:    Comments: Fluctuance to the top of the forehead/scalp line about 6 cm in diameter with an area of some dry draining purulence  Neurological:     General: No focal deficit present.     Mental Status: She is alert.     Comments: Overall normal strength and sensation     ED Results / Procedures / Treatments   Labs (all labs ordered are listed, but only abnormal results are displayed) Labs Reviewed - No data to display  EKG None  Radiology No results found.  Procedures .Marland KitchenIncision and Drainage  Date/Time: 09/02/2020 11:41 AM Performed by: Lennice Sites, DO Authorized by: Lennice Sites, DO   Consent:    Consent obtained:  Verbal   Consent given by:  Guardian   Risks, benefits, and alternatives were discussed: yes     Risks discussed:  Damage to other organs, infection, incomplete  drainage, bleeding and pain Universal protocol:    Procedure explained and questions answered to patient or proxy's satisfaction: yes     Relevant documents present and verified: yes     Patient identity confirmed:  Arm band Location:    Type:  Abscess   Size:  6 cm by 4 cm   Location: scalp/forhead. Pre-procedure details:    Skin preparation:  Chlorhexidine Sedation:    Sedation type:  None Anesthesia:    Anesthesia method:  None Procedure type:    Complexity:  Simple Procedure details:    Ultrasound guidance: no     Needle aspiration: no     Incision types:  Stab incision   Incision depth:  Subcutaneous   Wound management:  Probed and deloculated   Drainage:  Purulent   Drainage amount:  Copious   Wound treatment:  Wound left open   Packing materials:  None Post-procedure details:    Procedure completion:  Tolerated     Medications Ordered in ED Medications  clindamycin (CLEOCIN) capsule 300 mg (300 mg Oral Given 09/02/20 1129)    ED Course  I have reviewed the triage vital signs and the nursing notes.  Pertinent labs & imaging results that were available during my care of the patient were reviewed by me and considered in my medical decision making (see chart for details).    MDM Rules/Calculators/A&P                          Heather [name] is here with forehead abscess.  With history of dementia.  Is on blood thinners.  No falls or trauma.  Per caregiver patient with wound to the forehead that appear to be like an insect bite that has slowly gotten bigger over the last 4 to 5 days.  No concern for any trauma or falls.  Shared decision making was made to hold off on any CT imaging as no concern for traumatic process.  She has dried pus at the head of this forehead abscess.  Likely seems to be infectious hair follicles.  I&D was performed and copious amounts of purulent fluid was expressed.  Will prescribe clindamycin.  Wound care instructions given.  Understands need for  wound recheck and discharged in ED in good condition.  Overall localized infectious process.  No concern for sepsis.  This chart was dictated using voice recognition software.  Despite best efforts to proofread,  errors can occur which can change the documentation meaning.    Final Clinical Impression(s) / ED Diagnoses Final diagnoses:  Abscess of forehead    Rx / DC Orders ED Discharge Orders         Ordered    clindamycin (CLEOCIN) 300 MG capsule  3 times daily        09/02/20 1138           [place], DO 09/02/20 1143

## 2020-09-02 NOTE — ED Notes (Signed)
[facility] called for transport back to residence 

## 2020-09-02 NOTE — ED Triage Notes (Signed)
BIB EMS from home, patient's family noticed a bump on her forehead last Tuesday, has been getting bigger since then, denies any falls or head trauma. Pt has hx of dementia. Non-ambulatory at baseline. Spot is golf-ball sized and appears to be fluid-filled.

## 2020-09-02 NOTE — Discharge Instructions (Signed)
Continue warm compresses as discussed.  Take antibiotics as prescribed.  Follow-up with primary care doctor for wound recheck in 72 hours or return to the ED if symptoms worsen.

## 2020-09-08 ENCOUNTER — Other Ambulatory Visit: Payer: Self-pay | Admitting: Internal Medicine

## 2020-09-08 NOTE — Telephone Encounter (Signed)
Please refill as per office routine med refill policy (all routine meds refilled for 3 mo or monthly per pt preference up to one year from last visit, then month to month grace period for 3 mo, then further med refills will have to be denied)  

## 2020-10-29 ENCOUNTER — Other Ambulatory Visit: Payer: Self-pay | Admitting: Internal Medicine

## 2020-10-29 NOTE — Telephone Encounter (Signed)
Please refill as per office routine med refill policy (all routine meds refilled for 3 mo or monthly per pt preference up to one year from last visit, then month to month grace period for 3 mo, then further med refills will have to be denied)  

## 2020-11-23 DIAGNOSIS — J9611 Chronic respiratory failure with hypoxia: Secondary | ICD-10-CM | POA: Diagnosis not present

## 2020-11-23 DIAGNOSIS — Z8673 Personal history of transient ischemic attack (TIA), and cerebral infarction without residual deficits: Secondary | ICD-10-CM | POA: Diagnosis not present

## 2020-11-23 DIAGNOSIS — F0151 Vascular dementia with behavioral disturbance: Secondary | ICD-10-CM | POA: Diagnosis not present

## 2020-11-23 DIAGNOSIS — I5032 Chronic diastolic (congestive) heart failure: Secondary | ICD-10-CM | POA: Diagnosis not present

## 2020-12-02 ENCOUNTER — Other Ambulatory Visit: Payer: Self-pay | Admitting: Internal Medicine

## 2020-12-02 NOTE — Telephone Encounter (Signed)
Please refill as per office routine med refill policy (all routine meds refilled for 3 mo or monthly per pt preference up to one year from last visit, then month to month grace period for 3 mo, then further med refills will have to be denied)  

## 2020-12-24 DIAGNOSIS — I5032 Chronic diastolic (congestive) heart failure: Secondary | ICD-10-CM | POA: Diagnosis not present

## 2020-12-24 DIAGNOSIS — J9611 Chronic respiratory failure with hypoxia: Secondary | ICD-10-CM | POA: Diagnosis not present

## 2020-12-24 DIAGNOSIS — F0151 Vascular dementia with behavioral disturbance: Secondary | ICD-10-CM | POA: Diagnosis not present

## 2020-12-24 DIAGNOSIS — Z8673 Personal history of transient ischemic attack (TIA), and cerebral infarction without residual deficits: Secondary | ICD-10-CM | POA: Diagnosis not present

## 2021-01-14 ENCOUNTER — Other Ambulatory Visit: Payer: Self-pay | Admitting: Internal Medicine

## 2021-01-14 NOTE — Telephone Encounter (Signed)
Please refill as per office routine med refill policy (all routine meds to be refilled for 3 mo or monthly (per pt preference) up to one year from last visit, then month to month grace period for 3 mo, then further med refills will have to be denied) ? ?

## 2021-01-15 MED ORDER — AMLODIPINE BESYLATE 5 MG PO TABS: ORAL_TABLET | ORAL | 1 refills | Status: DC

## 2021-01-15 NOTE — Addendum Note (Signed)
Addended by: Biagio Borg on: 01/15/2021 05:02 PM   Modules accepted: Orders

## 2021-01-15 NOTE — Telephone Encounter (Signed)
Pt last seen apr 2022  [facility] for refill as per office policy

## 2021-01-15 NOTE — Addendum Note (Signed)
Addended by: Biagio Borg on: 01/15/2021 05:03 PM   Modules accepted: Orders

## 2021-01-23 DIAGNOSIS — J9611 Chronic respiratory failure with hypoxia: Secondary | ICD-10-CM | POA: Diagnosis not present

## 2021-01-23 DIAGNOSIS — I5032 Chronic diastolic (congestive) heart failure: Secondary | ICD-10-CM | POA: Diagnosis not present

## 2021-01-23 DIAGNOSIS — F01518 Vascular dementia, unspecified severity, with other behavioral disturbance: Secondary | ICD-10-CM | POA: Diagnosis not present

## 2021-01-23 DIAGNOSIS — Z8673 Personal history of transient ischemic attack (TIA), and cerebral infarction without residual deficits: Secondary | ICD-10-CM | POA: Diagnosis not present

## 2021-02-23 DIAGNOSIS — I5032 Chronic diastolic (congestive) heart failure: Secondary | ICD-10-CM | POA: Diagnosis not present

## 2021-02-23 DIAGNOSIS — F01518 Vascular dementia, unspecified severity, with other behavioral disturbance: Secondary | ICD-10-CM | POA: Diagnosis not present

## 2021-02-23 DIAGNOSIS — Z8673 Personal history of transient ischemic attack (TIA), and cerebral infarction without residual deficits: Secondary | ICD-10-CM | POA: Diagnosis not present

## 2021-02-23 DIAGNOSIS — J9611 Chronic respiratory failure with hypoxia: Secondary | ICD-10-CM | POA: Diagnosis not present

## 2021-02-27 ENCOUNTER — Other Ambulatory Visit: Payer: Self-pay | Admitting: Internal Medicine

## 2021-03-25 DIAGNOSIS — I5032 Chronic diastolic (congestive) heart failure: Secondary | ICD-10-CM | POA: Diagnosis not present

## 2021-03-25 DIAGNOSIS — F01518 Vascular dementia, unspecified severity, with other behavioral disturbance: Secondary | ICD-10-CM | POA: Diagnosis not present

## 2021-03-25 DIAGNOSIS — Z8673 Personal history of transient ischemic attack (TIA), and cerebral infarction without residual deficits: Secondary | ICD-10-CM | POA: Diagnosis not present

## 2021-03-25 DIAGNOSIS — J9611 Chronic respiratory failure with hypoxia: Secondary | ICD-10-CM | POA: Diagnosis not present

## 2021-03-28 ENCOUNTER — Telehealth: Payer: Self-pay | Admitting: Internal Medicine

## 2021-03-28 NOTE — Telephone Encounter (Signed)
Patient's daughter [name] states patient is immobile and unable to get her flu vaccine  Caller states patient does not have a [place] states patient's [facility] agency no longer accepts patient's insurance  Caller is requesting a [place] agency referral that accepts [employer] is requesting a call back for recommendations on how patient can receive her flu vaccine

## 2021-03-29 NOTE — Telephone Encounter (Signed)
Unfortunately, I am unable to order [facility] as the rules say pt has to be seen within 90 days of the order; pt last seen on [facility]V in April 2022  Needs Video Visit

## 2021-03-31 NOTE — Telephone Encounter (Signed)
Patient has been scheduled for 04/08/21

## 2021-04-08 ENCOUNTER — Telehealth (INDEPENDENT_AMBULATORY_CARE_PROVIDER_SITE_OTHER): Payer: Medicare Other | Admitting: Internal Medicine

## 2021-04-08 ENCOUNTER — Other Ambulatory Visit: Payer: Self-pay

## 2021-04-08 DIAGNOSIS — Z8673 Personal history of transient ischemic attack (TIA), and cerebral infarction without residual deficits: Secondary | ICD-10-CM

## 2021-04-08 DIAGNOSIS — R7302 Impaired glucose tolerance (oral): Secondary | ICD-10-CM

## 2021-04-08 DIAGNOSIS — I10 Essential (primary) hypertension: Secondary | ICD-10-CM | POA: Diagnosis not present

## 2021-04-08 DIAGNOSIS — R531 Weakness: Secondary | ICD-10-CM | POA: Diagnosis not present

## 2021-04-08 DIAGNOSIS — Z0001 Encounter for general adult medical examination with abnormal findings: Secondary | ICD-10-CM

## 2021-04-08 DIAGNOSIS — F03918 Unspecified dementia, unspecified severity, with other behavioral disturbance: Secondary | ICD-10-CM

## 2021-04-08 DIAGNOSIS — E785 Hyperlipidemia, unspecified: Secondary | ICD-10-CM

## 2021-04-08 NOTE — Progress Notes (Deleted)
Patient ID: Heather [name], female   DOB: 01/20/1929, 87 y.o.   MRN: [id number]         Chief Complaint:: wellness exam and No chief complaint on file.  ***       HPI:  Heather [name] is a 87 y.o. female here for wellness exam                        Also***   Wt Readings from Last 3 Encounters:  09/02/20 165 lb 5.5 oz (75 kg)  07/25/20 158 lb 11.7 oz (72 kg)  04/03/20 160 lb (72.6 kg)   BP Readings from Last 3 Encounters:  09/02/20 (!) 150/76  07/26/20 (!) 149/53  05/30/20 136/79   Immunization History  Administered Date(s) Administered   Fluad Quad(high Dose 65+) 01/02/2019   Influenza Split 01/29/2011, 01/28/2012   Influenza Whole 02/17/2006, 01/25/2008, 01/24/2009, 01/28/2010   Influenza, High Dose Seasonal PF 01/31/2013, 03/19/2015, 12/17/2016, 02/17/2018   Influenza,inj,Quad PF,6+ Mos 01/30/2014, 01/01/2016   Pneumococcal Conjugate-13 08/01/2013   Pneumococcal Polysaccharide-23 03/12/2006, 01/29/2011   Td 04/14/1995, 01/24/2009   Health Maintenance Due  Topic Date Due   COVID-19 Vaccine (1) Never done   Zoster Vaccines- Shingrix (1 of 2) Never done   TETANUS/TDAP  01/25/2019   INFLUENZA VACCINE  11/11/2020      Past Medical History:  Diagnosis Date   ALLERGIC RHINITIS 12/09/2006   DEGENERATIVE JOINT DISEASE, LEFT KNEE 01/23/2007   Encephalocele ([place]) 01/23/2007   FEVER UNSPECIFIED 01/28/2010   GERD 12/09/2006   GLUCOSE INTOLERANCE 01/24/2009   GOUT 12/09/2006   HYPERCHOLESTEROLEMIA 12/09/2006   HYPERLIPIDEMIA 12/18/2006   HYPERTENSION 12/09/2006   Impaired glucose tolerance 07/26/2010   MENOPAUSAL DISORDER 01/25/2008   Stroke [facility])    Past Surgical History:  Procedure Laterality Date   BIOPSY  07/25/2020   Procedure: BIOPSY;  Surgeon: Jerene Bears, MD;  [facility];  Service: Endoscopy;;   COLONOSCOPY WITH PROPOFOL N/A 07/25/2020   Procedure: COLONOSCOPY WITH PROPOFOL;  Surgeon: Jerene Bears, MD;  [facility];  Service: Endoscopy;   Laterality: N/A;   POLYPECTOMY  07/25/2020   Procedure: POLYPECTOMY;  Surgeon: Jerene Bears, MD;  [facility];  Service: Endoscopy;;   s/p anterior encephalocele repair and craniotomy  2003    reports that she has never smoked. She has never used smokeless tobacco. She reports that she does not drink alcohol and does not use drugs. family history includes Hypertension in an other family member. Allergies  Allergen Reactions   Lipitor [Atorvastatin] Other (See Comments)    myalgia   Codeine Other (See Comments)   Lovastatin Other (See Comments)   Pravastatin Sodium Other (See Comments)   Current Outpatient Medications on File Prior to Visit  Medication Sig Dispense Refill   amLODipine (NORVASC) 5 MG tablet TAKE 1 & 1/2 (ONE & ONE-HALF) TABLETS BY MOUTH ONCE DAILY 135 tablet 1   benazepril (LOTENSIN) 40 MG tablet TAKE ONE TABLET BY MOUTH ONCE DAILY 90 tablet 3   donepezil (ARICEPT) 10 MG tablet TAKE 1 TABLET BY MOUTH ONCE DAILY 90 tablet 3   EQ ALLERGY RELIEF, CETIRIZINE, 10 MG tablet Take 1 tablet by mouth once daily 90 tablet 3   guaiFENesin (MUCINEX) 600 MG 12 hr tablet Take 2 tablets (1,200 mg total) by mouth 2 (two) times daily as needed. 60 tablet 1   montelukast (SINGULAIR) 10 MG tablet Take 1 tablet (10 mg total) by mouth daily.  30 tablet 11   nystatin (NYSTATIN) powder USE AS DIRECTED TWICE DAILY AS NEEDED 45 g 0   pantoprazole (PROTONIX) 40 MG tablet Take 1 tablet (40 mg total) by mouth 2 (two) times daily. 90 tablet 0   QUEtiapine (SEROQUEL) 25 MG tablet Take 1 tablet (25 mg total) by mouth at bedtime. 90 tablet 3   traZODone (DESYREL) 50 MG tablet TAKE 1/2 TO 1 (ONE-HALF TO ONE) TABLET BY MOUTH AT BEDTIME AS NEEDED FOR SLEEP 90 tablet 1   triamcinolone (NASACORT) 55 MCG/ACT AERO nasal inhaler Place 2 sprays into the nose daily. 1 Inhaler 12   [DISCONTINUED] apixaban (ELIQUIS) 5 MG TABS tablet Take 1 tablet (5 mg total) by mouth 2 (two) times daily. (Patient not  taking: Reported on 04/03/2020) 60 tablet 11   [DISCONTINUED] levothyroxine (SYNTHROID, LEVOTHROID) 25 MCG tablet Take 0.5 tablets (12.5 mcg total) by mouth daily before breakfast. (Patient not taking: Reported on 04/03/2020) 30 tablet 0   No current facility-administered medications on file prior to visit.        ROS:  All others reviewed and negative.  Objective        PE:  There were no vitals taken for this visit.                Constitutional: Pt appears in NAD               HENT: Head: NCAT.                Right Ear: External ear normal.                 Left Ear: External ear normal.                Eyes: . Pupils are equal, round, and reactive to light. Conjunctivae and EOM are normal               Nose: without d/c or deformity               Neck: Neck supple. Gross normal ROM               Cardiovascular: Normal rate and regular rhythm.                 Pulmonary/Chest: Effort normal and breath sounds without rales or wheezing.                Abd:  Soft, NT, ND, + BS, no organomegaly               Neurological: Pt is alert. At baseline orientation, motor grossly intact               Skin: Skin is warm. No rashes, no other new lesions, LE edema - ***               Psychiatric: Pt behavior is normal without agitation   Micro: none  Cardiac tracings I have personally interpreted today:  none  Pertinent Radiological findings (summarize): none   Lab Results  Component Value Date   WBC 7.1 07/26/2020   HGB 11.2 (L) 07/26/2020   HCT 37.3 07/26/2020   PLT 320 07/26/2020   GLUCOSE 98 07/26/2020   CHOL 212 (H) 12/15/2016   TRIG 189.0 (H) 12/15/2016   HDL 31.80 (L) 12/15/2016   LDLDIRECT 139.0 01/01/2016   LDLCALC 142 (H) 12/15/2016   ALT 12 07/22/2020   AST 20 07/22/2020   NA 140 07/26/2020   K  3.9 07/26/2020   CL 109 07/26/2020   CREATININE 0.68 07/26/2020   BUN 7 (L) 07/26/2020   CO2 26 07/26/2020   TSH 2.687 07/24/2020   INR 0.9 07/22/2020   HGBA1C 6.4 12/15/2016    Assessment/Plan:  Heather [name] is a 87 y.o. Black or African American [2] female with  has a past medical history of ALLERGIC RHINITIS (12/09/2006), DEGENERATIVE JOINT DISEASE, LEFT KNEE (01/23/2007), Encephalocele ([place]) (01/23/2007), FEVER UNSPECIFIED (01/28/2010), GERD (12/09/2006), GLUCOSE INTOLERANCE (01/24/2009), GOUT (12/09/2006), HYPERCHOLESTEROLEMIA (12/09/2006), HYPERLIPIDEMIA (12/18/2006), HYPERTENSION (12/09/2006), Impaired glucose tolerance (07/26/2010), MENOPAUSAL DISORDER (01/25/2008), and Stroke ([place]).  No problem-specific Assessment & Plan notes found for this encounter.  Followup: No follow-ups on file.  [name], MD 04/08/2021 9:59 AM [facility]

## 2021-04-09 ENCOUNTER — Ambulatory Visit: Payer: Medicare Other | Admitting: Neurology

## 2021-04-11 DIAGNOSIS — Z7901 Long term (current) use of anticoagulants: Secondary | ICD-10-CM | POA: Diagnosis not present

## 2021-04-11 DIAGNOSIS — M109 Gout, unspecified: Secondary | ICD-10-CM | POA: Diagnosis not present

## 2021-04-11 DIAGNOSIS — Z8673 Personal history of transient ischemic attack (TIA), and cerebral infarction without residual deficits: Secondary | ICD-10-CM | POA: Diagnosis not present

## 2021-04-11 DIAGNOSIS — K219 Gastro-esophageal reflux disease without esophagitis: Secondary | ICD-10-CM | POA: Diagnosis not present

## 2021-04-11 DIAGNOSIS — E7800 Pure hypercholesterolemia, unspecified: Secondary | ICD-10-CM | POA: Diagnosis not present

## 2021-04-11 DIAGNOSIS — M6281 Muscle weakness (generalized): Secondary | ICD-10-CM | POA: Diagnosis not present

## 2021-04-11 DIAGNOSIS — M158 Other polyosteoarthritis: Secondary | ICD-10-CM | POA: Diagnosis not present

## 2021-04-11 DIAGNOSIS — Z79891 Long term (current) use of opiate analgesic: Secondary | ICD-10-CM | POA: Diagnosis not present

## 2021-04-11 DIAGNOSIS — I10 Essential (primary) hypertension: Secondary | ICD-10-CM | POA: Diagnosis not present

## 2021-04-11 DIAGNOSIS — M1712 Unilateral primary osteoarthritis, left knee: Secondary | ICD-10-CM | POA: Diagnosis not present

## 2021-04-11 DIAGNOSIS — R7302 Impaired glucose tolerance (oral): Secondary | ICD-10-CM | POA: Diagnosis not present

## 2021-04-11 DIAGNOSIS — F03A0 Unspecified dementia, mild, without behavioral disturbance, psychotic disturbance, mood disturbance, and anxiety: Secondary | ICD-10-CM | POA: Diagnosis not present

## 2021-04-12 ENCOUNTER — Encounter: Payer: Self-pay | Admitting: Internal Medicine

## 2021-04-12 NOTE — Patient Instructions (Signed)
You will be contacted regarding the referral for: [place]

## 2021-04-12 NOTE — Assessment & Plan Note (Signed)
BP Readings from Last 3 Encounters:  09/02/20 (!) 150/76  07/26/20 (!) 149/53  05/30/20 136/79   Mild uncontrolled recently, but daughter states doing well at home, pt to continue medical treatment norvasc, lotensin, declines change for now

## 2021-04-12 NOTE — Assessment & Plan Note (Signed)
stable overall, ok to continue same tx

## 2021-04-12 NOTE — Assessment & Plan Note (Signed)
Lab Results  Component Value Date   LDLCALC 142 (H) 12/15/2016   Uncontrolled but statin intolerant, pt to continue current low chol diet

## 2021-04-12 NOTE — Assessment & Plan Note (Signed)
Stable, no new neuro s/s. Continue eliquis, declines statin

## 2021-04-12 NOTE — Progress Notes (Signed)
Patient ID: Heather [name], female   DOB: 1928/09/04, 87 y.o.   MRN: [id number]  Virtual Visit via Video Note  I connected with Heather [name] on Apr 08, 2021 at  9:40 AM EST by a video enabled telemedicine application and verified that I am speaking with the correct person using two identifiers.  Location of all particpants today Patient: at home with daughter Provider: at office   I discussed the limitations of evaluation and management by telemedicine and the availability of in person appointments. The patient expressed understanding and agreed to proceed.       Chief Complaint:: wellness exam and htn, hx of stroke, hld       HPI:  Heather [name] is a 87 y.o. female here for wellness exam; remains bedbound at home with excellent care and support for the last 2.5 yrs.  Daughter states BP at home < 140/90.  Declines covid booster, shingrix, flu shot and tdap though willing to have flu shot per [facility] later.  Daughter asking for [facility] for further home support now.  Pt denies chest pain, increased sob or doe, wheezing, orthopnea, PND, increased LE swelling, palpitations, dizziness or syncope.   Pt denies polydipsia, polyuria, or new focal neuro s/s.   Pt denies fever, night sweats, loss of appetite, or other constitutional symptoms  Pt denies significant pain.  Has lost 50 lbs in 2 yrs with less calore intake gradually but no dysphagia and Denies worsening reflux, abd pain, dysphagia, n/v, bowel change or blood.  Dementia overall stable symptomatically, and not assoc with behavioral changes such as hallucinations, paranoia, or agitation.  Does c/o ongoing fatigue, but denies signficant daytime hypersomnolence.   Wt Readings from Last 3 Encounters:  09/02/20 165 lb 5.5 oz (75 kg)  07/25/20 158 lb 11.7 oz (72 kg)  04/03/20 160 lb (72.6 kg)   BP Readings from Last 3 Encounters:  09/02/20 (!) 150/76  07/26/20 (!) 149/53  05/30/20 136/79   Immunization History  Administered Date(s) Administered   Fluad  Quad(high Dose 65+) 01/02/2019   Influenza Split 01/29/2011, 01/28/2012   Influenza Whole 02/17/2006, 01/25/2008, 01/24/2009, 01/28/2010   Influenza, High Dose Seasonal PF 01/31/2013, 03/19/2015, 12/17/2016, 02/17/2018   Influenza,inj,Quad PF,6+ Mos 01/30/2014, 01/01/2016   Pneumococcal Conjugate-13 08/01/2013   Pneumococcal Polysaccharide-23 03/12/2006, 01/29/2011   Td 04/14/1995, 01/24/2009  There are no preventive care reminders to display for this patient.    Past Medical History:  Diagnosis Date   ALLERGIC RHINITIS 12/09/2006   DEGENERATIVE JOINT DISEASE, LEFT KNEE 01/23/2007   Encephalocele ([place]) 01/23/2007   FEVER UNSPECIFIED 01/28/2010   GERD 12/09/2006   GLUCOSE INTOLERANCE 01/24/2009   GOUT 12/09/2006   HYPERCHOLESTEROLEMIA 12/09/2006   HYPERLIPIDEMIA 12/18/2006   HYPERTENSION 12/09/2006   Impaired glucose tolerance 07/26/2010   MENOPAUSAL DISORDER 01/25/2008   Stroke [facility])    Past Surgical History:  Procedure Laterality Date   BIOPSY  07/25/2020   Procedure: BIOPSY;  Surgeon: Jerene Bears, MD;  Location: [facility];  Service: Endoscopy;;   COLONOSCOPY WITH PROPOFOL N/A 07/25/2020   Procedure: COLONOSCOPY WITH PROPOFOL;  Surgeon: Jerene Bears, MD;  Location: [facility];  Service: Endoscopy;  Laterality: N/A;   POLYPECTOMY  07/25/2020   Procedure: POLYPECTOMY;  Surgeon: Jerene Bears, MD;  Location: [facility];  Service: Endoscopy;;   s/p anterior encephalocele repair and craniotomy  2003    reports that she has never smoked. She has never used smokeless tobacco. She reports that she does not drink  alcohol and does not use drugs. family history includes Hypertension in an other family member. Allergies  Allergen Reactions   Lipitor [Atorvastatin] Other (See Comments)    myalgia   Codeine Other (See Comments)   Lovastatin Other (See Comments)   Pravastatin Sodium Other (See Comments)   Current Outpatient Medications on File Prior to Visit  Medication Sig Dispense  Refill   amLODipine (NORVASC) 5 MG tablet TAKE 1 & 1/2 (ONE & ONE-HALF) TABLETS BY MOUTH ONCE DAILY 135 tablet 1   benazepril (LOTENSIN) 40 MG tablet TAKE ONE TABLET BY MOUTH ONCE DAILY 90 tablet 3   donepezil (ARICEPT) 10 MG tablet TAKE 1 TABLET BY MOUTH ONCE DAILY 90 tablet 3   EQ ALLERGY RELIEF, CETIRIZINE, 10 MG tablet Take 1 tablet by mouth once daily 90 tablet 3   guaiFENesin (MUCINEX) 600 MG 12 hr tablet Take 2 tablets (1,200 mg total) by mouth 2 (two) times daily as needed. 60 tablet 1   montelukast (SINGULAIR) 10 MG tablet Take 1 tablet (10 mg total) by mouth daily. 30 tablet 11   nystatin (NYSTATIN) powder USE AS DIRECTED TWICE DAILY AS NEEDED 45 g 0   pantoprazole (PROTONIX) 40 MG tablet Take 1 tablet (40 mg total) by mouth 2 (two) times daily. 90 tablet 0   QUEtiapine (SEROQUEL) 25 MG tablet Take 1 tablet (25 mg total) by mouth at bedtime. 90 tablet 3   traZODone (DESYREL) 50 MG tablet TAKE 1/2 TO 1 (ONE-HALF TO ONE) TABLET BY MOUTH AT BEDTIME AS NEEDED FOR SLEEP 90 tablet 1   triamcinolone (NASACORT) 55 MCG/ACT AERO nasal inhaler Place 2 sprays into the nose daily. 1 Inhaler 12   [DISCONTINUED] apixaban (ELIQUIS) 5 MG TABS tablet Take 1 tablet (5 mg total) by mouth 2 (two) times daily. (Patient not taking: Reported on 04/03/2020) 60 tablet 11   [DISCONTINUED] levothyroxine (SYNTHROID, LEVOTHROID) 25 MCG tablet Take 0.5 tablets (12.5 mcg total) by mouth daily before breakfast. (Patient not taking: Reported on 04/03/2020) 30 tablet 0   No current facility-administered medications on file prior to visit.        ROS:  All others reviewed and negative.  Objective        PE:  There were no vitals taken for this visit.                Alert, NAD, appropriate mood and affect, resps normal, cn 2-12 intact, moves all 4s, no visible rash or swelling Micro: none  Cardiac tracings I have personally interpreted today:  none  Pertinent Radiological findings (summarize): none   Lab Results   Component Value Date   WBC 7.1 07/26/2020   HGB 11.2 (L) 07/26/2020   HCT 37.3 07/26/2020   PLT 320 07/26/2020   GLUCOSE 98 07/26/2020   CHOL 212 (H) 12/15/2016   TRIG 189.0 (H) 12/15/2016   HDL 31.80 (L) 12/15/2016   LDLDIRECT 139.0 01/01/2016   LDLCALC 142 (H) 12/15/2016   ALT 12 07/22/2020   AST 20 07/22/2020   NA 140 07/26/2020   K 3.9 07/26/2020   CL 109 07/26/2020   CREATININE 0.68 07/26/2020   BUN 7 (L) 07/26/2020   CO2 26 07/26/2020   TSH 2.687 07/24/2020   INR 0.9 07/22/2020   HGBA1C 6.4 12/15/2016   Assessment/Plan:  Heather [name] is a 87 y.o. Black or African American [2] female with  has a past medical history of ALLERGIC RHINITIS (12/09/2006), DEGENERATIVE JOINT DISEASE, LEFT KNEE (01/23/2007), Encephalocele ([place]) (01/23/2007), FEVER  UNSPECIFIED (01/28/2010), GERD (12/09/2006), GLUCOSE INTOLERANCE (01/24/2009), GOUT (12/09/2006), HYPERCHOLESTEROLEMIA (12/09/2006), HYPERLIPIDEMIA (12/18/2006), HYPERTENSION (12/09/2006), Impaired glucose tolerance (07/26/2010), MENOPAUSAL DISORDER (01/25/2008), and Stroke ([place]).  Encounter for well adult exam with abnormal findings .Age and sex appropriate education and counseling updated with regular exercise and diet Referrals for preventative services - none needed Immunizations addressed - declines covid booster, shingrix, flu shot, tdap Smoking counseling  - none needed Evidence for depression or other mood disorder - none significant Most recent labs reviewed. I have personally reviewed and have noted: 1) the patient's medical and social history 2) The patient's current medications and supplements 3) The patient's height, weight, and BMI have been recorded in the chart   Dementia with behavioral disturbance (HCC) stable overall, ok to continue same tx  History of completed stroke Stable, no new neuro s/s. Continue eliquis, declines statin  Hyperlipidemia Lab Results  Component Value Date   LDLCALC 142 (H) 12/15/2016    Uncontrolled but statin intolerant, pt to continue current low chol diet   Essential hypertension BP Readings from Last 3 Encounters:  09/02/20 (!) 150/76  07/26/20 (!) 149/53  05/30/20 136/79   Mild uncontrolled recently, but daughter states doing well at home, pt to continue medical treatment norvasc, lotensin, declines change for now   Impaired glucose tolerance Lab Results  Component Value Date   HGBA1C 6.4 12/15/2016   Stable, pt to continue current medical treatment  - diet, declines further lab for now   General weakness Also for [facility] referral with RN, PT and aide  Followup: Return in about 6 months (around 10/07/2021), or if symptoms worsen or fail to improve.   I discussed the assessment and treatment plan with the patient. The patient was provided an opportunity to ask questions and all were answered. The patient agreed with the plan and demonstrated an understanding of the instructions.   The patient was advised to call back or seek an in-person evaluation if the symptoms worsen or if the condition fails to improve as anticipated.  [name], MD

## 2021-04-12 NOTE — Assessment & Plan Note (Signed)
Lab Results  Component Value Date   HGBA1C 6.4 12/15/2016   Stable, pt to continue current medical treatment  - diet, declines further lab for now

## 2021-04-12 NOTE — Assessment & Plan Note (Signed)
Also for [facility] referral with RN, PT and aide

## 2021-04-12 NOTE — Assessment & Plan Note (Signed)
.  Age and sex appropriate education and counseling updated with regular exercise and diet Referrals for preventative services - none needed Immunizations addressed - declines covid booster, shingrix, flu shot, tdap Smoking counseling  - none needed Evidence for depression or other mood disorder - none significant Most recent labs reviewed. I have personally reviewed and have noted: 1) the patient's medical and social history 2) The patient's current medications and supplements 3) The patient's height, weight, and BMI have been recorded in the chart

## 2021-04-16 NOTE — Telephone Encounter (Signed)
Romie Minus calling from [facility] is calling to get Verbal orders for [facility] PT x Twice a week /5 weeks then once a week/2weeks.  Romie Minus [phone].

## 2021-04-16 NOTE — Telephone Encounter (Signed)
Ok for verbals 

## 2021-04-17 NOTE — Telephone Encounter (Signed)
Verbals given  

## 2021-04-21 ENCOUNTER — Other Ambulatory Visit: Payer: Self-pay | Admitting: Neurology

## 2021-04-21 DIAGNOSIS — F03B18 Unspecified dementia, moderate, with other behavioral disturbance: Secondary | ICD-10-CM

## 2021-05-07 DIAGNOSIS — Z8673 Personal history of transient ischemic attack (TIA), and cerebral infarction without residual deficits: Secondary | ICD-10-CM | POA: Diagnosis not present

## 2021-05-07 DIAGNOSIS — F0390 Unspecified dementia without behavioral disturbance: Secondary | ICD-10-CM | POA: Diagnosis not present

## 2021-05-07 DIAGNOSIS — M109 Gout, unspecified: Secondary | ICD-10-CM | POA: Diagnosis not present

## 2021-05-07 DIAGNOSIS — E785 Hyperlipidemia, unspecified: Secondary | ICD-10-CM | POA: Diagnosis not present

## 2021-05-11 DIAGNOSIS — M6281 Muscle weakness (generalized): Secondary | ICD-10-CM | POA: Diagnosis not present

## 2021-05-11 DIAGNOSIS — Z8673 Personal history of transient ischemic attack (TIA), and cerebral infarction without residual deficits: Secondary | ICD-10-CM | POA: Diagnosis not present

## 2021-05-11 DIAGNOSIS — I10 Essential (primary) hypertension: Secondary | ICD-10-CM | POA: Diagnosis not present

## 2021-05-11 DIAGNOSIS — K219 Gastro-esophageal reflux disease without esophagitis: Secondary | ICD-10-CM | POA: Diagnosis not present

## 2021-05-11 DIAGNOSIS — M1712 Unilateral primary osteoarthritis, left knee: Secondary | ICD-10-CM | POA: Diagnosis not present

## 2021-05-11 DIAGNOSIS — F03A0 Unspecified dementia, mild, without behavioral disturbance, psychotic disturbance, mood disturbance, and anxiety: Secondary | ICD-10-CM | POA: Diagnosis not present

## 2021-05-11 DIAGNOSIS — M109 Gout, unspecified: Secondary | ICD-10-CM | POA: Diagnosis not present

## 2021-05-11 DIAGNOSIS — E7800 Pure hypercholesterolemia, unspecified: Secondary | ICD-10-CM | POA: Diagnosis not present

## 2021-05-11 DIAGNOSIS — Z79891 Long term (current) use of opiate analgesic: Secondary | ICD-10-CM | POA: Diagnosis not present

## 2021-05-11 DIAGNOSIS — Z7901 Long term (current) use of anticoagulants: Secondary | ICD-10-CM | POA: Diagnosis not present

## 2021-05-11 DIAGNOSIS — M158 Other polyosteoarthritis: Secondary | ICD-10-CM | POA: Diagnosis not present

## 2021-05-11 DIAGNOSIS — R7302 Impaired glucose tolerance (oral): Secondary | ICD-10-CM | POA: Diagnosis not present

## 2021-05-13 ENCOUNTER — Other Ambulatory Visit: Payer: Self-pay | Admitting: Neurology

## 2021-05-14 ENCOUNTER — Other Ambulatory Visit: Payer: Self-pay | Admitting: Internal Medicine

## 2021-05-25 ENCOUNTER — Other Ambulatory Visit: Payer: Self-pay | Admitting: Internal Medicine

## 2021-05-25 NOTE — Telephone Encounter (Signed)
Please refill as per office routine med refill policy (all routine meds to be refilled for 3 mo or monthly (per pt preference) up to one year from last visit, then month to month grace period for 3 mo, then further med refills will have to be denied) ? ?

## 2021-05-27 ENCOUNTER — Telehealth: Payer: Self-pay | Admitting: Internal Medicine

## 2021-05-27 DIAGNOSIS — N183 Chronic kidney disease, stage 3 unspecified: Secondary | ICD-10-CM | POA: Diagnosis not present

## 2021-05-27 DIAGNOSIS — E7800 Pure hypercholesterolemia, unspecified: Secondary | ICD-10-CM | POA: Diagnosis not present

## 2021-05-27 DIAGNOSIS — M8580 Other specified disorders of bone density and structure, unspecified site: Secondary | ICD-10-CM | POA: Diagnosis not present

## 2021-05-27 NOTE — Telephone Encounter (Signed)
[name] requesting [facility] evaluation  Caller requesting a call back w/ provider's verbal authorization  Phone [phone] Heather [name]

## 2021-05-28 NOTE — Telephone Encounter (Signed)
Ok for verbal 

## 2021-05-28 NOTE — Telephone Encounter (Signed)
Verbals given  

## 2021-06-05 DIAGNOSIS — I10 Essential (primary) hypertension: Secondary | ICD-10-CM | POA: Diagnosis not present

## 2021-06-05 DIAGNOSIS — F0390 Unspecified dementia without behavioral disturbance: Secondary | ICD-10-CM | POA: Diagnosis not present

## 2021-06-05 DIAGNOSIS — K219 Gastro-esophageal reflux disease without esophagitis: Secondary | ICD-10-CM | POA: Diagnosis not present

## 2021-06-05 DIAGNOSIS — Z8673 Personal history of transient ischemic attack (TIA), and cerebral infarction without residual deficits: Secondary | ICD-10-CM | POA: Diagnosis not present

## 2021-06-10 DIAGNOSIS — I10 Essential (primary) hypertension: Secondary | ICD-10-CM | POA: Diagnosis not present

## 2021-06-10 DIAGNOSIS — Z7901 Long term (current) use of anticoagulants: Secondary | ICD-10-CM | POA: Diagnosis not present

## 2021-06-10 DIAGNOSIS — R7302 Impaired glucose tolerance (oral): Secondary | ICD-10-CM | POA: Diagnosis not present

## 2021-06-10 DIAGNOSIS — E7800 Pure hypercholesterolemia, unspecified: Secondary | ICD-10-CM | POA: Diagnosis not present

## 2021-06-10 DIAGNOSIS — K219 Gastro-esophageal reflux disease without esophagitis: Secondary | ICD-10-CM | POA: Diagnosis not present

## 2021-06-10 DIAGNOSIS — M1712 Unilateral primary osteoarthritis, left knee: Secondary | ICD-10-CM | POA: Diagnosis not present

## 2021-06-10 DIAGNOSIS — M109 Gout, unspecified: Secondary | ICD-10-CM | POA: Diagnosis not present

## 2021-06-10 DIAGNOSIS — Z8673 Personal history of transient ischemic attack (TIA), and cerebral infarction without residual deficits: Secondary | ICD-10-CM | POA: Diagnosis not present

## 2021-06-10 DIAGNOSIS — Z9181 History of falling: Secondary | ICD-10-CM | POA: Diagnosis not present

## 2021-06-10 DIAGNOSIS — Z79899 Other long term (current) drug therapy: Secondary | ICD-10-CM | POA: Diagnosis not present

## 2021-07-04 DIAGNOSIS — J9611 Chronic respiratory failure with hypoxia: Secondary | ICD-10-CM | POA: Diagnosis not present

## 2021-07-04 DIAGNOSIS — K219 Gastro-esophageal reflux disease without esophagitis: Secondary | ICD-10-CM | POA: Diagnosis not present

## 2021-07-04 DIAGNOSIS — F0390 Unspecified dementia without behavioral disturbance: Secondary | ICD-10-CM | POA: Diagnosis not present

## 2021-07-04 DIAGNOSIS — I10 Essential (primary) hypertension: Secondary | ICD-10-CM | POA: Diagnosis not present

## 2021-07-10 DIAGNOSIS — I10 Essential (primary) hypertension: Secondary | ICD-10-CM | POA: Diagnosis not present

## 2021-07-10 DIAGNOSIS — Z9181 History of falling: Secondary | ICD-10-CM | POA: Diagnosis not present

## 2021-07-10 DIAGNOSIS — Z79899 Other long term (current) drug therapy: Secondary | ICD-10-CM | POA: Diagnosis not present

## 2021-07-10 DIAGNOSIS — M1712 Unilateral primary osteoarthritis, left knee: Secondary | ICD-10-CM | POA: Diagnosis not present

## 2021-07-10 DIAGNOSIS — M109 Gout, unspecified: Secondary | ICD-10-CM | POA: Diagnosis not present

## 2021-07-10 DIAGNOSIS — R7302 Impaired glucose tolerance (oral): Secondary | ICD-10-CM | POA: Diagnosis not present

## 2021-07-10 DIAGNOSIS — E7800 Pure hypercholesterolemia, unspecified: Secondary | ICD-10-CM | POA: Diagnosis not present

## 2021-07-10 DIAGNOSIS — K219 Gastro-esophageal reflux disease without esophagitis: Secondary | ICD-10-CM | POA: Diagnosis not present

## 2021-07-10 DIAGNOSIS — Z7901 Long term (current) use of anticoagulants: Secondary | ICD-10-CM | POA: Diagnosis not present

## 2021-07-10 DIAGNOSIS — Z8673 Personal history of transient ischemic attack (TIA), and cerebral infarction without residual deficits: Secondary | ICD-10-CM | POA: Diagnosis not present

## 2021-07-25 DIAGNOSIS — J309 Allergic rhinitis, unspecified: Secondary | ICD-10-CM | POA: Diagnosis not present

## 2021-07-25 DIAGNOSIS — I5032 Chronic diastolic (congestive) heart failure: Secondary | ICD-10-CM | POA: Diagnosis not present

## 2021-07-25 DIAGNOSIS — R062 Wheezing: Secondary | ICD-10-CM | POA: Diagnosis not present

## 2021-07-25 DIAGNOSIS — J9611 Chronic respiratory failure with hypoxia: Secondary | ICD-10-CM | POA: Diagnosis not present

## 2021-07-30 DIAGNOSIS — J9611 Chronic respiratory failure with hypoxia: Secondary | ICD-10-CM | POA: Diagnosis not present

## 2021-07-30 DIAGNOSIS — I129 Hypertensive chronic kidney disease with stage 1 through stage 4 chronic kidney disease, or unspecified chronic kidney disease: Secondary | ICD-10-CM | POA: Diagnosis not present

## 2021-07-30 DIAGNOSIS — N183 Chronic kidney disease, stage 3 unspecified: Secondary | ICD-10-CM | POA: Diagnosis not present

## 2021-07-30 DIAGNOSIS — K219 Gastro-esophageal reflux disease without esophagitis: Secondary | ICD-10-CM | POA: Diagnosis not present

## 2021-08-15 ENCOUNTER — Encounter: Payer: Self-pay | Admitting: Neurology

## 2021-08-15 ENCOUNTER — Telehealth (INDEPENDENT_AMBULATORY_CARE_PROVIDER_SITE_OTHER): Payer: Medicare Other | Admitting: Neurology

## 2021-08-15 DIAGNOSIS — F03B18 Unspecified dementia, moderate, with other behavioral disturbance: Secondary | ICD-10-CM | POA: Diagnosis not present

## 2021-08-15 MED ORDER — DONEPEZIL HCL 10 MG PO TABS: 10.0000 mg | ORAL_TABLET | Freq: Every day | ORAL | 3 refills | Status: DC

## 2021-08-15 MED ORDER — QUETIAPINE FUMARATE 25 MG PO TABS: 25.0000 mg | ORAL_TABLET | Freq: Every day | ORAL | 3 refills | Status: DC

## 2021-08-15 NOTE — Patient Instructions (Signed)
Good to see you doing well. Continue current medications, refills sent for Donepezil and Seroquel. Continue 24/7 care. Follow-up in 1 year, call for any changes.  ?

## 2021-08-15 NOTE — Progress Notes (Signed)
? ?Virtual Visit via Video Note ?The purpose of this virtual visit is to provide medical care while limiting exposure to the novel coronavirus.   ? ?Consent was obtained for video visit:  Yes.   ?Answered questions that patient had about telehealth interaction:  Yes.   ?I discussed the limitations, risks, security and privacy concerns of performing an evaluation and management service by telemedicine. I also discussed with the patient that there may be a patient responsible charge related to this service. The patient expressed understanding and agreed to proceed. ? ?Pt location: Home ?Physician Location: office ?Name of referring provider:  Biagio Borg, MD ?I connected with Heather [name] at patients initiation/request on 08/15/2021 at 11:30 AM EDT by video enabled telemedicine application and verified that I am speaking with the correct person using two identifiers. ?Pt MRN:  [id number] ?Pt DOB:  [birth year] ?Video Participants:  Heather [name];  Heywood Iles (daughter) ? ? ?History of Present Illness:  ?The patient had a virtual video visit on 08/15/2021. She was last evaluated in the neurology clinic in 03/2020 for dementia with behavioral disturbance. Her daughter [name] provides the history. She is bedbound and getting a pedicure during her visit today. She is in good spirits. [name] reports she has been overall stable from a dementia standpoint since her last visit. She has 24/7 care. She is able to feed herself and appetite is pretty good (patient says "damn well!"). No dysphagia. Family manages medications, finances, meals. She needs a hoyer lift for transfers. Behaviors overall stable, no hallucinations/paranoia, she may get temper tantrums if she does not want to stay in her wheelchair. Sleep is good, she is on Trazodone and Seroquel every night. She is on Donepezil '10mg'$  daily. No side effects on medications. No falls. She was in the ER in 05/2020 after a syncopal episode as she was finishing up breakfast, she  suddenly slumped over for 3-4 minutes then had nausea/vomiting. She was back to baseline on EMS arrival. [name] reported similar spells in the past usually after eating. No convulsive activity seen. She denies any headaches or dizziness. ? ? ? ?History on Initial Assessment 03/09/2016: This is an 88 yo RH woman with a history of hypertension, hyperlipidemia, encephalocele s/p surgery in 2003, with memory loss and stroke in October 2017. She had seen her PCP on 01/01/16 for memory problems. An MRI brain was ordered, however while waiting for the scan, she started having shortness of breath and was admitted 01/24/16 and found to have DVTs in both lower extremities. She was also complaining of generalized weakness with difficulty ambulating. She was discharged home on Xarelto and home PT. She went for outpatient previously scheduled MRI brain on 02/03/16, which I personally reviewed, showing an acute/subacute small left periatrial infarct without associated hemorrhage. There was a remote moderate-sized right parietal lobe infarct. There was marked chronic microvascular change. Remote frontal craniotomy for repair of anterior encephalocele was seen, with significant surrounding encephalomalacia. There was moderate global atrophy without hydrocephalus. She was instructed to go to the ER, but since she had recent echocardiogram and was started on Xarelto, and had no clear deficits on exam, and she was discharged home with outpatient follow-up.  ?  ?She reports that her memory has been "on and off" for conversations. She can hold a conversation then just "slacks off." Her daughter has noticed this and feels that she loses interest, cutting off conversations. She endorses some word-finding difficulties. Her daughter states that sometimes it is hard to  say about her memory because of her "quirky sense of humor." For instance, this morning she said she did not want breakfast. After her daughter came home, she told her she was  hungry, so her daughter fixed food. Once made, she stated she did not want food. She only repeats herself when they are having conversations in the bathroom at 3am. She had previously been living with her husband until hospital admission, and has been living with her daughter for the past 2 months. She denied any missed bill payments when she was living with her husband. She is pretty good with remembering to take her morning medications. She stopped driving due to mobility issues 3 years ago. She has been using a wheelchair since recent hospital discharge, however her daughter reports that for the past 2 years, she has not walked a lot because of bilateral leg weakness. She would use a walker and hold on to furniture. She feels her left arm and leg are weaker. She has occasional tingling in both hands. She needs assistance with dressing and bathing. Her daughter reports a couple of falls in the past 2 years, she would tell her daughter about them 1-2 weeks later. She denies any prior history of stroke. She denies any headaches, dizziness, diplopia, dysarthria/dysphagia. No personality changes or hallucinations. She is having some constipation, but her bigger issue is urinary urgency. She has the urge to urinate 4 to 6 times an hour at night, but once she sits on the commode, she does not urinate. During the day, she has the urge around 3 times an hour.  ?  ? ?  ?Current Outpatient Medications on File Prior to Visit  ?Medication Sig Dispense Refill  ? amLODipine (NORVASC) 5 MG tablet TAKE 1 & 1/2 (ONE & ONE-HALF) TABLETS BY MOUTH ONCE DAILY 135 tablet 1  ? benazepril (LOTENSIN) 40 MG tablet TAKE ONE TABLET BY MOUTH ONCE DAILY 90 tablet 3  ? donepezil (ARICEPT) 10 MG tablet Take 1 tablet by mouth once daily 90 tablet 0  ? EQ ALLERGY RELIEF, CETIRIZINE, 10 MG tablet Take 1 tablet by mouth once daily 90 tablet 3  ? guaiFENesin (MUCINEX) 600 MG 12 hr tablet Take 2 tablets (1,200 mg total) by mouth 2 (two) times daily as  needed. 60 tablet 1  ? montelukast (SINGULAIR) 10 MG tablet Take 1 tablet by mouth once daily 90 tablet 3  ? nystatin (MYCOSTATIN/NYSTOP) powder USE AS DIRECTED TWICE DAILY AS NEEDED FOR ITCHING 45 g 0  ? pantoprazole (PROTONIX) 40 MG tablet Take 1 tablet (40 mg total) by mouth 2 (two) times daily. 90 tablet 0  ? QUEtiapine (SEROQUEL) 25 MG tablet TAKE 1 TABLET BY MOUTH AT BEDTIME 90 tablet 0  ? traZODone (DESYREL) 50 MG tablet TAKE 1/2 TO 1 (ONE-HALF TO ONE) TABLET BY MOUTH AT BEDTIME AS NEEDED FOR SLEEP 90 tablet 1  ? triamcinolone (NASACORT) 55 MCG/ACT AERO nasal inhaler Place 2 sprays into the nose daily. 1 Inhaler 12  ? [DISCONTINUED] apixaban (ELIQUIS) 5 MG TABS tablet Take 1 tablet (5 mg total) by mouth 2 (two) times daily. (Patient not taking: Reported on 04/03/2020) 60 tablet 11  ? [DISCONTINUED] levothyroxine (SYNTHROID, LEVOTHROID) 25 MCG tablet Take 0.5 tablets (12.5 mcg total) by mouth daily before breakfast. (Patient not taking: Reported on 04/03/2020) 30 tablet 0  ? ?No current facility-administered medications on file prior to visit.  ? ? ? ?Observations/Objective:   ?GEN:  The patient appears stated age and is in NAD.  Lying in bed ? ?Neurological examination: Patient is awake, alert, oriented to person. States she is 88 years old, month is "[birth year]" - her birthday. No aphasia or dysarthria. Remote and recent memory impaired. Cranial nerves: Extraocular movements intact with no nystagmus. No facial asymmetry. Motor: moves upper extremities symmetrically, at least anti-gravity. No incoordination on finger to nose testing. Gait: not tested (bedbound) ? ? ?Assessment and Plan:   ?This is a 88 yo RH woman with a history of hypertension, hyperlipidemia, encephalocele s/p surgery in 2003, subcortical stroke in October 2017, with moderate dementia with behavioral disturbance. She requires 24/7 care. Family reports symptoms overall stable, continue current regimen, refills sent for Donepezil '10mg'$  daily and  Seroquel '25mg'$  qhs. Follow-up in 1 year, call for any changes.  ? ? ?Follow Up Instructions: ?  ?-I discussed the assessment and treatment plan with the patient. The patient was provided an opportunity to

## 2021-08-25 ENCOUNTER — Other Ambulatory Visit: Payer: Self-pay | Admitting: Internal Medicine

## 2021-08-29 DIAGNOSIS — F0390 Unspecified dementia without behavioral disturbance: Secondary | ICD-10-CM | POA: Diagnosis not present

## 2021-08-29 DIAGNOSIS — K219 Gastro-esophageal reflux disease without esophagitis: Secondary | ICD-10-CM | POA: Diagnosis not present

## 2021-08-29 DIAGNOSIS — J9611 Chronic respiratory failure with hypoxia: Secondary | ICD-10-CM | POA: Diagnosis not present

## 2021-08-29 DIAGNOSIS — I10 Essential (primary) hypertension: Secondary | ICD-10-CM | POA: Diagnosis not present

## 2021-09-05 ENCOUNTER — Other Ambulatory Visit: Payer: Self-pay

## 2021-09-05 ENCOUNTER — Observation Stay (HOSPITAL_COMMUNITY)
Admission: EM | Admit: 2021-09-05 | Discharge: 2021-09-06 | Disposition: A | Discharge status: Home Health | Payer: Medicare Other | Attending: Internal Medicine | Admitting: Internal Medicine

## 2021-09-05 ENCOUNTER — Emergency Department (HOSPITAL_COMMUNITY): Payer: Medicare Other

## 2021-09-05 ENCOUNTER — Encounter (HOSPITAL_COMMUNITY): Payer: Self-pay

## 2021-09-05 ENCOUNTER — Inpatient Hospital Stay (HOSPITAL_COMMUNITY): Payer: Medicare Other

## 2021-09-05 DIAGNOSIS — I10 Essential (primary) hypertension: Secondary | ICD-10-CM | POA: Diagnosis present

## 2021-09-05 DIAGNOSIS — E876 Hypokalemia: Secondary | ICD-10-CM | POA: Diagnosis not present

## 2021-09-05 DIAGNOSIS — R2990 Unspecified symptoms and signs involving the nervous system: Secondary | ICD-10-CM | POA: Diagnosis not present

## 2021-09-05 DIAGNOSIS — R000 Tachycardia, unspecified: Secondary | ICD-10-CM | POA: Diagnosis not present

## 2021-09-05 DIAGNOSIS — G9341 Metabolic encephalopathy: Secondary | ICD-10-CM | POA: Diagnosis not present

## 2021-09-05 DIAGNOSIS — R569 Unspecified convulsions: Secondary | ICD-10-CM | POA: Diagnosis not present

## 2021-09-05 DIAGNOSIS — R29818 Other symptoms and signs involving the nervous system: Principal | ICD-10-CM | POA: Insufficient documentation

## 2021-09-05 DIAGNOSIS — G319 Degenerative disease of nervous system, unspecified: Secondary | ICD-10-CM | POA: Diagnosis not present

## 2021-09-05 DIAGNOSIS — Z86711 Personal history of pulmonary embolism: Secondary | ICD-10-CM | POA: Diagnosis not present

## 2021-09-05 DIAGNOSIS — Z20822 Contact with and (suspected) exposure to covid-19: Secondary | ICD-10-CM | POA: Diagnosis not present

## 2021-09-05 DIAGNOSIS — F03A0 Unspecified dementia, mild, without behavioral disturbance, psychotic disturbance, mood disturbance, and anxiety: Secondary | ICD-10-CM | POA: Diagnosis present

## 2021-09-05 DIAGNOSIS — Z7901 Long term (current) use of anticoagulants: Secondary | ICD-10-CM | POA: Insufficient documentation

## 2021-09-05 DIAGNOSIS — R7302 Impaired glucose tolerance (oral): Secondary | ICD-10-CM | POA: Diagnosis present

## 2021-09-05 DIAGNOSIS — Z79899 Other long term (current) drug therapy: Secondary | ICD-10-CM | POA: Diagnosis not present

## 2021-09-05 DIAGNOSIS — Z7189 Other specified counseling: Secondary | ICD-10-CM

## 2021-09-05 DIAGNOSIS — R404 Transient alteration of awareness: Secondary | ICD-10-CM | POA: Diagnosis not present

## 2021-09-05 DIAGNOSIS — Z86718 Personal history of other venous thrombosis and embolism: Secondary | ICD-10-CM | POA: Diagnosis not present

## 2021-09-05 DIAGNOSIS — G40901 Epilepsy, unspecified, not intractable, with status epilepticus: Secondary | ICD-10-CM | POA: Insufficient documentation

## 2021-09-05 DIAGNOSIS — R410 Disorientation, unspecified: Secondary | ICD-10-CM | POA: Diagnosis not present

## 2021-09-05 DIAGNOSIS — J329 Chronic sinusitis, unspecified: Secondary | ICD-10-CM | POA: Diagnosis not present

## 2021-09-05 DIAGNOSIS — R4182 Altered mental status, unspecified: Secondary | ICD-10-CM | POA: Diagnosis not present

## 2021-09-05 DIAGNOSIS — Z8673 Personal history of transient ischemic attack (TIA), and cerebral infarction without residual deficits: Secondary | ICD-10-CM | POA: Diagnosis not present

## 2021-09-05 DIAGNOSIS — R4020 Unspecified coma: Secondary | ICD-10-CM | POA: Diagnosis not present

## 2021-09-05 DIAGNOSIS — I6782 Cerebral ischemia: Secondary | ICD-10-CM | POA: Diagnosis not present

## 2021-09-05 DIAGNOSIS — R29810 Facial weakness: Secondary | ICD-10-CM | POA: Diagnosis not present

## 2021-09-05 DIAGNOSIS — F03918 Unspecified dementia, unspecified severity, with other behavioral disturbance: Secondary | ICD-10-CM | POA: Diagnosis not present

## 2021-09-05 LAB — COMPREHENSIVE METABOLIC PANEL
ALT: 14 U/L (ref 0–44)
AST: 25 U/L (ref 15–41)
Albumin: 2.9 g/dL — ABNORMAL LOW (ref 3.5–5.0)
Alkaline Phosphatase: 71 U/L (ref 38–126)
Anion gap: 9 (ref 5–15)
BUN: 10 mg/dL (ref 8–23)
CO2: 29 mmol/L (ref 22–32)
Calcium: 8.6 mg/dL — ABNORMAL LOW (ref 8.9–10.3)
Chloride: 96 mmol/L — ABNORMAL LOW (ref 98–111)
Creatinine, Ser: 0.76 mg/dL (ref 0.44–1.00)
GFR, Estimated: >60 mL/min (ref >60)
Glucose, Bld: 157 mg/dL — ABNORMAL HIGH (ref 70–99)
Potassium: 2.9 mmol/L — ABNORMAL LOW (ref 3.5–5.1)
Sodium: 134 mmol/L — ABNORMAL LOW (ref 135–145)
Total Bilirubin: 0.9 mg/dL (ref 0.3–1.2)
Total Protein: 6.8 g/dL (ref 6.5–8.1)

## 2021-09-05 LAB — RESP PANEL BY RT-PCR (FLU A&B, COVID) ARPGX2
Influenza A by PCR: NEGATIVE
Influenza B by PCR: NEGATIVE
SARS Coronavirus 2 by RT PCR: NEGATIVE

## 2021-09-05 LAB — CBC
HCT: 42.3 % (ref 36.0–46.0)
Hemoglobin: 13.2 g/dL (ref 12.0–15.0)
MCH: 27.5 pg (ref 26.0–34.0)
MCHC: 31.2 g/dL (ref 30.0–36.0)
MCV: 88.1 fL (ref 80.0–100.0)
Platelets: 319 10*3/uL (ref 150–400)
RBC: 4.8 MIL/uL (ref 3.87–5.11)
RDW: 16.8 % — ABNORMAL HIGH (ref 11.5–15.5)
WBC: 4.7 10*3/uL (ref 4.0–10.5)
nRBC: 0 % (ref 0.0–0.2)

## 2021-09-05 LAB — DIFFERENTIAL
Abs Immature Granulocytes: 0.01 10*3/uL (ref 0.00–0.07)
Basophils Absolute: 0.1 10*3/uL (ref 0.0–0.1)
Basophils Relative: 1 %
Eosinophils Absolute: 0.1 10*3/uL (ref 0.0–0.5)
Eosinophils Relative: 3 %
Immature Granulocytes: 0 %
Lymphocytes Relative: 42 %
Lymphs Abs: 2 10*3/uL (ref 0.7–4.0)
Monocytes Absolute: 0.7 10*3/uL (ref 0.1–1.0)
Monocytes Relative: 14 %
Neutro Abs: 1.9 10*3/uL (ref 1.7–7.7)
Neutrophils Relative %: 40 %

## 2021-09-05 LAB — PROTIME-INR
INR: 1.6 — ABNORMAL HIGH (ref 0.8–1.2)
Prothrombin Time: 18.4 seconds — ABNORMAL HIGH (ref 11.4–15.2)

## 2021-09-05 LAB — APTT: aPTT: 37 seconds — ABNORMAL HIGH (ref 24–36)

## 2021-09-05 LAB — ETHANOL: Alcohol, Ethyl (B): <10 mg/dL (ref <10)

## 2021-09-05 MED ORDER — SENNOSIDES-DOCUSATE SODIUM 8.6-50 MG PO TABS: 1.0000 | ORAL_TABLET | Freq: Every evening | ORAL | Status: DC | PRN

## 2021-09-05 MED ORDER — ACETAMINOPHEN 650 MG RE SUPP: 650.0000 mg | RECTAL | Status: DC | PRN

## 2021-09-05 MED ORDER — RIVAROXABAN 10 MG PO TABS: 10.0000 mg | ORAL_TABLET | Freq: Every day | ORAL | Status: DC

## 2021-09-05 MED ORDER — ACETAMINOPHEN 160 MG/5ML PO SOLN: 650.0000 mg | ORAL | Status: DC | PRN

## 2021-09-05 MED ORDER — QUETIAPINE FUMARATE 25 MG PO TABS
25.0000 mg | ORAL_TABLET | Freq: Every day | ORAL | Status: DC
  Administered 2021-09-05: 25 mg via ORAL
  Filled 2021-09-05: qty 1

## 2021-09-05 MED ORDER — LORAZEPAM 2 MG/ML IJ SOLN
1.0000 mg | Freq: Once | INTRAMUSCULAR | Status: AC
  Administered 2021-09-05: 1 mg via INTRAVENOUS

## 2021-09-05 MED ORDER — LORAZEPAM 2 MG/ML IJ SOLN: 4.0000 mg | INTRAMUSCULAR | Status: DC | PRN

## 2021-09-05 MED ORDER — LEVETIRACETAM IN NACL 1000 MG/100ML IV SOLN
1000.0000 mg | INTRAVENOUS | Status: AC
  Administered 2021-09-05: 1000 mg via INTRAVENOUS
  Filled 2021-09-05: qty 100

## 2021-09-05 MED ORDER — STROKE: EARLY STAGES OF RECOVERY BOOK
Freq: Once | Status: AC
  Filled 2021-09-05 (×2): qty 1

## 2021-09-05 MED ORDER — LORAZEPAM 2 MG/ML IJ SOLN
INTRAMUSCULAR | Status: AC
Start: 2021-09-05 — End: 2021-09-05
  Filled 2021-09-05: qty 1

## 2021-09-05 MED ORDER — PANTOPRAZOLE SODIUM 40 MG PO TBEC
40.0000 mg | DELAYED_RELEASE_TABLET | Freq: Every day | ORAL | Status: DC
  Administered 2021-09-06: 40 mg via ORAL
  Filled 2021-09-05: qty 1

## 2021-09-05 MED ORDER — ACETAMINOPHEN 325 MG PO TABS: 650.0000 mg | ORAL_TABLET | ORAL | Status: DC | PRN

## 2021-09-05 MED ORDER — ASPIRIN 325 MG PO TABS
325.0000 mg | ORAL_TABLET | Freq: Every day | ORAL | Status: DC
  Administered 2021-09-05 – 2021-09-06 (×2): 325 mg via ORAL
  Filled 2021-09-05 (×2): qty 1

## 2021-09-05 MED ORDER — DONEPEZIL HCL 10 MG PO TABS
10.0000 mg | ORAL_TABLET | Freq: Every day | ORAL | Status: DC
  Administered 2021-09-06: 10 mg via ORAL
  Filled 2021-09-05: qty 1

## 2021-09-05 MED ORDER — TRAZODONE HCL 50 MG PO TABS: 25.0000 mg | ORAL_TABLET | Freq: Every evening | ORAL | Status: DC | PRN

## 2021-09-05 MED ORDER — MONTELUKAST SODIUM 10 MG PO TABS
10.0000 mg | ORAL_TABLET | Freq: Every day | ORAL | Status: DC
  Administered 2021-09-05: 10 mg via ORAL
  Filled 2021-09-05: qty 1

## 2021-09-05 MED ORDER — ASPIRIN 300 MG RE SUPP: 300.0000 mg | Freq: Every day | RECTAL | Status: DC

## 2021-09-05 NOTE — Progress Notes (Signed)
EEG completed, results pending. 

## 2021-09-05 NOTE — Consult Note (Addendum)
Neurology Consultation  Reason for Consult: Code stroke Referring Physician: Dr. Roderic Palau  CC: Unresponsiveness  History is obtained from: Patient's daughter, chart  HPI: Heather [name] is a 88 y.o. female history of dementia, hypertension, PE on Xarelto, prior craniotomy for sinus infection, hyperlipidemia, prior strokes, essentially bedbound at baseline with a modified Rankin of 4-5 presented to the emergency room for evaluation of diminished responsiveness/unresponsiveness. According to the family, last seen normal at 9:50 AM when she was eating breakfast-at baseline able to feed herself but no other ADLs independently. Daughter went outside for a little while and when she came back she found the patient unresponsive.  EMS was called they found her to have a left-sided gaze and inability to follow commands. Brought in as an acute code stroke. On ER evaluation, remained unresponsive with leftward gaze preference-see detailed exam below. Noncontrast head CT negative for acute process. Strong suspicion for stroke versus seizure.  No intervention indicated due to being on anticoagulation and not a candidate for EVT due to poor functional baseline  Stat EEG-right-sided PLEDs.  LKW: 9:50 AM tpa given?: no, Xarelto Premorbid modified Rankin scale (mRS): 5  WIO:XBDZHG to obtain due to altered mental status.   Past Medical History:  Diagnosis Date   ALLERGIC RHINITIS 12/09/2006   DEGENERATIVE JOINT DISEASE, LEFT KNEE 01/23/2007   Encephalocele ([place]) 01/23/2007   FEVER UNSPECIFIED 01/28/2010   GERD 12/09/2006   GLUCOSE INTOLERANCE 01/24/2009   GOUT 12/09/2006   HYPERCHOLESTEROLEMIA 12/09/2006   HYPERLIPIDEMIA 12/18/2006   HYPERTENSION 12/09/2006   Impaired glucose tolerance 07/26/2010   MENOPAUSAL DISORDER 01/25/2008   Stroke [facility])     Family History  Problem Relation Age of Onset   Hypertension Other     Social History:   reports that she has never smoked. She has never used smokeless  tobacco. She reports that she does not drink alcohol and does not use drugs.  Medications No current facility-administered medications for this encounter.  Current Outpatient Medications:    amLODipine (NORVASC) 5 MG tablet, TAKE 1 & 1/2 (ONE & ONE-HALF) TABLETS BY MOUTH ONCE DAILY (Patient taking differently: Take 7.5 mg by mouth daily.), Disp: 135 tablet, Rfl: 1   Dextromethorphan-guaiFENesin (MUCINEX DM) 30-600 MG TB12, Take 1 tablet by mouth every 12 (twelve) hours as needed for cough or congestion., Disp: , Rfl:    donepezil (ARICEPT) 10 MG tablet, Take 1 tablet (10 mg total) by mouth daily., Disp: 90 tablet, Rfl: 3   EQ ALLERGY RELIEF, CETIRIZINE, 10 MG tablet, Take 1 tablet by mouth once daily (Patient taking differently: Take 10 mg by mouth daily.), Disp: 90 tablet, Rfl: 3   loperamide (IMODIUM) 2 MG capsule, Take 2 mg by mouth daily as needed for diarrhea or loose stools., Disp: , Rfl:    montelukast (SINGULAIR) 10 MG tablet, Take 1 tablet by mouth once daily (Patient taking differently: Take 10 mg by mouth at bedtime.), Disp: 90 tablet, Rfl: 3   pantoprazole (PROTONIX) 40 MG tablet, Take 1 tablet (40 mg total) by mouth 2 (two) times daily. (Patient taking differently: Take 1 tablet by mouth daily.), Disp: 90 tablet, Rfl: 0   QUEtiapine (SEROQUEL) 25 MG tablet, Take 1 tablet (25 mg total) by mouth at bedtime., Disp: 90 tablet, Rfl: 3   traZODone (DESYREL) 50 MG tablet, TAKE 1/2 TO 1 (ONE-HALF TO ONE) TABLET BY MOUTH AT BEDTIME AS NEEDED FOR SLEEP (Patient taking differently: Take 25-50 mg by mouth at bedtime as needed for sleep.), Disp: 90 tablet,  Rfl: 1   XARELTO 10 MG TABS tablet, Take 10 mg by mouth at bedtime., Disp: , Rfl:    benazepril (LOTENSIN) 40 MG tablet, TAKE ONE TABLET BY MOUTH ONCE DAILY (Patient not taking: Reported on 09/05/2021), Disp: 90 tablet, Rfl: 3  Exam: Current vital signs: BP (!) 153/68   Ht 5' (1.524 m)   Wt 75 kg   BMI 32.29 kg/m  Vital signs in last 24  hours: BP: (153)/(68) 153/68 (05/26 1119) Weight:  [75 kg] 75 kg (05/26 1147) GEN: Eyes open with leftward gaze deviation, does not follow commands HEENT: Normocephalic atraumatic, edentulous Lungs: Clear Cardiovascular: Regular rhythm Neurologic exam She is laying in bed with eyes open and gaze deviated to the left.  She does not look to the right. She does not blink to threat from either side She does not follow any commands Completely nonverbal Cranial nerves: Pupils are equal round reactive to light, gaze is deviated to the left, no blink to threat, difficult to ascertain facial asymmetry due to edentulousness. Motor examination with no movement in any of the 4 extremities. Sensation: No grimace or withdrawal to noxious stimulation in all fours Coordination cannot be assessed due to mentation NIH stroke scale-29  Labs I have reviewed labs in epic and the results pertinent to this consultation are:   CBC    Component Value Date/Time   WBC 4.7 09/05/2021 1132   RBC 4.80 09/05/2021 1132   HGB 13.2 09/05/2021 1132   HCT 42.3 09/05/2021 1132   PLT 319 09/05/2021 1132   MCV 88.1 09/05/2021 1132   MCH 27.5 09/05/2021 1132   MCHC 31.2 09/05/2021 1132   RDW 16.8 (H) 09/05/2021 1132   LYMPHSABS 2.0 09/05/2021 1132   MONOABS 0.7 09/05/2021 1132   EOSABS 0.1 09/05/2021 1132   BASOSABS 0.1 09/05/2021 1132    CMP     Component Value Date/Time   NA 134 (L) 09/05/2021 1132   K 2.9 (L) 09/05/2021 1132   CL 96 (L) 09/05/2021 1132   CO2 29 09/05/2021 1132   GLUCOSE 157 (H) 09/05/2021 1132   BUN 10 09/05/2021 1132   CREATININE 0.76 09/05/2021 1132   CALCIUM 8.6 (L) 09/05/2021 1132   PROT 6.8 09/05/2021 1132   ALBUMIN 2.9 (L) 09/05/2021 1132   AST 25 09/05/2021 1132   ALT 14 09/05/2021 1132   ALKPHOS 71 09/05/2021 1132   BILITOT 0.9 09/05/2021 1132   GFRNONAA >60 09/05/2021 1132   GFRAA >60 08/10/2017 1719    Lipid Panel     Component Value Date/Time   CHOL 212 (H)  12/15/2016 1129   TRIG 189.0 (H) 12/15/2016 1129   TRIG 81 04/14/2006 0857   HDL 31.80 (L) 12/15/2016 1129   CHOLHDL 7 12/15/2016 1129   VLDL 37.8 12/15/2016 1129   LDLCALC 142 (H) 12/15/2016 1129   LDLDIRECT 139.0 01/01/2016 0958     Imaging I have reviewed the images obtained:  CT-head: No acute changes.  Stable noncontrast CT appearance compared to February 2022.  Foci of chronic encephalomalacia/gliosis within the anterior inferior frontal lobes bilaterally with a moderate size chronic right MCA infarct within the right parietal lobe.  Background moderate advanced cerebral small vessel disease. Multiple other known small chronic infarcts.  Neurodiagnostics EEG: ABNORMALITY - Lateralized periodic discharges with overriding fast activity ( LPD +F) right hemisphere - Continuous slow, generalized and lateralized right hemisphere IMPRESSION: This study showed evidence of epileptogenicity and cortical dysfunction arising from right hemisphere likely due to underlying structural  abnormality. This eeg pattern is on the ictal-interictal continuum with higher potential for seizure recurrence. Additionally there is moderate diffuse encephalopathy, nonspecific etiology. No definite seizures were seen throughout the recording.  Assessment:  88 year old with above past medical history with sudden onset of left foot deviation and unresponsiveness.  Gaze deviation started on route to the hospital-initial complaint was unresponsiveness. Given her multiple prior strokes and history of dementia, stroke versus seizure top of the differentials. Her examination was more consistent with a seizure with strong alteration of awareness and consciousness. Did not pursue vessel imaging as she is not a candidate for endovascular intervention. Being on Xarelto, she is not a candidate for IV thrombolysis. Stat EEG was done-reveals right-sided PLEDs. Given IV Ativan and a load of Keppra after which the gaze  deviation has resolved. Patient is a DNR.  I would be cautious giving her more sedating medications. We will continue to follow with LTM EEG.  Impression: Seizures, status epilepticus Rule out stroke  Recommendations: Continuous EEG Continue Keppra 500 twice daily Maintain seizure precautions MRI of the brain after EEG is disconnected Check UA, chest x-ray Hold sedating medications as much as possible. Plan discussed with Dr. Roderic Palau. Neurology to continue to follow  -- Amie Portland, MD Neurologist [facility] Pager: [phone]   Admire Performed by: Amie Portland, MD Total critical care time: 44 minutes Critical care time was exclusive of separately billable procedures and treating other patients and/or supervising APPs/Residents/Students Critical care was necessary to treat or prevent imminent or life-threatening deterioration due to evaluation of seizures versus stroke symptoms This patient is critically ill and at significant risk for neurological worsening and/or death and care requires constant monitoring. Critical care was time spent personally by me on the following activities: development of treatment plan with patient and/or surrogate as well as nursing, discussions with consultants, evaluation of patient's response to treatment, examination of patient, obtaining history from patient or surrogate, ordering and performing treatments and interventions, ordering and review of laboratory studies, ordering and review of radiographic studies, pulse oximetry, re-evaluation of patient's condition, participation in multidisciplinary rounds and medical decision making of high complexity in the care of this patient.

## 2021-09-05 NOTE — ED Triage Notes (Signed)
Pt coming from home via EMS. Pt lives at home with her daughter who is her caregiver. Pt eating breakfast at 950 this morning. Daughter stepped outside for a few minutes and found pt unresponsive. Pt has left sided gaze, unable to follow commands and nonverbal. Pt at baseline is bed bound with contractures of upper extremities, is normally able to communicate but has some confusion.   Bp 198/102 HR 96 96% RA CBG 140

## 2021-09-05 NOTE — Progress Notes (Signed)
Clinical/Bedside Swallow Evaluation Patient Details  Name: Heather [name] MRN: [id number] Date of Birth: 12/28/1928  Today's Date: 09/05/2021 Time: SLP Start Time (ACUTE ONLY): 1791 SLP Stop Time (ACUTE ONLY): 1445 SLP Time Calculation (min) (ACUTE ONLY): 17 min  Past Medical History:  Past Medical History:  Diagnosis Date   ALLERGIC RHINITIS 12/09/2006   DEGENERATIVE JOINT DISEASE, LEFT KNEE 01/23/2007   Encephalocele ([place]) 01/23/2007   FEVER UNSPECIFIED 01/28/2010   GERD 12/09/2006   GLUCOSE INTOLERANCE 01/24/2009   GOUT 12/09/2006   HYPERCHOLESTEROLEMIA 12/09/2006   HYPERLIPIDEMIA 12/18/2006   HYPERTENSION 12/09/2006   Impaired glucose tolerance 07/26/2010   MENOPAUSAL DISORDER 01/25/2008   Stroke [facility])    Past Surgical History:  Past Surgical History:  Procedure Laterality Date   BIOPSY  07/25/2020   Procedure: BIOPSY;  Surgeon: Jerene Bears, MD;  Location: [facility];  Service: Endoscopy;;   COLONOSCOPY WITH PROPOFOL N/A 07/25/2020   Procedure: COLONOSCOPY WITH PROPOFOL;  Surgeon: Jerene Bears, MD;  Location: [facility]OSCOPY;  Service: Endoscopy;  Laterality: N/A;   POLYPECTOMY  07/25/2020   Procedure: POLYPECTOMY;  Surgeon: Jerene Bears, MD;  Location: [facility]OSCOPY;  Service: Endoscopy;;   s/p anterior encephalocele repair and craniotomy  2003   HPI:  Heather [name] is a 88 y.o. female arriving to [facility]  with past medical hx significant of hyperlipidemia, hypertension, and stroke 07/2020. Admitted after being found unable to speak, fixed gaze. CT no evidence of acute abnormality, stable compared to 05/30/2020, foci of chronic encephalomalacia/gliosis withint anteroinferior lobes bilaterally, chronic right MCA infarct,, small chronic infarcts within left cerebellar hemisphere. No ST notes found.    Assessment / Plan / Recommendation  Clinical Impression  Pt seen for swallow evaluation with family at bedside who deny prior difficulty consuming po's. She is endentulous and daughter  makes sure her food is soft at home. Presently, pt's processing is slow but does respond with additional time and follows commands. No significant difficulty moving lips or tongue just general diminished ROM. Pt self fed spoonfuls applesauce wtih oral holding and delayed transit noted. Her mastication with solid was slow but without residue. Readily consumed straw sips water single and multiple with delayed oral propulsion and appeared to have delayed swallow initiation. Able to clear oral cavity. Recommend Dys 2 texture and daughter in agreement, thin liquids, straws, crush meds, full supervision and check oral cavity. ST will continue to follow. SLP Visit Diagnosis: Dysphagia, oropharyngeal phase (R13.12)    Aspiration Risk  Mild aspiration risk    Diet Recommendation Dysphagia 2 (Fine chop);Thin liquid   Liquid Administration via: Cup;Straw Medication Administration: Crushed with puree Supervision: Staff to assist with self feeding Compensations: Slow rate;Small sips/bites;Minimize environmental distractions;Lingual sweep for clearance of pocketing Postural Changes: Seated upright at 90 degrees    Other  Recommendations Oral Care Recommendations: Oral care BID    Recommendations for follow up therapy are one component of a multi-disciplinary discharge planning process, led by the attending physician.  Recommendations may be updated based on patient status, additional functional criteria and insurance authorization.  Follow up Recommendations  (TBD)      Assistance Recommended at Discharge Frequent or constant Supervision/Assistance  Functional Status Assessment Patient has had a recent decline in their functional status and demonstrates the ability to make significant improvements in function in a reasonable and predictable amount of time.  Frequency and Duration min 2x/week  2 weeks       Prognosis Prognosis for Safe Diet Advancement:  (  fair-good) Barriers to Reach Goals: Cognitive  deficits      Swallow Study   General Date of Onset: 09/05/21 HPI: Heather [name] is a 88 y.o. female arriving to [facility]  with past medical hx significant of hyperlipidemia, hypertension, and stroke 07/2020. Admitted after being found unable to speak, fixed gaze. CT no evidence of acute abnormality, stable compared to 05/30/2020, foci of chronic encephalomalacia/gliosis withint anteroinferior lobes bilaterally, chronic right MCA infarct,, small chronic infarcts within left cerebellar hemisphere. No ST notes found. Type of Study: Bedside Swallow Evaluation Previous Swallow Assessment: none Diet Prior to this Study: NPO Respiratory Status: Room air History of Recent Intubation: No Behavior/Cognition: Cooperative;Pleasant mood;Other (Comment);Requires cueing (awake) Oral Cavity Assessment: Within Functional Limits Oral Care Completed by SLP: No Oral Cavity - Dentition: Edentulous Vision:  (uncertain of visual ability) Self-Feeding Abilities: Able to feed self Patient Positioning: Upright in bed Baseline Vocal Quality: Low vocal intensity Volitional Cough: Weak    Oral/Motor/Sensory Function Overall Oral Motor/Sensory Function:  (slow movements)   Ice Chips Ice chips: Not tested   Thin Liquid Thin Liquid: Impaired Presentation: Straw Oral Phase Functional Implications: Prolonged oral transit Pharyngeal  Phase Impairments: Suspected delayed Swallow    Nectar Thick Nectar Thick Liquid: Not tested   Honey Thick Honey Thick Liquid: Not tested   Puree Puree: Impaired Oral Phase Functional Implications: Prolonged oral transit   Solid     Solid: Within functional limits      Houston Siren 09/05/2021,3:24 PM

## 2021-09-05 NOTE — Code Documentation (Signed)
Stroke Response Nurse Documentation Code Documentation  Heather [name] is a 88 y.o. female arriving to [facility]  via [facility] on 5/26 with past medical hx significant of hyperlipidemia, hypertension, and stroke 07/2020. On Xarelto (rivaroxaban) daily.    Patient from home where she is cared for by her family. She was LKW at 0950 while eating breakfast. Daughter stepped out and upon return noted she was unable to speak. ED RN noted fixed gaze and activated code stroke.   Stroke team at the bedside following notification. Patient to CT with team. NIHSS 29, see documentation for details and code stroke times. Patient with disoriented, not following commands, left gaze preference , bilateral arm weakness, bilateral leg weakness, bilateral decreased sensation, Global aphasia , dysarthria , and Sensory  neglect on exam. The following imaging was completed:  CT Head. Patient is not a candidate for IV Thrombolytic due to on anticoagulant. Patient is not a candidate for IR due to elevated modified rankin score.   Care Plan: EEG; keppra; Q1 assessments.   Bedside handoff with ED RN [facility].    Earma Reading  Stroke Response RN

## 2021-09-05 NOTE — Progress Notes (Signed)
LTM EEG hooked up and running - no initial skin breakdown - push button tested - neuro notified.  

## 2021-09-05 NOTE — Procedures (Signed)
Patient Name: Heather [name]  MRN: [id number]  Epilepsy Attending: Lora Havens  Referring Physician/Provider: Amie Portland, MD Date: 09/05/2021 Duration: 21.29 mins  Patient history: 88yo F with sudden onset inability to speak and left gaze deviation. EEG to evaluate for seizure  Level of alertness:  lethargic   AEDs during EEG study: LEV  Technical aspects: This EEG study was done with scalp electrodes positioned according to the 10-20 International system of electrode placement. Electrical activity was acquired at a sampling rate of '500Hz'$  and reviewed with a high frequency filter of '70Hz'$  and a low frequency filter of '1Hz'$ . EEG data were recorded continuously and digitally stored.   Description: EEG showed continuous generalized and lateralized right hemisphere 3 to 6 Hz theta-delta slowing admixed with 13-'15Hz'$  generalized beta activity.  Lateralized periodic discharges with overriding fast activity ( LPD+F) at  1-1.'5Hz'$  was noted in right hemisphere  Hyperventilation and photic stimulation were not performed.     ABNORMALITY - Lateralized periodic discharges with overriding fast activity ( LPD +F) right hemisphere - Continuous slow, generalized and lateralized right hemisphere  IMPRESSION: This study showed evidence of epileptogenicity and cortical dysfunction arising from right hemisphere likely due to underlying structural abnormality. This eeg pattern is on the ictal-interictal continuum with higher potential for seizure recurrence. Additionally there is moderate diffuse encephalopathy, nonspecific etiology. No definite seizures were seen throughout the recording.  Heather [name] was notified  Heather [name] Heather [name]

## 2021-09-05 NOTE — ED Notes (Signed)
Code stroke paged on pt  

## 2021-09-05 NOTE — H&P (Signed)
History and Physical    Patient: Heather [name] [id number]:[id number] DOB: 1929-01-22 DOA: 09/05/2021 DOS: the patient was seen and examined on 09/05/2021 PCP: Biagio Borg, MD  Patient coming from: Home - lives with daughter and her husband (has 7 daughters!); NOK: Daughter, Heywood Iles, [phone]   Chief Complaint: Code stroke  HPI: Heather [name] is a 88 y.o. female with medical history significant of HTN; HLD; CVA; dementia; PE on Xarelto; and impaired glucose tolerance presenting with code stroke. The patient has significant debility at baseline - she gets OOB for meals BID with a Hoyer lift and otherwise is bedbound and requires almost total care.  She is conversant with a sense of humor that masks her cognitive impairment.  She had her 89st birthday party last weekend and has been excessively tired since so sleeping more and interacting less.  Last night, she was even more somnolent and today was unarousable.  She was not interacting at all and so family called 48.  In the ER, there was concern for left gaze deviation.  At the time of my evaluation, the patient awoke enough to provide her name, reported that she was at "home" and followed some commands.    ER Course:  Daughter found unresponsive.  Leftward gaze in ER.  Neuro has seen.  Maybe seizure. Given Keppra.   Would like to admit to progressive.     Review of Systems: unable to review all systems due to the inability of the patient to answer questions. Past Medical History:  Diagnosis Date   ALLERGIC RHINITIS 12/09/2006   DEGENERATIVE JOINT DISEASE, LEFT KNEE 01/23/2007   Encephalocele ([place]) 01/23/2007   FEVER UNSPECIFIED 01/28/2010   GERD 12/09/2006   GLUCOSE INTOLERANCE 01/24/2009   GOUT 12/09/2006   HYPERCHOLESTEROLEMIA 12/09/2006   HYPERLIPIDEMIA 12/18/2006   HYPERTENSION 12/09/2006   Impaired glucose tolerance 07/26/2010   MENOPAUSAL DISORDER 01/25/2008   Stroke [facility])    Past Surgical History:  Procedure Laterality Date    BIOPSY  07/25/2020   Procedure: BIOPSY;  Surgeon: Jerene Bears, MD;  Location: [facility] ENDOSCOPY;  Service: Endoscopy;;   COLONOSCOPY WITH PROPOFOL N/A 07/25/2020   Procedure: COLONOSCOPY WITH PROPOFOL;  Surgeon: Jerene Bears, MD;  Location: [facility] ENDOSCOPY;  Service: Endoscopy;  Laterality: N/A;   POLYPECTOMY  07/25/2020   Procedure: POLYPECTOMY;  Surgeon: Jerene Bears, MD;  Location: [facility] ENDOSCOPY;  Service: Endoscopy;;   s/p anterior encephalocele repair and craniotomy  2003   Social History:  reports that she has never smoked. She has never used smokeless tobacco. She reports that she does not drink alcohol and does not use drugs.  Allergies  Allergen Reactions   Lipitor [Atorvastatin] Other (See Comments)    myalgias   Codeine Other (See Comments)    unknown   Lovastatin Other (See Comments)    myalgias   Pravastatin Sodium Other (See Comments)    myalgias    Family History  Problem Relation Age of Onset   Hypertension Other     Prior to Admission medications   Medication Sig Start Date End Date Taking? Authorizing Provider  amLODipine (NORVASC) 5 MG tablet TAKE 1 & 1/2 (ONE & ONE-HALF) TABLETS BY MOUTH ONCE DAILY Patient taking differently: Take 7.5 mg by mouth daily. 01/15/21  Yes Biagio Borg, MD  Dextromethorphan-guaiFENesin [facility] DM) 30-600 MG TB12 Take 1 tablet by mouth every 12 (twelve) hours as needed for cough or congestion.   Yes [provider]  donepezil (ARICEPT) 10  MG tablet Take 1 tablet (10 mg total) by mouth daily. 08/15/21  Yes Cameron Sprang, MD  EQ ALLERGY RELIEF, CETIRIZINE, 10 MG tablet Take 1 tablet by mouth once daily Patient taking differently: Take 10 mg by mouth daily. 08/26/20  Yes Biagio Borg, MD  loperamide (IMODIUM) 2 MG capsule Take 2 mg by mouth daily as needed for diarrhea or loose stools.   Yes [provider]  montelukast (SINGULAIR) 10 MG tablet Take 1 tablet by mouth once daily Patient taking differently: Take 10 mg by mouth  at bedtime. 05/26/21  Yes Biagio Borg, MD  pantoprazole (PROTONIX) 40 MG tablet Take 1 tablet (40 mg total) by mouth 2 (two) times daily. Patient taking differently: Take 1 tablet by mouth daily. 07/26/20  Yes Spongberg, Audie Pinto, MD  QUEtiapine (SEROQUEL) 25 MG tablet Take 1 tablet (25 mg total) by mouth at bedtime. 08/15/21  Yes Cameron Sprang, MD  traZODone (DESYREL) 50 MG tablet TAKE 1/2 TO 1 (ONE-HALF TO ONE) TABLET BY MOUTH AT BEDTIME AS NEEDED FOR SLEEP Patient taking differently: Take 25-50 mg by mouth at bedtime as needed for sleep. 02/27/21  Yes Biagio Borg, MD  XARELTO 10 MG TABS tablet Take 10 mg by mouth at bedtime. 09/01/21  Yes [provider]  benazepril (LOTENSIN) 40 MG tablet TAKE ONE TABLET BY MOUTH ONCE DAILY Patient not taking: Reported on 09/05/2021 02/01/17   Biagio Borg, MD  apixaban (ELIQUIS) 5 MG TABS tablet Take 1 tablet (5 mg total) by mouth 2 (two) times daily. Patient not taking: Reported on 04/03/2020 11/29/19 07/22/20  Biagio Borg, MD  levothyroxine (SYNTHROID, LEVOTHROID) 25 MCG tablet Take 0.5 tablets (12.5 mcg total) by mouth daily before breakfast. Patient not taking: Reported on 04/03/2020 06/28/17 07/22/20  Velvet Bathe, MD    Physical Exam: Vitals:   09/05/21 1300 09/05/21 1315 09/05/21 1630 09/05/21 1725  BP: (!) 151/68 (!) 170/74 (!) 168/91 (!) 169/66  Pulse:  71 (!) 139 68  Resp: (!) 23 19 (!) 22 (!) 23  Temp:   98.8 F (37.1 C) (!) 97.3 F (36.3 C)  TempSrc:   Oral Oral  SpO2:  97% (!) 73% 91%  Weight:      Height:       General:  Appears calm and comfortable and is in NAD, continuous EEG in place Eyes:  EOMI, normal lids, iris ENT:  grossly normal lips & tongue, mmm Neck:  no LAD, masses or thyromegaly Cardiovascular:  RRR, no m/r/g. No LE edema.  Respiratory:   CTA bilaterally with no wheezes/rales/rhonchi.  Normal respiratory effort. Abdomen:  soft, NT, ND Skin:  no rash or induration seen on limited  exam Musculoskeletal:  grossly normal tone BUE/BLE, good ROM, no bony abnormality Lower extremity:  No LE edema.  Limited foot exam with no ulcerations.  2+ distal pulses. Psychiatric:  blunted mood and affect, speech sparse but appropriate, AOx1 Neurologic:  CN 2-12 grossly intact, moves all extremities in coordinated fashion with limited exam   Radiological Exams on Admission: Independently reviewed - see discussion in A/P where applicable  DG CHEST PORT 1 VIEW  Result Date: 09/05/2021 CLINICAL DATA:  Altered mental status EXAM: PORTABLE CHEST 1 VIEW COMPARISON:  Previous studies including the examination of 05/30/2020 FINDINGS: Transverse diameter of heart is increased. Large hiatal hernia is seen in the retrocardiac region. There is increased density in the left lower lung fields. There is marked elevation of right hemidiaphragm. There are  no signs of alveolar pulmonary edema. Evaluation of lower lung fields for infiltrates is limited by large hiatal hernia and elevation of right hemidiaphragm. There is no significant pleural effusion or pneumothorax. IMPRESSION: Cardiomegaly. Increased density in the left lower lung fields most likely is due to fluid-filled stomach. Possibility of new infiltrate in the left lower lung fields is not excluded. Electronically Signed   By: Elmer Picker M.D.   On: 09/05/2021 15:31   EEG adult  Result Date: 09/05/2021 Lora Havens, MD     09/05/2021 12:38 PM Patient Name: SAMARRA RIDGELY MRN: [id number] Epilepsy Attending: Lora Havens Referring Physician/Provider: Amie Portland, MD Date: 09/05/2021 Duration: Patient history: 88yo F with sudden onset inability to speak and left gaze deviation. EEG to evaluate for seizure Level of alertness:  lethargic AEDs during EEG study: LEV Technical aspects: This EEG study was done with scalp electrodes positioned according to the 10-20 International system of electrode placement. Electrical activity was acquired at a  sampling rate of '500Hz'$  and reviewed with a high frequency filter of '70Hz'$  and a low frequency filter of '1Hz'$ . EEG data were recorded continuously and digitally stored. Description: EEG showed continuous generalized and lateralized right hemisphere 3 to 6 Hz theta-delta slowing admixed with 13-'15Hz'$  generalized beta activity. Lateralized periodic discharges with overriding fast activity ( LPD+F) at  1-1.'5Hz'$  was noted in right hemisphere Hyperventilation and photic stimulation were not performed.   ABNORMALITY - Lateralized periodic discharges with overriding fast activity ( LPD +F) right hemisphere - Continuous slow, generalized and lateralized right hemisphere IMPRESSION: This study showed evidence of epileptogenicity and cortical dysfunction arising from right hemisphere likely due to underlying structural abnormality. This eeg pattern is on the ictal-interictal continuum with higher potential for seizure recurrence. Additionally there is moderate diffuse encephalopathy, nonspecific etiology. No definite seizures were seen throughout the recording. Janyce Llanos was notified Lora Havens   CT HEAD CODE STROKE WO CONTRAST  Result Date: 09/05/2021 CLINICAL DATA:  Code stroke. Neuro deficit, acute, stroke suspected. Left-sided gaze. EXAM: CT HEAD WITHOUT CONTRAST TECHNIQUE: Contiguous axial images were obtained from the base of the skull through the vertex without intravenous contrast. RADIATION DOSE REDUCTION: This exam was performed according to the departmental dose-optimization program which includes automated exposure control, adjustment of the mA and/or kV according to patient size and/or use of iterative reconstruction technique. COMPARISON:  Prior head CT examinations 05/30/2020 and earlier. Brain MRI 01/25/2017. FINDINGS: Brain: Mild generalized cerebral atrophy. Redemonstrated foci of chronic encephalomalacia/gliosis within the anteroinferior frontal lobes, bilaterally. As before, there is irregular  hyperdense and partially calcified material along the anteroinferior right frontal lobe, which may reflect sequela of the reported prior skull base cephalocele repair. Redemonstrated moderate-sized chronic cortical/subcortical infarct within the right parietal lobe. Background moderate to advanced patchy and ill-defined hypoattenuation within the cerebral white matter, nonspecific but compatible with chronic small vessel ischemic disease. Known small chronic infarcts within the left cerebellar hemisphere, better appreciated on the prior brain MRI of 01/25/2017. There is no acute intracranial hemorrhage. No acute demarcated cortical infarct. No extra-axial fluid collection. No evidence of an intracranial mass. No midline shift. Vascular: No hyperdense vessel.  Atherosclerotic calcifications. Skull: Right frontoparietal cranioplasty. No fracture or aggressive osseous lesion. Sinuses/Orbits: No mass or acute finding within the imaged orbits. Postsurgical appearance of the paranasal sinuses. Small mucous retention cyst within the right maxillary sinus. Mild mucosal thickening within the right sphenoid sinus with associated chronic reactive osteitis. No significant mastoid effusion. ASPECTS [facility] Stroke  Program Early CT Score) - Ganglionic level infarction (caudate, lentiform nuclei, internal capsule, insula, M1-M3 cortex): 7 - Supraganglionic infarction (M4-M6 cortex): 3 Total score (0-10 with 10 being normal): 10 (when discounting chronic infarcts). These results were communicated to Dr. Rory Percy at 11:56 amon 5/26/2023by text page via the [facility] messaging system. IMPRESSION: 1. No evidence of acute intracranial abnormality. 2. Stable non-contrast CT appearance of the brain as compared to 05/30/2020. 3. Foci of chronic encephalomalacia/gliosis within the anteroinferior frontal lobes, bilaterally. 4. Moderate-sized chronic right MCA territory infarct within the right parietal lobe. 5. Background moderate/advanced  cerebral white matter chronic small vessel ischemic disease. 6. Known small chronic infarcts within the left cerebellar hemisphere 7. Mild generalized cerebral atrophy. 8. Right frontoparietal cranioplasty. 9. Paranasal sinus disease, as described. Electronically Signed   By: Kellie Simmering D.O.   On: 09/05/2021 11:58    EKG: Independently reviewed.  NSR with rate 95; prolonged QTc 515; nonspecific ST changes with no evidence of acute ischemia   Labs on Admission: I have personally reviewed the available labs and imaging studies at the time of the admission.  Pertinent labs:    K+ 2.9 Glucose 157 Albumin 2.9 Unremarkable CBC INR 1.6 COVID/flu negative ETOH <10   Assessment and Plan: Principal Problem:   Stroke-like symptoms Active Problems:   Personal history of DVT (deep vein thrombosis)   Essential hypertension   Impaired glucose tolerance   Dementia with behavioral disturbance (HCC)   Do not resuscitate discussion   History of CVA (cerebrovascular accident)   Goals of care, counseling/discussion    Stroke like symptoms -Patient with h/o CVA and dementia presenting with depressed mental status and concern for CVA vs. Seizure -Aspirin has been given to reduce stroke mortality and decrease morbidity -Will admit to progressive care for further evaluation -Telemetry monitoring -Neurology consult -PT/OT/ST/Nutrition Consults -She has been loaded with Keppra and is undergoing LTM EEG -Seizure precautions -Ativan prn  -Neurology recommends an MRI after completion of LTM EEG  HTN -Allow permissive HTN for now -Treat BP only if >220/120, and then with goal of 15% reduction -Hold amlodipine and plan to restart in 48-72 hours   Impaired glucose tolerance -Not on medications -It is unlikely that she will need acute or chronic treatment for this issue   Dementia -Continue Aricept, trazodone, and Seroquel -Will order delirium precautions  H/o PE -Continue Xarelto  Goals  of care -GOC discussion held with daughter -Primary objective is for comfort and to keep the patient at home -They are ok with medications (Keppra) and monitoring in the hospital for now but with limited interventions -She is DNR    Advance Care Planning:   Code Status: DNR   Consults: Neurology; nutrition; TOC team; PT/OT  DVT Prophylaxis: Xarelto  Family Communication: Daughter was present throughout evaluation, grandson was there for part of discussion  Severity of Illness: The appropriate patient status for this patient is INPATIENT. Inpatient status is judged to be reasonable and necessary in order to provide the required intensity of service to ensure the patient's safety. The patient's presenting symptoms, physical exam findings, and initial radiographic and laboratory data in the context of their chronic comorbidities is felt to place them at high risk for further clinical deterioration. Furthermore, it is not anticipated that the patient will be medically stable for discharge from the hospital within 2 midnights of admission.   * I certify that at the point of admission it is my clinical judgment that the patient will require inpatient  hospital care spanning beyond 2 midnights from the point of admission due to high intensity of service, high risk for further deterioration and high frequency of surveillance required.*  Author: Karmen Bongo, MD 09/05/2021 5:32 PM  For on call review [email or web address].

## 2021-09-06 DIAGNOSIS — R569 Unspecified convulsions: Secondary | ICD-10-CM | POA: Diagnosis not present

## 2021-09-06 DIAGNOSIS — R2990 Unspecified symptoms and signs involving the nervous system: Secondary | ICD-10-CM | POA: Diagnosis not present

## 2021-09-06 LAB — BASIC METABOLIC PANEL
Anion gap: 9 (ref 5–15)
BUN: 10 mg/dL (ref 8–23)
CO2: 32 mmol/L (ref 22–32)
Calcium: 8.6 mg/dL — ABNORMAL LOW (ref 8.9–10.3)
Chloride: 100 mmol/L (ref 98–111)
Creatinine, Ser: 0.85 mg/dL (ref 0.44–1.00)
GFR, Estimated: >60 mL/min (ref >60)
Glucose, Bld: 93 mg/dL (ref 70–99)
Potassium: 3.2 mmol/L — ABNORMAL LOW (ref 3.5–5.1)
Sodium: 141 mmol/L (ref 135–145)

## 2021-09-06 LAB — MAGNESIUM: Magnesium: 2 mg/dL (ref 1.7–2.4)

## 2021-09-06 MED ORDER — POTASSIUM CHLORIDE 20 MEQ PO PACK
20.0000 meq | PACK | Freq: Once | ORAL | Status: AC
  Administered 2021-09-06: 20 meq via ORAL
  Filled 2021-09-06: qty 1

## 2021-09-06 MED ORDER — LEVETIRACETAM 500 MG PO TABS: 500.0000 mg | ORAL_TABLET | Freq: Two times a day (BID) | ORAL | 0 refills | Status: DC

## 2021-09-06 MED ORDER — LORAZEPAM 2 MG/ML IJ SOLN: 1.0000 mg | INTRAMUSCULAR | Status: DC | PRN

## 2021-09-06 MED ORDER — SODIUM CHLORIDE 0.9 % IV SOLN
750.0000 mg | Freq: Two times a day (BID) | INTRAVENOUS | Status: DC
  Administered 2021-09-06: 750 mg via INTRAVENOUS
  Filled 2021-09-06 (×3): qty 7.5

## 2021-09-06 MED ORDER — LEVETIRACETAM IN NACL 500 MG/100ML IV SOLN
500.0000 mg | Freq: Two times a day (BID) | INTRAVENOUS | Status: DC
  Filled 2021-09-06: qty 100

## 2021-09-06 MED ORDER — POTASSIUM CHLORIDE 20 MEQ PO PACK
40.0000 meq | PACK | Freq: Once | ORAL | Status: AC
  Administered 2021-09-06: 40 meq via ORAL
  Filled 2021-09-06: qty 2

## 2021-09-06 NOTE — Discharge Summary (Addendum)
Heather [name] RCB:[id number] DOB: 03-25-1929 DOA: 09/05/2021  PCP: Biagio Borg, MD  Admit date: 09/05/2021  Discharge date: 09/06/2021  Admitted From: Home   Disposition:  Home   Recommendations for Outpatient Follow-up:   Follow up with PCP in 1-2 weeks  PCP Please obtain BMP/CBC, 2 view CXR in 1week,  (see Discharge instructions)   PCP Please follow up on the following pending results: Check BMP in 4 to 5 days, needs close outpatient neurology follow-up   Home Health: PT, RN if she qualifies Equipment/Devices: None  Consultations: Neurology Discharge Condition: Stable    CODE STATUS: DNR   Diet Recommendation: Dysphagia 2 diet with feeding assistance and aspiration precautions   Chief Complaint  Patient presents with   Altered Mental Status     Brief history of present illness from the day of admission and additional interim summary    Heather [name] is a 88 y.o. female with medical history significant of HTN; HLD; CVA; dementia; PE on Xarelto; and impaired glucose tolerance presenting with symptoms of decreased mental status, she was seen by neurology, initial EEG showed changes suggestive of right-sided PLEDs.                                                                 Hospital Course   Decreased mental status ? seizure.  EEG with right-sided PLEDs.  Increased propensity for epileptogenicity but no positive seizures, case discussed in detail with Dr. Lynnae Sandhoff neurologist on-call, most likely patient's presentation does point towards low-grade seizures, with Keppra she is much improved.  Of note she is already on Xarelto and getting an MRI will not change the management because at best she could have small lacunar infarcts.  Her Xarelto will be continued as before, she currently has no focal  deficits headache or neck stiffness.  She is now close to her baseline will be discharged on home Keppra with outpatient PCP and neurology follow-up.  Plan discussed with family who are in agreement.  Goal of care here is medical management and to avoid heroics.  Focus on keeping her comfortable and happy.    HTN stable.  Continue home medications     Impaired glucose tolerance -Not on medications -It is unlikely that she will need acute or chronic treatment for this issue    Dementia -Continue Aricept, trazodone, and Seroquel -Will order delirium precautions   H/o PE -Continue Xarelto  Hypokalemia.  Replaced.  PCP to recheck.  Chronic hypoxic respiratory failure requiring home oxygen.  Uses intermittently borrowing it from other family members, here on room air pulse ox between 85 and 80%, upon application of 2 L nasal cannula oxygen saturating at 92%.   Discharge diagnosis     Principal Problem:   Stroke-like symptoms Active Problems:  Personal history of DVT (deep vein thrombosis)   Essential hypertension   Impaired glucose tolerance   Dementia with behavioral disturbance (HCC)   Do not resuscitate discussion   History of CVA (cerebrovascular accident)   Goals of care, counseling/discussion    Discharge instructions    Discharge Instructions     Discharge instructions   Complete by: As directed    Follow with Primary MD Biagio Borg, MD in 7 days   Get CBC, CMP, 2 view Chest X ray -  checked next visit within 1 week by Primary MD    Activity: As tolerated with Full fall precautions use walker/cane & assistance as needed  Disposition Home   Diet: Dysphagia 2 with feeding assistance and aspiration precautions.   Special Instructions: If you have smoked or chewed Tobacco  in the last 2 yrs please stop smoking, stop any regular Alcohol  and or any Recreational drug use.  On your next visit with your primary care physician please Get Medicines reviewed and  adjusted.  Please request your Prim.MD to go over all Hospital Tests and Procedure/Radiological results at the follow up, please get all Hospital records sent to your Prim MD by signing hospital release before you go home.  If you experience worsening of your admission symptoms, develop shortness of breath, life threatening emergency, suicidal or homicidal thoughts you must seek medical attention immediately by calling 911 or calling your MD immediately  if symptoms less severe.  You Must read complete instructions/literature along with all the possible adverse reactions/side effects for all the Medicines you take and that have been prescribed to you. Take any new Medicines after you have completely understood and accpet all the possible adverse reactions/side effects.   Increase activity slowly   Complete by: As directed        Discharge Medications   Allergies as of 09/06/2021       Reactions   Lipitor [atorvastatin] Other (See Comments)   myalgias   Codeine Other (See Comments)   unknown   Lovastatin Other (See Comments)   myalgias   Pravastatin Sodium Other (See Comments)   myalgias        Medication List     TAKE these medications    amLODipine 5 MG tablet Commonly known as: NORVASC TAKE 1 & 1/2 (ONE & ONE-HALF) TABLETS BY MOUTH ONCE DAILY What changed:  how much to take how to take this when to take this additional instructions   donepezil 10 MG tablet Commonly known as: ARICEPT Take 1 tablet (10 mg total) by mouth daily.   EQ Allergy Relief (Cetirizine) 10 MG tablet Generic drug: cetirizine Take 1 tablet by mouth once daily What changed: how much to take   levETIRAcetam 500 MG tablet Commonly known as: Keppra Take 1 tablet (500 mg total) by mouth 2 (two) times daily.   loperamide 2 MG capsule Commonly known as: IMODIUM Take 2 mg by mouth daily as needed for diarrhea or loose stools.   montelukast 10 MG tablet Commonly known as: SINGULAIR Take 1  tablet by mouth once daily What changed: when to take this   Mucinex DM 30-600 MG Tb12 Take 1 tablet by mouth every 12 (twelve) hours as needed for cough or congestion.   pantoprazole 40 MG tablet Commonly known as: PROTONIX Take 1 tablet (40 mg total) by mouth 2 (two) times daily. What changed: when to take this   QUEtiapine 25 MG tablet Commonly known as: SEROQUEL Take 1 tablet (25  mg total) by mouth at bedtime.   traZODone 50 MG tablet Commonly known as: DESYREL TAKE 1/2 TO 1 (ONE-HALF TO ONE) TABLET BY MOUTH AT BEDTIME AS NEEDED FOR SLEEP What changed: See the new instructions.   Xarelto 10 MG Tabs tablet Generic drug: rivaroxaban Take 10 mg by mouth at bedtime.         Follow-up Information     Biagio Borg, MD. Schedule an appointment as soon as possible for a visit in 1 week(s).   Specialties: Internal Medicine, Radiology Why: Also follow-up with your neurologist within a week. Contact information: [place] [address] [phone]         Cameron Sprang, MD. Schedule an appointment as soon as possible for a visit in 1 week(s).   Specialty: Neurology Contact information: [place] [address] [phone]                 Major procedures and Radiology Reports - PLEASE review detailed and final reports thoroughly  -        DG CHEST PORT 1 VIEW  Result Date: 09/05/2021 CLINICAL DATA:  Altered mental status EXAM: PORTABLE CHEST 1 VIEW COMPARISON:  Previous studies including the examination of 05/30/2020 FINDINGS: Transverse diameter of heart is increased. Large hiatal hernia is seen in the retrocardiac region. There is increased density in the left lower lung fields. There is marked elevation of right hemidiaphragm. There are no signs of alveolar pulmonary edema. Evaluation of lower lung fields for infiltrates is limited by large hiatal hernia and elevation of right hemidiaphragm. There is no  significant pleural effusion or pneumothorax. IMPRESSION: Cardiomegaly. Increased density in the left lower lung fields most likely is due to fluid-filled stomach. Possibility of new infiltrate in the left lower lung fields is not excluded. Electronically Signed   By: Elmer Picker M.D.   On: 09/05/2021 15:31   EEG adult  Result Date: 09/05/2021 Lora Havens, MD     09/06/2021  7:48 AM Patient Name: Heather [name] MRN: [id number] Epilepsy Attending: Lora Havens Referring Physician/Provider: Amie Portland, MD Date: 09/05/2021 Duration: 21.29 mins Patient history: 88yo F with sudden onset inability to speak and left gaze deviation. EEG to evaluate for seizure Level of alertness:  lethargic AEDs during EEG study: LEV Technical aspects: This EEG study was done with scalp electrodes positioned according to the 10-20 International system of electrode placement. Electrical activity was acquired at a sampling rate of '500Hz'$  and reviewed with a high frequency filter of '70Hz'$  and a low frequency filter of '1Hz'$ . EEG data were recorded continuously and digitally stored. Description: EEG showed continuous generalized and lateralized right hemisphere 3 to 6 Hz theta-delta slowing admixed with 13-'15Hz'$  generalized beta activity. Lateralized periodic discharges with overriding fast activity ( LPD+F) at  1-1.'5Hz'$  was noted in right hemisphere Hyperventilation and photic stimulation were not performed.   ABNORMALITY - Lateralized periodic discharges with overriding fast activity ( LPD +F) right hemisphere - Continuous slow, generalized and lateralized right hemisphere IMPRESSION: This study showed evidence of epileptogenicity and cortical dysfunction arising from right hemisphere likely due to underlying structural abnormality. This eeg pattern is on the ictal-interictal continuum with higher potential for seizure recurrence. Additionally there is moderate diffuse encephalopathy, nonspecific etiology. No definite seizures  were seen throughout the recording. Janyce Llanos was notified Lora Havens   Overnight EEG with video  Result Date: 09/06/2021 Lora Havens, MD  09/06/2021  8:01 AM MRN: [id number] Epilepsy Attending: Lora Havens Referring Physician/Provider: Lora Havens Duration: 09/05/2021 1241 to 09/03/2021 0800  Patient history: 88yo F with sudden onset inability to speak and left gaze deviation. EEG to evaluate for seizure  Level of alertness:  lethargic  AEDs during EEG study: LEV  Technical aspects: This EEG study was done with scalp electrodes positioned according to the 10-20 International system of electrode placement. Electrical activity was acquired at a sampling rate of '500Hz'$  and reviewed with a high frequency filter of '70Hz'$  and a low frequency filter of '1Hz'$ . EEG data were recorded continuously and digitally stored.  Description: EEG showed continuous generalized and lateralized right hemisphere 3 to 6 Hz theta-delta slowing admixed with 13-'15Hz'$  generalized beta activity.  Lateralized periodic discharges with overriding fast activity ( LPD+F) at 1-1.'5Hz'$  was noted in right hemisphere. These appeared rhythmic with evolution in frequency and morphology and at times involved left  frontal region, lasting 3-9 seconds consistent with brief ictal--interictal discharges ( BIRDS). After BIRDs, eeg attenuation was noted at times.  Hyperventilation and photic stimulation were not performed.    ABNORMALITY - Brief ictal--interictal discharges ( BIRDS), right hemisphere - Lateralized periodic discharges with overriding fast activity ( LPD +F) right hemisphere - Continuous slow, generalized and lateralized right hemisphere  IMPRESSION: This study showed evidence of epileptogenicity and cortical dysfunction arising from right hemisphere likely due to underlying structural abnormality. Brief ictal-interictal rhythmic discharges( BIRDs) are on the ictal-interictal continuum. However, the frequency and attenuation after  BIRDs was more concerning for ictal nature. Additionally there is moderate diffuse encephalopathy, nonspecific etiology.  Dr Theda Sers was notified  Lora Havens   CT HEAD CODE STROKE WO CONTRAST  Result Date: 09/05/2021 CLINICAL DATA:  Code stroke. Neuro deficit, acute, stroke suspected. Left-sided gaze. EXAM: CT HEAD WITHOUT CONTRAST TECHNIQUE: Contiguous axial images were obtained from the base of the skull through the vertex without intravenous contrast. RADIATION DOSE REDUCTION: This exam was performed according to the departmental dose-optimization program which includes automated exposure control, adjustment of the mA and/or kV according to patient size and/or use of iterative reconstruction technique. COMPARISON:  Prior head CT examinations 05/30/2020 and earlier. Brain MRI 01/25/2017. FINDINGS: Brain: Mild generalized cerebral atrophy. Redemonstrated foci of chronic encephalomalacia/gliosis within the anteroinferior frontal lobes, bilaterally. As before, there is irregular hyperdense and partially calcified material along the anteroinferior right frontal lobe, which may reflect sequela of the reported prior skull base cephalocele repair. Redemonstrated moderate-sized chronic cortical/subcortical infarct within the right parietal lobe. Background moderate to advanced patchy and ill-defined hypoattenuation within the cerebral white matter, nonspecific but compatible with chronic small vessel ischemic disease. Known small chronic infarcts within the left cerebellar hemisphere, better appreciated on the prior brain MRI of 01/25/2017. There is no acute intracranial hemorrhage. No acute demarcated cortical infarct. No extra-axial fluid collection. No evidence of an intracranial mass. No midline shift. Vascular: No hyperdense vessel.  Atherosclerotic calcifications. Skull: Right frontoparietal cranioplasty. No fracture or aggressive osseous lesion. Sinuses/Orbits: No mass or acute finding within the imaged  orbits. Postsurgical appearance of the paranasal sinuses. Small mucous retention cyst within the right maxillary sinus. Mild mucosal thickening within the right sphenoid sinus with associated chronic reactive osteitis. No significant mastoid effusion. ASPECTS [facility] Stroke Program Early CT Score) - Ganglionic level infarction (caudate, lentiform nuclei, internal capsule, insula, M1-M3 cortex): 7 - Supraganglionic infarction (M4-M6 cortex): 3 Total score (0-10 with 10 being normal): 10 (when discounting chronic infarcts). These results were communicated  to Dr. Rory Percy at 11:56 amon 5/26/2023by text page via the [facility] messaging system. IMPRESSION: 1. No evidence of acute intracranial abnormality. 2. Stable non-contrast CT appearance of the brain as compared to 05/30/2020. 3. Foci of chronic encephalomalacia/gliosis within the anteroinferior frontal lobes, bilaterally. 4. Moderate-sized chronic right MCA territory infarct within the right parietal lobe. 5. Background moderate/advanced cerebral white matter chronic small vessel ischemic disease. 6. Known small chronic infarcts within the left cerebellar hemisphere 7. Mild generalized cerebral atrophy. 8. Right frontoparietal cranioplasty. 9. Paranasal sinus disease, as described. Electronically Signed   By: Kellie Simmering D.O.   On: 09/05/2021 11:58    Today   Subjective    Heather [name] today has no headache,no chest abdominal pain,no new weakness tingling or numbness, feels much better    Objective   Blood pressure (!) 173/72, pulse (!) 56, temperature 97.7 F (36.5 C), temperature source Oral, resp. rate 17, height 5' (1.524 m), weight 75 kg, SpO2 90 %.   Intake/Output Summary (Last 24 hours) at 09/06/2021 1146 Last data filed at 09/06/2021 1119 Gross per 24 hour  Intake 340 ml  Output 1000 ml  Net -660 ml    Exam  Awake, pleasantly confused, No new F.N deficits,    Winter.AT,PERRAL Supple Neck,   Symmetrical Chest wall movement, Good air movement  bilaterally, CTAB RRR,No Gallops,   +ve B.Sounds, Abd Soft, Non tender,  No Cyanosis, Clubbing or edema    Data Review   Recent Labs  Lab 09/05/21 1132  WBC 4.7  HGB 13.2  HCT 42.3  PLT 319  MCV 88.1  MCH 27.5  MCHC 31.2  RDW 16.8*  LYMPHSABS 2.0  MONOABS 0.7  EOSABS 0.1  BASOSABS 0.1    Recent Labs  Lab 09/05/21 1132 09/06/21 1000  NA 134* 141  K 2.9* 3.2*  CL 96* 100  CO2 29 32  GLUCOSE 157* 93  BUN 10 10  CREATININE 0.76 0.85  CALCIUM 8.6* 8.6*  AST 25  --   ALT 14  --   ALKPHOS 71  --   BILITOT 0.9  --   ALBUMIN 2.9*  --   MG  --  2.0  INR 1.6*  --       Total Time in preparing paper work, data evaluation and todays exam - 35 minutes  Lala Lund M.D on 09/06/2021 at 11:46 AM  [facility]

## 2021-09-06 NOTE — Progress Notes (Signed)
Pt alert and in no acute distress upon discharge. [facility] here to transport pt home. Pt's son in law awaiting at home for pt's arrival. Pt's belongings taken by her daughter to take home for pt.

## 2021-09-06 NOTE — Progress Notes (Signed)
Maint. Complete. Skin check complete. No break down.

## 2021-09-06 NOTE — Care Management CC44 (Signed)
Condition Code 44 Documentation Completed  Patient Details  Name: Heather [name] MRN: [id number] Date of Birth: 10/30/1928   Condition Code 44 given:  Yes Patient signature on Condition Code 44 notice:  Yes Documentation of 2 MD's agreement:  Yes Code 44 added to claim:  Yes    Carles Collet, RN 09/06/2021, 11:56 AM

## 2021-09-06 NOTE — ED Provider Notes (Signed)
[facility] Provider Note   CSN: [id number] Arrival date & time: 09/05/21  1113     History  Chief Complaint  Patient presents with   Altered Mental Status    Heather [name] is a 88 y.o. female.  Patient with a history of dementia and GERD and hypertension.  She was found by her family today altered.  Patient has history of CVA and is on Xarelto  The history is provided by a relative. No language interpreter was used.  Altered Mental Status Presenting symptoms: behavior changes   Severity:  Severe Most recent episode:  Today Episode history:  Continuous Timing:  Constant Progression:  Waxing and waning Chronicity:  New Context: dementia   Associated symptoms: no abdominal pain       Home Medications Prior to Admission medications   Medication Sig Start Date End Date Taking? Authorizing Provider  amLODipine (NORVASC) 5 MG tablet TAKE 1 & 1/2 (ONE & ONE-HALF) TABLETS BY MOUTH ONCE DAILY Patient taking differently: Take 7.5 mg by mouth daily. 01/15/21  Yes Biagio Borg, MD  Dextromethorphan-guaiFENesin [facility] DM) 30-600 MG TB12 Take 1 tablet by mouth every 12 (twelve) hours as needed for cough or congestion.   Yes [provider]  donepezil (ARICEPT) 10 MG tablet Take 1 tablet (10 mg total) by mouth daily. 08/15/21  Yes Cameron Sprang, MD  EQ ALLERGY RELIEF, CETIRIZINE, 10 MG tablet Take 1 tablet by mouth once daily Patient taking differently: Take 10 mg by mouth daily. 08/26/20  Yes Biagio Borg, MD  loperamide (IMODIUM) 2 MG capsule Take 2 mg by mouth daily as needed for diarrhea or loose stools.   Yes [provider]  montelukast (SINGULAIR) 10 MG tablet Take 1 tablet by mouth once daily Patient taking differently: Take 10 mg by mouth at bedtime. 05/26/21  Yes Biagio Borg, MD  pantoprazole (PROTONIX) 40 MG tablet Take 1 tablet (40 mg total) by mouth 2 (two) times daily. Patient taking differently: Take 1 tablet by mouth daily.  07/26/20  Yes Spongberg, Audie Pinto, MD  QUEtiapine (SEROQUEL) 25 MG tablet Take 1 tablet (25 mg total) by mouth at bedtime. 08/15/21  Yes Cameron Sprang, MD  traZODone (DESYREL) 50 MG tablet TAKE 1/2 TO 1 (ONE-HALF TO ONE) TABLET BY MOUTH AT BEDTIME AS NEEDED FOR SLEEP Patient taking differently: Take 25-50 mg by mouth at bedtime as needed for sleep. 02/27/21  Yes Biagio Borg, MD  XARELTO 10 MG TABS tablet Take 10 mg by mouth at bedtime. 09/01/21  Yes [provider]  apixaban (ELIQUIS) 5 MG TABS tablet Take 1 tablet (5 mg total) by mouth 2 (two) times daily. Patient not taking: Reported on 04/03/2020 11/29/19 07/22/20  Biagio Borg, MD  levothyroxine (SYNTHROID, LEVOTHROID) 25 MCG tablet Take 0.5 tablets (12.5 mcg total) by mouth daily before breakfast. Patient not taking: Reported on 04/03/2020 06/28/17 07/22/20  Velvet Bathe, MD      Allergies    Lipitor [atorvastatin], Codeine, Lovastatin, and Pravastatin sodium    Review of Systems   Review of Systems  Unable to perform ROS: Mental status change  Gastrointestinal:  Negative for abdominal pain.   Physical Exam Updated Vital Signs BP (!) 173/72 (BP Location: Left Arm)   Pulse (!) 56   Temp 97.7 F (36.5 C) (Oral)   Resp 17   Ht 5' (1.524 m)   Wt 75 kg   SpO2 90%   BMI 32.29 kg/m  Physical Exam Vitals and nursing note reviewed.  Constitutional:      Appearance: She is well-developed.     Comments: Unresponsive  HENT:     Head: Normocephalic.     Nose: Nose normal.  Eyes:     General: No scleral icterus.    Conjunctiva/sclera: Conjunctivae normal.  Neck:     Thyroid: No thyromegaly.  Cardiovascular:     Rate and Rhythm: Normal rate and regular rhythm.     Heart sounds: No murmur heard.   No friction rub. No gallop.  Pulmonary:     Breath sounds: No stridor. No wheezing or rales.  Chest:     Chest wall: No tenderness.  Abdominal:     General: There is no distension.     Tenderness: There is no abdominal  tenderness. There is no rebound.  Musculoskeletal:        General: Normal range of motion.     Cervical back: Neck supple.  Lymphadenopathy:     Cervical: No cervical adenopathy.  Skin:    Findings: No erythema or rash.  Neurological:     Motor: No abnormal muscle tone.     Coordination: Coordination normal.     Comments: Patient gazing to the left not responding to verbal or painful stimuli.  Appears to be having a seizure    ED Results / Procedures / Treatments   Labs (all labs ordered are listed, but only abnormal results are displayed) Labs Reviewed  PROTIME-INR - Abnormal; Notable for the following components:      Result Value   Prothrombin Time 18.4 (*)    INR 1.6 (*)    All other components within normal limits  APTT - Abnormal; Notable for the following components:   aPTT 37 (*)    All other components within normal limits  CBC - Abnormal; Notable for the following components:   RDW 16.8 (*)    All other components within normal limits  COMPREHENSIVE METABOLIC PANEL - Abnormal; Notable for the following components:   Sodium 134 (*)    Potassium 2.9 (*)    Chloride 96 (*)    Glucose, Bld 157 (*)    Calcium 8.6 (*)    Albumin 2.9 (*)    All other components within normal limits  RESP PANEL BY RT-PCR (FLU A&B, COVID) ARPGX2  ETHANOL  DIFFERENTIAL  URINALYSIS, ROUTINE W REFLEX MICROSCOPIC  BASIC METABOLIC PANEL  MAGNESIUM  I-STAT CHEM 8, ED    EKG None  Radiology DG CHEST PORT 1 VIEW  Result Date: 09/05/2021 CLINICAL DATA:  Altered mental status EXAM: PORTABLE CHEST 1 VIEW COMPARISON:  Previous studies including the examination of 05/30/2020 FINDINGS: Transverse diameter of heart is increased. Large hiatal hernia is seen in the retrocardiac region. There is increased density in the left lower lung fields. There is marked elevation of right hemidiaphragm. There are no signs of alveolar pulmonary edema. Evaluation of lower lung fields for infiltrates is limited  by large hiatal hernia and elevation of right hemidiaphragm. There is no significant pleural effusion or pneumothorax. IMPRESSION: Cardiomegaly. Increased density in the left lower lung fields most likely is due to fluid-filled stomach. Possibility of new infiltrate in the left lower lung fields is not excluded. Electronically Signed   By: Elmer Picker M.D.   On: 09/05/2021 15:31   EEG adult  Result Date: 09/05/2021 Lora Havens, MD     09/06/2021  7:48 AM Patient Name: Heather [name] MRN: [id number] Epilepsy Attending: Marcelle Overlie  Barbra Sarks Referring Physician/Provider: Amie Portland, MD Date: 09/05/2021 Duration: 21.29 mins Patient history: 88yo F with sudden onset inability to speak and left gaze deviation. EEG to evaluate for seizure Level of alertness:  lethargic AEDs during EEG study: LEV Technical aspects: This EEG study was done with scalp electrodes positioned according to the 10-20 International system of electrode placement. Electrical activity was acquired at a sampling rate of '500Hz'$  and reviewed with a high frequency filter of '70Hz'$  and a low frequency filter of '1Hz'$ . EEG data were recorded continuously and digitally stored. Description: EEG showed continuous generalized and lateralized right hemisphere 3 to 6 Hz theta-delta slowing admixed with 13-'15Hz'$  generalized beta activity. Lateralized periodic discharges with overriding fast activity ( LPD+F) at  1-1.'5Hz'$  was noted in right hemisphere Hyperventilation and photic stimulation were not performed.   ABNORMALITY - Lateralized periodic discharges with overriding fast activity ( LPD +F) right hemisphere - Continuous slow, generalized and lateralized right hemisphere IMPRESSION: This study showed evidence of epileptogenicity and cortical dysfunction arising from right hemisphere likely due to underlying structural abnormality. This eeg pattern is on the ictal-interictal continuum with higher potential for seizure recurrence. Additionally there is  moderate diffuse encephalopathy, nonspecific etiology. No definite seizures were seen throughout the recording. Janyce Llanos was notified Lora Havens   Overnight EEG with video  Result Date: 09/06/2021 Lora Havens, MD     09/06/2021  8:01 AM MRN: [id number] Epilepsy Attending: Lora Havens Referring Physician/Provider: Lora Havens Duration: 09/05/2021 1241 to 09/03/2021 0800  Patient history: 88yo F with sudden onset inability to speak and left gaze deviation. EEG to evaluate for seizure  Level of alertness:  lethargic  AEDs during EEG study: LEV  Technical aspects: This EEG study was done with scalp electrodes positioned according to the 10-20 International system of electrode placement. Electrical activity was acquired at a sampling rate of '500Hz'$  and reviewed with a high frequency filter of '70Hz'$  and a low frequency filter of '1Hz'$ . EEG data were recorded continuously and digitally stored.  Description: EEG showed continuous generalized and lateralized right hemisphere 3 to 6 Hz theta-delta slowing admixed with 13-'15Hz'$  generalized beta activity.  Lateralized periodic discharges with overriding fast activity ( LPD+F) at 1-1.'5Hz'$  was noted in right hemisphere. These appeared rhythmic with evolution in frequency and morphology and at times involved left  frontal region, lasting 3-9 seconds consistent with brief ictal--interictal discharges ( BIRDS). After BIRDs, eeg attenuation was noted at times.  Hyperventilation and photic stimulation were not performed.    ABNORMALITY - Brief ictal--interictal discharges ( BIRDS), right hemisphere - Lateralized periodic discharges with overriding fast activity ( LPD +F) right hemisphere - Continuous slow, generalized and lateralized right hemisphere  IMPRESSION: This study showed evidence of epileptogenicity and cortical dysfunction arising from right hemisphere likely due to underlying structural abnormality. Brief ictal-interictal rhythmic discharges( BIRDs) are on  the ictal-interictal continuum. However, the frequency and attenuation after BIRDs was more concerning for ictal nature. Additionally there is moderate diffuse encephalopathy, nonspecific etiology.  Dr Theda Sers was notified  Lora Havens   CT HEAD CODE STROKE WO CONTRAST  Result Date: 09/05/2021 CLINICAL DATA:  Code stroke. Neuro deficit, acute, stroke suspected. Left-sided gaze. EXAM: CT HEAD WITHOUT CONTRAST TECHNIQUE: Contiguous axial images were obtained from the base of the skull through the vertex without intravenous contrast. RADIATION DOSE REDUCTION: This exam was performed according to the departmental dose-optimization program which includes automated exposure control, adjustment of the mA and/or kV according to patient  size and/or use of iterative reconstruction technique. COMPARISON:  Prior head CT examinations 05/30/2020 and earlier. Brain MRI 01/25/2017. FINDINGS: Brain: Mild generalized cerebral atrophy. Redemonstrated foci of chronic encephalomalacia/gliosis within the anteroinferior frontal lobes, bilaterally. As before, there is irregular hyperdense and partially calcified material along the anteroinferior right frontal lobe, which may reflect sequela of the reported prior skull base cephalocele repair. Redemonstrated moderate-sized chronic cortical/subcortical infarct within the right parietal lobe. Background moderate to advanced patchy and ill-defined hypoattenuation within the cerebral white matter, nonspecific but compatible with chronic small vessel ischemic disease. Known small chronic infarcts within the left cerebellar hemisphere, better appreciated on the prior brain MRI of 01/25/2017. There is no acute intracranial hemorrhage. No acute demarcated cortical infarct. No extra-axial fluid collection. No evidence of an intracranial mass. No midline shift. Vascular: No hyperdense vessel.  Atherosclerotic calcifications. Skull: Right frontoparietal cranioplasty. No fracture or aggressive  osseous lesion. Sinuses/Orbits: No mass or acute finding within the imaged orbits. Postsurgical appearance of the paranasal sinuses. Small mucous retention cyst within the right maxillary sinus. Mild mucosal thickening within the right sphenoid sinus with associated chronic reactive osteitis. No significant mastoid effusion. ASPECTS [facility] Stroke Program Early CT Score) - Ganglionic level infarction (caudate, lentiform nuclei, internal capsule, insula, M1-M3 cortex): 7 - Supraganglionic infarction (M4-M6 cortex): 3 Total score (0-10 with 10 being normal): 10 (when discounting chronic infarcts). These results were communicated to Dr. Rory Percy at 11:56 amon 5/26/2023by text page via the [facility] messaging system. IMPRESSION: 1. No evidence of acute intracranial abnormality. 2. Stable non-contrast CT appearance of the brain as compared to 05/30/2020. 3. Foci of chronic encephalomalacia/gliosis within the anteroinferior frontal lobes, bilaterally. 4. Moderate-sized chronic right MCA territory infarct within the right parietal lobe. 5. Background moderate/advanced cerebral white matter chronic small vessel ischemic disease. 6. Known small chronic infarcts within the left cerebellar hemisphere 7. Mild generalized cerebral atrophy. 8. Right frontoparietal cranioplasty. 9. Paranasal sinus disease, as described. Electronically Signed   By: Kellie Simmering D.O.   On: 09/05/2021 11:58    Procedures Procedures    Medications Ordered in ED Medications  donepezil (ARICEPT) tablet 10 mg (10 mg Oral Given 09/06/21 0810)  QUEtiapine (SEROQUEL) tablet 25 mg (25 mg Oral Given 09/05/21 2135)  traZODone (DESYREL) tablet 25-50 mg (has no administration in time range)  pantoprazole (PROTONIX) EC tablet 40 mg (40 mg Oral Given 09/06/21 0810)  rivaroxaban (XARELTO) tablet 10 mg (has no administration in time range)  montelukast (SINGULAIR) tablet 10 mg (10 mg Oral Given 09/05/21 2135)  acetaminophen (TYLENOL) tablet 650 mg (has no  administration in time range)    Or  acetaminophen (TYLENOL) 160 MG/5ML solution 650 mg (has no administration in time range)    Or  acetaminophen (TYLENOL) suppository 650 mg (has no administration in time range)  senna-docusate (Senokot-S) tablet 1 tablet (has no administration in time range)  aspirin suppository 300 mg ( Rectal See Alternative 09/06/21 0810)    Or  aspirin tablet 325 mg (325 mg Oral Given 09/06/21 0810)  LORazepam (ATIVAN) injection 1 mg (has no administration in time range)  levETIRAcetam (KEPPRA) 750 mg in sodium chloride 0.9 % 100 mL IVPB (0 mg Intravenous Stopped 09/06/21 0915)  potassium chloride (KLOR-CON) packet 40 mEq (has no administration in time range)  LORazepam (ATIVAN) injection 1 mg ( Intravenous Canceled Entry 09/05/21 1838)  levETIRAcetam (KEPPRA) IVPB 1000 mg/100 mL premix (0 mg Intravenous Stopped 09/05/21 1223)   stroke: early stages of recovery book ( Does not apply  Given 09/05/21 1838)    ED Course/ Medical Decision Making/ A&P  CRITICAL CARE Performed by: Milton Ferguson Total critical care time: 40 minutes Critical care time was exclusive of separately billable procedures and treating other patients. Critical care was necessary to treat or prevent imminent or life-threatening deterioration. Critical care was time spent personally by me on the following activities: development of treatment plan with patient and/or surrogate as well as nursing, discussions with consultants, evaluation of patient's response to treatment, examination of patient, obtaining history from patient or surrogate, ordering and performing treatments and interventions, ordering and review of laboratory studies, ordering and review of radiographic studies, pulse oximetry and re-evaluation of patient's condition.                          Medical Decision Making Amount and/or Complexity of Data Reviewed Labs: ordered. Radiology: ordered.  Risk Prescription drug management. Decision  regarding hospitalization.    This patient presents to the ED for concern of altered mental status, this involves an extensive number of treatment options, and is a complaint that carries with it a high risk of complications and morbidity.  The differential diagnosis includes stroke, seizure   Co morbidities that complicate the patient evaluation  History of stroke GERD hypertension dementia   Additional history obtained:  Additional history obtained from family External records from outside source obtained and reviewed including hospital record   Lab Tests:  I Ordered, and personally interpreted labs.  The pertinent results include: Chemistries show potassium 2.9   Imaging Studies ordered:  I ordered imaging studies including CT head no acute stroke I independently visualized and interpreted imaging which showed no acute stroke I agree with the radiologist interpretation   Cardiac Monitoring: / EKG:  The patient was maintained on a cardiac monitor.  I personally viewed and interpreted the cardiac monitored which showed an underlying rhythm of: Normal sinus rhythm   Consultations Obtained:  I requested consultation with the neurology and hospital,  and discussed lab and imaging findings as well as pertinent plan - they recommend: Admission and treat for possible seizures and stroke   Problem List / ED Course / Critical interventions / Medication management  Altered mental status, dementia I ordered medication including Ativan and Keppra for seizure Reevaluation of the patient after these medicines showed that the patient stayed the same I have reviewed the patients home medicines and have made adjustments as needed   Social Determinants of Health:  Lives with family   Test / Admission - Considered:  Additional tests may be MRI of the brain and EEG  Patient initially was a code stroke and neurology saw her.  It appears she may be having seizures.  Patient will  be admitted to medicine with neurology following the        Final Clinical Impression(s) / ED Diagnoses Final diagnoses:  Confusion    Rx / DC Orders ED Discharge Orders     None         Milton Ferguson, MD 09/06/21 1049

## 2021-09-06 NOTE — Progress Notes (Signed)
vLTM discontinued  Patient discharged  [facility] notified  No skin breakdown at all skin sites

## 2021-09-06 NOTE — TOC Transition Note (Signed)
Transition of Care [facility]) - CM/SW Discharge Note   Patient Details  Name: KEYERRA LAMERE MRN: [id number] Date of Birth: 1928-08-10  [facility] CM/SW Contact:  Carles Collet, RN Phone Number: 09/06/2021, 12:54 PM   Clinical Narrative:    Damaris Schooner w patient's daughter, [name]. Patient will return home with [name] and her son in law. [facility] will be called shortly. Forms to chart Home oxygen set up with [employer], it will be delivered to the house in about an hour per liaison Jermiane. [place] services will continue with [employer].     Final next level of care: [place] Barriers to Discharge: No Barriers Identified   Patient Goals and CMS Choice Patient states their goals for this hospitalization and ongoing recovery are:: return home CMS Medicare.gov Compare Post Acute Care list provided to:: Other (Comment Required) Choice offered to / list presented to : Spouse  Discharge Placement                       Discharge Plan and Services                DME Arranged: Oxygen DME Agency: [employer] Date DME Agency Contacted: 09/06/21 Time DME Agency Contacted: 2010 Representative spoke with at DME Agency: [facility]on Grills HH Arranged: PT [place]: [place] ([place]) Date [place]: 09/06/21 Time [place]: [place] Representative spoke with at [place]: [place] () Interventions     Readmission Risk Interventions     View : No data to display.

## 2021-09-06 NOTE — Care Management Obs Status (Signed)
[place] NOTIFICATION   Patient Details  Name: Heather [name] MRN: [id number] Date of Birth: 07/09/1928   Medicare Observation Status Notification Given:  Yes    Carles Collet, RN 09/06/2021, 11:56 AM

## 2021-09-06 NOTE — Procedures (Addendum)
MRN: [id number]  Epilepsy Attending: Lora Havens  Referring Physician/Provider: Lora Havens  Duration: 09/05/2021 1241 to 09/03/2021  1442   Patient history: 88yo F with sudden onset inability to speak and left gaze deviation. EEG to evaluate for seizure   Level of alertness:  lethargic    AEDs during EEG study: LEV   Technical aspects: This EEG study was done with scalp electrodes positioned according to the 10-20 International system of electrode placement. Electrical activity was acquired at a sampling rate of '500Hz'$  and reviewed with a high frequency filter of '70Hz'$  and a low frequency filter of '1Hz'$ . EEG data were recorded continuously and digitally stored.    Description: EEG showed continuous generalized and lateralized right hemisphere 3 to 6 Hz theta-delta slowing admixed with 13-'15Hz'$  generalized beta activity.   Lateralized periodic discharges with overriding fast activity ( LPD+F) at 1-1.'5Hz'$  was noted in right hemisphere. These appeared rhythmic with evolution in frequency and morphology and at times involved left  frontal region, lasting 3-9 seconds consistent with brief ictal--interictal discharges ( BIRDS). After BIRDs, eeg attenuation was noted at times.    Hyperventilation and photic stimulation were not performed.      ABNORMALITY - Brief ictal--interictal discharges ( BIRDS), right hemisphere - Lateralized periodic discharges with overriding fast activity ( LPD +F) right hemisphere - Continuous slow, generalized and lateralized right hemisphere   IMPRESSION: This study showed evidence of epileptogenicity and cortical dysfunction arising from right hemisphere likely due to underlying structural abnormality.   Brief ictal-interictal rhythmic discharges( BIRDs) are on the ictal-interictal continuum. However, the frequency and attenuation after BIRDs was more concerning for ictal nature. Additionally there is moderate diffuse encephalopathy, nonspecific etiology.    Dr  Theda Sers was notified   Lora Havens

## 2021-09-06 NOTE — Progress Notes (Signed)
[facility] to transport pt home. Discharge paperwork reviewed with pt's daughters.

## 2021-09-06 NOTE — Progress Notes (Signed)
PT Cancellation Note  Patient Details Name: Heather [name] MRN: [id number] DOB: February 28, 1929   Cancelled Treatment:    Reason Eval/Treat Not Completed: Patient at procedure or test/unavailable  Patient undergoing EEG. Spoke with daughter to get prior status. She was participating in [name] for strengthening and sitting balance. She does not stand and they do use hoyer lift to transfer her to reclining wheelchair. Will proceed with PT evaluation after EEG completed.   Arby Barrette, PT Acute Rehabilitation Services  Pager [phone] Office [phone]   [name] 09/06/2021, 8:21 AM

## 2021-09-06 NOTE — Discharge Instructions (Signed)
Follow with Primary MD Biagio Borg, MD in 7 days   Get CBC, CMP, 2 view Chest X ray -  checked next visit within 1 week by Primary MD    Activity: As tolerated with Full fall precautions use walker/cane & assistance as needed  Disposition Home   Diet: Dysphagia 2 with feeding assistance and aspiration precautions.   Special Instructions: If you have smoked or chewed Tobacco  in the last 2 yrs please stop smoking, stop any regular Alcohol  and or any Recreational drug use.  On your next visit with your primary care physician please Get Medicines reviewed and adjusted.  Please request your Prim.MD to go over all Hospital Tests and Procedure/Radiological results at the follow up, please get all Hospital records sent to your Prim MD by signing hospital release before you go home.  If you experience worsening of your admission symptoms, develop shortness of breath, life threatening emergency, suicidal or homicidal thoughts you must seek medical attention immediately by calling 911 or calling your MD immediately  if symptoms less severe.  You Must read complete instructions/literature along with all the possible adverse reactions/side effects for all the Medicines you take and that have been prescribed to you. Take any new Medicines after you have completely understood and accpet all the possible adverse reactions/side effects.

## 2021-09-07 DIAGNOSIS — Z8673 Personal history of transient ischemic attack (TIA), and cerebral infarction without residual deficits: Secondary | ICD-10-CM | POA: Diagnosis not present

## 2021-09-07 DIAGNOSIS — Z79899 Other long term (current) drug therapy: Secondary | ICD-10-CM | POA: Diagnosis not present

## 2021-09-07 DIAGNOSIS — Z7901 Long term (current) use of anticoagulants: Secondary | ICD-10-CM | POA: Diagnosis not present

## 2021-09-07 DIAGNOSIS — I10 Essential (primary) hypertension: Secondary | ICD-10-CM | POA: Diagnosis not present

## 2021-09-07 DIAGNOSIS — Z9181 History of falling: Secondary | ICD-10-CM | POA: Diagnosis not present

## 2021-09-07 DIAGNOSIS — Z9981 Dependence on supplemental oxygen: Secondary | ICD-10-CM | POA: Diagnosis not present

## 2021-09-07 DIAGNOSIS — E7800 Pure hypercholesterolemia, unspecified: Secondary | ICD-10-CM | POA: Diagnosis not present

## 2021-09-07 DIAGNOSIS — F03A18 Unspecified dementia, mild, with other behavioral disturbance: Secondary | ICD-10-CM | POA: Diagnosis not present

## 2021-09-07 DIAGNOSIS — I2699 Other pulmonary embolism without acute cor pulmonale: Secondary | ICD-10-CM | POA: Diagnosis not present

## 2021-09-07 DIAGNOSIS — F05 Delirium due to known physiological condition: Secondary | ICD-10-CM | POA: Diagnosis not present

## 2021-09-07 DIAGNOSIS — R4701 Aphasia: Secondary | ICD-10-CM | POA: Diagnosis not present

## 2021-09-09 DIAGNOSIS — Z8673 Personal history of transient ischemic attack (TIA), and cerebral infarction without residual deficits: Secondary | ICD-10-CM | POA: Diagnosis not present

## 2021-09-09 DIAGNOSIS — M6281 Muscle weakness (generalized): Secondary | ICD-10-CM | POA: Diagnosis not present

## 2021-09-09 DIAGNOSIS — G40909 Epilepsy, unspecified, not intractable, without status epilepticus: Secondary | ICD-10-CM | POA: Diagnosis not present

## 2021-09-09 DIAGNOSIS — F0390 Unspecified dementia without behavioral disturbance: Secondary | ICD-10-CM | POA: Diagnosis not present

## 2021-09-10 ENCOUNTER — Telehealth: Payer: Self-pay | Admitting: Neurology

## 2021-09-10 NOTE — Telephone Encounter (Signed)
Pt's daughter called in stating her mom had an episode and was taken to the ED on 09/05/21. They ruled out a stroke and felt it was a seizure. They put her on Keppra for a month. The ED recommended her be seen in our office within 1 week. The patient has recently become agitated very easily, which is not her usual demeanor. She is wondering if this could be a side effect of the Keppra?

## 2021-09-11 NOTE — Telephone Encounter (Signed)
Spoke with pt daughter informed her that the agitation is most likely due to [place] and that Clarise Cruz the PA will see them tomorrow,

## 2021-09-11 NOTE — Telephone Encounter (Signed)
Pls let daughter know that the agitation is most likely due to [place]. Will put on Sara's schedule tomorrow for urgent f/u, thanks

## 2021-09-12 ENCOUNTER — Telehealth (INDEPENDENT_AMBULATORY_CARE_PROVIDER_SITE_OTHER): Payer: Medicare Other | Admitting: Physician Assistant

## 2021-09-12 DIAGNOSIS — G40909 Epilepsy, unspecified, not intractable, without status epilepticus: Secondary | ICD-10-CM | POA: Diagnosis not present

## 2021-09-12 DIAGNOSIS — F03B18 Unspecified dementia, moderate, with other behavioral disturbance: Secondary | ICD-10-CM | POA: Diagnosis not present

## 2021-09-12 MED ORDER — LAMOTRIGINE 25 MG PO TABS: ORAL_TABLET | ORAL | 3 refills | Status: DC

## 2021-09-12 NOTE — Progress Notes (Signed)
Virtual Visit via Video Note The purpose of this virtual visit is to provide medical care    Consent was obtained for video visit:  {yes no:[id number]} Answered questions that patient had about telehealth interaction:  {yes no:314532} I discussed the limitations, risks, security and privacy concerns of performing an evaluation and management service by telemedicine. I also discussed with the patient that there may be a patient responsible charge related to this service. The patient expressed understanding and agreed to proceed.  Pt location: Home Physician Location: office Name of referring provider:  Biagio Borg, MD I connected with [name] at patients initiation/request on 09/12/2021 at  9:30 AM EDT by video enabled telemedicine application and verified that I am speaking with the correct person using two identifiers. Pt MRN:  [id number] Pt DOB:  July 20, 1928 Video Participants:  [name];  ***   History of Present Illness: ***  The patient had a beard 12 video visit on 09/12/2021 after hospitalization for possible seizures. At the ER she was started on Keppra.  The daughter reported that she was having more agitation, which was a new symptom.  Keppra was suspected to be the culprit.   The patient had a virtual video visit on 08/15/2021. She was last evaluated in the neurology clinic in 03/2020 for dementia with behavioral disturbance. Her daughter [name] provides the history. She is bedbound and getting a pedicure during her visit today. She is in good spirits. [name] reports she has been overall stable from a dementia standpoint since her last visit. She has 24/7 care. She is able to feed herself and appetite is pretty good (patient says "damn well!"). No dysphagia. Family manages medications, finances, meals. She needs a hoyer lift for transfers. Behaviors overall stable, no hallucinations/paranoia, she may get temper tantrums if she does not want to stay in her wheelchair. Sleep is good,  she is on Trazodone and Seroquel every night. She is on Donepezil '10mg'$  daily. No side effects on medications. No falls. She was in the ER in 05/2020 after a syncopal episode as she was finishing up breakfast, she suddenly slumped over for 3-4 minutes then had nausea/vomiting. She was back to baseline on EMS arrival. [name] reported similar spells in the past usually after eating. No convulsive activity seen. She denies any headaches or dizziness.  *** 2 x a day agitation , mild manner and  Yelling, fists, pulling cover, decreased appetite (speech therapy was there and she was bothered by the person ) Sleeps avbout the same, has tramadol, but w keppra she has not been  No hall  P bkfast 9:20 She was asleep... Chees in her hand She was unresponsive Response and then shook her did not get a responseshook but she did cannot squeeze my hand when I clippedher and and called my husband to call 911 and then he had not called her and queezed her hand 911  He notes tried to go to shake her before [place] visit.  He notes she had finished her visit she finished her visit we will waiting on the nurse to bring all the final paperwork , when patients were started on high dose Lamictal quickly, they may get a very bad rash Kathreen Cosier), but starting low and slow avoids this, but certainly call if any rash.  And she had a similar situation and she stated probably 3 or 4 from more than that and I am taking her to the hospital and they would keep her overnight  monitoring her and her vital signs were always good in Dr. Amparo Bristol office they called for EMT and by the time they got her she was fine but this 1 lasted more than 2 hours I see so at the ER being would be they do the full course to call her vitals she her breathing was normal her blood pressure was 172/110 I believe not responsive they monitor for a while they did a EKG about after about an hour 2 hours she started to be a little responsive they did chest  x-rays they finally put some electrodes on her head for about 4 hours and then they decided to keep her overnight they Did electrodes on did not notice any change in her breathing legs, she has been her stroke doctors say it.  They have been x-rayed and there was no indication that she had had a stroke so that is where they came up with it was probably an absence seizure seizure yes and absence seizure so after the absence seizure they started head on paper at that same I wired everything Yes but prior to that when you started on the [place] before they could really get it going she can is seized up a bit and staring at the ceiling the left side of the ceiling for about 30 minutes and once the Keppra was in her system she relaxed very faint and she has been on Keppra since then so initially was discontinued.  IV already was made, there was the okay so how many days which she in the hospital or she was just in the ER and then she went home she was she stayed overnight perfect and then she was discharged with Keppra how many milligrams 500 500 twice a day yes yet that was probably a large dose and you know we usually build it up but I understand that the logic was to prevent him he found that side effects any cost card to be agitated so he calls the "it when that she be deemed to be agitated per se after starting to take the [place] she can call on Saturday Sunday sSeizures: H/o drug abuse and ETOH. UDS +  No evidence of infectious or other metabolic process causing seizures. UA  Urine culture sent. No headache or other complaints to suggest intracranial process,   - Tele obs -  Seizure order set   - CIWA protocol  - [place] - CK - Social work consult - f/u  Urine culture    EEG     IVF     History on Initial Assessment 03/09/2016: This is an 88 yo RH woman with a history of hypertension, hyperlipidemia, encephalocele s/p surgery in 2003, with memory loss and stroke in October 2017. She had seen her PCP  on 01/01/16 for memory problems. An MRI brain was ordered, however while waiting for the scan, she started having shortness of breath and was admitted 01/24/16 and found to have DVTs in both lower extremities. She was also complaining of generalized weakness with difficulty ambulating. She was discharged home on Xarelto and home PT. She went for outpatient previously scheduled MRI brain on 02/03/16, which I personally reviewed, showing an acute/subacute small left periatrial infarct without associated hemorrhage. There was a remote moderate-sized right parietal lobe infarct. There was marked chronic microvascular change. Remote frontal craniotomy for repair of anterior encephalocele was seen, with significant surrounding encephalomalacia. There was moderate global atrophy without hydrocephalus. She was instructed to go to the ER,  but since she had recent echocardiogram and was started on Xarelto, and had no clear deficits on exam, and she was discharged home with outpatient follow-up.    She reports that her memory has been "on and off" for conversations. She can hold a conversation then just "slacks off." Her daughter has noticed this and feels that she loses interest, cutting off conversations. She endorses some word-finding difficulties. Her daughter states that sometimes it is hard to say about her memory because of her "quirky sense of humor." For instance, this morning she said she did not want breakfast. After her daughter came home, she told her she was hungry, so her daughter fixed food. Once made, she stated she did not want food. She only repeats herself when they are having conversations in the bathroom at 3am. She had previously been living with her husband until hospital admission, and has been living with her daughter for the past 2 months. She denied any missed bill payments when she was living with her husband. She is pretty good with remembering to take her morning medications. She stopped driving  due to mobility issues 3 years ago. She has been using a wheelchair since recent hospital discharge, however her daughter reports that for the past 2 years, she has not walked a lot because of bilateral leg weakness. She would use a walker and hold on to furniture. She feels her left arm and leg are weaker. She has occasional tingling in both hands. She needs assistance with dressing and bathing. Her daughter reports a couple of falls in the past 2 years, she would tell her daughter about them 1-2 weeks later. She denies any prior history of stroke. She denies any headaches, dizziness, diplopia, dysarthria/dysphagia. No personality changes or hallucinations. She is having some constipation, but her bigger issue is urinary urgency. She has the urge to urinate 4 to 6 times an hour at night, but once she sits on the commode, she does not urinate. During the day, she has the urge around 3 times an hour.       Current Outpatient Medications on File Prior to Visit  Medication Sig Dispense Refill   amLODipine (NORVASC) 5 MG tablet TAKE 1 & 1/2 (ONE & ONE-HALF) TABLETS BY MOUTH ONCE DAILY (Patient taking differently: Take 7.5 mg by mouth daily.) 135 tablet 1   Dextromethorphan-guaiFENesin (MUCINEX DM) 30-600 MG TB12 Take 1 tablet by mouth every 12 (twelve) hours as needed for cough or congestion.     donepezil (ARICEPT) 10 MG tablet Take 1 tablet (10 mg total) by mouth daily. 90 tablet 3   EQ ALLERGY RELIEF, CETIRIZINE, 10 MG tablet Take 1 tablet by mouth once daily (Patient taking differently: Take 10 mg by mouth daily.) 90 tablet 3   levETIRAcetam (KEPPRA) 500 MG tablet Take 1 tablet (500 mg total) by mouth 2 (two) times daily. 60 tablet 0   loperamide (IMODIUM) 2 MG capsule Take 2 mg by mouth daily as needed for diarrhea or loose stools.     montelukast (SINGULAIR) 10 MG tablet Take 1 tablet by mouth once daily (Patient taking differently: Take 10 mg by mouth at bedtime.) 90 tablet 3   pantoprazole  (PROTONIX) 40 MG tablet Take 1 tablet (40 mg total) by mouth 2 (two) times daily. (Patient taking differently: Take 1 tablet by mouth daily.) 90 tablet 0   QUEtiapine (SEROQUEL) 25 MG tablet Take 1 tablet (25 mg total) by mouth at bedtime. 90 tablet 3   traZODone (DESYREL)  50 MG tablet TAKE 1/2 TO 1 (ONE-HALF TO ONE) TABLET BY MOUTH AT BEDTIME AS NEEDED FOR SLEEP (Patient taking differently: Take 25-50 mg by mouth at bedtime as needed for sleep.) 90 tablet 1   XARELTO 10 MG TABS tablet Take 10 mg by mouth at bedtime.     [DISCONTINUED] apixaban (ELIQUIS) 5 MG TABS tablet Take 1 tablet (5 mg total) by mouth 2 (two) times daily. (Patient not taking: Reported on 04/03/2020) 60 tablet 11   [DISCONTINUED] levothyroxine (SYNTHROID, LEVOTHROID) 25 MCG tablet Take 0.5 tablets (12.5 mcg total) by mouth daily before breakfast. (Patient not taking: Reported on 04/03/2020) 30 tablet 0   No current facility-administered medications on file prior to visit.     Observations/Objective:   There were no vitals filed for this visit. GEN:  The patient appears stated age and is in NAD.  Neurological examination: Patient is awake, alert, oriented x 3. No aphasia or dysarthria. Intact fluency and comprehension. Remote and recent memory intact. Able to name and repeat. Cranial nerves: Extraocular movements intact with no nystagmus. No facial asymmetry. Motor: moves all extremities symmetrically, at least anti-gravity x 4. No incoordination on finger to nose testing. Gait: narrow-based and steady, able to tandem walk adequately. Negative Romberg test.  ***Neurological examination: Patient is awake, alert, oriented to person. States she is 88 years old, month is "[birth year]" - her birthday. No aphasia or dysarthria. Remote and recent memory impaired. Cranial nerves: Extraocular movements intact with no nystagmus. No facial asymmetry. Motor: moves upper extremities symmetrically, at least anti-gravity. No incoordination on finger  to nose testing. Gait: not tested (bedbound)    Assessment and Plan:    ***This is a 88 yo RH woman with a history of hypertension, hyperlipidemia, encephalocele s/p surgery in 2003, subcortical stroke in October 2017, with moderate dementia with behavioral disturbance. She requires 24/7 care. Family reports symptoms overall stable, continue current regimen, refills sent for Donepezil '10mg'$  daily and Seroquel '25mg'$  qhs.  Follow-up in 1 year, call for any changes.   ***Possible seizures,  Increased agitation, likely due to Keppra. Will wean of Keppra to 1 tab daily for 1 week and then stop Take lamotrigine 25 mg 1 tab twice daily for 2 weeks, then increase to 2 tabs twice daily.  Side effects including dizziness, double vision were discussed.  If started on high dose Lamictal quickly, there is a risk for very bad rash Kathreen Cosier), for which we are starting at the low-dose to avoid this.  Certainly, call our office if there is any rash present.      Follow Up Instructions:    -I discussed the assessment and treatment plan with the patient. The patient was provided an opportunity to ask questions and all were answered. The patient agreed with the plan and demonstrated an understanding of the instructions.   The patient was advised to call back or seek an in-person evaluation if the symptoms worsen or if the condition fails to improve as anticipated.    Total time spent on today's visit was ***minutes, including both face-to-face time and nonface-to-face time.  Time included that spent on review of records (prior notes available to me/labs/imaging if pertinent), discussing treatment and goals, answering patient's questions and coordinating care.   Sharene Butters, PA-C

## 2021-09-13 NOTE — Patient Instructions (Addendum)
Will wean of Keppra to 1 tab daily for 1 week and then stop Take lamotrigine 25 mg 1 tab twice daily for 2 weeks, then increase to 2 tabs twice daily.  Side effects including dizziness, double vision were discussed.  If started on high dose Lamictal quickly, there is a risk for very bad rash Kathreen Cosier), for which we are starting at the low-dose to avoid this.  Certainly, call our office if there is any rash present.   Follow up in 1 month For dementia, continue Donepezil '10mg'$  daily and Seroquel '25mg'$  qhs. Continue 24/7 care

## 2021-09-19 DIAGNOSIS — J9610 Chronic respiratory failure, unspecified whether with hypoxia or hypercapnia: Secondary | ICD-10-CM | POA: Diagnosis not present

## 2021-09-22 DIAGNOSIS — E876 Hypokalemia: Secondary | ICD-10-CM | POA: Diagnosis not present

## 2021-09-22 DIAGNOSIS — F0390 Unspecified dementia without behavioral disturbance: Secondary | ICD-10-CM | POA: Diagnosis not present

## 2021-09-22 DIAGNOSIS — Z8673 Personal history of transient ischemic attack (TIA), and cerebral infarction without residual deficits: Secondary | ICD-10-CM | POA: Diagnosis not present

## 2021-09-22 DIAGNOSIS — M6281 Muscle weakness (generalized): Secondary | ICD-10-CM | POA: Diagnosis not present

## 2021-09-22 DIAGNOSIS — J309 Allergic rhinitis, unspecified: Secondary | ICD-10-CM | POA: Diagnosis not present

## 2021-10-06 DIAGNOSIS — F0390 Unspecified dementia without behavioral disturbance: Secondary | ICD-10-CM | POA: Diagnosis not present

## 2021-10-06 DIAGNOSIS — B379 Candidiasis, unspecified: Secondary | ICD-10-CM | POA: Diagnosis not present

## 2021-10-06 DIAGNOSIS — R54 Age-related physical debility: Secondary | ICD-10-CM | POA: Diagnosis not present

## 2021-10-07 DIAGNOSIS — I10 Essential (primary) hypertension: Secondary | ICD-10-CM | POA: Diagnosis not present

## 2021-10-07 DIAGNOSIS — F03A18 Unspecified dementia, mild, with other behavioral disturbance: Secondary | ICD-10-CM | POA: Diagnosis not present

## 2021-10-07 DIAGNOSIS — R4701 Aphasia: Secondary | ICD-10-CM | POA: Diagnosis not present

## 2021-10-07 DIAGNOSIS — I2699 Other pulmonary embolism without acute cor pulmonale: Secondary | ICD-10-CM | POA: Diagnosis not present

## 2021-10-07 DIAGNOSIS — Z9181 History of falling: Secondary | ICD-10-CM | POA: Diagnosis not present

## 2021-10-07 DIAGNOSIS — R0902 Hypoxemia: Secondary | ICD-10-CM | POA: Diagnosis not present

## 2021-10-07 DIAGNOSIS — Z8673 Personal history of transient ischemic attack (TIA), and cerebral infarction without residual deficits: Secondary | ICD-10-CM | POA: Diagnosis not present

## 2021-10-07 DIAGNOSIS — F05 Delirium due to known physiological condition: Secondary | ICD-10-CM | POA: Diagnosis not present

## 2021-10-07 DIAGNOSIS — Z7901 Long term (current) use of anticoagulants: Secondary | ICD-10-CM | POA: Diagnosis not present

## 2021-10-07 DIAGNOSIS — Z79899 Other long term (current) drug therapy: Secondary | ICD-10-CM | POA: Diagnosis not present

## 2021-10-07 DIAGNOSIS — Z9981 Dependence on supplemental oxygen: Secondary | ICD-10-CM | POA: Diagnosis not present

## 2021-10-07 DIAGNOSIS — E7800 Pure hypercholesterolemia, unspecified: Secondary | ICD-10-CM | POA: Diagnosis not present

## 2021-10-17 ENCOUNTER — Telehealth (INDEPENDENT_AMBULATORY_CARE_PROVIDER_SITE_OTHER): Payer: Medicare Other | Admitting: Physician Assistant

## 2021-10-17 DIAGNOSIS — G40909 Epilepsy, unspecified, not intractable, without status epilepticus: Secondary | ICD-10-CM

## 2021-10-17 DIAGNOSIS — F03B18 Unspecified dementia, moderate, with other behavioral disturbance: Secondary | ICD-10-CM | POA: Diagnosis not present

## 2021-10-17 NOTE — Progress Notes (Signed)
Virtual Visit via Video Note The purpose of this virtual visit is to provide medical care in a patient that is unable to be seen in person due to physical or health limitations   Consent was obtained for video visit:  yes  Answered questions that patient had about telehealth interaction:  yes I discussed the limitations, risks, security and privacy concerns of performing an evaluation and management service by telemedicine. I also discussed with the patient that there may be a patient responsible charge related to this service. The patient expressed understanding and agreed to proceed.  Pt location: Home Physician Location: office Name of referring provider:  Biagio Borg, MD I connected with Heather [name] at patients initiation/request on 10/17/2021 at  9:30 AM EDT by video enabled telemedicine application and verified that I am speaking with the correct person using two identifiers. Pt MRN:  [id number] Pt DOB:  1928-04-25 Video Participants:  Heather [name]; daughter Heather [name]    Assessment and Plan:   Heather [name]  is a 88 y.o. RH female with a history of HTN, HLD, RMCA chronic CVA, Impaired glucose tolerance, h/o PE on Xarelto , bed-bound, followed  for moderate dementia with behavioral disturbance.  seen today in follow up.  The patient is overall stable from the cognitive standpoint, and is tolerating on the cognitive and mood medications without any significant changes since her prior visit.  No further seizure-like activity was noted.  No new episodes of agitation.  Continue lamotrigine (Lamictal) 25 mg 2 tabs twice daily Continue donepezil 10 mg daily Continue Seroquel 25 mg nightly Continue 24/7 care Follow-up in 6 months   History of Present Illness:  Heather [name]  is 86/7 care at home.  Her daughter Heather [name] provides the history.  She was last seen on a virtual visit on 09/12/2021 at which time there were changes in her seizure medication after the patient had experienced seizures  1 week prior.  She is tolerating the medication well without any side effects.  She is seen today for evaluation of memory.  Her daughter reports that she is doing well, and no further episodes of agitation.  She is taking Seroquel 25 mg nightly, tolerating well.  Her daughter states that she responds to questions, but does not initiate conversation unless she is watching TV.  However, her thoughts and communications have cleared since her last presentation to the ER. Patient lives with: Daughter  repeats oneself? Endorsed  Recent falls? Denies.  The patient is bedbound. Any head injuries?  Daughter denies Any seizures since her last visit?  Daughter denies Wandering behavior?  Patient is bedbound Patient drives?   Patient no longer drives   Appetite is good  Any mood changes?  Daughter denies, the patient "leveled out ". Hallucinations? Denies  Paranoia? Denies  Patient reports that he sleeps well without vivid dreams, REM behavior or sleepwalking.  However, if there is any light that outside, this would startle her.  She wakes up around 6 AM for good, and she does not take any frequent naps, perhaps at 1 PM she takes one.   Any hygiene concerns?  Denies Independent of bathing and dressing?  Daughter is in charge of cleaning her, the patient is compliant. Does the patient needs help with medications?  Daughter is in charge of the medications Who is in charge of the finances?  Daughter is in charge of the finances Any changes in appetite?  She has good appetite Patient have trouble swallowing?  Denies  Any headaches?  Denies Double vision?  Denies Any focal numbness or tingling?  Denies  unilateral weakness?  Denies Any tremors?  Denies Any incontinence of urine the patient wears diapers, and her daughter make sure that she takes a sip of water every 30 minutes to increase the water intake.  She also takes popsicles to help her maintain hydrated.  Any bowel dysfunction?  And daughter state  chronic constipation, the daughter uses stool softeners with good result.        History on Initial Assessment 03/09/2016: This is an 88 yo RH woman with a history of hypertension, hyperlipidemia, encephalocele s/p surgery in 2003, with memory loss and stroke in October 2017. She had seen her PCP on 01/01/16 for memory problems. An MRI brain was ordered, however while waiting for the scan, she started having shortness of breath and was admitted 01/24/16 and found to have DVTs in both lower extremities. She was also complaining of generalized weakness with difficulty ambulating. She was discharged home on Xarelto and home PT. She went for outpatient previously scheduled MRI brain on 02/03/16, which I personally reviewed, showing an acute/subacute small left periatrial infarct without associated hemorrhage. There was a remote moderate-sized right parietal lobe infarct. There was marked chronic microvascular change. Remote frontal craniotomy for repair of anterior encephalocele was seen, with significant surrounding encephalomalacia. There was moderate global atrophy without hydrocephalus. She was instructed to go to the ER, but since she had recent echocardiogram and was started on Xarelto, and had no clear deficits on exam, and she was discharged home with outpatient follow-up.    She reports that her memory has been "on and off" for conversations. She can hold a conversation then just "slacks off." Her daughter has noticed this and feels that she loses interest, cutting off conversations. She endorses some word-finding difficulties. Her daughter states that sometimes it is hard to say about her memory because of her "quirky sense of humor." For instance, this morning she said she did not want breakfast. After her daughter came home, she told her she was hungry, so her daughter fixed food. Once made, she stated she did not want food. She only repeats herself when they are having conversations in the bathroom at  3am. She had previously been living with her husband until hospital admission, and has been living with her daughter for the past 2 months. She denied any missed bill payments when she was living with her husband. She is pretty good with remembering to take her morning medications. She stopped driving due to mobility issues 3 years ago. She has been using a wheelchair since recent hospital discharge, however her daughter reports that for the past 2 years, she has not walked a lot because of bilateral leg weakness. She would use a walker and hold on to furniture. She feels her left arm and leg are weaker. She has occasional tingling in both hands. She needs assistance with dressing and bathing. Her daughter reports a couple of falls in the past 2 years, she would tell her daughter about them 1-2 weeks later. She denies any prior history of stroke. She denies any headaches, dizziness, diplopia, dysarthria/dysphagia. No personality changes or hallucinations. She is having some constipation, but her bigger issue is urinary urgency. She has the urge to urinate 4 to 6 times an hour at night, but once she sits on the commode, she does not urinate. During the day, she has the urge around 3 times an hour.  Current Outpatient Medications on File Prior to Visit  Medication Sig Dispense Refill   amLODipine (NORVASC) 5 MG tablet TAKE 1 & 1/2 (ONE & ONE-HALF) TABLETS BY MOUTH ONCE DAILY (Patient taking differently: Take 7.5 mg by mouth daily.) 135 tablet 1   Dextromethorphan-guaiFENesin (MUCINEX DM) 30-600 MG TB12 Take 1 tablet by mouth every 12 (twelve) hours as needed for cough or congestion.     donepezil (ARICEPT) 10 MG tablet Take 1 tablet (10 mg total) by mouth daily. 90 tablet 3   EQ ALLERGY RELIEF, CETIRIZINE, 10 MG tablet Take 1 tablet by mouth once daily (Patient taking differently: Take 10 mg by mouth daily.) 90 tablet 3   lamoTRIgine (LAMICTAL) 25 MG tablet Take 1 tab twice a day for 14 days, then  increase to 2 tabs twice a day 240 tablet 3   loperamide (IMODIUM) 2 MG capsule Take 2 mg by mouth daily as needed for diarrhea or loose stools.     montelukast (SINGULAIR) 10 MG tablet Take 1 tablet by mouth once daily (Patient taking differently: Take 10 mg by mouth at bedtime.) 90 tablet 3   pantoprazole (PROTONIX) 40 MG tablet Take 1 tablet (40 mg total) by mouth 2 (two) times daily. (Patient taking differently: Take 1 tablet by mouth daily.) 90 tablet 0   QUEtiapine (SEROQUEL) 25 MG tablet Take 1 tablet (25 mg total) by mouth at bedtime. 90 tablet 3   traZODone (DESYREL) 50 MG tablet TAKE 1/2 TO 1 (ONE-HALF TO ONE) TABLET BY MOUTH AT BEDTIME AS NEEDED FOR SLEEP (Patient taking differently: Take 25-50 mg by mouth at bedtime as needed for sleep.) 90 tablet 1   XARELTO 10 MG TABS tablet Take 10 mg by mouth at bedtime.     [DISCONTINUED] apixaban (ELIQUIS) 5 MG TABS tablet Take 1 tablet (5 mg total) by mouth 2 (two) times daily. (Patient not taking: Reported on 04/03/2020) 60 tablet 11   [DISCONTINUED] levothyroxine (SYNTHROID, LEVOTHROID) 25 MCG tablet Take 0.5 tablets (12.5 mcg total) by mouth daily before breakfast. (Patient not taking: Reported on 04/03/2020) 30 tablet 0   No current facility-administered medications on file prior to visit.     Observations/Objective:   There were no vitals filed for this visit. GEN:  The patient appears stated age and is in NAD.  Neurological examination: Patient is awake, alert, oriented to person. No aphasia or dysarthria. Intact fluency, reduced comprehension. Remote and recent memory impaired. Cranial nerves: Extraocular movements intact with no nystagmus. No facial asymmetry. Motor: moves all extremities symmetrically, at least anti-gravity x4  she is bedbound.  She does show incoordination on finger to nose testing. Gait: not tested, bedbound     Follow Up Instructions:    -I discussed the assessment and treatment plan with the patient. The  patient was provided an opportunity to ask questions and all were answered. The patient agreed with the plan and demonstrated an understanding of the instructions.   The patient was advised to call back or seek an in-person evaluation if the symptoms worsen or if the condition fails to improve as anticipated.    Total time spent on today's visit was 13 minutes, including both face-to-face time and nonface-to-face time.  Time included that spent on review of records (prior notes available to me/labs/imaging if pertinent), discussing treatment and goals, answering patient's questions and coordinating care.   Sharene Butters, PA-C

## 2021-10-20 DIAGNOSIS — F0390 Unspecified dementia without behavioral disturbance: Secondary | ICD-10-CM | POA: Diagnosis not present

## 2021-10-20 DIAGNOSIS — J9611 Chronic respiratory failure with hypoxia: Secondary | ICD-10-CM | POA: Diagnosis not present

## 2021-10-24 DIAGNOSIS — R531 Weakness: Secondary | ICD-10-CM | POA: Diagnosis not present

## 2021-10-24 DIAGNOSIS — J0101 Acute recurrent maxillary sinusitis: Secondary | ICD-10-CM | POA: Diagnosis not present

## 2021-10-24 DIAGNOSIS — F0390 Unspecified dementia without behavioral disturbance: Secondary | ICD-10-CM | POA: Diagnosis not present

## 2021-11-05 DIAGNOSIS — I872 Venous insufficiency (chronic) (peripheral): Secondary | ICD-10-CM | POA: Diagnosis not present

## 2021-11-05 DIAGNOSIS — F0390 Unspecified dementia without behavioral disturbance: Secondary | ICD-10-CM | POA: Diagnosis not present

## 2021-11-05 DIAGNOSIS — R54 Age-related physical debility: Secondary | ICD-10-CM | POA: Diagnosis not present

## 2021-11-05 DIAGNOSIS — J9611 Chronic respiratory failure with hypoxia: Secondary | ICD-10-CM | POA: Diagnosis not present

## 2021-11-06 DIAGNOSIS — R0902 Hypoxemia: Secondary | ICD-10-CM | POA: Diagnosis not present

## 2021-12-07 DIAGNOSIS — R0902 Hypoxemia: Secondary | ICD-10-CM | POA: Diagnosis not present

## 2021-12-09 DIAGNOSIS — F0390 Unspecified dementia without behavioral disturbance: Secondary | ICD-10-CM | POA: Diagnosis not present

## 2021-12-09 DIAGNOSIS — E785 Hyperlipidemia, unspecified: Secondary | ICD-10-CM | POA: Diagnosis not present

## 2021-12-09 DIAGNOSIS — I10 Essential (primary) hypertension: Secondary | ICD-10-CM | POA: Diagnosis not present

## 2021-12-09 DIAGNOSIS — R54 Age-related physical debility: Secondary | ICD-10-CM | POA: Diagnosis not present

## 2021-12-12 DIAGNOSIS — I10 Essential (primary) hypertension: Secondary | ICD-10-CM | POA: Diagnosis not present

## 2021-12-12 DIAGNOSIS — F0390 Unspecified dementia without behavioral disturbance: Secondary | ICD-10-CM | POA: Diagnosis not present

## 2021-12-12 DIAGNOSIS — R54 Age-related physical debility: Secondary | ICD-10-CM | POA: Diagnosis not present

## 2021-12-12 DIAGNOSIS — I872 Venous insufficiency (chronic) (peripheral): Secondary | ICD-10-CM | POA: Diagnosis not present

## 2022-01-07 DIAGNOSIS — R54 Age-related physical debility: Secondary | ICD-10-CM | POA: Diagnosis not present

## 2022-01-07 DIAGNOSIS — R0902 Hypoxemia: Secondary | ICD-10-CM | POA: Diagnosis not present

## 2022-01-07 DIAGNOSIS — Z9189 Other specified personal risk factors, not elsewhere classified: Secondary | ICD-10-CM | POA: Diagnosis not present

## 2022-01-07 DIAGNOSIS — I10 Essential (primary) hypertension: Secondary | ICD-10-CM | POA: Diagnosis not present

## 2022-02-06 DIAGNOSIS — Z8673 Personal history of transient ischemic attack (TIA), and cerebral infarction without residual deficits: Secondary | ICD-10-CM | POA: Diagnosis not present

## 2022-02-06 DIAGNOSIS — R54 Age-related physical debility: Secondary | ICD-10-CM | POA: Diagnosis not present

## 2022-02-06 DIAGNOSIS — R0902 Hypoxemia: Secondary | ICD-10-CM | POA: Diagnosis not present

## 2022-02-06 DIAGNOSIS — Z23 Encounter for immunization: Secondary | ICD-10-CM | POA: Diagnosis not present

## 2022-02-06 DIAGNOSIS — F0390 Unspecified dementia without behavioral disturbance: Secondary | ICD-10-CM | POA: Diagnosis not present

## 2022-02-26 DIAGNOSIS — G40909 Epilepsy, unspecified, not intractable, without status epilepticus: Secondary | ICD-10-CM | POA: Diagnosis not present

## 2022-02-26 DIAGNOSIS — F0390 Unspecified dementia without behavioral disturbance: Secondary | ICD-10-CM | POA: Diagnosis not present

## 2022-02-26 DIAGNOSIS — R54 Age-related physical debility: Secondary | ICD-10-CM | POA: Diagnosis not present

## 2022-02-26 DIAGNOSIS — I10 Essential (primary) hypertension: Secondary | ICD-10-CM | POA: Diagnosis not present

## 2022-03-13 DIAGNOSIS — K5909 Other constipation: Secondary | ICD-10-CM | POA: Diagnosis not present

## 2022-03-13 DIAGNOSIS — F0390 Unspecified dementia without behavioral disturbance: Secondary | ICD-10-CM | POA: Diagnosis not present

## 2022-03-13 DIAGNOSIS — R54 Age-related physical debility: Secondary | ICD-10-CM | POA: Diagnosis not present

## 2022-03-13 DIAGNOSIS — I10 Essential (primary) hypertension: Secondary | ICD-10-CM | POA: Diagnosis not present

## 2022-04-30 DIAGNOSIS — F0390 Unspecified dementia without behavioral disturbance: Secondary | ICD-10-CM | POA: Diagnosis not present

## 2022-05-01 DIAGNOSIS — I10 Essential (primary) hypertension: Secondary | ICD-10-CM | POA: Diagnosis not present

## 2022-05-01 DIAGNOSIS — R54 Age-related physical debility: Secondary | ICD-10-CM | POA: Diagnosis not present

## 2022-05-26 DIAGNOSIS — R54 Age-related physical debility: Secondary | ICD-10-CM | POA: Diagnosis not present

## 2022-05-26 DIAGNOSIS — F0390 Unspecified dementia without behavioral disturbance: Secondary | ICD-10-CM | POA: Diagnosis not present

## 2022-05-26 DIAGNOSIS — B379 Candidiasis, unspecified: Secondary | ICD-10-CM | POA: Diagnosis not present

## 2022-05-26 DIAGNOSIS — K5909 Other constipation: Secondary | ICD-10-CM | POA: Diagnosis not present

## 2022-05-26 DIAGNOSIS — I10 Essential (primary) hypertension: Secondary | ICD-10-CM | POA: Diagnosis not present

## 2022-06-04 ENCOUNTER — Other Ambulatory Visit: Payer: Self-pay | Admitting: Physician Assistant

## 2022-06-09 ENCOUNTER — Telehealth: Payer: Self-pay

## 2022-06-09 NOTE — Patient Outreach (Signed)
  Care Coordination   06/09/2022 Name: Heather [name] MRN: [id number] DOB: 03/04/1929   Care Coordination Outreach Attempts:  An unsuccessful telephone outreach was attempted today to offer the patient information about available care coordination services as a benefit of their health plan.   Follow Up Plan:  Additional outreach attempts will be made to offer the patient care coordination information and services.   Encounter Outcome:  No Answer   Care Coordination Interventions:  No, not indicated    Thea Silversmith, RN, MSN, BSN, [place] Coordinator [phone]

## 2022-06-25 DIAGNOSIS — F0390 Unspecified dementia without behavioral disturbance: Secondary | ICD-10-CM | POA: Diagnosis not present

## 2022-06-25 DIAGNOSIS — R531 Weakness: Secondary | ICD-10-CM | POA: Diagnosis not present

## 2022-06-25 DIAGNOSIS — K5909 Other constipation: Secondary | ICD-10-CM | POA: Diagnosis not present

## 2022-06-25 DIAGNOSIS — I10 Essential (primary) hypertension: Secondary | ICD-10-CM | POA: Diagnosis not present

## 2022-06-26 DIAGNOSIS — E785 Hyperlipidemia, unspecified: Secondary | ICD-10-CM | POA: Diagnosis not present

## 2022-06-26 DIAGNOSIS — G40909 Epilepsy, unspecified, not intractable, without status epilepticus: Secondary | ICD-10-CM | POA: Diagnosis not present

## 2022-06-26 DIAGNOSIS — F0390 Unspecified dementia without behavioral disturbance: Secondary | ICD-10-CM | POA: Diagnosis not present

## 2022-06-26 DIAGNOSIS — I10 Essential (primary) hypertension: Secondary | ICD-10-CM | POA: Diagnosis not present

## 2022-06-30 DIAGNOSIS — M8580 Other specified disorders of bone density and structure, unspecified site: Secondary | ICD-10-CM | POA: Diagnosis not present

## 2022-06-30 DIAGNOSIS — K219 Gastro-esophageal reflux disease without esophagitis: Secondary | ICD-10-CM | POA: Diagnosis not present

## 2022-06-30 DIAGNOSIS — Z7901 Long term (current) use of anticoagulants: Secondary | ICD-10-CM | POA: Diagnosis not present

## 2022-06-30 DIAGNOSIS — N183 Chronic kidney disease, stage 3 unspecified: Secondary | ICD-10-CM | POA: Diagnosis not present

## 2022-06-30 DIAGNOSIS — I69351 Hemiplegia and hemiparesis following cerebral infarction affecting right dominant side: Secondary | ICD-10-CM | POA: Diagnosis not present

## 2022-06-30 DIAGNOSIS — K5909 Other constipation: Secondary | ICD-10-CM | POA: Diagnosis not present

## 2022-06-30 DIAGNOSIS — J309 Allergic rhinitis, unspecified: Secondary | ICD-10-CM | POA: Diagnosis not present

## 2022-06-30 DIAGNOSIS — Z556 Problems related to health literacy: Secondary | ICD-10-CM | POA: Diagnosis not present

## 2022-06-30 DIAGNOSIS — M109 Gout, unspecified: Secondary | ICD-10-CM | POA: Diagnosis not present

## 2022-06-30 DIAGNOSIS — J9690 Respiratory failure, unspecified, unspecified whether with hypoxia or hypercapnia: Secondary | ICD-10-CM | POA: Diagnosis not present

## 2022-06-30 DIAGNOSIS — I872 Venous insufficiency (chronic) (peripheral): Secondary | ICD-10-CM | POA: Diagnosis not present

## 2022-06-30 DIAGNOSIS — F02A0 Dementia in other diseases classified elsewhere, mild, without behavioral disturbance, psychotic disturbance, mood disturbance, and anxiety: Secondary | ICD-10-CM | POA: Diagnosis not present

## 2022-06-30 DIAGNOSIS — E785 Hyperlipidemia, unspecified: Secondary | ICD-10-CM | POA: Diagnosis not present

## 2022-06-30 DIAGNOSIS — I129 Hypertensive chronic kidney disease with stage 1 through stage 4 chronic kidney disease, or unspecified chronic kidney disease: Secondary | ICD-10-CM | POA: Diagnosis not present

## 2022-06-30 DIAGNOSIS — G40909 Epilepsy, unspecified, not intractable, without status epilepticus: Secondary | ICD-10-CM | POA: Diagnosis not present

## 2022-06-30 DIAGNOSIS — I69354 Hemiplegia and hemiparesis following cerebral infarction affecting left non-dominant side: Secondary | ICD-10-CM | POA: Diagnosis not present

## 2022-06-30 DIAGNOSIS — B379 Candidiasis, unspecified: Secondary | ICD-10-CM | POA: Diagnosis not present

## 2022-07-08 DIAGNOSIS — G40909 Epilepsy, unspecified, not intractable, without status epilepticus: Secondary | ICD-10-CM | POA: Diagnosis not present

## 2022-07-08 DIAGNOSIS — I69351 Hemiplegia and hemiparesis following cerebral infarction affecting right dominant side: Secondary | ICD-10-CM | POA: Diagnosis not present

## 2022-07-08 DIAGNOSIS — J309 Allergic rhinitis, unspecified: Secondary | ICD-10-CM | POA: Diagnosis not present

## 2022-07-08 DIAGNOSIS — M8580 Other specified disorders of bone density and structure, unspecified site: Secondary | ICD-10-CM | POA: Diagnosis not present

## 2022-07-08 DIAGNOSIS — B379 Candidiasis, unspecified: Secondary | ICD-10-CM | POA: Diagnosis not present

## 2022-07-08 DIAGNOSIS — I69354 Hemiplegia and hemiparesis following cerebral infarction affecting left non-dominant side: Secondary | ICD-10-CM | POA: Diagnosis not present

## 2022-07-08 DIAGNOSIS — K219 Gastro-esophageal reflux disease without esophagitis: Secondary | ICD-10-CM | POA: Diagnosis not present

## 2022-07-08 DIAGNOSIS — K5909 Other constipation: Secondary | ICD-10-CM | POA: Diagnosis not present

## 2022-07-08 DIAGNOSIS — I129 Hypertensive chronic kidney disease with stage 1 through stage 4 chronic kidney disease, or unspecified chronic kidney disease: Secondary | ICD-10-CM | POA: Diagnosis not present

## 2022-07-08 DIAGNOSIS — Z7901 Long term (current) use of anticoagulants: Secondary | ICD-10-CM | POA: Diagnosis not present

## 2022-07-08 DIAGNOSIS — I872 Venous insufficiency (chronic) (peripheral): Secondary | ICD-10-CM | POA: Diagnosis not present

## 2022-07-08 DIAGNOSIS — J9690 Respiratory failure, unspecified, unspecified whether with hypoxia or hypercapnia: Secondary | ICD-10-CM | POA: Diagnosis not present

## 2022-07-08 DIAGNOSIS — E785 Hyperlipidemia, unspecified: Secondary | ICD-10-CM | POA: Diagnosis not present

## 2022-07-08 DIAGNOSIS — M109 Gout, unspecified: Secondary | ICD-10-CM | POA: Diagnosis not present

## 2022-07-08 DIAGNOSIS — N183 Chronic kidney disease, stage 3 unspecified: Secondary | ICD-10-CM | POA: Diagnosis not present

## 2022-07-08 DIAGNOSIS — F02A0 Dementia in other diseases classified elsewhere, mild, without behavioral disturbance, psychotic disturbance, mood disturbance, and anxiety: Secondary | ICD-10-CM | POA: Diagnosis not present

## 2022-07-15 DIAGNOSIS — M109 Gout, unspecified: Secondary | ICD-10-CM | POA: Diagnosis not present

## 2022-07-15 DIAGNOSIS — I69351 Hemiplegia and hemiparesis following cerebral infarction affecting right dominant side: Secondary | ICD-10-CM | POA: Diagnosis not present

## 2022-07-15 DIAGNOSIS — B379 Candidiasis, unspecified: Secondary | ICD-10-CM | POA: Diagnosis not present

## 2022-07-15 DIAGNOSIS — K5909 Other constipation: Secondary | ICD-10-CM | POA: Diagnosis not present

## 2022-07-15 DIAGNOSIS — M8580 Other specified disorders of bone density and structure, unspecified site: Secondary | ICD-10-CM | POA: Diagnosis not present

## 2022-07-15 DIAGNOSIS — J9690 Respiratory failure, unspecified, unspecified whether with hypoxia or hypercapnia: Secondary | ICD-10-CM | POA: Diagnosis not present

## 2022-07-15 DIAGNOSIS — N183 Chronic kidney disease, stage 3 unspecified: Secondary | ICD-10-CM | POA: Diagnosis not present

## 2022-07-15 DIAGNOSIS — J309 Allergic rhinitis, unspecified: Secondary | ICD-10-CM | POA: Diagnosis not present

## 2022-07-15 DIAGNOSIS — F02A0 Dementia in other diseases classified elsewhere, mild, without behavioral disturbance, psychotic disturbance, mood disturbance, and anxiety: Secondary | ICD-10-CM | POA: Diagnosis not present

## 2022-07-15 DIAGNOSIS — I69354 Hemiplegia and hemiparesis following cerebral infarction affecting left non-dominant side: Secondary | ICD-10-CM | POA: Diagnosis not present

## 2022-07-15 DIAGNOSIS — K219 Gastro-esophageal reflux disease without esophagitis: Secondary | ICD-10-CM | POA: Diagnosis not present

## 2022-07-15 DIAGNOSIS — I872 Venous insufficiency (chronic) (peripheral): Secondary | ICD-10-CM | POA: Diagnosis not present

## 2022-07-15 DIAGNOSIS — E785 Hyperlipidemia, unspecified: Secondary | ICD-10-CM | POA: Diagnosis not present

## 2022-07-15 DIAGNOSIS — Z7901 Long term (current) use of anticoagulants: Secondary | ICD-10-CM | POA: Diagnosis not present

## 2022-07-15 DIAGNOSIS — G40909 Epilepsy, unspecified, not intractable, without status epilepticus: Secondary | ICD-10-CM | POA: Diagnosis not present

## 2022-07-15 DIAGNOSIS — I129 Hypertensive chronic kidney disease with stage 1 through stage 4 chronic kidney disease, or unspecified chronic kidney disease: Secondary | ICD-10-CM | POA: Diagnosis not present

## 2022-07-22 DIAGNOSIS — F02A0 Dementia in other diseases classified elsewhere, mild, without behavioral disturbance, psychotic disturbance, mood disturbance, and anxiety: Secondary | ICD-10-CM | POA: Diagnosis not present

## 2022-07-22 DIAGNOSIS — B379 Candidiasis, unspecified: Secondary | ICD-10-CM | POA: Diagnosis not present

## 2022-07-22 DIAGNOSIS — J9690 Respiratory failure, unspecified, unspecified whether with hypoxia or hypercapnia: Secondary | ICD-10-CM | POA: Diagnosis not present

## 2022-07-22 DIAGNOSIS — E785 Hyperlipidemia, unspecified: Secondary | ICD-10-CM | POA: Diagnosis not present

## 2022-07-22 DIAGNOSIS — N183 Chronic kidney disease, stage 3 unspecified: Secondary | ICD-10-CM | POA: Diagnosis not present

## 2022-07-22 DIAGNOSIS — Z7901 Long term (current) use of anticoagulants: Secondary | ICD-10-CM | POA: Diagnosis not present

## 2022-07-22 DIAGNOSIS — M109 Gout, unspecified: Secondary | ICD-10-CM | POA: Diagnosis not present

## 2022-07-22 DIAGNOSIS — I872 Venous insufficiency (chronic) (peripheral): Secondary | ICD-10-CM | POA: Diagnosis not present

## 2022-07-22 DIAGNOSIS — K5909 Other constipation: Secondary | ICD-10-CM | POA: Diagnosis not present

## 2022-07-22 DIAGNOSIS — I69351 Hemiplegia and hemiparesis following cerebral infarction affecting right dominant side: Secondary | ICD-10-CM | POA: Diagnosis not present

## 2022-07-22 DIAGNOSIS — M8580 Other specified disorders of bone density and structure, unspecified site: Secondary | ICD-10-CM | POA: Diagnosis not present

## 2022-07-22 DIAGNOSIS — J309 Allergic rhinitis, unspecified: Secondary | ICD-10-CM | POA: Diagnosis not present

## 2022-07-22 DIAGNOSIS — K219 Gastro-esophageal reflux disease without esophagitis: Secondary | ICD-10-CM | POA: Diagnosis not present

## 2022-07-22 DIAGNOSIS — I129 Hypertensive chronic kidney disease with stage 1 through stage 4 chronic kidney disease, or unspecified chronic kidney disease: Secondary | ICD-10-CM | POA: Diagnosis not present

## 2022-07-22 DIAGNOSIS — I69354 Hemiplegia and hemiparesis following cerebral infarction affecting left non-dominant side: Secondary | ICD-10-CM | POA: Diagnosis not present

## 2022-07-22 DIAGNOSIS — G40909 Epilepsy, unspecified, not intractable, without status epilepticus: Secondary | ICD-10-CM | POA: Diagnosis not present

## 2022-07-24 DIAGNOSIS — R531 Weakness: Secondary | ICD-10-CM | POA: Diagnosis not present

## 2022-07-27 DIAGNOSIS — F02A0 Dementia in other diseases classified elsewhere, mild, without behavioral disturbance, psychotic disturbance, mood disturbance, and anxiety: Secondary | ICD-10-CM | POA: Diagnosis not present

## 2022-07-27 DIAGNOSIS — M109 Gout, unspecified: Secondary | ICD-10-CM | POA: Diagnosis not present

## 2022-07-27 DIAGNOSIS — K219 Gastro-esophageal reflux disease without esophagitis: Secondary | ICD-10-CM | POA: Diagnosis not present

## 2022-07-27 DIAGNOSIS — Z7901 Long term (current) use of anticoagulants: Secondary | ICD-10-CM | POA: Diagnosis not present

## 2022-07-27 DIAGNOSIS — J309 Allergic rhinitis, unspecified: Secondary | ICD-10-CM | POA: Diagnosis not present

## 2022-07-27 DIAGNOSIS — M8580 Other specified disorders of bone density and structure, unspecified site: Secondary | ICD-10-CM | POA: Diagnosis not present

## 2022-07-27 DIAGNOSIS — I69351 Hemiplegia and hemiparesis following cerebral infarction affecting right dominant side: Secondary | ICD-10-CM | POA: Diagnosis not present

## 2022-07-27 DIAGNOSIS — I69354 Hemiplegia and hemiparesis following cerebral infarction affecting left non-dominant side: Secondary | ICD-10-CM | POA: Diagnosis not present

## 2022-07-27 DIAGNOSIS — I872 Venous insufficiency (chronic) (peripheral): Secondary | ICD-10-CM | POA: Diagnosis not present

## 2022-07-27 DIAGNOSIS — G40909 Epilepsy, unspecified, not intractable, without status epilepticus: Secondary | ICD-10-CM | POA: Diagnosis not present

## 2022-07-27 DIAGNOSIS — N183 Chronic kidney disease, stage 3 unspecified: Secondary | ICD-10-CM | POA: Diagnosis not present

## 2022-07-27 DIAGNOSIS — E785 Hyperlipidemia, unspecified: Secondary | ICD-10-CM | POA: Diagnosis not present

## 2022-07-27 DIAGNOSIS — J9690 Respiratory failure, unspecified, unspecified whether with hypoxia or hypercapnia: Secondary | ICD-10-CM | POA: Diagnosis not present

## 2022-07-27 DIAGNOSIS — I129 Hypertensive chronic kidney disease with stage 1 through stage 4 chronic kidney disease, or unspecified chronic kidney disease: Secondary | ICD-10-CM | POA: Diagnosis not present

## 2022-07-27 DIAGNOSIS — K5909 Other constipation: Secondary | ICD-10-CM | POA: Diagnosis not present

## 2022-07-27 DIAGNOSIS — B379 Candidiasis, unspecified: Secondary | ICD-10-CM | POA: Diagnosis not present

## 2022-07-30 DIAGNOSIS — K5909 Other constipation: Secondary | ICD-10-CM | POA: Diagnosis not present

## 2022-07-30 DIAGNOSIS — J309 Allergic rhinitis, unspecified: Secondary | ICD-10-CM | POA: Diagnosis not present

## 2022-07-30 DIAGNOSIS — I129 Hypertensive chronic kidney disease with stage 1 through stage 4 chronic kidney disease, or unspecified chronic kidney disease: Secondary | ICD-10-CM | POA: Diagnosis not present

## 2022-07-30 DIAGNOSIS — F02A0 Dementia in other diseases classified elsewhere, mild, without behavioral disturbance, psychotic disturbance, mood disturbance, and anxiety: Secondary | ICD-10-CM | POA: Diagnosis not present

## 2022-07-30 DIAGNOSIS — E785 Hyperlipidemia, unspecified: Secondary | ICD-10-CM | POA: Diagnosis not present

## 2022-07-30 DIAGNOSIS — J9690 Respiratory failure, unspecified, unspecified whether with hypoxia or hypercapnia: Secondary | ICD-10-CM | POA: Diagnosis not present

## 2022-07-30 DIAGNOSIS — Z556 Problems related to health literacy: Secondary | ICD-10-CM | POA: Diagnosis not present

## 2022-07-30 DIAGNOSIS — I872 Venous insufficiency (chronic) (peripheral): Secondary | ICD-10-CM | POA: Diagnosis not present

## 2022-07-30 DIAGNOSIS — M109 Gout, unspecified: Secondary | ICD-10-CM | POA: Diagnosis not present

## 2022-07-30 DIAGNOSIS — N183 Chronic kidney disease, stage 3 unspecified: Secondary | ICD-10-CM | POA: Diagnosis not present

## 2022-07-30 DIAGNOSIS — I69351 Hemiplegia and hemiparesis following cerebral infarction affecting right dominant side: Secondary | ICD-10-CM | POA: Diagnosis not present

## 2022-07-30 DIAGNOSIS — K219 Gastro-esophageal reflux disease without esophagitis: Secondary | ICD-10-CM | POA: Diagnosis not present

## 2022-07-30 DIAGNOSIS — G40909 Epilepsy, unspecified, not intractable, without status epilepticus: Secondary | ICD-10-CM | POA: Diagnosis not present

## 2022-07-30 DIAGNOSIS — I69354 Hemiplegia and hemiparesis following cerebral infarction affecting left non-dominant side: Secondary | ICD-10-CM | POA: Diagnosis not present

## 2022-07-30 DIAGNOSIS — B379 Candidiasis, unspecified: Secondary | ICD-10-CM | POA: Diagnosis not present

## 2022-07-30 DIAGNOSIS — Z7901 Long term (current) use of anticoagulants: Secondary | ICD-10-CM | POA: Diagnosis not present

## 2022-07-30 DIAGNOSIS — M8580 Other specified disorders of bone density and structure, unspecified site: Secondary | ICD-10-CM | POA: Diagnosis not present

## 2022-07-31 DIAGNOSIS — I5032 Chronic diastolic (congestive) heart failure: Secondary | ICD-10-CM | POA: Diagnosis not present

## 2022-07-31 DIAGNOSIS — Z8673 Personal history of transient ischemic attack (TIA), and cerebral infarction without residual deficits: Secondary | ICD-10-CM | POA: Diagnosis not present

## 2022-07-31 DIAGNOSIS — F0390 Unspecified dementia without behavioral disturbance: Secondary | ICD-10-CM | POA: Diagnosis not present

## 2022-07-31 DIAGNOSIS — J9611 Chronic respiratory failure with hypoxia: Secondary | ICD-10-CM | POA: Diagnosis not present

## 2022-08-05 ENCOUNTER — Other Ambulatory Visit: Payer: Self-pay | Admitting: Physician Assistant

## 2022-08-05 DIAGNOSIS — M109 Gout, unspecified: Secondary | ICD-10-CM | POA: Diagnosis not present

## 2022-08-05 DIAGNOSIS — I69351 Hemiplegia and hemiparesis following cerebral infarction affecting right dominant side: Secondary | ICD-10-CM | POA: Diagnosis not present

## 2022-08-05 DIAGNOSIS — K219 Gastro-esophageal reflux disease without esophagitis: Secondary | ICD-10-CM | POA: Diagnosis not present

## 2022-08-05 DIAGNOSIS — B379 Candidiasis, unspecified: Secondary | ICD-10-CM | POA: Diagnosis not present

## 2022-08-05 DIAGNOSIS — E785 Hyperlipidemia, unspecified: Secondary | ICD-10-CM | POA: Diagnosis not present

## 2022-08-05 DIAGNOSIS — F02A0 Dementia in other diseases classified elsewhere, mild, without behavioral disturbance, psychotic disturbance, mood disturbance, and anxiety: Secondary | ICD-10-CM | POA: Diagnosis not present

## 2022-08-05 DIAGNOSIS — G40909 Epilepsy, unspecified, not intractable, without status epilepticus: Secondary | ICD-10-CM | POA: Diagnosis not present

## 2022-08-05 DIAGNOSIS — N183 Chronic kidney disease, stage 3 unspecified: Secondary | ICD-10-CM | POA: Diagnosis not present

## 2022-08-05 DIAGNOSIS — I69354 Hemiplegia and hemiparesis following cerebral infarction affecting left non-dominant side: Secondary | ICD-10-CM | POA: Diagnosis not present

## 2022-08-05 DIAGNOSIS — Z7901 Long term (current) use of anticoagulants: Secondary | ICD-10-CM | POA: Diagnosis not present

## 2022-08-05 DIAGNOSIS — I129 Hypertensive chronic kidney disease with stage 1 through stage 4 chronic kidney disease, or unspecified chronic kidney disease: Secondary | ICD-10-CM | POA: Diagnosis not present

## 2022-08-05 DIAGNOSIS — K5909 Other constipation: Secondary | ICD-10-CM | POA: Diagnosis not present

## 2022-08-05 DIAGNOSIS — M8580 Other specified disorders of bone density and structure, unspecified site: Secondary | ICD-10-CM | POA: Diagnosis not present

## 2022-08-05 DIAGNOSIS — I872 Venous insufficiency (chronic) (peripheral): Secondary | ICD-10-CM | POA: Diagnosis not present

## 2022-08-05 DIAGNOSIS — J309 Allergic rhinitis, unspecified: Secondary | ICD-10-CM | POA: Diagnosis not present

## 2022-08-05 DIAGNOSIS — J9690 Respiratory failure, unspecified, unspecified whether with hypoxia or hypercapnia: Secondary | ICD-10-CM | POA: Diagnosis not present

## 2022-08-11 DIAGNOSIS — I10 Essential (primary) hypertension: Secondary | ICD-10-CM | POA: Diagnosis not present

## 2022-08-12 DIAGNOSIS — M109 Gout, unspecified: Secondary | ICD-10-CM | POA: Diagnosis not present

## 2022-08-12 DIAGNOSIS — G40909 Epilepsy, unspecified, not intractable, without status epilepticus: Secondary | ICD-10-CM | POA: Diagnosis not present

## 2022-08-12 DIAGNOSIS — J9690 Respiratory failure, unspecified, unspecified whether with hypoxia or hypercapnia: Secondary | ICD-10-CM | POA: Diagnosis not present

## 2022-08-12 DIAGNOSIS — K5909 Other constipation: Secondary | ICD-10-CM | POA: Diagnosis not present

## 2022-08-12 DIAGNOSIS — J309 Allergic rhinitis, unspecified: Secondary | ICD-10-CM | POA: Diagnosis not present

## 2022-08-12 DIAGNOSIS — I69351 Hemiplegia and hemiparesis following cerebral infarction affecting right dominant side: Secondary | ICD-10-CM | POA: Diagnosis not present

## 2022-08-12 DIAGNOSIS — K219 Gastro-esophageal reflux disease without esophagitis: Secondary | ICD-10-CM | POA: Diagnosis not present

## 2022-08-12 DIAGNOSIS — E785 Hyperlipidemia, unspecified: Secondary | ICD-10-CM | POA: Diagnosis not present

## 2022-08-12 DIAGNOSIS — F02A0 Dementia in other diseases classified elsewhere, mild, without behavioral disturbance, psychotic disturbance, mood disturbance, and anxiety: Secondary | ICD-10-CM | POA: Diagnosis not present

## 2022-08-12 DIAGNOSIS — N183 Chronic kidney disease, stage 3 unspecified: Secondary | ICD-10-CM | POA: Diagnosis not present

## 2022-08-12 DIAGNOSIS — I129 Hypertensive chronic kidney disease with stage 1 through stage 4 chronic kidney disease, or unspecified chronic kidney disease: Secondary | ICD-10-CM | POA: Diagnosis not present

## 2022-08-12 DIAGNOSIS — B379 Candidiasis, unspecified: Secondary | ICD-10-CM | POA: Diagnosis not present

## 2022-08-12 DIAGNOSIS — I872 Venous insufficiency (chronic) (peripheral): Secondary | ICD-10-CM | POA: Diagnosis not present

## 2022-08-12 DIAGNOSIS — Z7901 Long term (current) use of anticoagulants: Secondary | ICD-10-CM | POA: Diagnosis not present

## 2022-08-12 DIAGNOSIS — I69354 Hemiplegia and hemiparesis following cerebral infarction affecting left non-dominant side: Secondary | ICD-10-CM | POA: Diagnosis not present

## 2022-08-12 DIAGNOSIS — M8580 Other specified disorders of bone density and structure, unspecified site: Secondary | ICD-10-CM | POA: Diagnosis not present

## 2022-08-17 ENCOUNTER — Telehealth: Payer: Medicare Other | Admitting: Neurology

## 2022-08-17 ENCOUNTER — Encounter: Payer: Self-pay | Admitting: Neurology

## 2022-08-17 VITALS — Ht 61.5 in | Wt 170.0 lb

## 2022-08-17 DIAGNOSIS — F01C3 Vascular dementia, severe, with mood disturbance: Secondary | ICD-10-CM | POA: Diagnosis not present

## 2022-08-17 DIAGNOSIS — J309 Allergic rhinitis, unspecified: Secondary | ICD-10-CM | POA: Diagnosis not present

## 2022-08-17 DIAGNOSIS — N183 Chronic kidney disease, stage 3 unspecified: Secondary | ICD-10-CM | POA: Diagnosis not present

## 2022-08-17 DIAGNOSIS — G40109 Localization-related (focal) (partial) symptomatic epilepsy and epileptic syndromes with simple partial seizures, not intractable, without status epilepticus: Secondary | ICD-10-CM

## 2022-08-17 DIAGNOSIS — E785 Hyperlipidemia, unspecified: Secondary | ICD-10-CM | POA: Diagnosis not present

## 2022-08-17 DIAGNOSIS — K219 Gastro-esophageal reflux disease without esophagitis: Secondary | ICD-10-CM | POA: Diagnosis not present

## 2022-08-17 DIAGNOSIS — I69351 Hemiplegia and hemiparesis following cerebral infarction affecting right dominant side: Secondary | ICD-10-CM | POA: Diagnosis not present

## 2022-08-17 DIAGNOSIS — M109 Gout, unspecified: Secondary | ICD-10-CM | POA: Diagnosis not present

## 2022-08-17 DIAGNOSIS — I69354 Hemiplegia and hemiparesis following cerebral infarction affecting left non-dominant side: Secondary | ICD-10-CM | POA: Diagnosis not present

## 2022-08-17 DIAGNOSIS — Z7901 Long term (current) use of anticoagulants: Secondary | ICD-10-CM | POA: Diagnosis not present

## 2022-08-17 DIAGNOSIS — G40909 Epilepsy, unspecified, not intractable, without status epilepticus: Secondary | ICD-10-CM | POA: Diagnosis not present

## 2022-08-17 DIAGNOSIS — J9690 Respiratory failure, unspecified, unspecified whether with hypoxia or hypercapnia: Secondary | ICD-10-CM | POA: Diagnosis not present

## 2022-08-17 DIAGNOSIS — I872 Venous insufficiency (chronic) (peripheral): Secondary | ICD-10-CM | POA: Diagnosis not present

## 2022-08-17 DIAGNOSIS — K5909 Other constipation: Secondary | ICD-10-CM | POA: Diagnosis not present

## 2022-08-17 DIAGNOSIS — M8580 Other specified disorders of bone density and structure, unspecified site: Secondary | ICD-10-CM | POA: Diagnosis not present

## 2022-08-17 DIAGNOSIS — I129 Hypertensive chronic kidney disease with stage 1 through stage 4 chronic kidney disease, or unspecified chronic kidney disease: Secondary | ICD-10-CM | POA: Diagnosis not present

## 2022-08-17 DIAGNOSIS — B379 Candidiasis, unspecified: Secondary | ICD-10-CM | POA: Diagnosis not present

## 2022-08-17 DIAGNOSIS — F02A0 Dementia in other diseases classified elsewhere, mild, without behavioral disturbance, psychotic disturbance, mood disturbance, and anxiety: Secondary | ICD-10-CM | POA: Diagnosis not present

## 2022-08-17 MED ORDER — TRAZODONE HCL 50 MG PO TABS
50.0000 mg | ORAL_TABLET | Freq: Every day | ORAL | 3 refills | Status: DC
Start: 2022-08-17 — End: 2023-08-03

## 2022-08-17 MED ORDER — DONEPEZIL HCL 10 MG PO TABS: 10.0000 mg | ORAL_TABLET | Freq: Every day | ORAL | 3 refills | Status: DC

## 2022-08-17 MED ORDER — LAMOTRIGINE 25 MG PO TABS: ORAL_TABLET | ORAL | 3 refills | Status: DC

## 2022-08-17 MED ORDER — QUETIAPINE FUMARATE 25 MG PO TABS: 25.0000 mg | ORAL_TABLET | Freq: Every day | ORAL | 3 refills | Status: DC

## 2022-08-17 NOTE — Progress Notes (Signed)
Virtual Visit via Video Note The purpose of this virtual visit is to provide medical care while limiting exposure to the novel coronavirus.    Consent was obtained for video visit:  Yes.   Answered questions that patient had about telehealth interaction:  Yes.    Pt location: Home Physician Location: office Name of referring provider:  Corwin Levins, MD I connected with Estill Dooms at patients initiation/request on 08/17/2022 at  2:00 PM EDT by video enabled telemedicine application and verified that I am speaking with the correct person using two identifiers. Pt MRN:  [id number] Pt DOB:  [birth year] Video Participants:  Estill Dooms;  Raliegh Scarlet (daughter)   History of Present Illness:  The patient had a virtual video visit on 08/17/2022. She was last seen by Memory Disorders PA Marlowe Kays 9 months ago. She has severe dementia and has been bedbound. She was hospitalized in May 2023 for somnolence with left gaze deviation. EEG showed lateralized periodic discharges with overriding fast activity (LPD+F) on the right hemisphere, diffuse slowing and additional right hemisphere slowing. Head CT no acute changes, there were foci of chronic encephalomalacia/gliosis within the anteroinferior frontal lobes bilaterally, irregular hyperdense and partially calcified material along the anteroinferior right frontal lobe, which may reflec sequela of the reported prior skull base cephalocele repair. She has been on Lamotrigine 50mg  BID with no further seizures since 08/2021. Stanton Kidney reports she has been more awake since taking the LTG, she is awake most of the day and not as drowsy like before. She sleeps well at night with Trazodone 50mg  qhs. No behavioral changes. She is on Donepezil 10mg  daily and Quetiapine 25mg  daily without side effects. Stanton Kidney reports she is doing pretty good, she has not had any complaints. She sleeps well. She feeds herself half of her food, then they feed her the second half. She was on  her wheelchair 2 weeks ago and knocked a bowl of food on her leg, she had a quarter sized burn on her left thigh that is resolving. She is now under [facility] and has a visiting nurse and physical therapy.    History on Initial Assessment 03/09/2016: This is an 89 yo RH woman with a history of hypertension, hyperlipidemia, encephalocele s/p surgery in 2003, with memory loss and stroke in October 2017. She had seen her PCP on 01/01/16 for memory problems. An MRI brain was ordered, however while waiting for the scan, she started having shortness of breath and was admitted 01/24/16 and found to have DVTs in both lower extremities. She was also complaining of generalized weakness with difficulty ambulating. She was discharged home on Xarelto and home PT. She went for outpatient previously scheduled MRI brain on 02/03/16, which I personally reviewed, showing an acute/subacute small left periatrial infarct without associated hemorrhage. There was a remote moderate-sized right parietal lobe infarct. There was marked chronic microvascular change. Remote frontal craniotomy for repair of anterior encephalocele was seen, with significant surrounding encephalomalacia. There was moderate global atrophy without hydrocephalus. She was instructed to go to the ER, but since she had recent echocardiogram and was started on Xarelto, and had no clear deficits on exam, and she was discharged home with outpatient follow-up.    She reports that her memory has been "on and off" for conversations. She can hold a conversation then just "slacks off." Her daughter has noticed this and feels that she loses interest, cutting off conversations. She endorses some word-finding difficulties. Her daughter states that sometimes  it is hard to say about her memory because of her "quirky sense of humor." For instance, this morning she said she did not want breakfast. After her daughter came home, she told her she was hungry, so her daughter fixed  food. Once made, she stated she did not want food. She only repeats herself when they are having conversations in the bathroom at 3am. She had previously been living with her husband until hospital admission, and has been living with her daughter for the past 2 months. She denied any missed bill payments when she was living with her husband. She is pretty good with remembering to take her morning medications. She stopped driving due to mobility issues 3 years ago. She has been using a wheelchair since recent hospital discharge, however her daughter reports that for the past 2 years, she has not walked a lot because of bilateral leg weakness. She would use a walker and hold on to furniture. She feels her left arm and leg are weaker. She has occasional tingling in both hands. She needs assistance with dressing and bathing. Her daughter reports a couple of falls in the past 2 years, she would tell her daughter about them 1-2 weeks later. She denies any prior history of stroke. She denies any headaches, dizziness, diplopia, dysarthria/dysphagia. No personality changes or hallucinations. She is having some constipation, but her bigger issue is urinary urgency. She has the urge to urinate 4 to 6 times an hour at night, but once she sits on the commode, she does not urinate. During the day, she has the urge around 3 times an hour.       Current Outpatient Medications on File Prior to Visit  Medication Sig Dispense Refill   amLODipine (NORVASC) 10 MG tablet Take 10 mg by mouth daily.     Dextromethorphan-guaiFENesin (MUCINEX DM) 30-600 MG TB12 Take 1 tablet by mouth every 12 (twelve) hours as needed for cough or congestion.     donepezil (ARICEPT) 10 MG tablet Take 1 tablet (10 mg total) by mouth daily. 90 tablet 3   EQ ALLERGY RELIEF, CETIRIZINE, 10 MG tablet Take 1 tablet by mouth once daily (Patient taking differently: Take 10 mg by mouth daily.) 90 tablet 3   lamoTRIgine (LAMICTAL) 25 MG tablet TAKE 1 TABLET  BY MOUTH TWICE DAILY FOR 14 DAYS THEN INCREASE  TO  2  TABLETS  TWICE  A  DAY 240 tablet 0   loperamide (IMODIUM) 2 MG capsule Take 2 mg by mouth daily as needed for diarrhea or loose stools.     montelukast (SINGULAIR) 10 MG tablet Take 1 tablet by mouth once daily (Patient taking differently: Take 10 mg by mouth at bedtime.) 90 tablet 3   pantoprazole (PROTONIX) 40 MG tablet Take 1 tablet (40 mg total) by mouth 2 (two) times daily. (Patient taking differently: Take 1 tablet by mouth daily.) 90 tablet 0   QUEtiapine (SEROQUEL) 25 MG tablet Take 1 tablet (25 mg total) by mouth at bedtime. 90 tablet 3   traZODone (DESYREL) 50 MG tablet TAKE 1/2 TO 1 (ONE-HALF TO ONE) TABLET BY MOUTH AT BEDTIME AS NEEDED FOR SLEEP (Patient taking differently: Take 25-50 mg by mouth at bedtime as needed for sleep.) 90 tablet 1   XARELTO 10 MG TABS tablet Take 10 mg by mouth at bedtime.     [DISCONTINUED] apixaban (ELIQUIS) 5 MG TABS tablet Take 1 tablet (5 mg total) by mouth 2 (two) times daily. (Patient not taking: Reported on  04/03/2020) 60 tablet 11   [DISCONTINUED] levothyroxine (SYNTHROID, LEVOTHROID) 25 MCG tablet Take 0.5 tablets (12.5 mcg total) by mouth daily before breakfast. (Patient not taking: Reported on 04/03/2020) 30 tablet 0   No current facility-administered medications on file prior to visit.     Observations/Objective:   Vitals:   08/17/22 1240  Weight: 170 lb (77.1 kg)  Height: 5' 1.5" (1.562 m)   GEN:  The patient appears stated age and is in NAD supine on hospital bed  Neurological examination: Patient is awake, alert, able to say one or two words ("hello"), difficulty following instructions, needs repeat instructions and was able to lift arms and wiggle toes, waves to camera. No facial asymmetry. Motor: moves all extremities symmetrically, at least anti-gravity x 4.    Assessment and Plan:   This is a 89 yo RH woman with a history of hypertension, hyperlipidemia, right MCA stroke, s/p  anterior encephalocele surgery, severe dementia with new onset seizure in May 2023. EEG showed lateralized periodic discharges over the right hemisphere. She has been seizure-free since 08/2021 on Lamotrigine 50mg  BID. She requires total care for her dementia with excellent care at home. Continue Donepezil 10mg  daily, Quetiapine 25mg  daily, and Trazodone 50mg  qhs. Continue 24/7 care. Follow-up in 1 year, call for any changes.   Follow Up Instructions:   -I discussed the assessment and treatment plan with the patient's daughter. The patient was provided an opportunity to ask questions and all were answered. The patient's daughter agreed with the plan and demonstrated an understanding of the instructions.   The patient's daughter was advised to call back or seek an in-person evaluation if the symptoms worsen or if the condition fails to improve as anticipated.      Van Clines, MD

## 2022-08-17 NOTE — Patient Instructions (Signed)
Good to see you. Continue all medications. Follow-up in 1 year, call for any changes.   Seizure Precautions: 1. If medication has been prescribed for you to prevent seizures, take it exactly as directed.  Do not stop taking the medicine without talking to your doctor first, even if you have not had a seizure in a long time.   2. Avoid activities in which a seizure would cause danger to yourself or to others.  Don't operate dangerous machinery, swim alone, or climb in high or dangerous places, such as on ladders, roofs, or girders.  Do not drive unless your doctor says you may.  3. If you have any warning that you may have a seizure, lay down in a safe place where you can't hurt yourself.    4.  No driving for 6 months from last seizure, as per [facility].   Please refer to the following link on the [employer]'s website for more information: http://www.epilepsyfoundation.org/answerplace/Social/driving/drivingu.cfm   5.  Maintain good sleep hygiene.  6.  Contact your doctor if you have any problems that may be related to the medicine you are taking.  7.  Call 911 and bring the patient back to the ED if:        A.  The seizure lasts longer than 5 minutes.       B.  The patient doesn't awaken shortly after the seizure  C.  The patient has new problems such as difficulty seeing, speaking or moving  D.  The patient was injured during the seizure  E.  The patient has a temperature over 102 F (39C)  F.  The patient vomited and now is having trouble breathing

## 2022-08-19 DIAGNOSIS — I872 Venous insufficiency (chronic) (peripheral): Secondary | ICD-10-CM | POA: Diagnosis not present

## 2022-08-19 DIAGNOSIS — J9690 Respiratory failure, unspecified, unspecified whether with hypoxia or hypercapnia: Secondary | ICD-10-CM | POA: Diagnosis not present

## 2022-08-19 DIAGNOSIS — I69354 Hemiplegia and hemiparesis following cerebral infarction affecting left non-dominant side: Secondary | ICD-10-CM | POA: Diagnosis not present

## 2022-08-19 DIAGNOSIS — F02A0 Dementia in other diseases classified elsewhere, mild, without behavioral disturbance, psychotic disturbance, mood disturbance, and anxiety: Secondary | ICD-10-CM | POA: Diagnosis not present

## 2022-08-19 DIAGNOSIS — Z7901 Long term (current) use of anticoagulants: Secondary | ICD-10-CM | POA: Diagnosis not present

## 2022-08-19 DIAGNOSIS — I69351 Hemiplegia and hemiparesis following cerebral infarction affecting right dominant side: Secondary | ICD-10-CM | POA: Diagnosis not present

## 2022-08-19 DIAGNOSIS — K5909 Other constipation: Secondary | ICD-10-CM | POA: Diagnosis not present

## 2022-08-19 DIAGNOSIS — N183 Chronic kidney disease, stage 3 unspecified: Secondary | ICD-10-CM | POA: Diagnosis not present

## 2022-08-19 DIAGNOSIS — M8580 Other specified disorders of bone density and structure, unspecified site: Secondary | ICD-10-CM | POA: Diagnosis not present

## 2022-08-19 DIAGNOSIS — K219 Gastro-esophageal reflux disease without esophagitis: Secondary | ICD-10-CM | POA: Diagnosis not present

## 2022-08-19 DIAGNOSIS — J309 Allergic rhinitis, unspecified: Secondary | ICD-10-CM | POA: Diagnosis not present

## 2022-08-19 DIAGNOSIS — E785 Hyperlipidemia, unspecified: Secondary | ICD-10-CM | POA: Diagnosis not present

## 2022-08-19 DIAGNOSIS — G40909 Epilepsy, unspecified, not intractable, without status epilepticus: Secondary | ICD-10-CM | POA: Diagnosis not present

## 2022-08-19 DIAGNOSIS — B379 Candidiasis, unspecified: Secondary | ICD-10-CM | POA: Diagnosis not present

## 2022-08-19 DIAGNOSIS — I129 Hypertensive chronic kidney disease with stage 1 through stage 4 chronic kidney disease, or unspecified chronic kidney disease: Secondary | ICD-10-CM | POA: Diagnosis not present

## 2022-08-19 DIAGNOSIS — M109 Gout, unspecified: Secondary | ICD-10-CM | POA: Diagnosis not present

## 2022-08-20 DIAGNOSIS — I129 Hypertensive chronic kidney disease with stage 1 through stage 4 chronic kidney disease, or unspecified chronic kidney disease: Secondary | ICD-10-CM | POA: Diagnosis not present

## 2022-08-20 DIAGNOSIS — F02A0 Dementia in other diseases classified elsewhere, mild, without behavioral disturbance, psychotic disturbance, mood disturbance, and anxiety: Secondary | ICD-10-CM | POA: Diagnosis not present

## 2022-08-20 DIAGNOSIS — I69351 Hemiplegia and hemiparesis following cerebral infarction affecting right dominant side: Secondary | ICD-10-CM | POA: Diagnosis not present

## 2022-08-20 DIAGNOSIS — G40909 Epilepsy, unspecified, not intractable, without status epilepticus: Secondary | ICD-10-CM | POA: Diagnosis not present

## 2022-08-20 DIAGNOSIS — Z7901 Long term (current) use of anticoagulants: Secondary | ICD-10-CM | POA: Diagnosis not present

## 2022-08-20 DIAGNOSIS — J309 Allergic rhinitis, unspecified: Secondary | ICD-10-CM | POA: Diagnosis not present

## 2022-08-20 DIAGNOSIS — E785 Hyperlipidemia, unspecified: Secondary | ICD-10-CM | POA: Diagnosis not present

## 2022-08-20 DIAGNOSIS — I69354 Hemiplegia and hemiparesis following cerebral infarction affecting left non-dominant side: Secondary | ICD-10-CM | POA: Diagnosis not present

## 2022-08-20 DIAGNOSIS — I872 Venous insufficiency (chronic) (peripheral): Secondary | ICD-10-CM | POA: Diagnosis not present

## 2022-08-20 DIAGNOSIS — K5909 Other constipation: Secondary | ICD-10-CM | POA: Diagnosis not present

## 2022-08-20 DIAGNOSIS — M8580 Other specified disorders of bone density and structure, unspecified site: Secondary | ICD-10-CM | POA: Diagnosis not present

## 2022-08-20 DIAGNOSIS — K219 Gastro-esophageal reflux disease without esophagitis: Secondary | ICD-10-CM | POA: Diagnosis not present

## 2022-08-20 DIAGNOSIS — J9690 Respiratory failure, unspecified, unspecified whether with hypoxia or hypercapnia: Secondary | ICD-10-CM | POA: Diagnosis not present

## 2022-08-20 DIAGNOSIS — B379 Candidiasis, unspecified: Secondary | ICD-10-CM | POA: Diagnosis not present

## 2022-08-20 DIAGNOSIS — M109 Gout, unspecified: Secondary | ICD-10-CM | POA: Diagnosis not present

## 2022-08-20 DIAGNOSIS — N183 Chronic kidney disease, stage 3 unspecified: Secondary | ICD-10-CM | POA: Diagnosis not present

## 2022-08-25 DIAGNOSIS — E785 Hyperlipidemia, unspecified: Secondary | ICD-10-CM | POA: Diagnosis not present

## 2022-08-25 DIAGNOSIS — Z7901 Long term (current) use of anticoagulants: Secondary | ICD-10-CM | POA: Diagnosis not present

## 2022-08-25 DIAGNOSIS — F02A0 Dementia in other diseases classified elsewhere, mild, without behavioral disturbance, psychotic disturbance, mood disturbance, and anxiety: Secondary | ICD-10-CM | POA: Diagnosis not present

## 2022-08-25 DIAGNOSIS — I872 Venous insufficiency (chronic) (peripheral): Secondary | ICD-10-CM | POA: Diagnosis not present

## 2022-08-25 DIAGNOSIS — K5909 Other constipation: Secondary | ICD-10-CM | POA: Diagnosis not present

## 2022-08-25 DIAGNOSIS — J9690 Respiratory failure, unspecified, unspecified whether with hypoxia or hypercapnia: Secondary | ICD-10-CM | POA: Diagnosis not present

## 2022-08-25 DIAGNOSIS — N183 Chronic kidney disease, stage 3 unspecified: Secondary | ICD-10-CM | POA: Diagnosis not present

## 2022-08-25 DIAGNOSIS — G40909 Epilepsy, unspecified, not intractable, without status epilepticus: Secondary | ICD-10-CM | POA: Diagnosis not present

## 2022-08-25 DIAGNOSIS — B379 Candidiasis, unspecified: Secondary | ICD-10-CM | POA: Diagnosis not present

## 2022-08-25 DIAGNOSIS — J309 Allergic rhinitis, unspecified: Secondary | ICD-10-CM | POA: Diagnosis not present

## 2022-08-25 DIAGNOSIS — I129 Hypertensive chronic kidney disease with stage 1 through stage 4 chronic kidney disease, or unspecified chronic kidney disease: Secondary | ICD-10-CM | POA: Diagnosis not present

## 2022-08-25 DIAGNOSIS — M8580 Other specified disorders of bone density and structure, unspecified site: Secondary | ICD-10-CM | POA: Diagnosis not present

## 2022-08-25 DIAGNOSIS — I69354 Hemiplegia and hemiparesis following cerebral infarction affecting left non-dominant side: Secondary | ICD-10-CM | POA: Diagnosis not present

## 2022-08-25 DIAGNOSIS — M109 Gout, unspecified: Secondary | ICD-10-CM | POA: Diagnosis not present

## 2022-08-25 DIAGNOSIS — I69351 Hemiplegia and hemiparesis following cerebral infarction affecting right dominant side: Secondary | ICD-10-CM | POA: Diagnosis not present

## 2022-08-25 DIAGNOSIS — K219 Gastro-esophageal reflux disease without esophagitis: Secondary | ICD-10-CM | POA: Diagnosis not present

## 2022-08-28 DIAGNOSIS — E785 Hyperlipidemia, unspecified: Secondary | ICD-10-CM | POA: Diagnosis not present

## 2022-08-28 DIAGNOSIS — I69354 Hemiplegia and hemiparesis following cerebral infarction affecting left non-dominant side: Secondary | ICD-10-CM | POA: Diagnosis not present

## 2022-08-28 DIAGNOSIS — J9690 Respiratory failure, unspecified, unspecified whether with hypoxia or hypercapnia: Secondary | ICD-10-CM | POA: Diagnosis not present

## 2022-08-28 DIAGNOSIS — M109 Gout, unspecified: Secondary | ICD-10-CM | POA: Diagnosis not present

## 2022-08-28 DIAGNOSIS — F02A0 Dementia in other diseases classified elsewhere, mild, without behavioral disturbance, psychotic disturbance, mood disturbance, and anxiety: Secondary | ICD-10-CM | POA: Diagnosis not present

## 2022-08-28 DIAGNOSIS — N183 Chronic kidney disease, stage 3 unspecified: Secondary | ICD-10-CM | POA: Diagnosis not present

## 2022-08-28 DIAGNOSIS — I872 Venous insufficiency (chronic) (peripheral): Secondary | ICD-10-CM | POA: Diagnosis not present

## 2022-08-28 DIAGNOSIS — M8580 Other specified disorders of bone density and structure, unspecified site: Secondary | ICD-10-CM | POA: Diagnosis not present

## 2022-08-28 DIAGNOSIS — B379 Candidiasis, unspecified: Secondary | ICD-10-CM | POA: Diagnosis not present

## 2022-08-28 DIAGNOSIS — K5909 Other constipation: Secondary | ICD-10-CM | POA: Diagnosis not present

## 2022-08-28 DIAGNOSIS — J309 Allergic rhinitis, unspecified: Secondary | ICD-10-CM | POA: Diagnosis not present

## 2022-08-28 DIAGNOSIS — I129 Hypertensive chronic kidney disease with stage 1 through stage 4 chronic kidney disease, or unspecified chronic kidney disease: Secondary | ICD-10-CM | POA: Diagnosis not present

## 2022-08-28 DIAGNOSIS — K219 Gastro-esophageal reflux disease without esophagitis: Secondary | ICD-10-CM | POA: Diagnosis not present

## 2022-08-28 DIAGNOSIS — I69351 Hemiplegia and hemiparesis following cerebral infarction affecting right dominant side: Secondary | ICD-10-CM | POA: Diagnosis not present

## 2022-08-28 DIAGNOSIS — Z7901 Long term (current) use of anticoagulants: Secondary | ICD-10-CM | POA: Diagnosis not present

## 2022-08-28 DIAGNOSIS — G40909 Epilepsy, unspecified, not intractable, without status epilepticus: Secondary | ICD-10-CM | POA: Diagnosis not present

## 2022-08-29 DIAGNOSIS — J309 Allergic rhinitis, unspecified: Secondary | ICD-10-CM | POA: Diagnosis not present

## 2022-08-29 DIAGNOSIS — I69354 Hemiplegia and hemiparesis following cerebral infarction affecting left non-dominant side: Secondary | ICD-10-CM | POA: Diagnosis not present

## 2022-08-29 DIAGNOSIS — N183 Chronic kidney disease, stage 3 unspecified: Secondary | ICD-10-CM | POA: Diagnosis not present

## 2022-08-29 DIAGNOSIS — K219 Gastro-esophageal reflux disease without esophagitis: Secondary | ICD-10-CM | POA: Diagnosis not present

## 2022-08-29 DIAGNOSIS — T24012D Burn of unspecified degree of left thigh, subsequent encounter: Secondary | ICD-10-CM | POA: Diagnosis not present

## 2022-08-29 DIAGNOSIS — K5909 Other constipation: Secondary | ICD-10-CM | POA: Diagnosis not present

## 2022-08-29 DIAGNOSIS — E785 Hyperlipidemia, unspecified: Secondary | ICD-10-CM | POA: Diagnosis not present

## 2022-08-29 DIAGNOSIS — I69351 Hemiplegia and hemiparesis following cerebral infarction affecting right dominant side: Secondary | ICD-10-CM | POA: Diagnosis not present

## 2022-08-29 DIAGNOSIS — M109 Gout, unspecified: Secondary | ICD-10-CM | POA: Diagnosis not present

## 2022-08-29 DIAGNOSIS — Z993 Dependence on wheelchair: Secondary | ICD-10-CM | POA: Diagnosis not present

## 2022-08-29 DIAGNOSIS — Z556 Problems related to health literacy: Secondary | ICD-10-CM | POA: Diagnosis not present

## 2022-08-29 DIAGNOSIS — Z7901 Long term (current) use of anticoagulants: Secondary | ICD-10-CM | POA: Diagnosis not present

## 2022-08-29 DIAGNOSIS — F02A0 Dementia in other diseases classified elsewhere, mild, without behavioral disturbance, psychotic disturbance, mood disturbance, and anxiety: Secondary | ICD-10-CM | POA: Diagnosis not present

## 2022-08-29 DIAGNOSIS — G40909 Epilepsy, unspecified, not intractable, without status epilepticus: Secondary | ICD-10-CM | POA: Diagnosis not present

## 2022-08-29 DIAGNOSIS — I872 Venous insufficiency (chronic) (peripheral): Secondary | ICD-10-CM | POA: Diagnosis not present

## 2022-08-29 DIAGNOSIS — S71002D Unspecified open wound, left hip, subsequent encounter: Secondary | ICD-10-CM | POA: Diagnosis not present

## 2022-08-29 DIAGNOSIS — M8580 Other specified disorders of bone density and structure, unspecified site: Secondary | ICD-10-CM | POA: Diagnosis not present

## 2022-08-29 DIAGNOSIS — I129 Hypertensive chronic kidney disease with stage 1 through stage 4 chronic kidney disease, or unspecified chronic kidney disease: Secondary | ICD-10-CM | POA: Diagnosis not present

## 2022-09-02 DIAGNOSIS — I69351 Hemiplegia and hemiparesis following cerebral infarction affecting right dominant side: Secondary | ICD-10-CM | POA: Diagnosis not present

## 2022-09-02 DIAGNOSIS — G40909 Epilepsy, unspecified, not intractable, without status epilepticus: Secondary | ICD-10-CM | POA: Diagnosis not present

## 2022-09-02 DIAGNOSIS — E785 Hyperlipidemia, unspecified: Secondary | ICD-10-CM | POA: Diagnosis not present

## 2022-09-02 DIAGNOSIS — I129 Hypertensive chronic kidney disease with stage 1 through stage 4 chronic kidney disease, or unspecified chronic kidney disease: Secondary | ICD-10-CM | POA: Diagnosis not present

## 2022-09-02 DIAGNOSIS — Z556 Problems related to health literacy: Secondary | ICD-10-CM | POA: Diagnosis not present

## 2022-09-02 DIAGNOSIS — M8580 Other specified disorders of bone density and structure, unspecified site: Secondary | ICD-10-CM | POA: Diagnosis not present

## 2022-09-02 DIAGNOSIS — Z7901 Long term (current) use of anticoagulants: Secondary | ICD-10-CM | POA: Diagnosis not present

## 2022-09-02 DIAGNOSIS — K219 Gastro-esophageal reflux disease without esophagitis: Secondary | ICD-10-CM | POA: Diagnosis not present

## 2022-09-02 DIAGNOSIS — M109 Gout, unspecified: Secondary | ICD-10-CM | POA: Diagnosis not present

## 2022-09-02 DIAGNOSIS — S71002D Unspecified open wound, left hip, subsequent encounter: Secondary | ICD-10-CM | POA: Diagnosis not present

## 2022-09-02 DIAGNOSIS — I69354 Hemiplegia and hemiparesis following cerebral infarction affecting left non-dominant side: Secondary | ICD-10-CM | POA: Diagnosis not present

## 2022-09-02 DIAGNOSIS — I872 Venous insufficiency (chronic) (peripheral): Secondary | ICD-10-CM | POA: Diagnosis not present

## 2022-09-02 DIAGNOSIS — K5909 Other constipation: Secondary | ICD-10-CM | POA: Diagnosis not present

## 2022-09-02 DIAGNOSIS — N183 Chronic kidney disease, stage 3 unspecified: Secondary | ICD-10-CM | POA: Diagnosis not present

## 2022-09-02 DIAGNOSIS — J309 Allergic rhinitis, unspecified: Secondary | ICD-10-CM | POA: Diagnosis not present

## 2022-09-02 DIAGNOSIS — F02A0 Dementia in other diseases classified elsewhere, mild, without behavioral disturbance, psychotic disturbance, mood disturbance, and anxiety: Secondary | ICD-10-CM | POA: Diagnosis not present

## 2022-09-03 DIAGNOSIS — I10 Essential (primary) hypertension: Secondary | ICD-10-CM | POA: Diagnosis not present

## 2022-09-04 DIAGNOSIS — I10 Essential (primary) hypertension: Secondary | ICD-10-CM | POA: Diagnosis not present

## 2022-09-04 DIAGNOSIS — F0390 Unspecified dementia without behavioral disturbance: Secondary | ICD-10-CM | POA: Diagnosis not present

## 2022-09-09 DIAGNOSIS — E785 Hyperlipidemia, unspecified: Secondary | ICD-10-CM | POA: Diagnosis not present

## 2022-09-09 DIAGNOSIS — M8580 Other specified disorders of bone density and structure, unspecified site: Secondary | ICD-10-CM | POA: Diagnosis not present

## 2022-09-09 DIAGNOSIS — M109 Gout, unspecified: Secondary | ICD-10-CM | POA: Diagnosis not present

## 2022-09-09 DIAGNOSIS — K5909 Other constipation: Secondary | ICD-10-CM | POA: Diagnosis not present

## 2022-09-09 DIAGNOSIS — I69354 Hemiplegia and hemiparesis following cerebral infarction affecting left non-dominant side: Secondary | ICD-10-CM | POA: Diagnosis not present

## 2022-09-09 DIAGNOSIS — J309 Allergic rhinitis, unspecified: Secondary | ICD-10-CM | POA: Diagnosis not present

## 2022-09-09 DIAGNOSIS — Z7901 Long term (current) use of anticoagulants: Secondary | ICD-10-CM | POA: Diagnosis not present

## 2022-09-09 DIAGNOSIS — I69351 Hemiplegia and hemiparesis following cerebral infarction affecting right dominant side: Secondary | ICD-10-CM | POA: Diagnosis not present

## 2022-09-09 DIAGNOSIS — F02A0 Dementia in other diseases classified elsewhere, mild, without behavioral disturbance, psychotic disturbance, mood disturbance, and anxiety: Secondary | ICD-10-CM | POA: Diagnosis not present

## 2022-09-09 DIAGNOSIS — I129 Hypertensive chronic kidney disease with stage 1 through stage 4 chronic kidney disease, or unspecified chronic kidney disease: Secondary | ICD-10-CM | POA: Diagnosis not present

## 2022-09-09 DIAGNOSIS — Z556 Problems related to health literacy: Secondary | ICD-10-CM | POA: Diagnosis not present

## 2022-09-09 DIAGNOSIS — G40909 Epilepsy, unspecified, not intractable, without status epilepticus: Secondary | ICD-10-CM | POA: Diagnosis not present

## 2022-09-09 DIAGNOSIS — I872 Venous insufficiency (chronic) (peripheral): Secondary | ICD-10-CM | POA: Diagnosis not present

## 2022-09-09 DIAGNOSIS — K219 Gastro-esophageal reflux disease without esophagitis: Secondary | ICD-10-CM | POA: Diagnosis not present

## 2022-09-09 DIAGNOSIS — S71002D Unspecified open wound, left hip, subsequent encounter: Secondary | ICD-10-CM | POA: Diagnosis not present

## 2022-09-09 DIAGNOSIS — N183 Chronic kidney disease, stage 3 unspecified: Secondary | ICD-10-CM | POA: Diagnosis not present

## 2022-09-16 ENCOUNTER — Other Ambulatory Visit: Payer: Self-pay | Admitting: Neurology

## 2022-09-16 DIAGNOSIS — M109 Gout, unspecified: Secondary | ICD-10-CM | POA: Diagnosis not present

## 2022-09-16 DIAGNOSIS — I69354 Hemiplegia and hemiparesis following cerebral infarction affecting left non-dominant side: Secondary | ICD-10-CM | POA: Diagnosis not present

## 2022-09-16 DIAGNOSIS — F02A0 Dementia in other diseases classified elsewhere, mild, without behavioral disturbance, psychotic disturbance, mood disturbance, and anxiety: Secondary | ICD-10-CM | POA: Diagnosis not present

## 2022-09-16 DIAGNOSIS — I69351 Hemiplegia and hemiparesis following cerebral infarction affecting right dominant side: Secondary | ICD-10-CM | POA: Diagnosis not present

## 2022-09-16 DIAGNOSIS — I129 Hypertensive chronic kidney disease with stage 1 through stage 4 chronic kidney disease, or unspecified chronic kidney disease: Secondary | ICD-10-CM | POA: Diagnosis not present

## 2022-09-16 DIAGNOSIS — Z556 Problems related to health literacy: Secondary | ICD-10-CM | POA: Diagnosis not present

## 2022-09-16 DIAGNOSIS — G40909 Epilepsy, unspecified, not intractable, without status epilepticus: Secondary | ICD-10-CM | POA: Diagnosis not present

## 2022-09-16 DIAGNOSIS — N183 Chronic kidney disease, stage 3 unspecified: Secondary | ICD-10-CM | POA: Diagnosis not present

## 2022-09-16 DIAGNOSIS — K5909 Other constipation: Secondary | ICD-10-CM | POA: Diagnosis not present

## 2022-09-16 DIAGNOSIS — I872 Venous insufficiency (chronic) (peripheral): Secondary | ICD-10-CM | POA: Diagnosis not present

## 2022-09-16 DIAGNOSIS — M8580 Other specified disorders of bone density and structure, unspecified site: Secondary | ICD-10-CM | POA: Diagnosis not present

## 2022-09-16 DIAGNOSIS — Z7901 Long term (current) use of anticoagulants: Secondary | ICD-10-CM | POA: Diagnosis not present

## 2022-09-16 DIAGNOSIS — E785 Hyperlipidemia, unspecified: Secondary | ICD-10-CM | POA: Diagnosis not present

## 2022-09-16 DIAGNOSIS — K219 Gastro-esophageal reflux disease without esophagitis: Secondary | ICD-10-CM | POA: Diagnosis not present

## 2022-09-16 DIAGNOSIS — J309 Allergic rhinitis, unspecified: Secondary | ICD-10-CM | POA: Diagnosis not present

## 2022-09-16 DIAGNOSIS — S71002D Unspecified open wound, left hip, subsequent encounter: Secondary | ICD-10-CM | POA: Diagnosis not present

## 2022-09-22 DIAGNOSIS — M8580 Other specified disorders of bone density and structure, unspecified site: Secondary | ICD-10-CM | POA: Diagnosis not present

## 2022-09-22 DIAGNOSIS — N183 Chronic kidney disease, stage 3 unspecified: Secondary | ICD-10-CM | POA: Diagnosis not present

## 2022-09-22 DIAGNOSIS — K219 Gastro-esophageal reflux disease without esophagitis: Secondary | ICD-10-CM | POA: Diagnosis not present

## 2022-09-22 DIAGNOSIS — M109 Gout, unspecified: Secondary | ICD-10-CM | POA: Diagnosis not present

## 2022-09-22 DIAGNOSIS — I69354 Hemiplegia and hemiparesis following cerebral infarction affecting left non-dominant side: Secondary | ICD-10-CM | POA: Diagnosis not present

## 2022-09-22 DIAGNOSIS — F02A0 Dementia in other diseases classified elsewhere, mild, without behavioral disturbance, psychotic disturbance, mood disturbance, and anxiety: Secondary | ICD-10-CM | POA: Diagnosis not present

## 2022-09-22 DIAGNOSIS — G40909 Epilepsy, unspecified, not intractable, without status epilepticus: Secondary | ICD-10-CM | POA: Diagnosis not present

## 2022-09-22 DIAGNOSIS — Z7901 Long term (current) use of anticoagulants: Secondary | ICD-10-CM | POA: Diagnosis not present

## 2022-09-22 DIAGNOSIS — I872 Venous insufficiency (chronic) (peripheral): Secondary | ICD-10-CM | POA: Diagnosis not present

## 2022-09-22 DIAGNOSIS — J309 Allergic rhinitis, unspecified: Secondary | ICD-10-CM | POA: Diagnosis not present

## 2022-09-22 DIAGNOSIS — Z556 Problems related to health literacy: Secondary | ICD-10-CM | POA: Diagnosis not present

## 2022-09-22 DIAGNOSIS — I69351 Hemiplegia and hemiparesis following cerebral infarction affecting right dominant side: Secondary | ICD-10-CM | POA: Diagnosis not present

## 2022-09-22 DIAGNOSIS — S71002D Unspecified open wound, left hip, subsequent encounter: Secondary | ICD-10-CM | POA: Diagnosis not present

## 2022-09-22 DIAGNOSIS — K5909 Other constipation: Secondary | ICD-10-CM | POA: Diagnosis not present

## 2022-09-22 DIAGNOSIS — I129 Hypertensive chronic kidney disease with stage 1 through stage 4 chronic kidney disease, or unspecified chronic kidney disease: Secondary | ICD-10-CM | POA: Diagnosis not present

## 2022-09-22 DIAGNOSIS — E785 Hyperlipidemia, unspecified: Secondary | ICD-10-CM | POA: Diagnosis not present

## 2022-09-24 ENCOUNTER — Other Ambulatory Visit: Payer: Self-pay | Admitting: Neurology

## 2022-09-28 DIAGNOSIS — K219 Gastro-esophageal reflux disease without esophagitis: Secondary | ICD-10-CM | POA: Diagnosis not present

## 2022-09-28 DIAGNOSIS — Z556 Problems related to health literacy: Secondary | ICD-10-CM | POA: Diagnosis not present

## 2022-09-28 DIAGNOSIS — I872 Venous insufficiency (chronic) (peripheral): Secondary | ICD-10-CM | POA: Diagnosis not present

## 2022-09-28 DIAGNOSIS — G40909 Epilepsy, unspecified, not intractable, without status epilepticus: Secondary | ICD-10-CM | POA: Diagnosis not present

## 2022-09-28 DIAGNOSIS — M109 Gout, unspecified: Secondary | ICD-10-CM | POA: Diagnosis not present

## 2022-09-28 DIAGNOSIS — I69351 Hemiplegia and hemiparesis following cerebral infarction affecting right dominant side: Secondary | ICD-10-CM | POA: Diagnosis not present

## 2022-09-28 DIAGNOSIS — Z7901 Long term (current) use of anticoagulants: Secondary | ICD-10-CM | POA: Diagnosis not present

## 2022-09-28 DIAGNOSIS — M8580 Other specified disorders of bone density and structure, unspecified site: Secondary | ICD-10-CM | POA: Diagnosis not present

## 2022-09-28 DIAGNOSIS — T24012D Burn of unspecified degree of left thigh, subsequent encounter: Secondary | ICD-10-CM | POA: Diagnosis not present

## 2022-09-28 DIAGNOSIS — K5909 Other constipation: Secondary | ICD-10-CM | POA: Diagnosis not present

## 2022-09-28 DIAGNOSIS — J309 Allergic rhinitis, unspecified: Secondary | ICD-10-CM | POA: Diagnosis not present

## 2022-09-28 DIAGNOSIS — N183 Chronic kidney disease, stage 3 unspecified: Secondary | ICD-10-CM | POA: Diagnosis not present

## 2022-09-28 DIAGNOSIS — Z993 Dependence on wheelchair: Secondary | ICD-10-CM | POA: Diagnosis not present

## 2022-09-28 DIAGNOSIS — E785 Hyperlipidemia, unspecified: Secondary | ICD-10-CM | POA: Diagnosis not present

## 2022-09-28 DIAGNOSIS — I129 Hypertensive chronic kidney disease with stage 1 through stage 4 chronic kidney disease, or unspecified chronic kidney disease: Secondary | ICD-10-CM | POA: Diagnosis not present

## 2022-09-28 DIAGNOSIS — I69354 Hemiplegia and hemiparesis following cerebral infarction affecting left non-dominant side: Secondary | ICD-10-CM | POA: Diagnosis not present

## 2022-09-28 DIAGNOSIS — F02A0 Dementia in other diseases classified elsewhere, mild, without behavioral disturbance, psychotic disturbance, mood disturbance, and anxiety: Secondary | ICD-10-CM | POA: Diagnosis not present

## 2022-09-28 DIAGNOSIS — S71002D Unspecified open wound, left hip, subsequent encounter: Secondary | ICD-10-CM | POA: Diagnosis not present

## 2022-09-29 DIAGNOSIS — I69354 Hemiplegia and hemiparesis following cerebral infarction affecting left non-dominant side: Secondary | ICD-10-CM | POA: Diagnosis not present

## 2022-09-29 DIAGNOSIS — Z556 Problems related to health literacy: Secondary | ICD-10-CM | POA: Diagnosis not present

## 2022-09-29 DIAGNOSIS — I872 Venous insufficiency (chronic) (peripheral): Secondary | ICD-10-CM | POA: Diagnosis not present

## 2022-09-29 DIAGNOSIS — M8580 Other specified disorders of bone density and structure, unspecified site: Secondary | ICD-10-CM | POA: Diagnosis not present

## 2022-09-29 DIAGNOSIS — N183 Chronic kidney disease, stage 3 unspecified: Secondary | ICD-10-CM | POA: Diagnosis not present

## 2022-09-29 DIAGNOSIS — Z7901 Long term (current) use of anticoagulants: Secondary | ICD-10-CM | POA: Diagnosis not present

## 2022-09-29 DIAGNOSIS — J309 Allergic rhinitis, unspecified: Secondary | ICD-10-CM | POA: Diagnosis not present

## 2022-09-29 DIAGNOSIS — I69351 Hemiplegia and hemiparesis following cerebral infarction affecting right dominant side: Secondary | ICD-10-CM | POA: Diagnosis not present

## 2022-09-29 DIAGNOSIS — S71002D Unspecified open wound, left hip, subsequent encounter: Secondary | ICD-10-CM | POA: Diagnosis not present

## 2022-09-29 DIAGNOSIS — I129 Hypertensive chronic kidney disease with stage 1 through stage 4 chronic kidney disease, or unspecified chronic kidney disease: Secondary | ICD-10-CM | POA: Diagnosis not present

## 2022-09-29 DIAGNOSIS — M109 Gout, unspecified: Secondary | ICD-10-CM | POA: Diagnosis not present

## 2022-09-29 DIAGNOSIS — F02A0 Dementia in other diseases classified elsewhere, mild, without behavioral disturbance, psychotic disturbance, mood disturbance, and anxiety: Secondary | ICD-10-CM | POA: Diagnosis not present

## 2022-09-29 DIAGNOSIS — K5909 Other constipation: Secondary | ICD-10-CM | POA: Diagnosis not present

## 2022-09-29 DIAGNOSIS — G40909 Epilepsy, unspecified, not intractable, without status epilepticus: Secondary | ICD-10-CM | POA: Diagnosis not present

## 2022-09-29 DIAGNOSIS — E785 Hyperlipidemia, unspecified: Secondary | ICD-10-CM | POA: Diagnosis not present

## 2022-09-29 DIAGNOSIS — K219 Gastro-esophageal reflux disease without esophagitis: Secondary | ICD-10-CM | POA: Diagnosis not present

## 2022-10-02 DIAGNOSIS — Z0001 Encounter for general adult medical examination with abnormal findings: Secondary | ICD-10-CM | POA: Diagnosis not present

## 2022-10-02 DIAGNOSIS — F0390 Unspecified dementia without behavioral disturbance: Secondary | ICD-10-CM | POA: Diagnosis not present

## 2022-10-02 DIAGNOSIS — Z8673 Personal history of transient ischemic attack (TIA), and cerebral infarction without residual deficits: Secondary | ICD-10-CM | POA: Diagnosis not present

## 2022-10-02 DIAGNOSIS — I10 Essential (primary) hypertension: Secondary | ICD-10-CM | POA: Diagnosis not present

## 2022-10-05 DIAGNOSIS — E785 Hyperlipidemia, unspecified: Secondary | ICD-10-CM | POA: Diagnosis not present

## 2022-10-05 DIAGNOSIS — K219 Gastro-esophageal reflux disease without esophagitis: Secondary | ICD-10-CM | POA: Diagnosis not present

## 2022-10-05 DIAGNOSIS — N183 Chronic kidney disease, stage 3 unspecified: Secondary | ICD-10-CM | POA: Diagnosis not present

## 2022-10-05 DIAGNOSIS — J309 Allergic rhinitis, unspecified: Secondary | ICD-10-CM | POA: Diagnosis not present

## 2022-10-05 DIAGNOSIS — I872 Venous insufficiency (chronic) (peripheral): Secondary | ICD-10-CM | POA: Diagnosis not present

## 2022-10-05 DIAGNOSIS — M8580 Other specified disorders of bone density and structure, unspecified site: Secondary | ICD-10-CM | POA: Diagnosis not present

## 2022-10-05 DIAGNOSIS — G40909 Epilepsy, unspecified, not intractable, without status epilepticus: Secondary | ICD-10-CM | POA: Diagnosis not present

## 2022-10-05 DIAGNOSIS — K5909 Other constipation: Secondary | ICD-10-CM | POA: Diagnosis not present

## 2022-10-05 DIAGNOSIS — F0390 Unspecified dementia without behavioral disturbance: Secondary | ICD-10-CM | POA: Diagnosis not present

## 2022-10-05 DIAGNOSIS — I129 Hypertensive chronic kidney disease with stage 1 through stage 4 chronic kidney disease, or unspecified chronic kidney disease: Secondary | ICD-10-CM | POA: Diagnosis not present

## 2022-10-05 DIAGNOSIS — F02A0 Dementia in other diseases classified elsewhere, mild, without behavioral disturbance, psychotic disturbance, mood disturbance, and anxiety: Secondary | ICD-10-CM | POA: Diagnosis not present

## 2022-10-05 DIAGNOSIS — Z556 Problems related to health literacy: Secondary | ICD-10-CM | POA: Diagnosis not present

## 2022-10-05 DIAGNOSIS — S71002D Unspecified open wound, left hip, subsequent encounter: Secondary | ICD-10-CM | POA: Diagnosis not present

## 2022-10-05 DIAGNOSIS — M109 Gout, unspecified: Secondary | ICD-10-CM | POA: Diagnosis not present

## 2022-10-05 DIAGNOSIS — I69354 Hemiplegia and hemiparesis following cerebral infarction affecting left non-dominant side: Secondary | ICD-10-CM | POA: Diagnosis not present

## 2022-10-05 DIAGNOSIS — Z7901 Long term (current) use of anticoagulants: Secondary | ICD-10-CM | POA: Diagnosis not present

## 2022-10-05 DIAGNOSIS — I69351 Hemiplegia and hemiparesis following cerebral infarction affecting right dominant side: Secondary | ICD-10-CM | POA: Diagnosis not present

## 2022-10-14 DIAGNOSIS — I69354 Hemiplegia and hemiparesis following cerebral infarction affecting left non-dominant side: Secondary | ICD-10-CM | POA: Diagnosis not present

## 2022-10-14 DIAGNOSIS — E785 Hyperlipidemia, unspecified: Secondary | ICD-10-CM | POA: Diagnosis not present

## 2022-10-14 DIAGNOSIS — M109 Gout, unspecified: Secondary | ICD-10-CM | POA: Diagnosis not present

## 2022-10-14 DIAGNOSIS — I129 Hypertensive chronic kidney disease with stage 1 through stage 4 chronic kidney disease, or unspecified chronic kidney disease: Secondary | ICD-10-CM | POA: Diagnosis not present

## 2022-10-14 DIAGNOSIS — S71002D Unspecified open wound, left hip, subsequent encounter: Secondary | ICD-10-CM | POA: Diagnosis not present

## 2022-10-14 DIAGNOSIS — M8580 Other specified disorders of bone density and structure, unspecified site: Secondary | ICD-10-CM | POA: Diagnosis not present

## 2022-10-14 DIAGNOSIS — N183 Chronic kidney disease, stage 3 unspecified: Secondary | ICD-10-CM | POA: Diagnosis not present

## 2022-10-14 DIAGNOSIS — K5909 Other constipation: Secondary | ICD-10-CM | POA: Diagnosis not present

## 2022-10-14 DIAGNOSIS — I69351 Hemiplegia and hemiparesis following cerebral infarction affecting right dominant side: Secondary | ICD-10-CM | POA: Diagnosis not present

## 2022-10-14 DIAGNOSIS — J309 Allergic rhinitis, unspecified: Secondary | ICD-10-CM | POA: Diagnosis not present

## 2022-10-14 DIAGNOSIS — Z7901 Long term (current) use of anticoagulants: Secondary | ICD-10-CM | POA: Diagnosis not present

## 2022-10-14 DIAGNOSIS — G40909 Epilepsy, unspecified, not intractable, without status epilepticus: Secondary | ICD-10-CM | POA: Diagnosis not present

## 2022-10-14 DIAGNOSIS — F02A0 Dementia in other diseases classified elsewhere, mild, without behavioral disturbance, psychotic disturbance, mood disturbance, and anxiety: Secondary | ICD-10-CM | POA: Diagnosis not present

## 2022-10-14 DIAGNOSIS — I872 Venous insufficiency (chronic) (peripheral): Secondary | ICD-10-CM | POA: Diagnosis not present

## 2022-10-14 DIAGNOSIS — Z556 Problems related to health literacy: Secondary | ICD-10-CM | POA: Diagnosis not present

## 2022-10-14 DIAGNOSIS — K219 Gastro-esophageal reflux disease without esophagitis: Secondary | ICD-10-CM | POA: Diagnosis not present

## 2022-10-22 DIAGNOSIS — M109 Gout, unspecified: Secondary | ICD-10-CM | POA: Diagnosis not present

## 2022-10-22 DIAGNOSIS — Z7901 Long term (current) use of anticoagulants: Secondary | ICD-10-CM | POA: Diagnosis not present

## 2022-10-22 DIAGNOSIS — I872 Venous insufficiency (chronic) (peripheral): Secondary | ICD-10-CM | POA: Diagnosis not present

## 2022-10-22 DIAGNOSIS — E785 Hyperlipidemia, unspecified: Secondary | ICD-10-CM | POA: Diagnosis not present

## 2022-10-22 DIAGNOSIS — S71002D Unspecified open wound, left hip, subsequent encounter: Secondary | ICD-10-CM | POA: Diagnosis not present

## 2022-10-22 DIAGNOSIS — F02A0 Dementia in other diseases classified elsewhere, mild, without behavioral disturbance, psychotic disturbance, mood disturbance, and anxiety: Secondary | ICD-10-CM | POA: Diagnosis not present

## 2022-10-22 DIAGNOSIS — I69351 Hemiplegia and hemiparesis following cerebral infarction affecting right dominant side: Secondary | ICD-10-CM | POA: Diagnosis not present

## 2022-10-22 DIAGNOSIS — M8580 Other specified disorders of bone density and structure, unspecified site: Secondary | ICD-10-CM | POA: Diagnosis not present

## 2022-10-22 DIAGNOSIS — N183 Chronic kidney disease, stage 3 unspecified: Secondary | ICD-10-CM | POA: Diagnosis not present

## 2022-10-22 DIAGNOSIS — G40909 Epilepsy, unspecified, not intractable, without status epilepticus: Secondary | ICD-10-CM | POA: Diagnosis not present

## 2022-10-22 DIAGNOSIS — I69354 Hemiplegia and hemiparesis following cerebral infarction affecting left non-dominant side: Secondary | ICD-10-CM | POA: Diagnosis not present

## 2022-10-22 DIAGNOSIS — Z556 Problems related to health literacy: Secondary | ICD-10-CM | POA: Diagnosis not present

## 2022-10-22 DIAGNOSIS — K5909 Other constipation: Secondary | ICD-10-CM | POA: Diagnosis not present

## 2022-10-22 DIAGNOSIS — J309 Allergic rhinitis, unspecified: Secondary | ICD-10-CM | POA: Diagnosis not present

## 2022-10-22 DIAGNOSIS — K219 Gastro-esophageal reflux disease without esophagitis: Secondary | ICD-10-CM | POA: Diagnosis not present

## 2022-10-22 DIAGNOSIS — I129 Hypertensive chronic kidney disease with stage 1 through stage 4 chronic kidney disease, or unspecified chronic kidney disease: Secondary | ICD-10-CM | POA: Diagnosis not present

## 2022-10-25 DIAGNOSIS — I129 Hypertensive chronic kidney disease with stage 1 through stage 4 chronic kidney disease, or unspecified chronic kidney disease: Secondary | ICD-10-CM | POA: Diagnosis not present

## 2022-10-25 DIAGNOSIS — J309 Allergic rhinitis, unspecified: Secondary | ICD-10-CM | POA: Diagnosis not present

## 2022-10-25 DIAGNOSIS — Z556 Problems related to health literacy: Secondary | ICD-10-CM | POA: Diagnosis not present

## 2022-10-25 DIAGNOSIS — K219 Gastro-esophageal reflux disease without esophagitis: Secondary | ICD-10-CM | POA: Diagnosis not present

## 2022-10-25 DIAGNOSIS — M8580 Other specified disorders of bone density and structure, unspecified site: Secondary | ICD-10-CM | POA: Diagnosis not present

## 2022-10-25 DIAGNOSIS — I69351 Hemiplegia and hemiparesis following cerebral infarction affecting right dominant side: Secondary | ICD-10-CM | POA: Diagnosis not present

## 2022-10-25 DIAGNOSIS — G40909 Epilepsy, unspecified, not intractable, without status epilepticus: Secondary | ICD-10-CM | POA: Diagnosis not present

## 2022-10-25 DIAGNOSIS — I69354 Hemiplegia and hemiparesis following cerebral infarction affecting left non-dominant side: Secondary | ICD-10-CM | POA: Diagnosis not present

## 2022-10-25 DIAGNOSIS — N183 Chronic kidney disease, stage 3 unspecified: Secondary | ICD-10-CM | POA: Diagnosis not present

## 2022-10-25 DIAGNOSIS — Z7901 Long term (current) use of anticoagulants: Secondary | ICD-10-CM | POA: Diagnosis not present

## 2022-10-25 DIAGNOSIS — S71002D Unspecified open wound, left hip, subsequent encounter: Secondary | ICD-10-CM | POA: Diagnosis not present

## 2022-10-25 DIAGNOSIS — F02A0 Dementia in other diseases classified elsewhere, mild, without behavioral disturbance, psychotic disturbance, mood disturbance, and anxiety: Secondary | ICD-10-CM | POA: Diagnosis not present

## 2022-10-25 DIAGNOSIS — M109 Gout, unspecified: Secondary | ICD-10-CM | POA: Diagnosis not present

## 2022-10-25 DIAGNOSIS — I872 Venous insufficiency (chronic) (peripheral): Secondary | ICD-10-CM | POA: Diagnosis not present

## 2022-10-25 DIAGNOSIS — E785 Hyperlipidemia, unspecified: Secondary | ICD-10-CM | POA: Diagnosis not present

## 2022-10-25 DIAGNOSIS — K5909 Other constipation: Secondary | ICD-10-CM | POA: Diagnosis not present

## 2022-10-27 DIAGNOSIS — I10 Essential (primary) hypertension: Secondary | ICD-10-CM | POA: Diagnosis not present

## 2022-10-28 DIAGNOSIS — I69354 Hemiplegia and hemiparesis following cerebral infarction affecting left non-dominant side: Secondary | ICD-10-CM | POA: Diagnosis not present

## 2022-10-28 DIAGNOSIS — G40909 Epilepsy, unspecified, not intractable, without status epilepticus: Secondary | ICD-10-CM | POA: Diagnosis not present

## 2022-10-28 DIAGNOSIS — E785 Hyperlipidemia, unspecified: Secondary | ICD-10-CM | POA: Diagnosis not present

## 2022-10-28 DIAGNOSIS — Z79899 Other long term (current) drug therapy: Secondary | ICD-10-CM | POA: Diagnosis not present

## 2022-10-28 DIAGNOSIS — I872 Venous insufficiency (chronic) (peripheral): Secondary | ICD-10-CM | POA: Diagnosis not present

## 2022-10-28 DIAGNOSIS — Z7901 Long term (current) use of anticoagulants: Secondary | ICD-10-CM | POA: Diagnosis not present

## 2022-10-28 DIAGNOSIS — F02A0 Dementia in other diseases classified elsewhere, mild, without behavioral disturbance, psychotic disturbance, mood disturbance, and anxiety: Secondary | ICD-10-CM | POA: Diagnosis not present

## 2022-10-28 DIAGNOSIS — M109 Gout, unspecified: Secondary | ICD-10-CM | POA: Diagnosis not present

## 2022-10-28 DIAGNOSIS — M8580 Other specified disorders of bone density and structure, unspecified site: Secondary | ICD-10-CM | POA: Diagnosis not present

## 2022-10-28 DIAGNOSIS — I69351 Hemiplegia and hemiparesis following cerebral infarction affecting right dominant side: Secondary | ICD-10-CM | POA: Diagnosis not present

## 2022-10-28 DIAGNOSIS — I129 Hypertensive chronic kidney disease with stage 1 through stage 4 chronic kidney disease, or unspecified chronic kidney disease: Secondary | ICD-10-CM | POA: Diagnosis not present

## 2022-10-28 DIAGNOSIS — Z556 Problems related to health literacy: Secondary | ICD-10-CM | POA: Diagnosis not present

## 2022-10-28 DIAGNOSIS — N183 Chronic kidney disease, stage 3 unspecified: Secondary | ICD-10-CM | POA: Diagnosis not present

## 2022-10-28 DIAGNOSIS — S71002D Unspecified open wound, left hip, subsequent encounter: Secondary | ICD-10-CM | POA: Diagnosis not present

## 2022-10-28 DIAGNOSIS — K219 Gastro-esophageal reflux disease without esophagitis: Secondary | ICD-10-CM | POA: Diagnosis not present

## 2022-10-30 DIAGNOSIS — S71002D Unspecified open wound, left hip, subsequent encounter: Secondary | ICD-10-CM | POA: Diagnosis not present

## 2022-10-30 DIAGNOSIS — E785 Hyperlipidemia, unspecified: Secondary | ICD-10-CM | POA: Diagnosis not present

## 2022-10-30 DIAGNOSIS — Z7901 Long term (current) use of anticoagulants: Secondary | ICD-10-CM | POA: Diagnosis not present

## 2022-10-30 DIAGNOSIS — Z79899 Other long term (current) drug therapy: Secondary | ICD-10-CM | POA: Diagnosis not present

## 2022-10-30 DIAGNOSIS — I872 Venous insufficiency (chronic) (peripheral): Secondary | ICD-10-CM | POA: Diagnosis not present

## 2022-10-30 DIAGNOSIS — M8580 Other specified disorders of bone density and structure, unspecified site: Secondary | ICD-10-CM | POA: Diagnosis not present

## 2022-10-30 DIAGNOSIS — M109 Gout, unspecified: Secondary | ICD-10-CM | POA: Diagnosis not present

## 2022-10-30 DIAGNOSIS — N183 Chronic kidney disease, stage 3 unspecified: Secondary | ICD-10-CM | POA: Diagnosis not present

## 2022-10-30 DIAGNOSIS — I69354 Hemiplegia and hemiparesis following cerebral infarction affecting left non-dominant side: Secondary | ICD-10-CM | POA: Diagnosis not present

## 2022-10-30 DIAGNOSIS — I69351 Hemiplegia and hemiparesis following cerebral infarction affecting right dominant side: Secondary | ICD-10-CM | POA: Diagnosis not present

## 2022-10-30 DIAGNOSIS — K219 Gastro-esophageal reflux disease without esophagitis: Secondary | ICD-10-CM | POA: Diagnosis not present

## 2022-10-30 DIAGNOSIS — G40909 Epilepsy, unspecified, not intractable, without status epilepticus: Secondary | ICD-10-CM | POA: Diagnosis not present

## 2022-10-30 DIAGNOSIS — Z556 Problems related to health literacy: Secondary | ICD-10-CM | POA: Diagnosis not present

## 2022-10-30 DIAGNOSIS — I129 Hypertensive chronic kidney disease with stage 1 through stage 4 chronic kidney disease, or unspecified chronic kidney disease: Secondary | ICD-10-CM | POA: Diagnosis not present

## 2022-10-30 DIAGNOSIS — F02A0 Dementia in other diseases classified elsewhere, mild, without behavioral disturbance, psychotic disturbance, mood disturbance, and anxiety: Secondary | ICD-10-CM | POA: Diagnosis not present

## 2022-11-01 DIAGNOSIS — I69354 Hemiplegia and hemiparesis following cerebral infarction affecting left non-dominant side: Secondary | ICD-10-CM | POA: Diagnosis not present

## 2022-11-01 DIAGNOSIS — S71002D Unspecified open wound, left hip, subsequent encounter: Secondary | ICD-10-CM | POA: Diagnosis not present

## 2022-11-01 DIAGNOSIS — I129 Hypertensive chronic kidney disease with stage 1 through stage 4 chronic kidney disease, or unspecified chronic kidney disease: Secondary | ICD-10-CM | POA: Diagnosis not present

## 2022-11-01 DIAGNOSIS — E785 Hyperlipidemia, unspecified: Secondary | ICD-10-CM | POA: Diagnosis not present

## 2022-11-01 DIAGNOSIS — N183 Chronic kidney disease, stage 3 unspecified: Secondary | ICD-10-CM | POA: Diagnosis not present

## 2022-11-01 DIAGNOSIS — Z556 Problems related to health literacy: Secondary | ICD-10-CM | POA: Diagnosis not present

## 2022-11-01 DIAGNOSIS — I69351 Hemiplegia and hemiparesis following cerebral infarction affecting right dominant side: Secondary | ICD-10-CM | POA: Diagnosis not present

## 2022-11-01 DIAGNOSIS — M8580 Other specified disorders of bone density and structure, unspecified site: Secondary | ICD-10-CM | POA: Diagnosis not present

## 2022-11-01 DIAGNOSIS — I872 Venous insufficiency (chronic) (peripheral): Secondary | ICD-10-CM | POA: Diagnosis not present

## 2022-11-01 DIAGNOSIS — K219 Gastro-esophageal reflux disease without esophagitis: Secondary | ICD-10-CM | POA: Diagnosis not present

## 2022-11-01 DIAGNOSIS — Z79899 Other long term (current) drug therapy: Secondary | ICD-10-CM | POA: Diagnosis not present

## 2022-11-01 DIAGNOSIS — G40909 Epilepsy, unspecified, not intractable, without status epilepticus: Secondary | ICD-10-CM | POA: Diagnosis not present

## 2022-11-01 DIAGNOSIS — Z7901 Long term (current) use of anticoagulants: Secondary | ICD-10-CM | POA: Diagnosis not present

## 2022-11-01 DIAGNOSIS — F02A0 Dementia in other diseases classified elsewhere, mild, without behavioral disturbance, psychotic disturbance, mood disturbance, and anxiety: Secondary | ICD-10-CM | POA: Diagnosis not present

## 2022-11-01 DIAGNOSIS — M109 Gout, unspecified: Secondary | ICD-10-CM | POA: Diagnosis not present

## 2022-11-03 DIAGNOSIS — G40909 Epilepsy, unspecified, not intractable, without status epilepticus: Secondary | ICD-10-CM | POA: Diagnosis not present

## 2022-11-03 DIAGNOSIS — F02A0 Dementia in other diseases classified elsewhere, mild, without behavioral disturbance, psychotic disturbance, mood disturbance, and anxiety: Secondary | ICD-10-CM | POA: Diagnosis not present

## 2022-11-03 DIAGNOSIS — I129 Hypertensive chronic kidney disease with stage 1 through stage 4 chronic kidney disease, or unspecified chronic kidney disease: Secondary | ICD-10-CM | POA: Diagnosis not present

## 2022-11-03 DIAGNOSIS — Z79899 Other long term (current) drug therapy: Secondary | ICD-10-CM | POA: Diagnosis not present

## 2022-11-03 DIAGNOSIS — Z7901 Long term (current) use of anticoagulants: Secondary | ICD-10-CM | POA: Diagnosis not present

## 2022-11-03 DIAGNOSIS — Z556 Problems related to health literacy: Secondary | ICD-10-CM | POA: Diagnosis not present

## 2022-11-03 DIAGNOSIS — E785 Hyperlipidemia, unspecified: Secondary | ICD-10-CM | POA: Diagnosis not present

## 2022-11-03 DIAGNOSIS — I69351 Hemiplegia and hemiparesis following cerebral infarction affecting right dominant side: Secondary | ICD-10-CM | POA: Diagnosis not present

## 2022-11-03 DIAGNOSIS — S71002D Unspecified open wound, left hip, subsequent encounter: Secondary | ICD-10-CM | POA: Diagnosis not present

## 2022-11-03 DIAGNOSIS — N183 Chronic kidney disease, stage 3 unspecified: Secondary | ICD-10-CM | POA: Diagnosis not present

## 2022-11-03 DIAGNOSIS — I872 Venous insufficiency (chronic) (peripheral): Secondary | ICD-10-CM | POA: Diagnosis not present

## 2022-11-03 DIAGNOSIS — M109 Gout, unspecified: Secondary | ICD-10-CM | POA: Diagnosis not present

## 2022-11-03 DIAGNOSIS — K219 Gastro-esophageal reflux disease without esophagitis: Secondary | ICD-10-CM | POA: Diagnosis not present

## 2022-11-03 DIAGNOSIS — I69354 Hemiplegia and hemiparesis following cerebral infarction affecting left non-dominant side: Secondary | ICD-10-CM | POA: Diagnosis not present

## 2022-11-03 DIAGNOSIS — M8580 Other specified disorders of bone density and structure, unspecified site: Secondary | ICD-10-CM | POA: Diagnosis not present

## 2022-11-07 DIAGNOSIS — F0390 Unspecified dementia without behavioral disturbance: Secondary | ICD-10-CM | POA: Diagnosis not present

## 2022-11-09 DIAGNOSIS — M109 Gout, unspecified: Secondary | ICD-10-CM | POA: Diagnosis not present

## 2022-11-09 DIAGNOSIS — N183 Chronic kidney disease, stage 3 unspecified: Secondary | ICD-10-CM | POA: Diagnosis not present

## 2022-11-09 DIAGNOSIS — K219 Gastro-esophageal reflux disease without esophagitis: Secondary | ICD-10-CM | POA: Diagnosis not present

## 2022-11-09 DIAGNOSIS — I872 Venous insufficiency (chronic) (peripheral): Secondary | ICD-10-CM | POA: Diagnosis not present

## 2022-11-09 DIAGNOSIS — Z556 Problems related to health literacy: Secondary | ICD-10-CM | POA: Diagnosis not present

## 2022-11-09 DIAGNOSIS — S71002D Unspecified open wound, left hip, subsequent encounter: Secondary | ICD-10-CM | POA: Diagnosis not present

## 2022-11-09 DIAGNOSIS — I69354 Hemiplegia and hemiparesis following cerebral infarction affecting left non-dominant side: Secondary | ICD-10-CM | POA: Diagnosis not present

## 2022-11-09 DIAGNOSIS — F02A0 Dementia in other diseases classified elsewhere, mild, without behavioral disturbance, psychotic disturbance, mood disturbance, and anxiety: Secondary | ICD-10-CM | POA: Diagnosis not present

## 2022-11-09 DIAGNOSIS — M8580 Other specified disorders of bone density and structure, unspecified site: Secondary | ICD-10-CM | POA: Diagnosis not present

## 2022-11-09 DIAGNOSIS — E785 Hyperlipidemia, unspecified: Secondary | ICD-10-CM | POA: Diagnosis not present

## 2022-11-09 DIAGNOSIS — I129 Hypertensive chronic kidney disease with stage 1 through stage 4 chronic kidney disease, or unspecified chronic kidney disease: Secondary | ICD-10-CM | POA: Diagnosis not present

## 2022-11-09 DIAGNOSIS — I69351 Hemiplegia and hemiparesis following cerebral infarction affecting right dominant side: Secondary | ICD-10-CM | POA: Diagnosis not present

## 2022-11-09 DIAGNOSIS — G40909 Epilepsy, unspecified, not intractable, without status epilepticus: Secondary | ICD-10-CM | POA: Diagnosis not present

## 2022-11-09 DIAGNOSIS — Z79899 Other long term (current) drug therapy: Secondary | ICD-10-CM | POA: Diagnosis not present

## 2022-11-09 DIAGNOSIS — Z7901 Long term (current) use of anticoagulants: Secondary | ICD-10-CM | POA: Diagnosis not present

## 2022-11-10 DIAGNOSIS — E785 Hyperlipidemia, unspecified: Secondary | ICD-10-CM | POA: Diagnosis not present

## 2022-11-10 DIAGNOSIS — I129 Hypertensive chronic kidney disease with stage 1 through stage 4 chronic kidney disease, or unspecified chronic kidney disease: Secondary | ICD-10-CM | POA: Diagnosis not present

## 2022-11-10 DIAGNOSIS — K219 Gastro-esophageal reflux disease without esophagitis: Secondary | ICD-10-CM | POA: Diagnosis not present

## 2022-11-10 DIAGNOSIS — I69351 Hemiplegia and hemiparesis following cerebral infarction affecting right dominant side: Secondary | ICD-10-CM | POA: Diagnosis not present

## 2022-11-10 DIAGNOSIS — N183 Chronic kidney disease, stage 3 unspecified: Secondary | ICD-10-CM | POA: Diagnosis not present

## 2022-11-10 DIAGNOSIS — Z7901 Long term (current) use of anticoagulants: Secondary | ICD-10-CM | POA: Diagnosis not present

## 2022-11-10 DIAGNOSIS — F02A0 Dementia in other diseases classified elsewhere, mild, without behavioral disturbance, psychotic disturbance, mood disturbance, and anxiety: Secondary | ICD-10-CM | POA: Diagnosis not present

## 2022-11-10 DIAGNOSIS — S71002D Unspecified open wound, left hip, subsequent encounter: Secondary | ICD-10-CM | POA: Diagnosis not present

## 2022-11-10 DIAGNOSIS — M109 Gout, unspecified: Secondary | ICD-10-CM | POA: Diagnosis not present

## 2022-11-10 DIAGNOSIS — Z79899 Other long term (current) drug therapy: Secondary | ICD-10-CM | POA: Diagnosis not present

## 2022-11-10 DIAGNOSIS — Z556 Problems related to health literacy: Secondary | ICD-10-CM | POA: Diagnosis not present

## 2022-11-10 DIAGNOSIS — I69354 Hemiplegia and hemiparesis following cerebral infarction affecting left non-dominant side: Secondary | ICD-10-CM | POA: Diagnosis not present

## 2022-11-10 DIAGNOSIS — G40909 Epilepsy, unspecified, not intractable, without status epilepticus: Secondary | ICD-10-CM | POA: Diagnosis not present

## 2022-11-10 DIAGNOSIS — M8580 Other specified disorders of bone density and structure, unspecified site: Secondary | ICD-10-CM | POA: Diagnosis not present

## 2022-11-10 DIAGNOSIS — I872 Venous insufficiency (chronic) (peripheral): Secondary | ICD-10-CM | POA: Diagnosis not present

## 2022-11-19 DIAGNOSIS — S71002D Unspecified open wound, left hip, subsequent encounter: Secondary | ICD-10-CM | POA: Diagnosis not present

## 2022-11-19 DIAGNOSIS — I69351 Hemiplegia and hemiparesis following cerebral infarction affecting right dominant side: Secondary | ICD-10-CM | POA: Diagnosis not present

## 2022-11-19 DIAGNOSIS — I872 Venous insufficiency (chronic) (peripheral): Secondary | ICD-10-CM | POA: Diagnosis not present

## 2022-11-19 DIAGNOSIS — Z556 Problems related to health literacy: Secondary | ICD-10-CM | POA: Diagnosis not present

## 2022-11-19 DIAGNOSIS — F02A0 Dementia in other diseases classified elsewhere, mild, without behavioral disturbance, psychotic disturbance, mood disturbance, and anxiety: Secondary | ICD-10-CM | POA: Diagnosis not present

## 2022-11-19 DIAGNOSIS — K219 Gastro-esophageal reflux disease without esophagitis: Secondary | ICD-10-CM | POA: Diagnosis not present

## 2022-11-19 DIAGNOSIS — Z7901 Long term (current) use of anticoagulants: Secondary | ICD-10-CM | POA: Diagnosis not present

## 2022-11-19 DIAGNOSIS — I69354 Hemiplegia and hemiparesis following cerebral infarction affecting left non-dominant side: Secondary | ICD-10-CM | POA: Diagnosis not present

## 2022-11-19 DIAGNOSIS — N183 Chronic kidney disease, stage 3 unspecified: Secondary | ICD-10-CM | POA: Diagnosis not present

## 2022-11-19 DIAGNOSIS — E785 Hyperlipidemia, unspecified: Secondary | ICD-10-CM | POA: Diagnosis not present

## 2022-11-19 DIAGNOSIS — Z79899 Other long term (current) drug therapy: Secondary | ICD-10-CM | POA: Diagnosis not present

## 2022-11-19 DIAGNOSIS — M8580 Other specified disorders of bone density and structure, unspecified site: Secondary | ICD-10-CM | POA: Diagnosis not present

## 2022-11-19 DIAGNOSIS — I129 Hypertensive chronic kidney disease with stage 1 through stage 4 chronic kidney disease, or unspecified chronic kidney disease: Secondary | ICD-10-CM | POA: Diagnosis not present

## 2022-11-19 DIAGNOSIS — G40909 Epilepsy, unspecified, not intractable, without status epilepticus: Secondary | ICD-10-CM | POA: Diagnosis not present

## 2022-11-19 DIAGNOSIS — M109 Gout, unspecified: Secondary | ICD-10-CM | POA: Diagnosis not present

## 2022-11-23 DIAGNOSIS — I69351 Hemiplegia and hemiparesis following cerebral infarction affecting right dominant side: Secondary | ICD-10-CM | POA: Diagnosis not present

## 2022-11-23 DIAGNOSIS — I129 Hypertensive chronic kidney disease with stage 1 through stage 4 chronic kidney disease, or unspecified chronic kidney disease: Secondary | ICD-10-CM | POA: Diagnosis not present

## 2022-11-23 DIAGNOSIS — E785 Hyperlipidemia, unspecified: Secondary | ICD-10-CM | POA: Diagnosis not present

## 2022-11-23 DIAGNOSIS — M109 Gout, unspecified: Secondary | ICD-10-CM | POA: Diagnosis not present

## 2022-11-23 DIAGNOSIS — S71002D Unspecified open wound, left hip, subsequent encounter: Secondary | ICD-10-CM | POA: Diagnosis not present

## 2022-11-23 DIAGNOSIS — Z7901 Long term (current) use of anticoagulants: Secondary | ICD-10-CM | POA: Diagnosis not present

## 2022-11-23 DIAGNOSIS — Z556 Problems related to health literacy: Secondary | ICD-10-CM | POA: Diagnosis not present

## 2022-11-23 DIAGNOSIS — G40909 Epilepsy, unspecified, not intractable, without status epilepticus: Secondary | ICD-10-CM | POA: Diagnosis not present

## 2022-11-23 DIAGNOSIS — F02A0 Dementia in other diseases classified elsewhere, mild, without behavioral disturbance, psychotic disturbance, mood disturbance, and anxiety: Secondary | ICD-10-CM | POA: Diagnosis not present

## 2022-11-23 DIAGNOSIS — I872 Venous insufficiency (chronic) (peripheral): Secondary | ICD-10-CM | POA: Diagnosis not present

## 2022-11-23 DIAGNOSIS — M8580 Other specified disorders of bone density and structure, unspecified site: Secondary | ICD-10-CM | POA: Diagnosis not present

## 2022-11-23 DIAGNOSIS — K219 Gastro-esophageal reflux disease without esophagitis: Secondary | ICD-10-CM | POA: Diagnosis not present

## 2022-11-23 DIAGNOSIS — N183 Chronic kidney disease, stage 3 unspecified: Secondary | ICD-10-CM | POA: Diagnosis not present

## 2022-11-23 DIAGNOSIS — Z79899 Other long term (current) drug therapy: Secondary | ICD-10-CM | POA: Diagnosis not present

## 2022-11-23 DIAGNOSIS — I69354 Hemiplegia and hemiparesis following cerebral infarction affecting left non-dominant side: Secondary | ICD-10-CM | POA: Diagnosis not present

## 2022-11-24 DIAGNOSIS — E876 Hypokalemia: Secondary | ICD-10-CM | POA: Diagnosis not present

## 2022-11-24 DIAGNOSIS — R54 Age-related physical debility: Secondary | ICD-10-CM | POA: Diagnosis not present

## 2022-11-24 DIAGNOSIS — I10 Essential (primary) hypertension: Secondary | ICD-10-CM | POA: Diagnosis not present

## 2022-11-27 DIAGNOSIS — I872 Venous insufficiency (chronic) (peripheral): Secondary | ICD-10-CM | POA: Diagnosis not present

## 2022-11-27 DIAGNOSIS — Z556 Problems related to health literacy: Secondary | ICD-10-CM | POA: Diagnosis not present

## 2022-11-27 DIAGNOSIS — M109 Gout, unspecified: Secondary | ICD-10-CM | POA: Diagnosis not present

## 2022-11-27 DIAGNOSIS — I69354 Hemiplegia and hemiparesis following cerebral infarction affecting left non-dominant side: Secondary | ICD-10-CM | POA: Diagnosis not present

## 2022-11-27 DIAGNOSIS — Z79899 Other long term (current) drug therapy: Secondary | ICD-10-CM | POA: Diagnosis not present

## 2022-11-27 DIAGNOSIS — I129 Hypertensive chronic kidney disease with stage 1 through stage 4 chronic kidney disease, or unspecified chronic kidney disease: Secondary | ICD-10-CM | POA: Diagnosis not present

## 2022-11-27 DIAGNOSIS — Z7901 Long term (current) use of anticoagulants: Secondary | ICD-10-CM | POA: Diagnosis not present

## 2022-11-27 DIAGNOSIS — G40909 Epilepsy, unspecified, not intractable, without status epilepticus: Secondary | ICD-10-CM | POA: Diagnosis not present

## 2022-11-27 DIAGNOSIS — S71002D Unspecified open wound, left hip, subsequent encounter: Secondary | ICD-10-CM | POA: Diagnosis not present

## 2022-11-27 DIAGNOSIS — F02A0 Dementia in other diseases classified elsewhere, mild, without behavioral disturbance, psychotic disturbance, mood disturbance, and anxiety: Secondary | ICD-10-CM | POA: Diagnosis not present

## 2022-11-27 DIAGNOSIS — I69351 Hemiplegia and hemiparesis following cerebral infarction affecting right dominant side: Secondary | ICD-10-CM | POA: Diagnosis not present

## 2022-11-27 DIAGNOSIS — N183 Chronic kidney disease, stage 3 unspecified: Secondary | ICD-10-CM | POA: Diagnosis not present

## 2022-11-27 DIAGNOSIS — M8580 Other specified disorders of bone density and structure, unspecified site: Secondary | ICD-10-CM | POA: Diagnosis not present

## 2022-11-27 DIAGNOSIS — E785 Hyperlipidemia, unspecified: Secondary | ICD-10-CM | POA: Diagnosis not present

## 2022-11-27 DIAGNOSIS — K219 Gastro-esophageal reflux disease without esophagitis: Secondary | ICD-10-CM | POA: Diagnosis not present

## 2022-11-30 DIAGNOSIS — G40909 Epilepsy, unspecified, not intractable, without status epilepticus: Secondary | ICD-10-CM | POA: Diagnosis not present

## 2022-11-30 DIAGNOSIS — E785 Hyperlipidemia, unspecified: Secondary | ICD-10-CM | POA: Diagnosis not present

## 2022-11-30 DIAGNOSIS — M109 Gout, unspecified: Secondary | ICD-10-CM | POA: Diagnosis not present

## 2022-11-30 DIAGNOSIS — N183 Chronic kidney disease, stage 3 unspecified: Secondary | ICD-10-CM | POA: Diagnosis not present

## 2022-11-30 DIAGNOSIS — I872 Venous insufficiency (chronic) (peripheral): Secondary | ICD-10-CM | POA: Diagnosis not present

## 2022-11-30 DIAGNOSIS — K219 Gastro-esophageal reflux disease without esophagitis: Secondary | ICD-10-CM | POA: Diagnosis not present

## 2022-11-30 DIAGNOSIS — Z556 Problems related to health literacy: Secondary | ICD-10-CM | POA: Diagnosis not present

## 2022-11-30 DIAGNOSIS — I129 Hypertensive chronic kidney disease with stage 1 through stage 4 chronic kidney disease, or unspecified chronic kidney disease: Secondary | ICD-10-CM | POA: Diagnosis not present

## 2022-11-30 DIAGNOSIS — I69351 Hemiplegia and hemiparesis following cerebral infarction affecting right dominant side: Secondary | ICD-10-CM | POA: Diagnosis not present

## 2022-11-30 DIAGNOSIS — Z79899 Other long term (current) drug therapy: Secondary | ICD-10-CM | POA: Diagnosis not present

## 2022-11-30 DIAGNOSIS — Z7901 Long term (current) use of anticoagulants: Secondary | ICD-10-CM | POA: Diagnosis not present

## 2022-11-30 DIAGNOSIS — I10 Essential (primary) hypertension: Secondary | ICD-10-CM | POA: Diagnosis not present

## 2022-11-30 DIAGNOSIS — I69354 Hemiplegia and hemiparesis following cerebral infarction affecting left non-dominant side: Secondary | ICD-10-CM | POA: Diagnosis not present

## 2022-11-30 DIAGNOSIS — S71002D Unspecified open wound, left hip, subsequent encounter: Secondary | ICD-10-CM | POA: Diagnosis not present

## 2022-11-30 DIAGNOSIS — M8580 Other specified disorders of bone density and structure, unspecified site: Secondary | ICD-10-CM | POA: Diagnosis not present

## 2022-11-30 DIAGNOSIS — F02A0 Dementia in other diseases classified elsewhere, mild, without behavioral disturbance, psychotic disturbance, mood disturbance, and anxiety: Secondary | ICD-10-CM | POA: Diagnosis not present

## 2022-12-07 DIAGNOSIS — K219 Gastro-esophageal reflux disease without esophagitis: Secondary | ICD-10-CM | POA: Diagnosis not present

## 2022-12-07 DIAGNOSIS — M109 Gout, unspecified: Secondary | ICD-10-CM | POA: Diagnosis not present

## 2022-12-07 DIAGNOSIS — G40909 Epilepsy, unspecified, not intractable, without status epilepticus: Secondary | ICD-10-CM | POA: Diagnosis not present

## 2022-12-07 DIAGNOSIS — Z7901 Long term (current) use of anticoagulants: Secondary | ICD-10-CM | POA: Diagnosis not present

## 2022-12-07 DIAGNOSIS — N183 Chronic kidney disease, stage 3 unspecified: Secondary | ICD-10-CM | POA: Diagnosis not present

## 2022-12-07 DIAGNOSIS — F02A0 Dementia in other diseases classified elsewhere, mild, without behavioral disturbance, psychotic disturbance, mood disturbance, and anxiety: Secondary | ICD-10-CM | POA: Diagnosis not present

## 2022-12-07 DIAGNOSIS — M8580 Other specified disorders of bone density and structure, unspecified site: Secondary | ICD-10-CM | POA: Diagnosis not present

## 2022-12-07 DIAGNOSIS — S71002D Unspecified open wound, left hip, subsequent encounter: Secondary | ICD-10-CM | POA: Diagnosis not present

## 2022-12-07 DIAGNOSIS — I872 Venous insufficiency (chronic) (peripheral): Secondary | ICD-10-CM | POA: Diagnosis not present

## 2022-12-07 DIAGNOSIS — E785 Hyperlipidemia, unspecified: Secondary | ICD-10-CM | POA: Diagnosis not present

## 2022-12-07 DIAGNOSIS — I129 Hypertensive chronic kidney disease with stage 1 through stage 4 chronic kidney disease, or unspecified chronic kidney disease: Secondary | ICD-10-CM | POA: Diagnosis not present

## 2022-12-07 DIAGNOSIS — I69351 Hemiplegia and hemiparesis following cerebral infarction affecting right dominant side: Secondary | ICD-10-CM | POA: Diagnosis not present

## 2022-12-07 DIAGNOSIS — I69354 Hemiplegia and hemiparesis following cerebral infarction affecting left non-dominant side: Secondary | ICD-10-CM | POA: Diagnosis not present

## 2022-12-07 DIAGNOSIS — Z556 Problems related to health literacy: Secondary | ICD-10-CM | POA: Diagnosis not present

## 2022-12-07 DIAGNOSIS — Z79899 Other long term (current) drug therapy: Secondary | ICD-10-CM | POA: Diagnosis not present

## 2022-12-16 DIAGNOSIS — M109 Gout, unspecified: Secondary | ICD-10-CM | POA: Diagnosis not present

## 2022-12-16 DIAGNOSIS — I69351 Hemiplegia and hemiparesis following cerebral infarction affecting right dominant side: Secondary | ICD-10-CM | POA: Diagnosis not present

## 2022-12-16 DIAGNOSIS — Z79899 Other long term (current) drug therapy: Secondary | ICD-10-CM | POA: Diagnosis not present

## 2022-12-16 DIAGNOSIS — I872 Venous insufficiency (chronic) (peripheral): Secondary | ICD-10-CM | POA: Diagnosis not present

## 2022-12-16 DIAGNOSIS — M8580 Other specified disorders of bone density and structure, unspecified site: Secondary | ICD-10-CM | POA: Diagnosis not present

## 2022-12-16 DIAGNOSIS — Z556 Problems related to health literacy: Secondary | ICD-10-CM | POA: Diagnosis not present

## 2022-12-16 DIAGNOSIS — G40909 Epilepsy, unspecified, not intractable, without status epilepticus: Secondary | ICD-10-CM | POA: Diagnosis not present

## 2022-12-16 DIAGNOSIS — I129 Hypertensive chronic kidney disease with stage 1 through stage 4 chronic kidney disease, or unspecified chronic kidney disease: Secondary | ICD-10-CM | POA: Diagnosis not present

## 2022-12-16 DIAGNOSIS — Z7901 Long term (current) use of anticoagulants: Secondary | ICD-10-CM | POA: Diagnosis not present

## 2022-12-16 DIAGNOSIS — S71002D Unspecified open wound, left hip, subsequent encounter: Secondary | ICD-10-CM | POA: Diagnosis not present

## 2022-12-16 DIAGNOSIS — K219 Gastro-esophageal reflux disease without esophagitis: Secondary | ICD-10-CM | POA: Diagnosis not present

## 2022-12-16 DIAGNOSIS — N183 Chronic kidney disease, stage 3 unspecified: Secondary | ICD-10-CM | POA: Diagnosis not present

## 2022-12-16 DIAGNOSIS — I69354 Hemiplegia and hemiparesis following cerebral infarction affecting left non-dominant side: Secondary | ICD-10-CM | POA: Diagnosis not present

## 2022-12-16 DIAGNOSIS — E785 Hyperlipidemia, unspecified: Secondary | ICD-10-CM | POA: Diagnosis not present

## 2022-12-16 DIAGNOSIS — F02A0 Dementia in other diseases classified elsewhere, mild, without behavioral disturbance, psychotic disturbance, mood disturbance, and anxiety: Secondary | ICD-10-CM | POA: Diagnosis not present

## 2022-12-21 ENCOUNTER — Other Ambulatory Visit: Payer: Self-pay | Admitting: Neurology

## 2022-12-22 DIAGNOSIS — Z79899 Other long term (current) drug therapy: Secondary | ICD-10-CM | POA: Diagnosis not present

## 2022-12-22 DIAGNOSIS — S71002D Unspecified open wound, left hip, subsequent encounter: Secondary | ICD-10-CM | POA: Diagnosis not present

## 2022-12-22 DIAGNOSIS — E785 Hyperlipidemia, unspecified: Secondary | ICD-10-CM | POA: Diagnosis not present

## 2022-12-22 DIAGNOSIS — K219 Gastro-esophageal reflux disease without esophagitis: Secondary | ICD-10-CM | POA: Diagnosis not present

## 2022-12-22 DIAGNOSIS — N183 Chronic kidney disease, stage 3 unspecified: Secondary | ICD-10-CM | POA: Diagnosis not present

## 2022-12-22 DIAGNOSIS — Z556 Problems related to health literacy: Secondary | ICD-10-CM | POA: Diagnosis not present

## 2022-12-22 DIAGNOSIS — I69354 Hemiplegia and hemiparesis following cerebral infarction affecting left non-dominant side: Secondary | ICD-10-CM | POA: Diagnosis not present

## 2022-12-22 DIAGNOSIS — Z7901 Long term (current) use of anticoagulants: Secondary | ICD-10-CM | POA: Diagnosis not present

## 2022-12-22 DIAGNOSIS — I129 Hypertensive chronic kidney disease with stage 1 through stage 4 chronic kidney disease, or unspecified chronic kidney disease: Secondary | ICD-10-CM | POA: Diagnosis not present

## 2022-12-22 DIAGNOSIS — F02A0 Dementia in other diseases classified elsewhere, mild, without behavioral disturbance, psychotic disturbance, mood disturbance, and anxiety: Secondary | ICD-10-CM | POA: Diagnosis not present

## 2022-12-22 DIAGNOSIS — I872 Venous insufficiency (chronic) (peripheral): Secondary | ICD-10-CM | POA: Diagnosis not present

## 2022-12-22 DIAGNOSIS — I69351 Hemiplegia and hemiparesis following cerebral infarction affecting right dominant side: Secondary | ICD-10-CM | POA: Diagnosis not present

## 2022-12-22 DIAGNOSIS — M8580 Other specified disorders of bone density and structure, unspecified site: Secondary | ICD-10-CM | POA: Diagnosis not present

## 2022-12-22 DIAGNOSIS — I10 Essential (primary) hypertension: Secondary | ICD-10-CM | POA: Diagnosis not present

## 2022-12-22 DIAGNOSIS — M109 Gout, unspecified: Secondary | ICD-10-CM | POA: Diagnosis not present

## 2022-12-22 DIAGNOSIS — G40909 Epilepsy, unspecified, not intractable, without status epilepticus: Secondary | ICD-10-CM | POA: Diagnosis not present

## 2022-12-28 IMAGING — CT CT HEAD CODE STROKE
2 of 4 series · 13 of 47 positions shown, 16 images · non-contrast
Comparison: Prior head CT examinations 05/30/2020 and earlier.
Brain MRI 01/25/2017.

CLINICAL DATA: Code stroke. Neuro deficit, acute, stroke suspected.
Left-sided gaze.



[Series 5: head 5.0 ax st · axial · 0.48mm/px · z∈[-131,+21]mm · 10 of 41 slices shown, 13 images]
[im 3/41  brain]
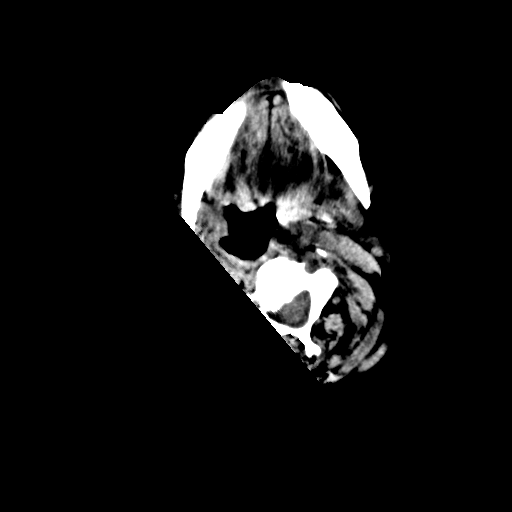
[im 3/41  bone]
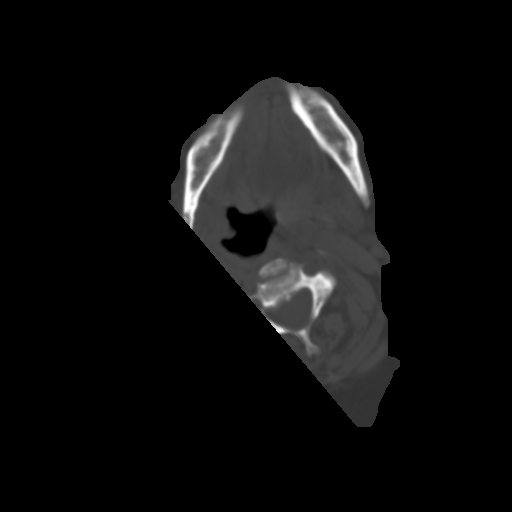
[im 9/41  brain]
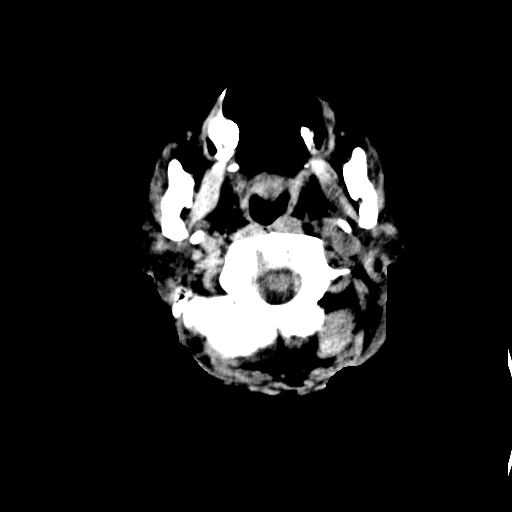
[im 11/41  brain]
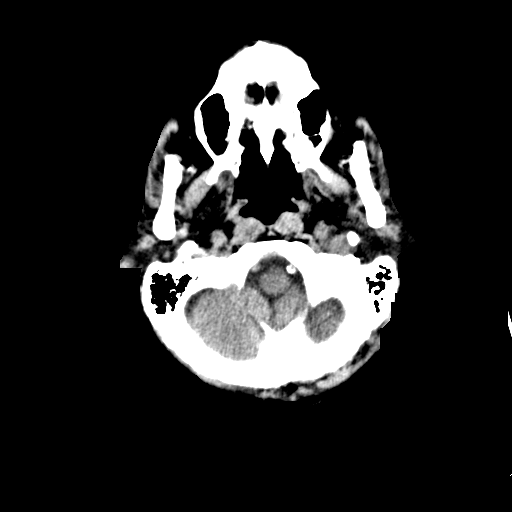
[im 14/41  brain]
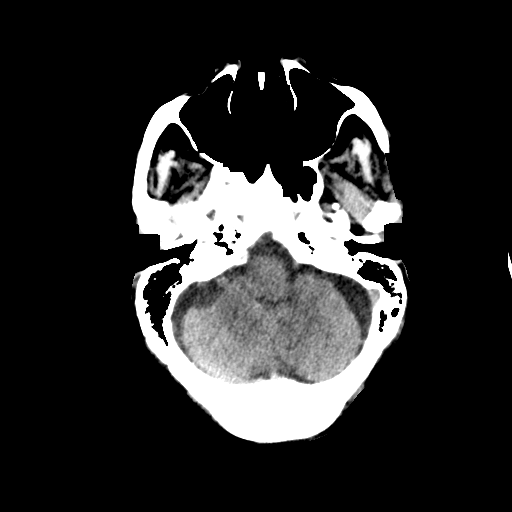
[im 19/41  brain]
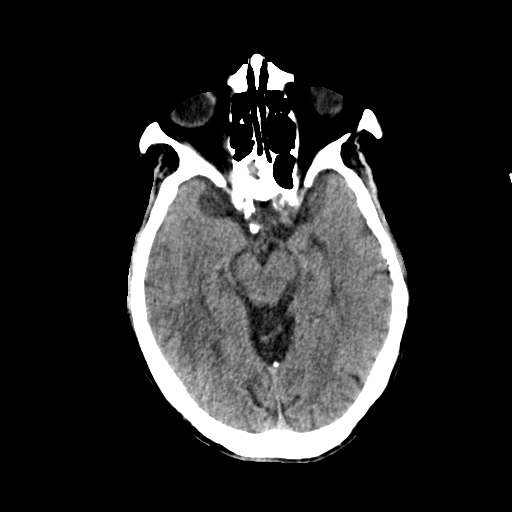
[im 19/41  bone]
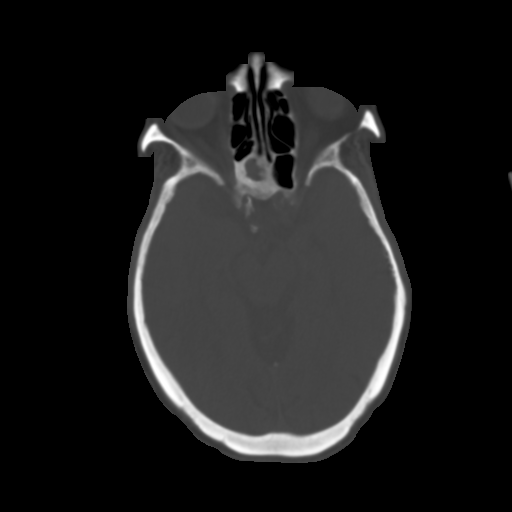
[im 22/41  brain]
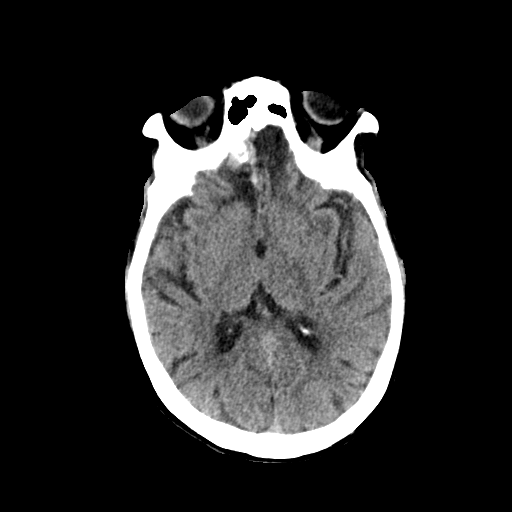
[im 27/41  brain]
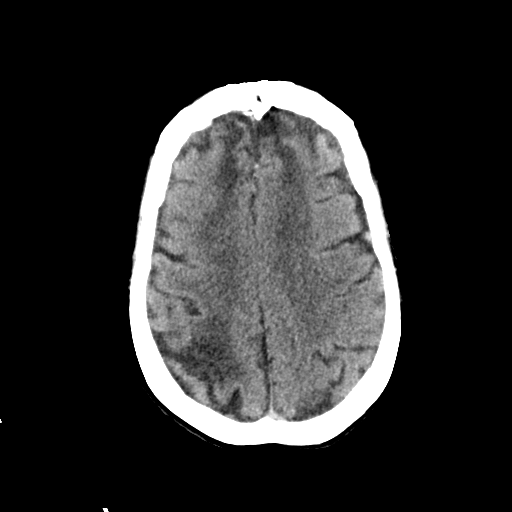
[im 30/41  brain]
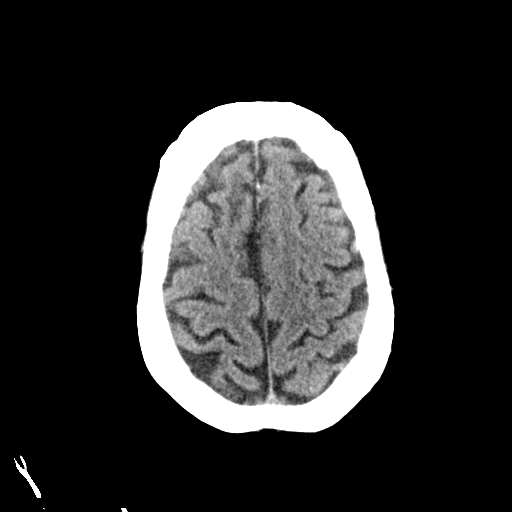
[im 33/41  brain]
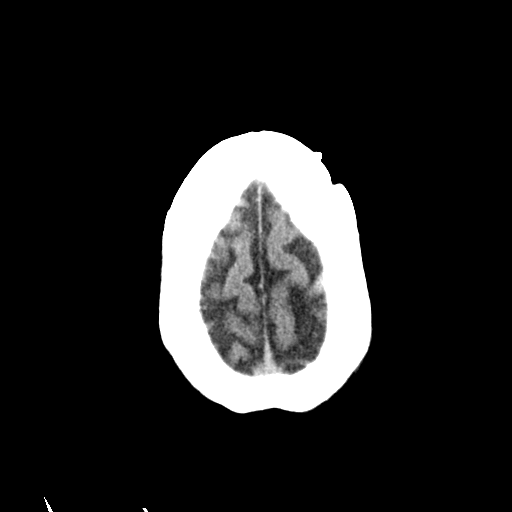
[im 33/41  bone]
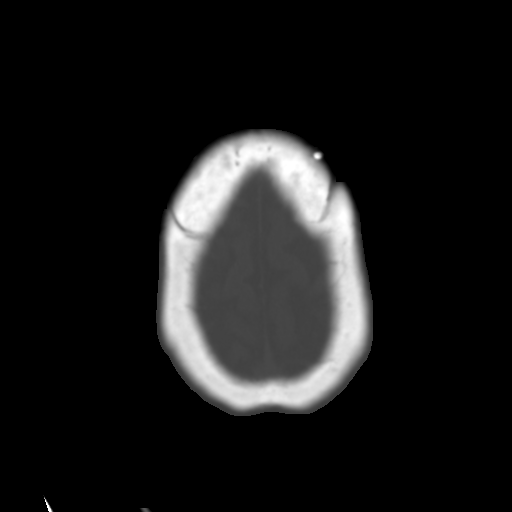
[im 38/41  brain]
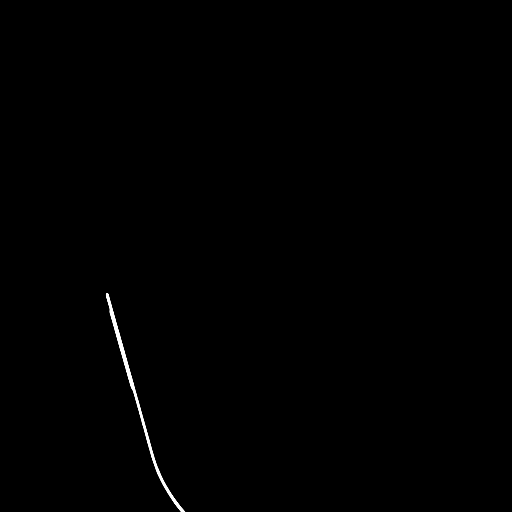

[Series 6: head 3.0 cor st · coronal · 0.32mm/px · 3 of 77 slices shown]
[im 26/77  brain]
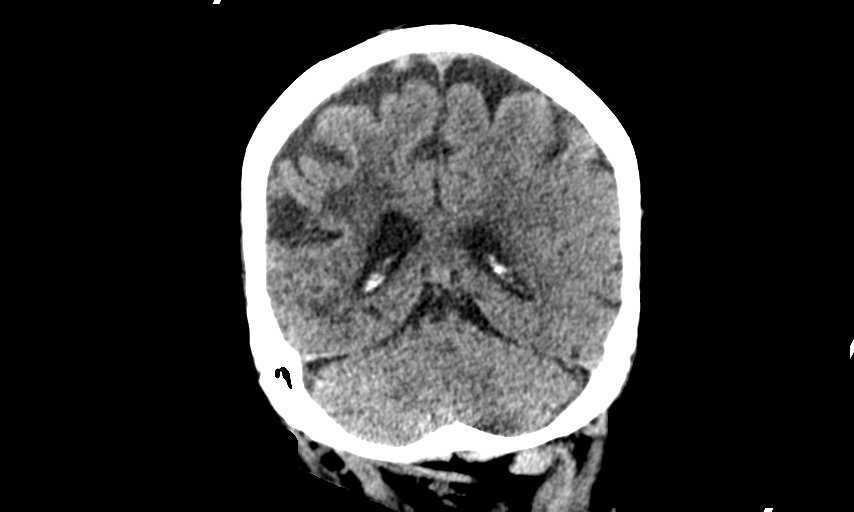
[im 34/77  brain]
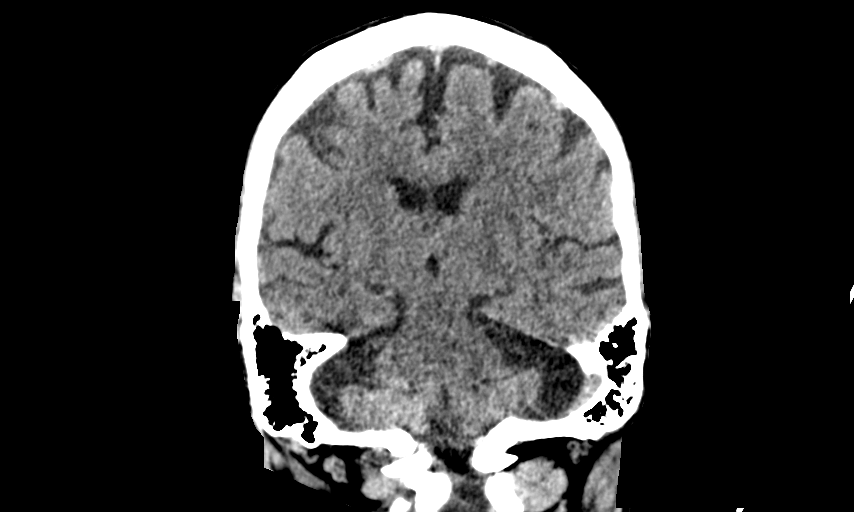
[im 43/77  brain]
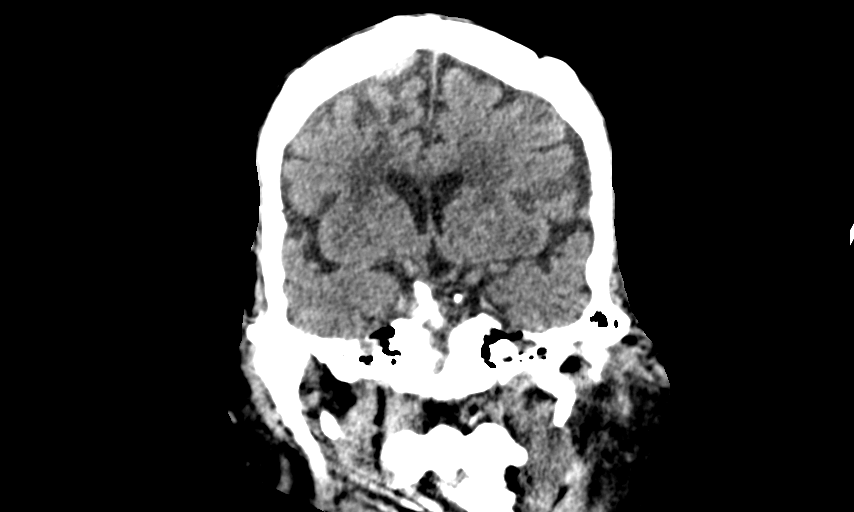

[13 of 47 positions shown; findings below may reference images not displayed]

FINDINGS: Brain:

Mild generalized cerebral atrophy.

Redemonstrated foci of chronic encephalomalacia/gliosis within the
anteroinferior frontal lobes, bilaterally. As before, there is
irregular hyperdense and partially calcified material along the
anteroinferior right frontal lobe, which may reflect sequela of the
reported prior skull base cephalocele repair.

Redemonstrated moderate-sized chronic cortical/subcortical infarct
within the right parietal lobe.

Background moderate to advanced patchy and ill-defined
hypoattenuation within the cerebral white matter, nonspecific but
compatible with chronic small vessel ischemic disease.

Known small chronic infarcts within the left cerebellar hemisphere,
better appreciated on the prior brain MRI of 01/25/2017.

There is no acute intracranial hemorrhage.

No acute demarcated cortical infarct.

No extra-axial fluid collection.

No evidence of an intracranial mass.

No midline shift.

Vascular: No hyperdense vessel.  Atherosclerotic calcifications.

Skull: Right frontoparietal cranioplasty. No fracture or aggressive
osseous lesion.

Sinuses/Orbits: No mass or acute finding within the imaged orbits.
Postsurgical appearance of the paranasal sinuses. Small mucous
retention cyst within the right maxillary sinus. Mild mucosal
thickening within the right sphenoid sinus with associated chronic
reactive osteitis. No significant mastoid effusion.

ASPECTS (Alberta Stroke Program Early CT Score)

- Ganglionic level infarction (caudate, lentiform nuclei, internal
capsule, insula, M1-M3 cortex): 7

- Supraganglionic infarction (M4-M6 cortex): 3

Total score (0-10 with 10 being normal): 10 (when discounting
chronic infarcts).

These results were communicated to Dr. Shon at [DATE] Samb[facility]
09/05/2021by text page via the [employer] messaging system.
IMPRESSION: 1. No evidence of acute intracranial abnormality.
2. Stable non-contrast CT appearance of the brain as compared to
05/30/2020.
3. Foci of chronic encephalomalacia/gliosis within the
anteroinferior frontal lobes, bilaterally.
4. Moderate-sized chronic right MCA territory infarct within the
right parietal lobe.
5. Background moderate/advanced cerebral white matter chronic small
vessel ischemic disease.
6. Known small chronic infarcts within the left cerebellar
hemisphere
7. Mild generalized cerebral atrophy.
8. Right frontoparietal cranioplasty.
9. Paranasal sinus disease, as described.

## 2022-12-28 IMAGING — DX DG CHEST 1V PORT
1 series · 1 of 1 positions shown · non-contrast
Comparison: Previous studies including the examination of
05/30/2020

CLINICAL DATA: Altered mental status

EXAM:
PORTABLE CHEST 1 VIEW

[chest]
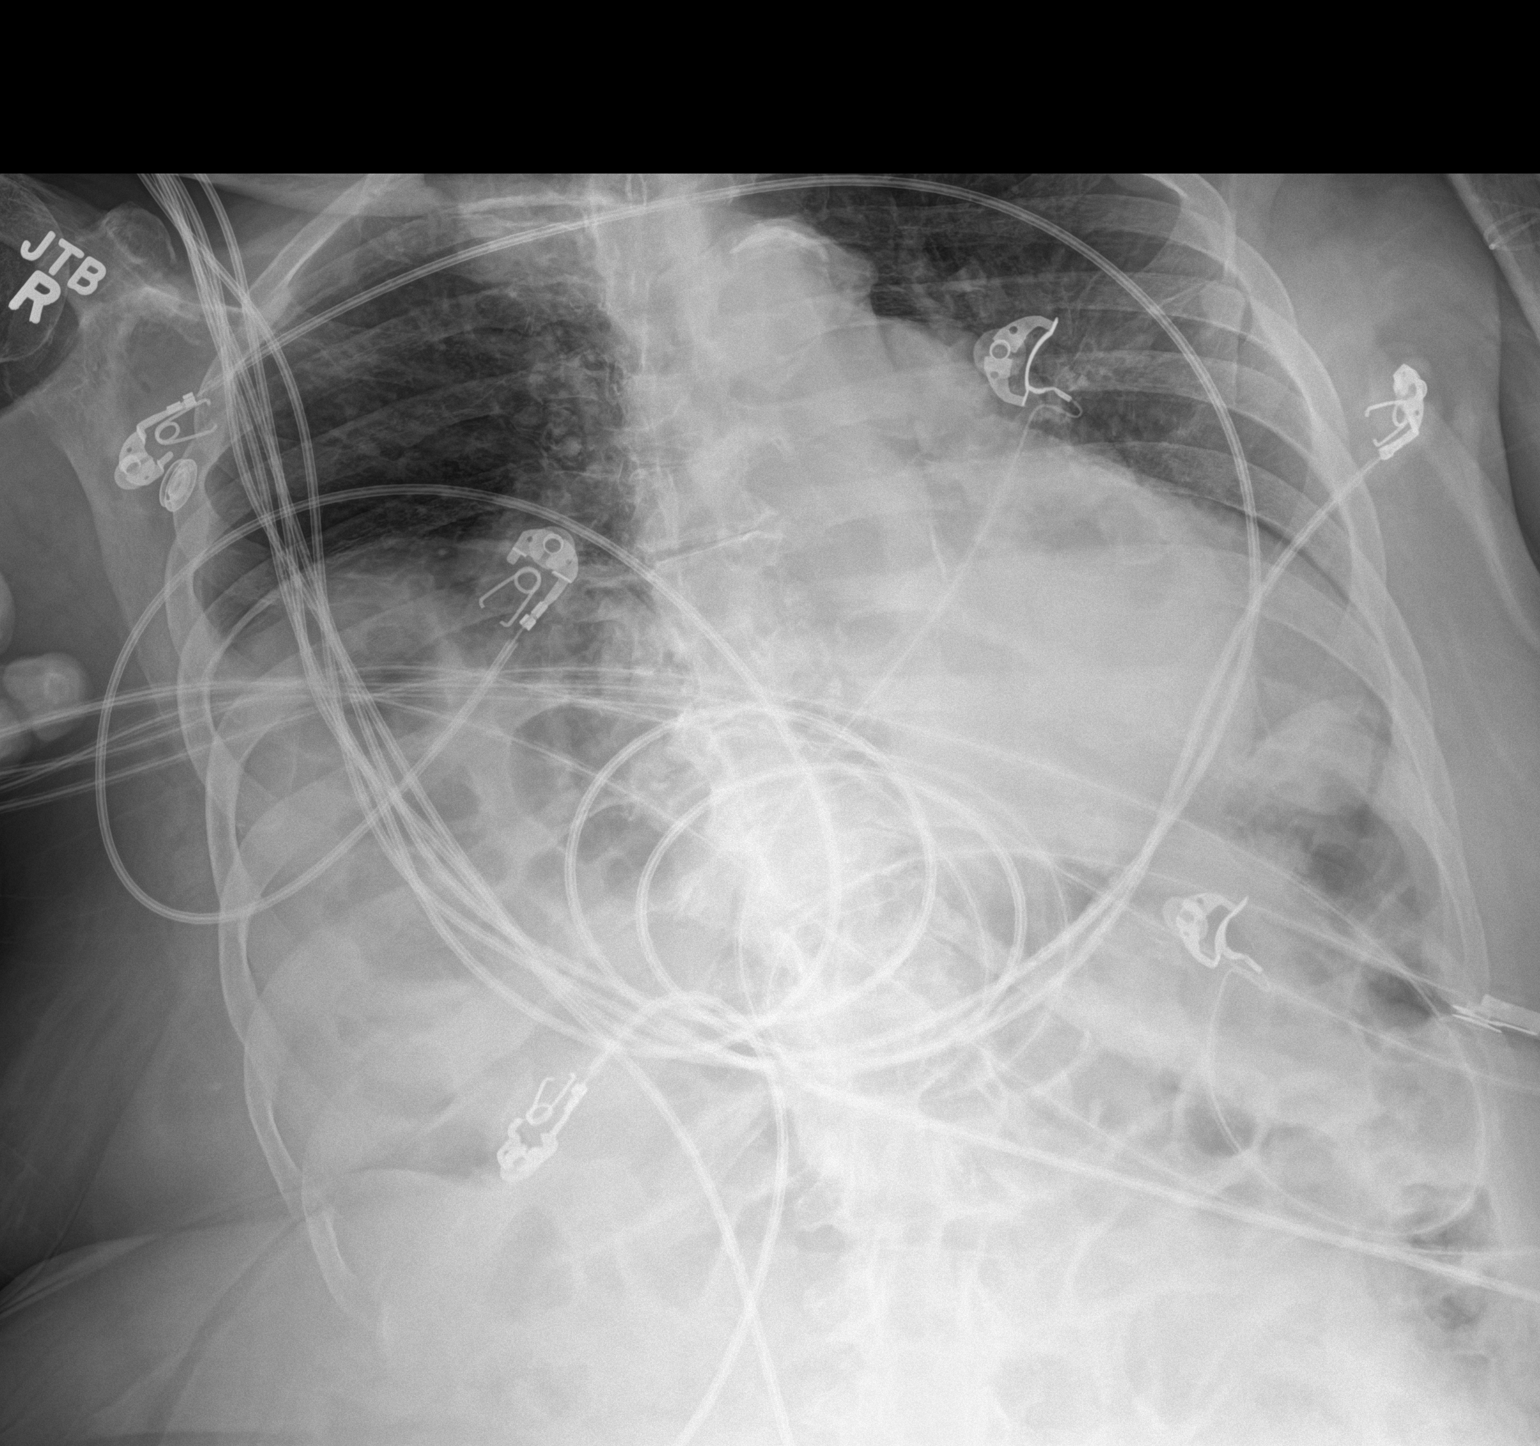

[1 of 1 positions shown; findings below may reference images not displayed]

FINDINGS: Transverse diameter of heart is increased. Large hiatal hernia is
seen in the retrocardiac region. There is increased density in the
left lower lung fields. There is marked elevation of right
hemidiaphragm. There are no signs of alveolar pulmonary edema.
Evaluation of lower lung fields for infiltrates is limited by large
hiatal hernia and elevation of right hemidiaphragm. There is no
significant pleural effusion or pneumothorax.
IMPRESSION: Cardiomegaly. Increased density in the left lower lung fields most
likely is due to fluid-filled stomach. Possibility of new infiltrate
in the left lower lung fields is not excluded.

## 2023-01-01 DIAGNOSIS — F0390 Unspecified dementia without behavioral disturbance: Secondary | ICD-10-CM | POA: Diagnosis not present

## 2023-01-19 DIAGNOSIS — I10 Essential (primary) hypertension: Secondary | ICD-10-CM | POA: Diagnosis not present

## 2023-01-19 DIAGNOSIS — Z23 Encounter for immunization: Secondary | ICD-10-CM | POA: Diagnosis not present

## 2023-01-19 DIAGNOSIS — Z8673 Personal history of transient ischemic attack (TIA), and cerebral infarction without residual deficits: Secondary | ICD-10-CM | POA: Diagnosis not present

## 2023-01-19 DIAGNOSIS — F0390 Unspecified dementia without behavioral disturbance: Secondary | ICD-10-CM | POA: Diagnosis not present

## 2023-02-09 DIAGNOSIS — F0390 Unspecified dementia without behavioral disturbance: Secondary | ICD-10-CM | POA: Diagnosis not present

## 2023-03-01 DIAGNOSIS — F0390 Unspecified dementia without behavioral disturbance: Secondary | ICD-10-CM | POA: Diagnosis not present

## 2023-03-02 DIAGNOSIS — I10 Essential (primary) hypertension: Secondary | ICD-10-CM | POA: Diagnosis not present

## 2023-03-02 DIAGNOSIS — F0390 Unspecified dementia without behavioral disturbance: Secondary | ICD-10-CM | POA: Diagnosis not present

## 2023-03-02 DIAGNOSIS — Z23 Encounter for immunization: Secondary | ICD-10-CM | POA: Diagnosis not present

## 2023-05-03 DIAGNOSIS — F0390 Unspecified dementia without behavioral disturbance: Secondary | ICD-10-CM | POA: Diagnosis not present

## 2023-05-04 DIAGNOSIS — F03A0 Unspecified dementia, mild, without behavioral disturbance, psychotic disturbance, mood disturbance, and anxiety: Secondary | ICD-10-CM | POA: Diagnosis not present

## 2023-07-01 DIAGNOSIS — F0390 Unspecified dementia without behavioral disturbance: Secondary | ICD-10-CM | POA: Diagnosis not present

## 2023-07-01 DIAGNOSIS — F03A0 Unspecified dementia, mild, without behavioral disturbance, psychotic disturbance, mood disturbance, and anxiety: Secondary | ICD-10-CM | POA: Diagnosis not present

## 2023-07-01 DIAGNOSIS — I129 Hypertensive chronic kidney disease with stage 1 through stage 4 chronic kidney disease, or unspecified chronic kidney disease: Secondary | ICD-10-CM | POA: Diagnosis not present

## 2023-08-02 ENCOUNTER — Other Ambulatory Visit: Payer: Self-pay | Admitting: Neurology

## 2023-08-06 DIAGNOSIS — I10 Essential (primary) hypertension: Secondary | ICD-10-CM | POA: Diagnosis not present

## 2023-08-17 ENCOUNTER — Encounter: Payer: Self-pay | Admitting: Neurology

## 2023-08-17 ENCOUNTER — Telehealth: Payer: Medicare Other | Admitting: Neurology

## 2023-08-17 VITALS — Ht 61.0 in | Wt 175.0 lb

## 2023-08-17 DIAGNOSIS — G40109 Localization-related (focal) (partial) symptomatic epilepsy and epileptic syndromes with simple partial seizures, not intractable, without status epilepticus: Secondary | ICD-10-CM

## 2023-08-17 DIAGNOSIS — F01C3 Vascular dementia, severe, with mood disturbance: Secondary | ICD-10-CM

## 2023-08-17 MED ORDER — QUETIAPINE FUMARATE 25 MG PO TABS: 25.0000 mg | ORAL_TABLET | Freq: Every day | ORAL | 3 refills | Status: AC

## 2023-08-17 MED ORDER — DONEPEZIL HCL 10 MG PO TABS: 10.0000 mg | ORAL_TABLET | Freq: Every day | ORAL | 3 refills | Status: AC

## 2023-08-17 MED ORDER — TRAZODONE HCL 50 MG PO TABS: 50.0000 mg | ORAL_TABLET | Freq: Every day | ORAL | 3 refills | Status: AC

## 2023-08-17 MED ORDER — LAMOTRIGINE 25 MG PO TABS: 50.0000 mg | ORAL_TABLET | Freq: Two times a day (BID) | ORAL | 3 refills | Status: AC

## 2023-08-17 NOTE — Progress Notes (Signed)
 Virtual Visit via Video Note The purpose of this virtual visit is to provide medical care while limiting exposure to the novel coronavirus.    Consent was obtained for video visit:  Yes.   Answered questions that patient had about telehealth interaction:  Yes.    Pt location: Home Physician Location: office Name of referring provider:  [facility],* I connected with Heather [name] at patients initiation/request on 08/17/2023 at 10:00 AM EDT by video enabled telemedicine application and verified that I am speaking with the correct person using two identifiers. Pt MRN:  [id number] Pt DOB:  11/29/1928 Video Participants:  Heather [name];  Heather [name] (daughter)   History of Present Illness:  The patient had a virtual video visit on 08/17/2023. Her daughter Heather [name] again provides information due to patient's dementia. Heather [name] is bedbound and receiving excellent care at home. Heather [name] reports she has been stable with no seizures since May 2023 on Lamotrigine  50mg  BID, no side effects. She needs assistance with all ADLs and has a nurse coming monthly. Appetite is great, no swallowing difficulties. Her temperament is pretty even-keel, she gives standard responses when visitors come. From time to time Heather [name] questions her vision, sometimes she recognizes family members. No paranoia or hallucinations, she has a lot of picking behaviors and "nervous dancing feet." She is on Donepezil  10mg  daily and Seroquel  25mg  at bedtime. Sleep is okay for the most part, she has Trazodone  50mg  at bedtime to help. If Heather [name] keeps her day regulated, she is okay at night. She has some congestion and Heather [name] will be giving Mucinex , she has not needed a breathing treatment in 4 months. No falls.   History on Initial Assessment 03/09/2016: This is an 89 yo RH woman with a history of hypertension, hyperlipidemia, encephalocele s/p surgery in 2003, with memory loss and stroke in October 2017. She had seen her PCP on 01/01/87  for memory problems. An MRI brain was ordered, however while waiting for the scan, she started having shortness of breath and was admitted 01/24/16 and found to have DVTs in both lower extremities. She was also complaining of generalized weakness with difficulty ambulating. She was discharged home on Xarelto  and home PT. She went for outpatient previously scheduled MRI brain on 02/03/16, which I personally reviewed, showing an acute/subacute small left periatrial infarct without associated hemorrhage. There was a remote moderate-sized right parietal lobe infarct. There was marked chronic microvascular change. Remote frontal craniotomy for repair of anterior encephalocele was seen, with significant surrounding encephalomalacia. There was moderate global atrophy without hydrocephalus. She was instructed to go to the ER, but since she had recent echocardiogram and was started on Xarelto , and had no clear deficits on exam, and she was discharged home with outpatient follow-up.    She reports that her memory has been "on and off" for conversations. She can hold a conversation then just "slacks off." Her daughter has noticed this and feels that she loses interest, cutting off conversations. She endorses some word-finding difficulties. Her daughter states that sometimes it is hard to say about her memory because of her "quirky sense of humor." For instance, this morning she said she did not want breakfast. After her daughter came home, she told her she was hungry, so her daughter fixed food. Once made, she stated she did not want food. She only repeats herself when they are having conversations in the bathroom at 3am. She had previously been living with her husband until hospital admission, and has  been living with her daughter for the past 2 months. She denied any missed bill payments when she was living with her husband. She is pretty good with remembering to take her morning medications. She stopped driving due to  mobility issues 3 years ago. She has been using a wheelchair since recent hospital discharge, however her daughter reports that for the past 2 years, she has not walked a lot because of bilateral leg weakness. She would use a walker and hold on to furniture. She feels her left arm and leg are weaker. She has occasional tingling in both hands. She needs assistance with dressing and bathing. Her daughter reports a couple of falls in the past 2 years, she would tell her daughter about them 1-2 weeks later. She denies any prior history of stroke. She denies any headaches, dizziness, diplopia, dysarthria/dysphagia. No personality changes or hallucinations. She is having some constipation, but her bigger issue is urinary urgency. She has the urge to urinate 4 to 6 times an hour at night, but once she sits on the commode, she does not urinate. During the day, she has the urge around 3 times an hour.   Diagnostic Data:   She was hospitalized in May 2023 for somnolence with left gaze deviation. EEG showed lateralized periodic discharges with overriding fast activity (LPD+F) on the right hemisphere, diffuse slowing and additional right hemisphere slowing.   Head CT no acute changes, there were foci of chronic encephalomalacia/gliosis within the anteroinferior frontal lobes bilaterally, irregular hyperdense and partially calcified material along the anteroinferior right frontal lobe, which may reflec sequela of the reported prior skull base cephalocele repair.     Current Outpatient Medications on File Prior to Visit  Medication Sig Dispense Refill   amLODipine  (NORVASC ) 10 MG tablet Take 10 mg by mouth daily.     Dextromethorphan-guaiFENesin  (MUCINEX  DM) 30-600 MG TB12 Take 1 tablet by mouth every 12 (twelve) hours as needed for cough or congestion.     donepezil  (ARICEPT ) 10 MG tablet TAKE 1 TABLET BY MOUTH ONCE DAILY 30 tablet 1   EQ ALLERGY RELIEF, CETIRIZINE , 10 MG tablet Take 1 tablet by mouth once daily  (Patient taking differently: Take 10 mg by mouth daily.) 90 tablet 3   lamoTRIgine  (LAMICTAL ) 25 MG tablet TAKE 2 TABLETS BY MOUTH TWICE DAILY 120 tablet 1   loperamide (IMODIUM) 2 MG capsule Take 2 mg by mouth daily as needed for diarrhea or loose stools.     montelukast  (SINGULAIR ) 10 MG tablet Take 1 tablet by mouth once daily (Patient taking differently: Take 10 mg by mouth at bedtime.) 90 tablet 3   pantoprazole  (PROTONIX ) 40 MG tablet Take 1 tablet (40 mg total) by mouth 2 (two) times daily. (Patient taking differently: Take 1 tablet by mouth daily.) 90 tablet 0   QUEtiapine  (SEROQUEL ) 25 MG tablet TAKE 1 TABLET BY MOUTH AT BEDTIME 30 tablet 1   traZODone  (DESYREL ) 50 MG tablet TAKE 1 TABLET BY MOUTH AT BEDTIME 30 tablet 1   XARELTO  10 MG TABS tablet Take 10 mg by mouth at bedtime.     [DISCONTINUED] apixaban  (ELIQUIS ) 5 MG TABS tablet Take 1 tablet (5 mg total) by mouth 2 (two) times daily. (Patient not taking: Reported on 04/03/2020) 60 tablet 11   [DISCONTINUED] levothyroxine  (SYNTHROID , LEVOTHROID) 25 MCG tablet Take 0.5 tablets (12.5 mcg total) by mouth daily before breakfast. (Patient not taking: Reported on 04/03/2020) 30 tablet 0   No current facility-administered medications on file prior to  visit.     Observations/Objective:   Vitals:   08/17/23 0908  Weight: 175 lb (79.4 kg)  Height: 5\' 1"  (1.549 m)   GEN:  The patient appears stated age and is in NAD, lying in bed. Neurological examination: Patient is awake, alert, she waves and says "hi, I'm good" when asked. No aphasia or dysarthria. Able to follow simple instructions, needs repeated instructions to touch nose with left hand. Cranial nerves: ENo facial asymmetry. Motor: moves all extremities symmetrically, at least anti-gravity x 4. No incoordination on finger to nose testing.    Assessment and Plan:   This is a 89 yo RH woman with a history of hypertension, hyperlipidemia, right MCA stroke, s/p anterior encephalocele  surgery, severe dementia with new onset seizure in May 2023. EEG showed lateralized periodic discharges over the right hemisphere. No further seizures since 08/2021 on Lamotrigine  50mg  BID. She is bedbound and requires total care for her dementia with excellent care at home. Continue Donepezil  10mg  daily, Quetiapine  25mg  daily, and Trazodone  50mg  qhs. Continue 24/7 care. Discussed need for in-person visit on next appointment as per office policy, this would be very difficult for them as she is bedbound. Heather [name] will discuss with PCP and request continued refills from PCP moving forward. I sent refills until they get this established. Follow-up as needed, they know to call for any changes.    Follow Up Instructions:    -I discussed the assessment and treatment plan with the patient's daughter. The patient's daughter was provided an opportunity to ask questions and all were answered. The patient's daughter agreed with the plan and demonstrated an understanding of the instructions.   The patient's daughter was advised to call back or seek an in-person evaluation if the symptoms worsen or if the condition fails to improve as anticipated.    Jhonny Moss, MD

## 2023-08-17 NOTE — Patient Instructions (Signed)
 Good to see you. Continue all medications, I sent refills for all your medications. Unfortunately, office policy now requires in-person visit once a year for continued refills. Please discuss with PCP re: taking over medication refills. Follow-up as needed, please call for any changes. I wish you all the best!

## 2023-08-24 DIAGNOSIS — J069 Acute upper respiratory infection, unspecified: Secondary | ICD-10-CM | POA: Diagnosis not present

## 2023-09-01 DIAGNOSIS — F0390 Unspecified dementia without behavioral disturbance: Secondary | ICD-10-CM | POA: Diagnosis not present

## 2023-09-01 DIAGNOSIS — I10 Essential (primary) hypertension: Secondary | ICD-10-CM | POA: Diagnosis not present

## 2023-09-27 DIAGNOSIS — F0390 Unspecified dementia without behavioral disturbance: Secondary | ICD-10-CM | POA: Diagnosis not present

## 2023-09-27 DIAGNOSIS — I10 Essential (primary) hypertension: Secondary | ICD-10-CM | POA: Diagnosis not present

## 2023-09-28 DIAGNOSIS — J9611 Chronic respiratory failure with hypoxia: Secondary | ICD-10-CM | POA: Diagnosis not present

## 2023-09-28 DIAGNOSIS — J069 Acute upper respiratory infection, unspecified: Secondary | ICD-10-CM | POA: Diagnosis not present

## 2023-10-19 DIAGNOSIS — Z0000 Encounter for general adult medical examination without abnormal findings: Secondary | ICD-10-CM | POA: Diagnosis not present

## 2023-10-19 DIAGNOSIS — Z1331 Encounter for screening for depression: Secondary | ICD-10-CM | POA: Diagnosis not present

## 2023-11-11 ENCOUNTER — Other Ambulatory Visit: Payer: Self-pay | Admitting: Neurology

## 2023-11-11 DIAGNOSIS — I10 Essential (primary) hypertension: Secondary | ICD-10-CM | POA: Diagnosis not present

## 2023-11-11 NOTE — Telephone Encounter (Signed)
 Last refill 08/17/23 Last OV-08/17/23 Please advise

## 2023-11-25 DIAGNOSIS — F03A0 Unspecified dementia, mild, without behavioral disturbance, psychotic disturbance, mood disturbance, and anxiety: Secondary | ICD-10-CM | POA: Diagnosis not present

## 2023-11-25 DIAGNOSIS — N183 Chronic kidney disease, stage 3 unspecified: Secondary | ICD-10-CM | POA: Diagnosis not present

## 2023-12-06 DIAGNOSIS — I10 Essential (primary) hypertension: Secondary | ICD-10-CM | POA: Diagnosis not present

## 2023-12-11 DIAGNOSIS — R040 Epistaxis: Secondary | ICD-10-CM | POA: Diagnosis not present

## 2024-01-11 DIAGNOSIS — G40909 Epilepsy, unspecified, not intractable, without status epilepticus: Secondary | ICD-10-CM | POA: Diagnosis not present

## 2024-01-20 DIAGNOSIS — M1990 Unspecified osteoarthritis, unspecified site: Secondary | ICD-10-CM | POA: Diagnosis not present

## 2024-01-20 DIAGNOSIS — Z23 Encounter for immunization: Secondary | ICD-10-CM | POA: Diagnosis not present

## 2024-01-20 DIAGNOSIS — G4700 Insomnia, unspecified: Secondary | ICD-10-CM | POA: Diagnosis not present

## 2024-03-13 DEATH — deceased
